# Patient Record
Sex: Female | Born: 1960 | Race: White | Hispanic: No | Marital: Married | Smoking: Never smoker
Health system: Southern US, Community
[De-identification: ages and names within clinical notes are randomized; demographics above are authoritative.]

## PROBLEM LIST (undated history)

## (undated) DIAGNOSIS — C801 Malignant (primary) neoplasm, unspecified: Secondary | ICD-10-CM

---

## 2009-02-18 ENCOUNTER — Emergency Department (HOSPITAL_COMMUNITY): Admission: EM | Admit: 2009-02-18 | Discharge: 2009-02-18 | Payer: Self-pay | Admitting: Emergency Medicine

## 2011-03-15 LAB — DIFFERENTIAL
Basophils Absolute: 0.1 10*3/uL (ref 0.0–0.1)
Basophils Relative: 1 % (ref 0–1)
Eosinophils Absolute: 0 10*3/uL (ref 0.0–0.7)
Eosinophils Relative: 0 % (ref 0–5)
Lymphocytes Relative: 26 % (ref 12–46)
Lymphs Abs: 1.7 10*3/uL (ref 0.7–4.0)
Monocytes Absolute: 0.4 10*3/uL (ref 0.1–1.0)
Monocytes Relative: 6 % (ref 3–12)
Neutro Abs: 4.5 10*3/uL (ref 1.7–7.7)
Neutrophils Relative %: 67 % (ref 43–77)

## 2011-03-15 LAB — BASIC METABOLIC PANEL
BUN: 6 mg/dL (ref 6–23)
CO2: 27 mEq/L (ref 19–32)
Calcium: 8.7 mg/dL (ref 8.4–10.5)
Chloride: 101 mEq/L (ref 96–112)
Creatinine, Ser: 0.71 mg/dL (ref 0.4–1.2)
GFR calc Af Amer: >60 mL/min (ref >60)
GFR calc non Af Amer: >60 mL/min (ref >60)
Glucose, Bld: 84 mg/dL (ref 70–99)
Potassium: 3.4 mEq/L — ABNORMAL LOW (ref 3.5–5.1)
Sodium: 135 mEq/L (ref 135–145)

## 2011-03-15 LAB — CBC
HCT: 25.9 % — ABNORMAL LOW (ref 36.0–46.0)
Hemoglobin: 7.9 g/dL — CL (ref 12.0–15.0)
MCHC: 30.5 g/dL (ref 30.0–36.0)
MCV: 60.2 fL — ABNORMAL LOW (ref 78.0–100.0)
Platelets: 368 10*3/uL (ref 150–400)
RBC: 4.3 MIL/uL (ref 3.87–5.11)
RDW: 21.4 % — ABNORMAL HIGH (ref 11.5–15.5)
WBC: 6.7 10*3/uL (ref 4.0–10.5)

## 2011-03-15 LAB — POCT CARDIAC MARKERS
CKMB, poc: 1.3 ng/mL (ref 1.0–8.0)
Myoglobin, poc: 85.3 ng/mL (ref 12–200)
Troponin i, poc: <0.05 ng/mL (ref 0.00–0.09)

## 2013-08-24 ENCOUNTER — Emergency Department (HOSPITAL_COMMUNITY)
Admission: EM | Admit: 2013-08-24 | Discharge: 2013-08-24 | Disposition: A | Discharge status: Home / Self Care | Payer: Self-pay | Source: Home / Self Care

## 2013-08-24 ENCOUNTER — Encounter (HOSPITAL_COMMUNITY): Payer: Self-pay | Admitting: Emergency Medicine

## 2013-08-24 ENCOUNTER — Emergency Department (INDEPENDENT_AMBULATORY_CARE_PROVIDER_SITE_OTHER): Payer: Self-pay

## 2013-08-24 DIAGNOSIS — H8110 Benign paroxysmal vertigo, unspecified ear: Secondary | ICD-10-CM

## 2013-08-24 DIAGNOSIS — J209 Acute bronchitis, unspecified: Secondary | ICD-10-CM

## 2013-08-24 DIAGNOSIS — J180 Bronchopneumonia, unspecified organism: Secondary | ICD-10-CM

## 2013-08-24 MED ORDER — BECLOMETHASONE DIPROPIONATE 80 MCG/ACT IN AERS: INHALATION_SPRAY | RESPIRATORY_TRACT | Status: DC

## 2013-08-24 MED ORDER — MOXIFLOXACIN HCL 400 MG PO TABS: 400.0000 mg | ORAL_TABLET | Freq: Every day | ORAL | Status: DC

## 2013-08-24 MED ORDER — ALBUTEROL SULFATE (5 MG/ML) 0.5% IN NEBU
INHALATION_SOLUTION | RESPIRATORY_TRACT | Status: AC
  Filled 2013-08-24: qty 1

## 2013-08-24 MED ORDER — ALBUTEROL SULFATE HFA 108 (90 BASE) MCG/ACT IN AERS: 1.0000 | INHALATION_SPRAY | Freq: Four times a day (QID) | RESPIRATORY_TRACT | Status: DC | PRN

## 2013-08-24 MED ORDER — ALBUTEROL SULFATE (5 MG/ML) 0.5% IN NEBU
5.0000 mg | INHALATION_SOLUTION | Freq: Once | RESPIRATORY_TRACT | Status: AC
  Administered 2013-08-24: 5 mg via RESPIRATORY_TRACT

## 2013-08-24 MED ORDER — IPRATROPIUM BROMIDE 0.02 % IN SOLN
0.5000 mg | Freq: Once | RESPIRATORY_TRACT | Status: AC
  Administered 2013-08-24: 0.5 mg via RESPIRATORY_TRACT

## 2013-08-24 NOTE — ED Notes (Signed)
C/o cough for three weeks.  Patient says she woke up today with the room spinning which made patient nausea.  Motion sickness medication taken.

## 2013-08-24 NOTE — ED Provider Notes (Signed)
CSN: [id number]     Arrival date & time 08/24/13  1236 History   First MD Initiated Contact with Patient 08/24/13 1515     Chief Complaint  Patient presents with  . Cough   (Consider location/radiation/quality/duration/timing/severity/associated sxs/prior Treatment) HPI Comments: 52 year old female presents with a complaint of cough for 3 weeks. The cough developed shortly after arrival to [place] after spending some time in [place] in August. She states there has been scant yellow sputum. She has mild shortness of breath particularly with exertion. Initially, she states that she had some fatigue and malaise associated with a temperature of 101.5 which was short lived. Her second complaint is that this morning upon awakening she experienced severe vertigo in which the room was spinning. She felt miserable had nausea and vomiting. She was able to take some Bonine from her son which helped and her vertigo has ameliorated.   History reviewed. No pertinent past medical history. No past surgical history on file. No family history on file. History  Substance Use Topics  . Smoking status: Not on file  . Smokeless tobacco: Not on file  . Alcohol Use: Not on file   OB History   Grav Para Term Preterm Abortions TAB SAB Ect Mult Living                 Review of Systems  Constitutional: Positive for activity change and fatigue. Negative for fever.  HENT: Negative for ear pain, congestion, sore throat, rhinorrhea, neck pain, postnasal drip and ear discharge.   Respiratory: Positive for cough and shortness of breath. Negative for wheezing.   Cardiovascular: Negative for chest pain, palpitations and leg swelling.  Gastrointestinal: Positive for nausea and vomiting.       Much of her GI symptoms have abated since arrival to the urgent care.  Genitourinary: Negative.   Skin: Negative for rash.  Neurological: Positive for dizziness and light-headedness. Negative for tremors, seizures,  syncope and speech difficulty.    Allergies  Review of patient's allergies indicates no known allergies.  Home Medications   Current Outpatient Rx  Name  Route  Sig  Dispense  Refill  . albuterol (PROVENTIL HFA;VENTOLIN HFA) 108 (90 BASE) MCG/ACT inhaler   Inhalation   Inhale 1-2 puffs into the lungs every 6 (six) hours as needed for wheezing.   1 Inhaler   0   . beclomethasone (QVAR) 80 MCG/ACT inhaler      INhale One puff into lungs daily   1 Inhaler   0   . moxifloxacin (AVELOX) 400 MG tablet   Oral   Take 1 tablet (400 mg total) by mouth daily.   7 tablet   0    BP 143/96  Pulse 66  Temp(Src) 98.1 F (36.7 C) (Oral)  Resp 18  SpO2 100% Physical Exam  Nursing note and vitals reviewed. Constitutional: She is oriented to person, place, and time. She appears well-developed and well-nourished. No distress.  Does not appear acutely ill. Smiling, energetic speech. No respiratory difficulty or cough during the exam  HENT:  Head: Normocephalic and atraumatic.  Mouth/Throat: Oropharynx is clear and moist. No oropharyngeal exudate.  Bilateral TMs are normal   Eyes: Conjunctivae and EOM are normal. Pupils are equal, round, and reactive to light.  No nystagmus  Neck: Normal range of motion. Neck supple.  Cardiovascular: Normal rate and normal heart sounds.   Pulmonary/Chest: Effort normal. No respiratory distress. She has wheezes.   Diffuse bilateral coarseness and wheezing.  Abdominal:  Soft. There is no tenderness.  Musculoskeletal: Normal range of motion.  Lymphadenopathy:    She has no cervical adenopathy.  Neurological: She is alert and oriented to person, place, and time. No cranial nerve deficit. She exhibits normal muscle tone.  Skin: Skin is warm and dry. No rash noted.  Psychiatric: She has a normal mood and affect.    ED Course  Procedures (including critical care time) Labs Review Labs Reviewed - No data to display Imaging Review Dg Chest 2  View  08/24/2013   CLINICAL DATA:  52 year old female with cough. Recent travel to [place].  EXAM: CHEST  2 VIEW  COMPARISON:  Chest CTA 02/18/2009.  FINDINGS: Focal airspace opacity at the right lung base appears very similar to that depicted on the 02/18/2009 study. Cardiac and mediastinal contours also appear stable, except that the right peritracheal density has diminished. No pneumothorax or pulmonary edema. No pleural effusion or other confluent pulmonary opacity. No acute osseous abnormality identified. Visualized tracheal air column is within normal limits.  IMPRESSION: 1. Focal airspace opacity at the right lung base, very similar to that depicted on 02/18/2009 CTA. Therefore chronic right lung inflammation/organizing pneumonia is possible, but an acute bronchopneumonia cannot be excluded.  2. Right peritracheal density appears decreased from the prior study which demonstrated a right peritracheal cystic lesion. Was there interval treatment of the lesion?  3. Overall, consider followup chest CT (IV contrast preferred) for further evaluation in this setting.  These results will be called to the ordering clinician or representative by the Radiologist Assistant, and communication documented in the PACS Dashboard.   Electronically Signed   By: Augusto Gamble M.D.   On: 08/24/2013 16:18    MDM   1. Bronchospasm with bronchitis, acute   2. Bronchopneumonia   3. BPV (benign positional vertigo)      Albuterol HFA 2 puffs q. 4 hours when necessary cough and wheezing Avelox 400 mg daily for 7 days Qvar 80 mg one inhalation daily Strongly recommended followup with PCP in 2 weeks. Call the above numbers to establish with the practice. For any new symptoms problems or worsening seek medical attention promptly. Per radiologist findings suggest that a chest CT may be indicated based on x-ray findings. We will also need a repeat chest x-ray in 2 weeks to follow progress of acute inflammatory changes  today. Patient is discharged in a stable and improved condition. Post duo neb Her lungs are clear with good air movement and no adventitious sounds.   Hayden Rasmussen, NP 08/24/13 1644

## 2013-08-26 NOTE — ED Provider Notes (Signed)
Medical screening examination/treatment/procedure(s) were performed by resident physician or non-physician practitioner and as supervising physician I was immediately available for consultation/collaboration.   KINDL,JAMES DOUGLAS MD.   James D Kindl, MD 08/26/13 1517 

## 2020-04-23 ENCOUNTER — Encounter (HOSPITAL_COMMUNITY): Payer: Self-pay

## 2020-04-23 ENCOUNTER — Emergency Department (HOSPITAL_COMMUNITY): Payer: 59

## 2020-04-23 ENCOUNTER — Other Ambulatory Visit: Payer: Self-pay

## 2020-04-23 ENCOUNTER — Inpatient Hospital Stay (HOSPITAL_COMMUNITY)
Admission: EM | Admit: 2020-04-23 | Discharge: 2020-04-26 | DRG: 919 | Disposition: A | Discharge status: Home / Self Care | Payer: 59 | Attending: Family Medicine | Admitting: Family Medicine

## 2020-04-23 DIAGNOSIS — T85628A Displacement of other specified internal prosthetic devices, implants and grafts, initial encounter: Secondary | ICD-10-CM | POA: Diagnosis not present

## 2020-04-23 DIAGNOSIS — C7951 Secondary malignant neoplasm of bone: Secondary | ICD-10-CM | POA: Diagnosis present

## 2020-04-23 DIAGNOSIS — J90 Pleural effusion, not elsewhere classified: Secondary | ICD-10-CM

## 2020-04-23 DIAGNOSIS — K759 Inflammatory liver disease, unspecified: Secondary | ICD-10-CM | POA: Diagnosis present

## 2020-04-23 DIAGNOSIS — R0781 Pleurodynia: Secondary | ICD-10-CM

## 2020-04-23 DIAGNOSIS — C3490 Malignant neoplasm of unspecified part of unspecified bronchus or lung: Secondary | ICD-10-CM

## 2020-04-23 DIAGNOSIS — C787 Secondary malignant neoplasm of liver and intrahepatic bile duct: Secondary | ICD-10-CM

## 2020-04-23 DIAGNOSIS — Z7951 Long term (current) use of inhaled steroids: Secondary | ICD-10-CM

## 2020-04-23 DIAGNOSIS — T859XXA Unspecified complication of internal prosthetic device, implant and graft, initial encounter: Secondary | ICD-10-CM | POA: Diagnosis not present

## 2020-04-23 DIAGNOSIS — R109 Unspecified abdominal pain: Secondary | ICD-10-CM | POA: Diagnosis present

## 2020-04-23 DIAGNOSIS — C7931 Secondary malignant neoplasm of brain: Secondary | ICD-10-CM | POA: Diagnosis present

## 2020-04-23 DIAGNOSIS — Z888 Allergy status to other drugs, medicaments and biological substances status: Secondary | ICD-10-CM

## 2020-04-23 DIAGNOSIS — Z79899 Other long term (current) drug therapy: Secondary | ICD-10-CM

## 2020-04-23 DIAGNOSIS — C3491 Malignant neoplasm of unspecified part of right bronchus or lung: Secondary | ICD-10-CM

## 2020-04-23 DIAGNOSIS — E871 Hypo-osmolality and hyponatremia: Secondary | ICD-10-CM | POA: Diagnosis present

## 2020-04-23 DIAGNOSIS — D739 Disease of spleen, unspecified: Secondary | ICD-10-CM | POA: Diagnosis present

## 2020-04-23 DIAGNOSIS — C799 Secondary malignant neoplasm of unspecified site: Secondary | ICD-10-CM | POA: Diagnosis present

## 2020-04-23 DIAGNOSIS — R21 Rash and other nonspecific skin eruption: Secondary | ICD-10-CM | POA: Diagnosis present

## 2020-04-23 DIAGNOSIS — Z20822 Contact with and (suspected) exposure to covid-19: Secondary | ICD-10-CM | POA: Diagnosis present

## 2020-04-23 DIAGNOSIS — R58 Hemorrhage, not elsewhere classified: Secondary | ICD-10-CM | POA: Diagnosis present

## 2020-04-23 DIAGNOSIS — S20211A Contusion of right front wall of thorax, initial encounter: Secondary | ICD-10-CM | POA: Diagnosis present

## 2020-04-23 DIAGNOSIS — C7802 Secondary malignant neoplasm of left lung: Secondary | ICD-10-CM | POA: Diagnosis present

## 2020-04-23 DIAGNOSIS — I2699 Other pulmonary embolism without acute cor pulmonale: Secondary | ICD-10-CM

## 2020-04-23 DIAGNOSIS — J910 Malignant pleural effusion: Secondary | ICD-10-CM | POA: Diagnosis present

## 2020-04-23 LAB — CBC
HCT: 40.7 % (ref 36.0–46.0)
Hemoglobin: 12.9 g/dL (ref 12.0–15.0)
MCH: 29.4 pg (ref 26.0–34.0)
MCHC: 31.7 g/dL (ref 30.0–36.0)
MCV: 92.7 fL (ref 80.0–100.0)
Platelets: 431 10*3/uL — ABNORMAL HIGH (ref 150–400)
RBC: 4.39 MIL/uL (ref 3.87–5.11)
RDW: 13.1 % (ref 11.5–15.5)
WBC: 6.6 10*3/uL (ref 4.0–10.5)
nRBC: 0 % (ref 0.0–0.2)

## 2020-04-23 LAB — APTT: aPTT: 34 seconds (ref 24–36)

## 2020-04-23 LAB — COMPREHENSIVE METABOLIC PANEL
ALT: 88 U/L — ABNORMAL HIGH (ref 0–44)
AST: 59 U/L — ABNORMAL HIGH (ref 15–41)
Albumin: 2.5 g/dL — ABNORMAL LOW (ref 3.5–5.0)
Alkaline Phosphatase: 334 U/L — ABNORMAL HIGH (ref 38–126)
Anion gap: 7 (ref 5–15)
BUN: 10 mg/dL (ref 6–20)
CO2: 29 mmol/L (ref 22–32)
Calcium: 7.9 mg/dL — ABNORMAL LOW (ref 8.9–10.3)
Chloride: 98 mmol/L (ref 98–111)
Creatinine, Ser: 0.91 mg/dL (ref 0.44–1.00)
GFR calc Af Amer: >60 mL/min (ref >60)
GFR calc non Af Amer: >60 mL/min (ref >60)
Glucose, Bld: 101 mg/dL — ABNORMAL HIGH (ref 70–99)
Potassium: 3.9 mmol/L (ref 3.5–5.1)
Sodium: 134 mmol/L — ABNORMAL LOW (ref 135–145)
Total Bilirubin: 0.7 mg/dL (ref 0.3–1.2)
Total Protein: 5.6 g/dL — ABNORMAL LOW (ref 6.5–8.1)

## 2020-04-23 LAB — PROTIME-INR
INR: 1.2 (ref 0.8–1.2)
Prothrombin Time: 14.3 seconds (ref 11.4–15.2)

## 2020-04-23 LAB — TROPONIN I (HIGH SENSITIVITY): Troponin I (High Sensitivity): 8 ng/L (ref <18)

## 2020-04-23 LAB — LACTIC ACID, PLASMA: Lactic Acid, Venous: 1.3 mmol/L (ref 0.5–1.9)

## 2020-04-23 MED ORDER — APIXABAN 5 MG PO TABS
10.00 | ORAL_TABLET | ORAL | Status: DC
Start: 2020-04-21 — End: 2020-04-23

## 2020-04-23 MED ORDER — FAMOTIDINE 20 MG PO TABS
40.00 | ORAL_TABLET | ORAL | Status: DC
Start: 2020-04-21 — End: 2020-04-23

## 2020-04-23 MED ORDER — SODIUM CHLORIDE (PF) 0.9 % IJ SOLN
INTRAMUSCULAR | Status: AC
  Filled 2020-04-23: qty 50

## 2020-04-23 MED ORDER — PANTOPRAZOLE SODIUM 40 MG PO TBEC
40.00 | DELAYED_RELEASE_TABLET | ORAL | Status: DC
Start: 2020-04-22 — End: 2020-04-23

## 2020-04-23 MED ORDER — OXYCODONE HCL 5 MG PO TABS
5.00 | ORAL_TABLET | ORAL | Status: DC
Start: ? — End: 2020-04-23

## 2020-04-23 MED ORDER — BENZONATATE 100 MG PO CAPS
100.00 | ORAL_CAPSULE | ORAL | Status: DC
Start: ? — End: 2020-04-23

## 2020-04-23 MED ORDER — HYDROMORPHONE HCL 1 MG/ML IJ SOLN
1.0000 mg | Freq: Once | INTRAMUSCULAR | Status: AC
  Administered 2020-04-23: 1 mg via INTRAVENOUS
  Filled 2020-04-23: qty 1

## 2020-04-23 MED ORDER — ONDANSETRON 4 MG PO TBDP
4.00 | ORAL_TABLET | ORAL | Status: DC
Start: ? — End: 2020-04-23

## 2020-04-23 NOTE — ED Triage Notes (Signed)
Pt has a R side pleurex drain in place d/t a collapsed R lung. She states that the area has been swelling and bleeding. Cancer center recommended that she come in immediately. Reports increased SOB. Dx'd on 5/3. Takes oral chemo daily.

## 2020-04-23 NOTE — ED Provider Notes (Signed)
Patient signed out to me at shift change.  Patient with recently diagnosed lung cancer.  Had Pleurx catheter placed at [facility] in right chest wall.  Reports that she has had increasing swelling and bruising underneath the skin surrounding this location.  Thought to be more related to bleeding secondary to new anticoagulation rather than infection.  Patient is pending CT imaging for further evaluation.  CT shows malpositioned Pleurx catheter.  The bruising and changes to the right chest wall and flank are thought to be related to extravasation from the malpositioned Pleurx catheter.  It does not look infectious on my exam.  She does not have an elevated white blood cell count, she has a normal lactic acid, and her vital signs been stable.  She has been afebrile.  I discussed the case with her pulmonologist, Dr. Oletta Darter, who recommended that the Pleurx catheter be replaced and the patient be observed in the hospital secondary to the extravasation.  I discussed the case with Dr. Gloriann Loan, from [facility], who will admit the patient.  Appreciate his help.  Patient is working towards transitioning her care to [facility].  She does not want to be sent back to [facility], Herbie Baltimore, PA-C 04/24/20 0246    Ward, Delice Bison, DO 04/24/20 [phone]

## 2020-04-23 NOTE — ED Provider Notes (Signed)
[facility] Emergency Department Provider Note MRN:  [id number]  Arrival date & time: 04/23/20     Chief Complaint   Lung Cancer (Pleurex drain swollen and bleeding)   History of Present Illness   Brittany Mccarty is a 59 y.o. year-old female with a history of lung cancer presenting to the ED with chief complaint of lung cancer.  Patient was recently diagnosed with lung cancer.  She had cancerous fluid in her right pleural space and a catheter was placed at [facility].  For the past few days she has noticed increased pain, swelling, bruising underneath the skin surrounding this area.  She was advised to come straight to the nearest emergency department after calling her care team.  She explains that since starting chemotherapy her pain overall is improved.  Currently her pain is mild, she denies shortness of breath, no fever, no abdominal pain.  Review of Systems  A complete 10 system review of systems was obtained and all systems are negative except as noted in the HPI and PMH.   Patient's Health History   History reviewed. No pertinent past medical history.  History reviewed. No pertinent surgical history.  History reviewed. No pertinent family history.  Social History   Socioeconomic History  . Marital status: Married    Spouse name: Not on file  . Number of children: Not on file  . Years of education: Not on file  . Highest education level: Not on file  Occupational History  . Not on file  Tobacco Use  . Smoking status: Never Smoker  . Smokeless tobacco: Never Used  Substance and Sexual Activity  . Alcohol use: Not on file  . Drug use: Not on file  . Sexual activity: Not on file  Other Topics Concern  . Not on file  Social History Narrative  . Not on file   Social Determinants of Health   Financial Resource Strain:   . Difficulty of Paying Living Expenses:   Food Insecurity:   . Worried About Charity fundraiser in the  Last Year:   . Arboriculturist in the Last Year:   Transportation Needs:   . Film/video editor (Medical):   Marland Kitchen Lack of Transportation (Non-Medical):   Physical Activity:   . Days of Exercise per Week:   . Minutes of Exercise per Session:   Stress:   . Feeling of Stress :   Social Connections:   . Frequency of Communication with Friends and Family:   . Frequency of Social Gatherings with Friends and Family:   . Attends Religious Services:   . Active Member of Clubs or Organizations:   . Attends Archivist Meetings:   Marland Kitchen Marital Status:   Intimate Partner Violence:   . Fear of Current or Ex-Partner:   . Emotionally Abused:   Marland Kitchen Physically Abused:   . Sexually Abused:      Physical Exam   Vitals:   04/23/20 2018  BP: 129/89  Pulse: (!) 103  Resp: (!) 28  Temp: 98.5 F (36.9 C)  SpO2: 100%    CONSTITUTIONAL: Well-appearing, NAD NEURO:  Alert and oriented x 3, no focal deficits EYES:  eyes equal and reactive ENT/NECK:  no LAD, no JVD CARDIO: Regular rate, well-perfused, normal S1 and S2 PULM:  CTAB no wheezing or rhonchi GI/GU:  normal bowel sounds, non-distended, non-tender MSK/SPINE:  No gross deformities, no edema SKIN: Diffuse erythema to the right chest, right upper abdomen,  right breast PSYCH:  Appropriate speech and behavior  *Additional and/or pertinent findings included in MDM below  Diagnostic and Interventional Summary    EKG Interpretation  Date/Time:  Saturday Apr 23 2020 21:55:09 EDT Ventricular Rate:  85 PR Interval:    QRS Duration: 95 QT Interval:  361 QTC Calculation: 430 R Axis:   15 Text Interpretation: Sinus rhythm Abnormal R-wave progression, early transition Confirmed by Gerlene Fee [phone]) on 04/23/2020 10:03:58 PM      Labs Reviewed  CBC - Abnormal; Notable for the following components:      Result Value   Platelets 431 (*)    All other components within normal limits  COMPREHENSIVE METABOLIC PANEL  LACTIC ACID,  PLASMA  PROTIME-INR  APTT  TROPONIN I (HIGH SENSITIVITY)    DG Chest Port 1 View  Final Result    CT CHEST W CONTRAST    (Results Pending)  CT ABDOMEN PELVIS W CONTRAST    (Results Pending)    Medications - No data to display   Procedures  /  Critical Care Procedures  ED Course and Medical Decision Making  I have reviewed the triage vital signs, the nursing notes, and pertinent available records from the EMR.  Listed above are laboratory and imaging tests that I personally ordered, reviewed, and interpreted and then considered in my medical decision making (see below for details).      Concern for either bleeding or infection of this pleural catheter site.  Per chart review patient was also diagnosed with a pulmonary embolism and has been taking Eliquis for the past 4 days.  She is currently hemodynamically stable, overall well-appearing, mildly tachypneic.  Will begin with portable x-ray to ensure no pneumothorax.  There is documentation that patient's pleural catheter placement was complicated by pneumothorax that required hospitalization.  The x-ray will also be able to determine any sizable reaccumulation of effusion.  However I feel patient will need CT imaging to evaluate for any signs of active extravasation.  Patient would prefer to stay in our network given that [facility] is not covered by her insurance and she has been paying out-of-pocket, but she may need transfer to [facility] given the complexity.  Awaiting CT imaging, x-ray is overall reassuring.  Signed out to oncoming provider at shift change.  Barth Kirks. Sedonia Small, Zeb [email or web address]  Final Clinical Impressions(s) / ED Diagnoses     ICD-10-CM   1. Rash  R21   2. Complication of chest tube, initial encounter  [email or web address]     ED Discharge Orders    None       Discharge Instructions Discussed with and Provided to Patient:   Discharge Instructions    None       Maudie Flakes, MD 04/23/20 2204

## 2020-04-24 ENCOUNTER — Other Ambulatory Visit (HOSPITAL_COMMUNITY): Payer: 59

## 2020-04-24 ENCOUNTER — Encounter (HOSPITAL_COMMUNITY): Payer: Self-pay

## 2020-04-24 ENCOUNTER — Observation Stay (HOSPITAL_COMMUNITY): Payer: 59

## 2020-04-24 ENCOUNTER — Emergency Department (HOSPITAL_COMMUNITY): Payer: 59

## 2020-04-24 DIAGNOSIS — I2699 Other pulmonary embolism without acute cor pulmonale: Secondary | ICD-10-CM | POA: Diagnosis present

## 2020-04-24 DIAGNOSIS — R0781 Pleurodynia: Secondary | ICD-10-CM | POA: Diagnosis not present

## 2020-04-24 DIAGNOSIS — K759 Inflammatory liver disease, unspecified: Secondary | ICD-10-CM | POA: Diagnosis present

## 2020-04-24 DIAGNOSIS — C3491 Malignant neoplasm of unspecified part of right bronchus or lung: Secondary | ICD-10-CM | POA: Diagnosis present

## 2020-04-24 DIAGNOSIS — Z20822 Contact with and (suspected) exposure to covid-19: Secondary | ICD-10-CM | POA: Diagnosis present

## 2020-04-24 DIAGNOSIS — J910 Malignant pleural effusion: Secondary | ICD-10-CM | POA: Diagnosis present

## 2020-04-24 DIAGNOSIS — C7951 Secondary malignant neoplasm of bone: Secondary | ICD-10-CM | POA: Diagnosis present

## 2020-04-24 DIAGNOSIS — Z888 Allergy status to other drugs, medicaments and biological substances status: Secondary | ICD-10-CM | POA: Diagnosis not present

## 2020-04-24 DIAGNOSIS — Z79899 Other long term (current) drug therapy: Secondary | ICD-10-CM | POA: Diagnosis not present

## 2020-04-24 DIAGNOSIS — C7802 Secondary malignant neoplasm of left lung: Secondary | ICD-10-CM | POA: Diagnosis present

## 2020-04-24 DIAGNOSIS — R109 Unspecified abdominal pain: Secondary | ICD-10-CM | POA: Diagnosis present

## 2020-04-24 DIAGNOSIS — C799 Secondary malignant neoplasm of unspecified site: Secondary | ICD-10-CM | POA: Diagnosis present

## 2020-04-24 DIAGNOSIS — T85628A Displacement of other specified internal prosthetic devices, implants and grafts, initial encounter: Secondary | ICD-10-CM | POA: Diagnosis present

## 2020-04-24 DIAGNOSIS — E871 Hypo-osmolality and hyponatremia: Secondary | ICD-10-CM | POA: Diagnosis present

## 2020-04-24 DIAGNOSIS — S20211A Contusion of right front wall of thorax, initial encounter: Secondary | ICD-10-CM | POA: Diagnosis present

## 2020-04-24 DIAGNOSIS — Z7951 Long term (current) use of inhaled steroids: Secondary | ICD-10-CM | POA: Diagnosis not present

## 2020-04-24 DIAGNOSIS — T859XXA Unspecified complication of internal prosthetic device, implant and graft, initial encounter: Secondary | ICD-10-CM | POA: Diagnosis present

## 2020-04-24 DIAGNOSIS — C7931 Secondary malignant neoplasm of brain: Secondary | ICD-10-CM | POA: Diagnosis present

## 2020-04-24 DIAGNOSIS — C3490 Malignant neoplasm of unspecified part of unspecified bronchus or lung: Secondary | ICD-10-CM

## 2020-04-24 DIAGNOSIS — R58 Hemorrhage, not elsewhere classified: Secondary | ICD-10-CM | POA: Diagnosis present

## 2020-04-24 DIAGNOSIS — C787 Secondary malignant neoplasm of liver and intrahepatic bile duct: Secondary | ICD-10-CM

## 2020-04-24 DIAGNOSIS — R21 Rash and other nonspecific skin eruption: Secondary | ICD-10-CM | POA: Diagnosis present

## 2020-04-24 DIAGNOSIS — D739 Disease of spleen, unspecified: Secondary | ICD-10-CM | POA: Diagnosis present

## 2020-04-24 HISTORY — PX: IR REMOVAL OF PLURAL CATH W/CUFF: IMG5346

## 2020-04-24 LAB — BASIC METABOLIC PANEL
Anion gap: 10 (ref 5–15)
BUN: 9 mg/dL (ref 6–20)
CO2: 25 mmol/L (ref 22–32)
Calcium: 8.2 mg/dL — ABNORMAL LOW (ref 8.9–10.3)
Chloride: 102 mmol/L (ref 98–111)
Creatinine, Ser: 0.89 mg/dL (ref 0.44–1.00)
GFR calc Af Amer: >60 mL/min (ref >60)
GFR calc non Af Amer: >60 mL/min (ref >60)
Glucose, Bld: 102 mg/dL — ABNORMAL HIGH (ref 70–99)
Potassium: 3.9 mmol/L (ref 3.5–5.1)
Sodium: 137 mmol/L (ref 135–145)

## 2020-04-24 LAB — CBC
HCT: 38.5 % (ref 36.0–46.0)
Hemoglobin: 12.6 g/dL (ref 12.0–15.0)
MCH: 29.1 pg (ref 26.0–34.0)
MCHC: 32.7 g/dL (ref 30.0–36.0)
MCV: 88.9 fL (ref 80.0–100.0)
Platelets: 400 10*3/uL (ref 150–400)
RBC: 4.33 MIL/uL (ref 3.87–5.11)
RDW: 13.1 % (ref 11.5–15.5)
WBC: 6 10*3/uL (ref 4.0–10.5)
nRBC: 0 % (ref 0.0–0.2)

## 2020-04-24 LAB — HIV ANTIBODY (ROUTINE TESTING W REFLEX): HIV Screen 4th Generation wRfx: NONREACTIVE

## 2020-04-24 LAB — SARS CORONAVIRUS 2 BY RT PCR (HOSPITAL ORDER, PERFORMED IN ~~LOC~~ HOSPITAL LAB): SARS Coronavirus 2: NEGATIVE

## 2020-04-24 LAB — TROPONIN I (HIGH SENSITIVITY): Troponin I (High Sensitivity): 8 ng/L (ref <18)

## 2020-04-24 LAB — APTT
aPTT: 34 seconds (ref 24–36)
aPTT: 38 seconds — ABNORMAL HIGH (ref 24–36)

## 2020-04-24 LAB — HEPARIN LEVEL (UNFRACTIONATED)
Heparin Unfractionated: 0.64 IU/mL (ref 0.30–0.70)
Heparin Unfractionated: 0.4 IU/mL (ref 0.30–0.70)

## 2020-04-24 MED ORDER — HYDROMORPHONE HCL 1 MG/ML IJ SOLN
1.0000 mg | INTRAMUSCULAR | Status: DC | PRN
  Administered 2020-04-24 (×2): 1 mg via INTRAVENOUS
  Filled 2020-04-24 (×4): qty 1

## 2020-04-24 MED ORDER — VANCOMYCIN HCL 1500 MG/300ML IV SOLN
1500.0000 mg | Freq: Once | INTRAVENOUS | Status: AC
  Administered 2020-04-24: 1500 mg via INTRAVENOUS
  Filled 2020-04-24: qty 300

## 2020-04-24 MED ORDER — FAMOTIDINE 20 MG PO TABS
40.0000 mg | ORAL_TABLET | Freq: Two times a day (BID) | ORAL | Status: DC
  Administered 2020-04-24 – 2020-04-25 (×4): 40 mg via ORAL
  Filled 2020-04-24 (×5): qty 2

## 2020-04-24 MED ORDER — VANCOMYCIN HCL IN DEXTROSE 1-5 GM/200ML-% IV SOLN: 1000.0000 mg | INTRAVENOUS | Status: DC

## 2020-04-24 MED ORDER — HYDROMORPHONE HCL 1 MG/ML IJ SOLN
0.5000 mg | INTRAMUSCULAR | Status: DC | PRN
  Administered 2020-04-24 (×3): 0.5 mg via INTRAVENOUS
  Filled 2020-04-24 (×3): qty 0.5

## 2020-04-24 MED ORDER — HEPARIN (PORCINE) 25000 UT/250ML-% IV SOLN
1400.0000 [IU]/h | INTRAVENOUS | Status: AC
  Administered 2020-04-25: 1400 [IU]/h via INTRAVENOUS
  Filled 2020-04-24 (×2): qty 250

## 2020-04-24 MED ORDER — OXYCODONE-ACETAMINOPHEN 7.5-325 MG PO TABS
1.0000 | ORAL_TABLET | Freq: Four times a day (QID) | ORAL | Status: DC | PRN
  Administered 2020-04-24: 1 via ORAL
  Filled 2020-04-24: qty 1

## 2020-04-24 MED ORDER — ALBUTEROL SULFATE (2.5 MG/3ML) 0.083% IN NEBU: 2.5000 mg | INHALATION_SOLUTION | RESPIRATORY_TRACT | Status: DC | PRN

## 2020-04-24 MED ORDER — HYDROMORPHONE HCL 1 MG/ML IJ SOLN
0.5000 mg | INTRAMUSCULAR | Status: DC | PRN
  Administered 2020-04-25: 0.5 mg via INTRAVENOUS
  Filled 2020-04-24: qty 0.5

## 2020-04-24 MED ORDER — ACETAMINOPHEN 325 MG PO TABS: 650.0000 mg | ORAL_TABLET | Freq: Four times a day (QID) | ORAL | Status: DC | PRN

## 2020-04-24 MED ORDER — POLYETHYLENE GLYCOL 3350 17 G PO PACK: 17.0000 g | PACK | Freq: Every day | ORAL | Status: DC | PRN

## 2020-04-24 MED ORDER — ONDANSETRON HCL 4 MG PO TABS: 4.0000 mg | ORAL_TABLET | Freq: Three times a day (TID) | ORAL | Status: DC | PRN

## 2020-04-24 MED ORDER — OXYCODONE HCL 5 MG PO TABS
10.0000 mg | ORAL_TABLET | ORAL | Status: DC | PRN
  Administered 2020-04-24 – 2020-04-25 (×7): 10 mg via ORAL
  Administered 2020-04-26: 5 mg via ORAL
  Administered 2020-04-26 (×2): 10 mg via ORAL
  Filled 2020-04-24 (×10): qty 2

## 2020-04-24 MED ORDER — FOLIC ACID 1 MG PO TABS
1.0000 mg | ORAL_TABLET | Freq: Every day | ORAL | Status: DC
  Filled 2020-04-24 (×3): qty 1

## 2020-04-24 MED ORDER — SODIUM CHLORIDE 0.9 % IV SOLN
3.0000 g | Freq: Four times a day (QID) | INTRAVENOUS | Status: DC
  Administered 2020-04-24 – 2020-04-26 (×8): 3 g via INTRAVENOUS
  Filled 2020-04-24 (×2): qty 3
  Filled 2020-04-24 (×2): qty 8
  Filled 2020-04-24 (×2): qty 3
  Filled 2020-04-24: qty 8
  Filled 2020-04-24: qty 3
  Filled 2020-04-24: qty 8

## 2020-04-24 MED ORDER — HEPARIN (PORCINE) 25000 UT/250ML-% IV SOLN
1000.0000 [IU]/h | INTRAVENOUS | Status: DC
  Administered 2020-04-24: 1000 [IU]/h via INTRAVENOUS
  Filled 2020-04-24: qty 250

## 2020-04-24 MED ORDER — BENZONATATE 100 MG PO CAPS: 100.0000 mg | ORAL_CAPSULE | Freq: Three times a day (TID) | ORAL | Status: DC | PRN

## 2020-04-24 MED ORDER — OXYCODONE HCL 5 MG PO TABS: 5.0000 mg | ORAL_TABLET | ORAL | Status: DC | PRN

## 2020-04-24 MED ORDER — ACETAMINOPHEN 650 MG RE SUPP: 650.0000 mg | Freq: Four times a day (QID) | RECTAL | Status: DC | PRN

## 2020-04-24 MED ORDER — LACTATED RINGERS IV SOLN: INTRAVENOUS | Status: AC

## 2020-04-24 MED ORDER — OSIMERTINIB MESYLATE 80 MG PO TABS
80.0000 mg | ORAL_TABLET | Freq: Every day | ORAL | Status: DC
  Administered 2020-04-24: 80 mg via ORAL

## 2020-04-24 MED ORDER — ACETAMINOPHEN 500 MG PO TABS
500.0000 mg | ORAL_TABLET | Freq: Four times a day (QID) | ORAL | Status: DC
  Administered 2020-04-24 – 2020-04-26 (×8): 500 mg via ORAL
  Filled 2020-04-24 (×8): qty 1

## 2020-04-24 MED ORDER — IOHEXOL 300 MG/ML  SOLN
100.0000 mL | Freq: Once | INTRAMUSCULAR | Status: AC | PRN
  Administered 2020-04-24: 100 mL via INTRAVENOUS

## 2020-04-24 NOTE — Progress Notes (Signed)
ANTICOAGULATION CONSULT NOTE - Initial Consult  Pharmacy Consult for Heparin Indication: pulmonary embolus  Allergies  Allergen Reactions  . Other     Blood Products  . Cefdinir Rash    Patient Measurements: Height: 5\' 4"  (162.6 cm) Weight: 83.7 kg (184 lb 9.6 oz) IBW/kg (Calculated) : 54.7 Heparin Dosing Weight:   Vital Signs: Temp: 98.5 F (36.9 C) (05/22 2018) Temp Source: Oral (05/22 2018) BP: 117/85 (05/23 0200) Pulse Rate: 89 (05/23 0200)  Labs: Recent Labs    04/23/20 2150  HGB 12.9  HCT 40.7  PLT 431*  APTT 34  LABPROT 14.3  INR 1.2  CREATININE 0.91  TROPONINIHS 8    Estimated Creatinine Clearance: 68.8 mL/min (by C-G formula based on SCr of 0.91 mg/dL).   Medical History: History reviewed. No pertinent past medical history.  Medications:  Infusions:  . heparin    . lactated ringers      Assessment: Patient with prior apixaban use with last dose noted 04/23/20 at 1030.   Goal of Therapy:  Heparin level 0.3-0.7 units/ml  PTT 66-102 sec Monitor platelets by anticoagulation protocol: Yes   Plan:  Heparin drip at 1000 units/hr Heparin level/PTT at 1300 CBC with AM levels  Brittany Mccarty 04/24/2020,3:46 AM

## 2020-04-24 NOTE — Progress Notes (Signed)
Pharmacy Antibiotic Note  Brittany Mccarty is a 59 y.o. 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  Pharmacy has been consulted for vancomycin and unasyn dosing for wound infection  Plan: Vancomycin 1500mg  IV x 1 then 1gm q24h AUC 454.5, Scr 0.89 unasyn 3gm IV q6h Follow renal function, cultures and clinical course  Height: 5\' 4"  (162.6 cm) Weight: 83.7 kg (184 lb 8.4 oz) IBW/kg (Calculated) : 54.7  Temp (24hrs), Avg:98.1 F (36.7 C), Min:97.8 F (36.6 C), Max:98.5 F (36.9 C)  Recent Labs  Lab 04/23/20 2150 04/24/20 0538  WBC 6.6 6.0  CREATININE 0.91 0.89  LATICACIDVEN 1.3  --     Estimated Creatinine Clearance: 70.4 mL/min (by C-G formula based on SCr of 0.89 mg/dL).    Allergies  Allergen Reactions  . Other     Blood Products  . Cefdinir Rash    Antimicrobials this admission: 5/23 vanc >> 5/23 unasyn >> Dose adjustments this admission:   Microbiology results:  Thank you for allowing pharmacy to be a part of this patient's care.  Dolly Rias RPh 04/24/2020, 12:26 PM

## 2020-04-24 NOTE — H&P (Addendum)
History and Physical    Brittany Mccarty WNU:[id number] DOB: 29-Jul-1961 DOA: 04/23/2020  Referring MD/NP/PA: Marlon Pel, PA PCP: Patient, No Pcp Per  Outpatient Specialists: Dr. Mindi Junker, oncology Patient coming from: Home  Chief Complaint: "My right side is swollen and painful"  HPI: Brittany Mccarty is a 59 y.o. 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  She reports having her Pleurx catheter placed on 5/17 with initially no complications however the following day she was started on anticoagulation with Eliquis for her pulmonary embolism.  She then noticed each time she drained her Pleurx catheter the fluid was bright red.  A couple of days later she started noticing increasing pain around her chest tube site along with significant bruising as well.  She states that yesterday she noticed her entire right side had turned purple.  She did want to go back to the emergency room and so she did not tell anybody about this until 5/22 when her daughter went to drain her Pleurx catheter.  When her daughter noticed all of the ecchymoses as well as the significant tenderness to minimal palpation they called [facility] who recommended the patient come to the closest emergency room.  She currently denies any difficulty breathing, nausea, vomiting, fever, chills, cough, dysuria.  She states that since starting her chemotherapy she actually feels much improved with less pain overall.  ED Course: In the ED 1 mg of IV Dilaudid for pain followed by CT of the chest abdomen pelvis.  CT revealed that her Pleurx catheter has one of the sidewalls located in the patient's chest wall leading to free fluid pockets of gas in this area.  No drainable fluid collection or abscess noted however cannot fully rule out infectious process although  this is most likely related to the malpositioned Pleurx catheter.  Given that the Pleurx catheter was placed at [facility] there was discussion regarding patient needing to be transferred there however she would like to transfer all her care to [facility] as her insurance does not cover [facility].  Reports not wanting to be sent back to [facility].  ED called pulmonology, Dr. Oletta Darter, who recommended admission and replacement of the Pleurx catheter.  Patient will be admitted to the hospital under the care of the hospitalist service with observation status for chest tube dysfunction and right-sided chest pain.  Review of Systems: As per HPI otherwise 10 point review of systems negative.    History reviewed. No pertinent past medical history.  History reviewed. No pertinent surgical history.   reports that she has never smoked. She has never used smokeless tobacco. No history on file for alcohol and drug.  Allergies  Allergen Reactions  . Other     Blood Products  . Cefdinir Rash    History reviewed. No pertinent family history. Father is 55 and has had a MI late in life. Mother deceased at 78, unclear etiology  Prior to Admission medications   Medication Sig Start Date End Date Taking? Authorizing Provider  ELIQUIS 5 MG TABS tablet Take 5 mg by mouth 2 (two) times daily.  04/19/20  Yes [provider]  famotidine (PEPCID) 40 MG tablet Take 40 mg by mouth 2 (two) times daily. 04/21/20  Yes [provider]  folic acid (FOLVITE) 1 MG tablet Take 1 mg by mouth daily. 04/11/20  Yes [provider]  osimertinib mesylate (TAGRISSO) 80 MG tablet Take 80 mg by mouth daily.  04/21/20  Yes [provider]  oxyCODONE-acetaminophen (PERCOCET) 7.5-325 MG tablet Take 1 tablet by mouth 4 (four) times daily as needed for moderate pain or severe pain.  04/08/20  Yes [provider]  albuterol (PROVENTIL HFA;VENTOLIN HFA) 108 (90 BASE) MCG/ACT inhaler Inhale 1-2 puffs  into the lungs every 6 (six) hours as needed for wheezing. Patient not taking: Reported on 04/23/2020 08/24/13   Janne Napoleon, NP  beclomethasone (QVAR) 80 MCG/ACT inhaler INhale One puff into lungs daily Patient not taking: Reported on 04/23/2020 08/24/13   Janne Napoleon, NP  benzonatate (TESSALON) 100 MG capsule Take 100 mg by mouth 3 (three) times daily as needed for cough.  04/21/20   [provider]  moxifloxacin (AVELOX) 400 MG tablet Take 1 tablet (400 mg total) by mouth daily. Patient not taking: Reported on 04/23/2020 08/24/13   Janne Napoleon, NP  ondansetron (ZOFRAN) 4 MG tablet Take 4 mg by mouth every 8 (eight) hours as needed for nausea or vomiting.  04/08/20   [provider]    Physical Exam: Vitals:   04/23/20 2300 04/24/20 0000 04/24/20 0100 04/24/20 0200  BP: 121/79 119/75 119/80 117/85  Pulse: 83 85 94 89  Resp: 11 (!) 24 (!) 21 11  Temp:      TempSrc:      SpO2: 98% 96% 99% 97%  Weight:      Height:          Constitutional: NAD, calm, comfortable Vitals:   04/23/20 2300 04/24/20 0000 04/24/20 0100 04/24/20 0200  BP: 121/79 119/75 119/80 117/85  Pulse: 83 85 94 89  Resp: 11 (!) 24 (!) 21 11  Temp:      TempSrc:      SpO2: 98% 96% 99% 97%  Weight:      Height:       Eyes: PERRL, lids and conjunctivae normal ENMT: Mucous membranes are moist. Posterior pharynx clear of any exudate or lesions.Normal dentition.  Neck: normal, supple, no masses Respiratory: Decreased breath sounds on the right, no wheezing, no crackles. Normal respiratory effort. No accessory muscle use.  Cardiovascular: Regular rate and rhythm, no murmurs / rubs / gallops. No extremity edema. 2+ pedal pulses. Abdomen: no tenderness, no masses palpated. No hepatosplenomegaly. Bowel sounds positive.  Musculoskeletal: no clubbing / cyanosis. No joint deformity upper and lower extremities. Good ROM, no contractures. Normal muscle tone.  Skin: no rashes.  Right-sided chest wall significantly  tender to palpation.  Ecchymoses noted surrounding Pleurx catheter site.  No purulence noted Neurologic: CN 2-12 grossly intact. Sensation intact. Strength 5/5 in all 4.  Psychiatric: Normal judgment and insight. Alert and oriented x 3. Normal mood.   Labs on Admission: I have personally reviewed following labs and imaging studies  CBC: Recent Labs  Lab 04/23/20 2150  WBC 6.6  HGB 12.9  HCT 40.7  MCV 92.7  PLT 712*   Basic Metabolic Panel: Recent Labs  Lab 04/23/20 2150  NA 134*  K 3.9  CL 98  CO2 29  GLUCOSE 101*  BUN 10  CREATININE 0.91  CALCIUM 7.9*   GFR: Estimated Creatinine Clearance: 68.8 mL/min (by C-G formula based on SCr of 0.91 mg/dL). Liver Function Tests: Recent Labs  Lab 04/23/20 2150  AST 59*  ALT 88*  ALKPHOS 334*  BILITOT 0.7  PROT 5.6*  ALBUMIN 2.5*   No results for input(s): LIPASE, AMYLASE  in the last 168 hours. No results for input(s): AMMONIA in the last 168 hours. Coagulation Profile: Recent Labs  Lab 04/23/20 2150  INR 1.2   Cardiac Enzymes: No results for input(s): CKTOTAL, CKMB, CKMBINDEX, TROPONINI in the last 168 hours. BNP (last 3 results) No results for input(s): PROBNP in the last 8760 hours. HbA1C: No results for input(s): HGBA1C in the last 72 hours. CBG: No results for input(s): GLUCAP in the last 168 hours. Lipid Profile: No results for input(s): CHOL, HDL, LDLCALC, TRIG, CHOLHDL, LDLDIRECT in the last 72 hours. Thyroid Function Tests: No results for input(s): TSH, T4TOTAL, FREET4, T3FREE, THYROIDAB in the last 72 hours. Anemia Panel: No results for input(s): VITAMINB12, FOLATE, FERRITIN, TIBC, IRON, RETICCTPCT in the last 72 hours. Urine analysis: No results found for: COLORURINE, APPEARANCEUR, LABSPEC, PHURINE, GLUCOSEU, HGBUR, BILIRUBINUR, KETONESUR, PROTEINUR, UROBILINOGEN, NITRITE, LEUKOCYTESUR Sepsis Labs: @LABRCNTIP (procalcitonin:4,lacticidven:4) )No results found for this or any previous visit (from the  past 240 hour(s)).   Radiological Exams on Admission: CT CHEST W CONTRAST  Result Date: 04/24/2020 CLINICAL DATA:  Right PleurX drain in place. Swelling about the PleurX catheter. Increasing shortness of breath. On chemotherapy. EXAM: CT CHEST, ABDOMEN, AND PELVIS WITH CONTRAST TECHNIQUE: Multidetector CT imaging of the chest, abdomen and pelvis was performed following the standard protocol during bolus administration of intravenous contrast. CONTRAST:  117mL OMNIPAQUE IOHEXOL 300 MG/ML  SOLN COMPARISON:  February 18, 2009. FINDINGS: CT CHEST FINDINGS Cardiovascular: The heart size is normal. There is a trace pericardial effusion. There is no evidence for a thoracic aortic dissection or aneurysm. No evidence for large centrally located pulmonary embolism. Mediastinum/Nodes: --there is a centrally necrotic, peripherally calcified mass in the mediastinum measuring approximately 2.6 cm. Has significantly decreased in size since prior study in 2010. There is an enlarged subcarinal lymph node measuring approximately 1.7 cm. --No axillary lymphadenopathy. --No supraclavicular lymphadenopathy. --Normal thyroid gland. --The esophagus is unremarkable Lungs/Pleura: There is an ill-defined right infrahilar mass measuring approximately 5.5 x 4.5 cm (axial series 2, image 28). There are small areas of consolidation and ground-glass airspace opacities involving the bilateral upper lobes, right lower lobe, and right middle lobe. There are pulmonary nodules in the left lower lobe measuring up to approximately 7 mm (axial series 4, image 79). There is some interlobular septal thickening involving the right upper lobe. There is a small right-sided pleural effusion. There is a right-sided PleurX catheter in place. One of the sideholes of the PleurX catheter is located in the patient's chest wall. There is fluid and gas within the patient's right chest wall without evidence for a definite well-formed drainable collection or abscess  there is no right-sided pneumothorax. Musculoskeletal: There is a small sclerotic lesion in the T7 vertebral body. There is no acute displaced fracture. There is asymmetric right sided skin thickening involving the breast. This is felt to be reactive. Review of the MIP images confirms the above findings. CT ABDOMEN PELVIS FINDINGS Hepatobiliary: There is a small ill-defined hypodensity in hepatic segment 4A/B (axial series 2, image 48). Normal gallbladder.There is no biliary ductal dilation. Pancreas: Normal contours without ductal dilatation. No peripancreatic fluid collection. Spleen: There is a hypoattenuating lesion in the spleen measuring approximately 1.2 cm. This is indeterminate on this exam. Adrenals/Urinary Tract: --Adrenal glands: Unremarkable. --Right kidney/ureter: No hydronephrosis or radiopaque kidney stones. --Left kidney/ureter: No hydronephrosis or radiopaque kidney stones. --Urinary bladder: Unremarkable. Stomach/Bowel: --Stomach/Duodenum: No hiatal hernia or other gastric abnormality. Normal duodenal course and caliber. --Small bowel: Unremarkable. --Colon: Unremarkable. --Appendix:  Normal. Vascular/Lymphatic: Normal course and caliber of the major abdominal vessels. --No retroperitoneal lymphadenopathy. --No mesenteric lymphadenopathy. --No pelvic or inguinal lymphadenopathy. Reproductive: Unremarkable Other: No ascites or free air. Mild body wall edema. Musculoskeletal. There is an area of sclerosis involving the L1 vertebral body, concerning for a metastatic lesion. There are sclerotic lesions in the right iliac bone and right hemi sacrum concerning for metastatic disease. IMPRESSION: 1. Malpositioned right-sided PleurX catheter. One of the sideholes is located within the patient's chest wall. There is free fluid pockets of gas within the patient's right chest wall and right flank. At this time, there is no well-formed drainable fluid collection or abscess. While the fluid in pockets of gas  may be secondary to the malpositioned PleurX catheter, a developing infectious process is within the differential diagnosis. 2. Small right-sided pleural effusion.  No pneumothorax. 3. Findings consistent with metastatic lung cancer including a right infrahilar mass, multiple contralateral pulmonary nodules in the left lung in addition to sclerotic osseous lesions as detailed above. Correlation with the patient's prior outside imaging is recommended. There are ill-defined lesions in the liver and spleen which should be correlated with the patient's prior outside imaging. 4. Ground-glass airspace opacities with areas of consolidation are noted in the right upper lobe and right middle lobe. These may represent post treatment changes or developing pneumonia in the appropriate clinical setting. Electronically Signed   By: Constance Holster M.D.   On: 04/24/2020 01:48   CT ABDOMEN PELVIS W CONTRAST  Result Date: 04/24/2020 CLINICAL DATA:  Right PleurX drain in place. Swelling about the PleurX catheter. Increasing shortness of breath. On chemotherapy. EXAM: CT CHEST, ABDOMEN, AND PELVIS WITH CONTRAST TECHNIQUE: Multidetector CT imaging of the chest, abdomen and pelvis was performed following the standard protocol during bolus administration of intravenous contrast. CONTRAST:  19mL OMNIPAQUE IOHEXOL 300 MG/ML  SOLN COMPARISON:  February 18, 2009. FINDINGS: CT CHEST FINDINGS Cardiovascular: The heart size is normal. There is a trace pericardial effusion. There is no evidence for a thoracic aortic dissection or aneurysm. No evidence for large centrally located pulmonary embolism. Mediastinum/Nodes: --there is a centrally necrotic, peripherally calcified mass in the mediastinum measuring approximately 2.6 cm. Has significantly decreased in size since prior study in 2010. There is an enlarged subcarinal lymph node measuring approximately 1.7 cm. --No axillary lymphadenopathy. --No supraclavicular lymphadenopathy. --Normal  thyroid gland. --The esophagus is unremarkable Lungs/Pleura: There is an ill-defined right infrahilar mass measuring approximately 5.5 x 4.5 cm (axial series 2, image 28). There are small areas of consolidation and ground-glass airspace opacities involving the bilateral upper lobes, right lower lobe, and right middle lobe. There are pulmonary nodules in the left lower lobe measuring up to approximately 7 mm (axial series 4, image 79). There is some interlobular septal thickening involving the right upper lobe. There is a small right-sided pleural effusion. There is a right-sided PleurX catheter in place. One of the sideholes of the PleurX catheter is located in the patient's chest wall. There is fluid and gas within the patient's right chest wall without evidence for a definite well-formed drainable collection or abscess there is no right-sided pneumothorax. Musculoskeletal: There is a small sclerotic lesion in the T7 vertebral body. There is no acute displaced fracture. There is asymmetric right sided skin thickening involving the breast. This is felt to be reactive. Review of the MIP images confirms the above findings. CT ABDOMEN PELVIS FINDINGS Hepatobiliary: There is a small ill-defined hypodensity in hepatic segment 4A/B (axial series 2, image  48). Normal gallbladder.There is no biliary ductal dilation. Pancreas: Normal contours without ductal dilatation. No peripancreatic fluid collection. Spleen: There is a hypoattenuating lesion in the spleen measuring approximately 1.2 cm. This is indeterminate on this exam. Adrenals/Urinary Tract: --Adrenal glands: Unremarkable. --Right kidney/ureter: No hydronephrosis or radiopaque kidney stones. --Left kidney/ureter: No hydronephrosis or radiopaque kidney stones. --Urinary bladder: Unremarkable. Stomach/Bowel: --Stomach/Duodenum: No hiatal hernia or other gastric abnormality. Normal duodenal course and caliber. --Small bowel: Unremarkable. --Colon: Unremarkable.  --Appendix: Normal. Vascular/Lymphatic: Normal course and caliber of the major abdominal vessels. --No retroperitoneal lymphadenopathy. --No mesenteric lymphadenopathy. --No pelvic or inguinal lymphadenopathy. Reproductive: Unremarkable Other: No ascites or free air. Mild body wall edema. Musculoskeletal. There is an area of sclerosis involving the L1 vertebral body, concerning for a metastatic lesion. There are sclerotic lesions in the right iliac bone and right hemi sacrum concerning for metastatic disease. IMPRESSION: 1. Malpositioned right-sided PleurX catheter. One of the sideholes is located within the patient's chest wall. There is free fluid pockets of gas within the patient's right chest wall and right flank. At this time, there is no well-formed drainable fluid collection or abscess. While the fluid in pockets of gas may be secondary to the malpositioned PleurX catheter, a developing infectious process is within the differential diagnosis. 2. Small right-sided pleural effusion.  No pneumothorax. 3. Findings consistent with metastatic lung cancer including a right infrahilar mass, multiple contralateral pulmonary nodules in the left lung in addition to sclerotic osseous lesions as detailed above. Correlation with the patient's prior outside imaging is recommended. There are ill-defined lesions in the liver and spleen which should be correlated with the patient's prior outside imaging. 4. Ground-glass airspace opacities with areas of consolidation are noted in the right upper lobe and right middle lobe. These may represent post treatment changes or developing pneumonia in the appropriate clinical setting. Electronically Signed   By: Constance Holster M.D.   On: 04/24/2020 01:48   DG Chest Port 1 View  Result Date: 04/23/2020 CLINICAL DATA:  Status post right-sided chest drain placed with subsequent right-sided chest swelling. EXAM: PORTABLE CHEST 1 VIEW COMPARISON:  August 24, 2013 FINDINGS: A  right-sided chest tube is seen with its distal tip overlying the medial aspect of the right lung base. There is mild right basilar atelectasis with a small right pleural effusion. Mild right apical pleural fluid is also seen. Mild atelectasis is seen within the mid right lung. Mild right-sided volume loss is seen. No pneumothorax is identified. The heart size and mediastinal contours are within normal limits. The visualized skeletal structures are unremarkable. IMPRESSION: 1. Right-sided chest tube placement. 2. Mild right basilar atelectasis with a small right pleural effusion. 3. Mild atelectasis within the mid right lung. Electronically Signed   By: Virgina Norfolk M.D.   On: 04/23/2020 21:13    EKG: Independently reviewed.  Sinus rhythm  Assessment/Plan Active Problems:   Complication of chest tube   Adenocarcinoma of lung, right (HCC)   Pulmonary embolism (HCC)   Pleuritic chest pain   #Complication of chest tube -Patient with malposition of her Pleurx catheter with significant pleuritic chest pain as well. -Discussed transfer back to [facility] as this was placed there and she has had the majority of her oncologic care there however she would like to stay here as she would like to transfer all of her services to [facility] if possible. -Pulmonology called in the ED who recommended admission and replacement of Pleurx catheter.  Will need to call interventional  radiology in the morning to discuss timing of placement particularly given her need for anticoagulation. -Providing oxycodone as needed for pain. -N.p.o. 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  She was started on Eliquis for her pulmonary embolism.  She last took  this on 5/22 around 8 AM. -Given her need for Pleurx catheter replacement, will hold her Eliquis and start her on heparin drip in the meantime.  We will need to discuss with interventional radiology on the timing of Pleurx catheter placement.  #Pleuritic chest pain -Received IV Dilaudid 1 mg in the ED. -We will continue Percocet as needed for pain -MiraLAX as needed for constipation  #Hyponatremia -Mild -Providing IVF and will recheck in AM - Differential includes SIADH from lung pathology and/or pain versus mild volume depletion   DVT prophylaxis: Heparin drip Code Status: Full Family Communication: None. Spoke to patient at bedside Disposition Plan: Home to self care in next 24-48 hours pending clinical improvement and replacement of pleurX catheter Consults called: Dr. Corinna Lines, pulmonology called by ED.  Admission status: Obs   Arlan Organ DO [facility]  If 7PM-7AM, please contact night-coverage   04/24/2020, 3:31 AM

## 2020-04-24 NOTE — Progress Notes (Signed)
Patient asked me to listen to her lungs, she states it feels like her lungs are rattling. I listen with my stethoscope and her lungs were clear. Her voice sounded a little raspy. This was reported to the night nurse.

## 2020-04-24 NOTE — Procedures (Addendum)
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 Catheter was removed in tact with gentle manual traction.   EBL: None No immediate complications. Post procedure CXR pending.  Dressing to remain x 72H then routine wound care until healed - do not submerge x 7 days, may shower if covered with water tight dressing. Change dressing PRN if soiled. It is normal to have some leakage of pleural fluid/bleeding post removal - if leakage is excessive please call IR.   Please see imaging section of Epic for full dictation.  Joaquim Nam PA-C 04/24/2020 12:27 PM

## 2020-04-24 NOTE — Progress Notes (Signed)
PROGRESS NOTE    Brittany Mccarty  ZOX:[id number] DOB: 02/06/1961 DOA: 04/23/2020 PCP: Patient, No Pcp Per   Chief Complaint  Patient presents with  . Lung Cancer    Pleurex drain swollen and bleeding    Brief Narrative:   Brittany Mccarty is Brittany Mccarty 59 y.o. 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  She reports having her Pleurx catheter placed on 5/17 with initially no complications however the following day she was started on anticoagulation with Eliquis for her pulmonary embolism.  She then noticed each time she drained her Pleurx catheter the fluid was bright red.  [place] Swing couple of days later she started noticing increasing pain around her chest tube site along with significant bruising as well.  She states that yesterday she noticed her entire right side had turned purple.  She did want to go back to the emergency room and so she did not tell anybody about this until 5/22 when her daughter went to drain her Pleurx catheter.  When her daughter noticed all of the ecchymoses as well as the significant tenderness to minimal palpation they called [facility] who recommended the patient come to the closest emergency room.  She currently denies any difficulty breathing, nausea, vomiting, fever, chills, cough, dysuria.  She states that since starting her chemotherapy she actually feels much improved with less pain overall.  ED Course: In the ED 1 mg of IV Dilaudid for pain followed by CT of the chest abdomen pelvis.  CT revealed that her Pleurx catheter has one of the sidewalls located in the patient's chest wall leading to free fluid pockets of gas in this area.  No drainable fluid collection or abscess noted however cannot fully rule out infectious process although this is most likely related to the malpositioned  Pleurx catheter.  Given that the Pleurx catheter was placed at [facility] there was discussion regarding patient needing to be transferred there however she would like to transfer all her care to [facility] as her insurance does not cover [facility].  Reports not wanting to be sent back to [facility].  ED called pulmonology, Dr. Oletta Darter, who recommended admission and replacement of the Pleurx catheter.  Patient will be admitted to the hospital under the care of the hospitalist service with observation status for chest tube dysfunction and right-sided chest pain.  Assessment & Plan:   Active Problems:   Complication of chest tube   Adenocarcinoma of lung, right (HCC)   Pulmonary embolism (HCC)   Pleuritic chest pain  # Complication of chest tube - Patient with malposition of her Pleurx catheter with significant pleuritic chest pain as well. - free fluid and pockets of gas within patient's R chest wall and R flank - no well formed drainable fluid collection or abscess -> ? 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  She was started on Eliquis for her pulmonary embolism.  She last took this on 5/22 around 8 AM. - continue heparin gtt for now  #Hyponatremia -resolved  # Elevated Liver Enzymes:  continue to monitor, likely related to malignancy  DVT prophylaxis: heparin gtt Code Status: full  Family Communication: daugther at bedside Disposition:   Status is: Observation  The patient will require care spanning > 2 midnights and should be moved to inpatient because: IV treatments appropriate due to intensity of illness or inability to take PO and Inpatient level of care appropriate due to severity of illness.  Change to inpatient, ED care started yesterday before midnight.  Pt will need additional inpatient care with need for IV abx and IV pain meds.  Dispo: The patient is from: Home              Anticipated d/c is to: Home              Anticipated d/c date is: 3 days              Patient currently is not medically stable to d/c.   Consultants:   IR  Procedures: Removal of tunneled R pleural catheter 5/23  Antimicrobials:  Anti-infectives (From admission, onward)   Start     Dose/Rate Route Frequency Ordered Stop   04/25/20 1200  vancomycin (VANCOCIN) IVPB 1000 mg/200 mL premix     1,000 mg 200 mL/hr over 60 Minutes Intravenous Every 24 hours 04/24/20 1222     04/24/20 1400  Ampicillin-Sulbactam (UNASYN) 3 g in sodium chloride 0.9 % 100 mL IVPB     3 g 200 mL/hr over 30 Minutes Intravenous Every 6 hours 04/24/20 1220     04/24/20 1300  vancomycin (VANCOCIN) IVPB 1000 mg/200 mL premix  Status:  Discontinued     1,000 mg 200 mL/hr over 60 Minutes Intravenous Every 24 hours 04/24/20 1220 04/24/20 1222   04/24/20 1230  vancomycin (VANCOREADY) IVPB 1500 mg/300 mL     1,500 mg 150 mL/hr over 120 Minutes Intravenous  Once 04/24/20 1222       Subjective: C/o pain to R chest/flank  Objective: Vitals:   04/24/20 0515 04/24/20 0516 04/24/20 0915 04/24/20 1303  BP:  140/84 133/85 (!) 144/90  Pulse:  92 78 82  Resp:  18 18 15   Temp:  98.1 F (36.7 C) 97.8 F (36.6 C) 98.2 F (36.8 C)  TempSrc:  Oral Oral Oral  SpO2:  100% (!) 84% 97%  Weight: 83.7 kg     Height: 5'  4" (1.626 m)       Intake/Output Summary (Last 24 hours) at 04/24/2020 1411 Last data filed at 04/24/2020 2423 Gross per 24 hour  Intake 166.25 ml  Output --  Net 166.25 ml   Filed Weights   04/23/20 2018 04/24/20 0515  Weight: 83.7 kg 83.7 kg    Examination:  General exam: Appears calm and comfortable  Respiratory system: Clear to auscultation. Respiratory effort normal. Cardiovascular system: S1 & S2 heard, RRR. Bruising/induration to R chest/breast/flank - tender to palpation  Gastrointestinal system: Abdomen is nondistended, soft and nontender. Central nervous system: Alert and oriented. No focal neurological deficits. Extremities: no LEE Skin: No rashes, lesions or ulcers Psychiatry: Judgement and insight appear normal. Mood & affect appropriate.     Data Reviewed:  I have personally reviewed following labs and imaging studies  CBC: Recent Labs  Lab 04/23/20 2150 04/24/20 0538  WBC 6.6 6.0  HGB 12.9 12.6  HCT 40.7 38.5  MCV 92.7 88.9  PLT 431* 902    Basic Metabolic Panel: Recent Labs  Lab 04/23/20 2150 04/24/20 0538  NA 134* 137  K 3.9 3.9  CL 98 102  CO2 29 25  GLUCOSE 101* 102*  BUN 10 9  CREATININE 0.91 0.89  CALCIUM 7.9* 8.2*    GFR: Estimated Creatinine Clearance: 70.4 mL/min (by C-G formula based on SCr of 0.89 mg/dL).  Liver Function Tests: Recent Labs  Lab 04/23/20 2150  AST 59*  ALT 88*  ALKPHOS 334*  BILITOT 0.7  PROT 5.6*  ALBUMIN 2.5*    CBG: No results for input(s): GLUCAP in the last 168 hours.   Recent Results (from the past 240 hour(s))  SARS Coronavirus 2 by RT PCR (hospital order, performed in [facility] hospital lab) Nasopharyngeal Nasopharyngeal Swab     Status: None   Collection Time: 04/24/20  2:35 AM   Specimen: Nasopharyngeal Swab  Result Value Ref Range Status   SARS Coronavirus 2 NEGATIVE NEGATIVE Final    Comment: (NOTE) SARS-CoV-2 target nucleic acids are NOT DETECTED. The SARS-CoV-2 RNA is generally  detectable in upper and lower respiratory specimens during the acute phase of infection. The lowest concentration of SARS-CoV-2 viral copies this assay can detect is 250 copies / mL. Lennyn Bellanca negative result does not preclude SARS-CoV-2 infection and should not be used as the sole basis for treatment or other patient management decisions.  Verenice Westrich negative result may occur with improper specimen collection / handling, submission of specimen other than nasopharyngeal swab, presence of viral mutation(s) within the areas targeted by this assay, and inadequate number of viral copies (<250 copies / mL). Icelyn Navarrete negative result must be combined with clinical observations, patient history, and epidemiological information. Fact Sheet for Patients:   StrictlyIdeas.no Fact Sheet for Healthcare Providers: BankingDealers.co.za This test is not yet approved or cleared  by the [place] FDA and has been authorized for detection and/or diagnosis of SARS-CoV-2 by FDA under an Emergency Use Authorization (EUA).  This EUA will remain in effect (meaning this test can be used) for the duration of the COVID-19 declaration under Section 564(b)(1) of the Act, 21 U.S.C. section 360bbb-3(b)(1), unless the authorization is terminated or revoked sooner. Performed at [facility], [place] [address], [place], [place] [address]          Radiology Studies: CT CHEST W CONTRAST  Result Date: 04/24/2020 CLINICAL DATA:  Right PleurX drain in place. Swelling about the PleurX catheter. Increasing shortness of breath. On chemotherapy. EXAM: CT CHEST, ABDOMEN, AND PELVIS WITH CONTRAST TECHNIQUE: Multidetector CT imaging of the chest, abdomen and pelvis was performed following the standard protocol during bolus administration of intravenous contrast. CONTRAST:  173mL OMNIPAQUE IOHEXOL 300 MG/ML  SOLN COMPARISON:  February 18, 2009. FINDINGS: CT CHEST FINDINGS Cardiovascular: The  heart size is normal. There is Makaya Juneau trace pericardial effusion. There is no evidence for Alanta Scobey thoracic aortic dissection or aneurysm. No evidence for large centrally located pulmonary embolism. Mediastinum/Nodes: --there is Zi Sek centrally necrotic, peripherally calcified mass in the mediastinum measuring approximately 2.6 cm. Has significantly decreased in size since prior study in 2010. There is an enlarged subcarinal lymph node measuring approximately 1.7 cm. --No axillary lymphadenopathy. --No supraclavicular lymphadenopathy. --Normal thyroid gland. --The esophagus is unremarkable Lungs/Pleura: There is an ill-defined right  infrahilar mass measuring approximately 5.5 x 4.5 cm (axial series 2, image 28). There are small areas of consolidation and ground-glass airspace opacities involving the bilateral upper lobes, right lower lobe, and right middle lobe. There are pulmonary nodules in the left lower lobe measuring up to approximately 7 mm (axial series 4, image 79). There is some interlobular septal thickening involving the right upper lobe. There is Catrina Fellenz small right-sided pleural effusion. There is Rosiland Sen right-sided PleurX catheter in place. One of the sideholes of the PleurX catheter is located in the patient's chest wall. There is fluid and gas within the patient's right chest wall without evidence for Lyn Deemer definite well-formed drainable collection or abscess there is no right-sided pneumothorax. Musculoskeletal: There is Kourtney Terriquez small sclerotic lesion in the T7 vertebral body. There is no acute displaced fracture. There is asymmetric right sided skin thickening involving the breast. This is felt to be reactive. Review of the MIP images confirms the above findings. CT ABDOMEN PELVIS FINDINGS Hepatobiliary: There is Lejon Afzal small ill-defined hypodensity in hepatic segment 4A/B (axial series 2, image 48). Normal gallbladder.There is no biliary ductal dilation. Pancreas: Normal contours without ductal dilatation. No peripancreatic fluid  collection. Spleen: There is Cambre Matson hypoattenuating lesion in the spleen measuring approximately 1.2 cm. This is indeterminate on this exam. Adrenals/Urinary Tract: --Adrenal glands: Unremarkable. --Right kidney/ureter: No hydronephrosis or radiopaque kidney stones. --Left kidney/ureter: No hydronephrosis or radiopaque kidney stones. --Urinary bladder: Unremarkable. Stomach/Bowel: --Stomach/Duodenum: No hiatal hernia or other gastric abnormality. Normal duodenal course and caliber. --Small bowel: Unremarkable. --Colon: Unremarkable. --Appendix: Normal. Vascular/Lymphatic: Normal course and caliber of the major abdominal vessels. --No retroperitoneal lymphadenopathy. --No mesenteric lymphadenopathy. --No pelvic or inguinal lymphadenopathy. Reproductive: Unremarkable Other: No ascites or free air. Mild body wall edema. Musculoskeletal. There is an area of sclerosis involving the L1 vertebral body, concerning for Anastyn Ayars metastatic lesion. There are sclerotic lesions in the right iliac bone and right hemi sacrum concerning for metastatic disease. IMPRESSION: 1. Malpositioned right-sided PleurX catheter. One of the sideholes is located within the patient's chest wall. There is free fluid pockets of gas within the patient's right chest wall and right flank. At this time, there is no well-formed drainable fluid collection or abscess. While the fluid in pockets of gas may be secondary to the malpositioned PleurX catheter, Brayley Mackowiak developing infectious process is within the differential diagnosis. 2. Small right-sided pleural effusion.  No pneumothorax. 3. Findings consistent with metastatic lung cancer including Tiamarie Furnari right infrahilar mass, multiple contralateral pulmonary nodules in the left lung in addition to sclerotic osseous lesions as detailed above. Correlation with the patient's prior outside imaging is recommended. There are ill-defined lesions in the liver and spleen which should be correlated with the patient's prior outside  imaging. 4. Ground-glass airspace opacities with areas of consolidation are noted in the right upper lobe and right middle lobe. These may represent post treatment changes or developing pneumonia in the appropriate clinical setting. Electronically Signed   By: Constance Holster M.D.   On: 04/24/2020 01:48   CT ABDOMEN PELVIS W CONTRAST  Result Date: 04/24/2020 CLINICAL DATA:  Right PleurX drain in place. Swelling about the PleurX catheter. Increasing shortness of breath. On chemotherapy. EXAM: CT CHEST, ABDOMEN, AND PELVIS WITH CONTRAST TECHNIQUE: Multidetector CT imaging of the chest, abdomen and pelvis was performed following the standard protocol during bolus administration of intravenous contrast. CONTRAST:  151mL OMNIPAQUE IOHEXOL 300 MG/ML  SOLN COMPARISON:  February 18, 2009. FINDINGS: CT CHEST FINDINGS Cardiovascular: The heart size is normal.  There is Dhara Schepp trace pericardial effusion. There is no evidence for Talise Sligh thoracic aortic dissection or aneurysm. No evidence for large centrally located pulmonary embolism. Mediastinum/Nodes: --there is Jenaya Saar centrally necrotic, peripherally calcified mass in the mediastinum measuring approximately 2.6 cm. Has significantly decreased in size since prior study in 2010. There is an enlarged subcarinal lymph node measuring approximately 1.7 cm. --No axillary lymphadenopathy. --No supraclavicular lymphadenopathy. --Normal thyroid gland. --The esophagus is unremarkable Lungs/Pleura: There is an ill-defined right infrahilar mass measuring approximately 5.5 x 4.5 cm (axial series 2, image 28). There are small areas of consolidation and ground-glass airspace opacities involving the bilateral upper lobes, right lower lobe, and right middle lobe. There are pulmonary nodules in the left lower lobe measuring up to approximately 7 mm (axial series 4, image 79). There is some interlobular septal thickening involving the right upper lobe. There is Gaylan Fauver small right-sided pleural effusion. There  is Calah Gershman right-sided PleurX catheter in place. One of the sideholes of the PleurX catheter is located in the patient's chest wall. There is fluid and gas within the patient's right chest wall without evidence for Tanaiya Kolarik definite well-formed drainable collection or abscess there is no right-sided pneumothorax. Musculoskeletal: There is Joshva Labreck small sclerotic lesion in the T7 vertebral body. There is no acute displaced fracture. There is asymmetric right sided skin thickening involving the breast. This is felt to be reactive. Review of the MIP images confirms the above findings. CT ABDOMEN PELVIS FINDINGS Hepatobiliary: There is Tore Carreker small ill-defined hypodensity in hepatic segment 4A/B (axial series 2, image 48). Normal gallbladder.There is no biliary ductal dilation. Pancreas: Normal contours without ductal dilatation. No peripancreatic fluid collection. Spleen: There is Jari Dipasquale hypoattenuating lesion in the spleen measuring approximately 1.2 cm. This is indeterminate on this exam. Adrenals/Urinary Tract: --Adrenal glands: Unremarkable. --Right kidney/ureter: No hydronephrosis or radiopaque kidney stones. --Left kidney/ureter: No hydronephrosis or radiopaque kidney stones. --Urinary bladder: Unremarkable. Stomach/Bowel: --Stomach/Duodenum: No hiatal hernia or other gastric abnormality. Normal duodenal course and caliber. --Small bowel: Unremarkable. --Colon: Unremarkable. --Appendix: Normal. Vascular/Lymphatic: Normal course and caliber of the major abdominal vessels. --No retroperitoneal lymphadenopathy. --No mesenteric lymphadenopathy. --No pelvic or inguinal lymphadenopathy. Reproductive: Unremarkable Other: No ascites or free air. Mild body wall edema. Musculoskeletal. There is an area of sclerosis involving the L1 vertebral body, concerning for Demari Kropp metastatic lesion. There are sclerotic lesions in the right iliac bone and right hemi sacrum concerning for metastatic disease. IMPRESSION: 1. Malpositioned right-sided PleurX catheter. One  of the sideholes is located within the patient's chest wall. There is free fluid pockets of gas within the patient's right chest wall and right flank. At this time, there is no well-formed drainable fluid collection or abscess. While the fluid in pockets of gas may be secondary to the malpositioned PleurX catheter, Kaylanni Ezelle developing infectious process is within the differential diagnosis. 2. Small right-sided pleural effusion.  No pneumothorax. 3. Findings consistent with metastatic lung cancer including Deshara Rossi right infrahilar mass, multiple contralateral pulmonary nodules in the left lung in addition to sclerotic osseous lesions as detailed above. Correlation with the patient's prior outside imaging is recommended. There are ill-defined lesions in the liver and spleen which should be correlated with the patient's prior outside imaging. 4. Ground-glass airspace opacities with areas of consolidation are noted in the right upper lobe and right middle lobe. These may represent post treatment changes or developing pneumonia in the appropriate clinical setting. Electronically Signed   By: Constance Holster M.D.   On: 04/24/2020 01:48   DG  Chest Port 1 View  Result Date: 04/24/2020 CLINICAL DATA:  Right pleural effusion, removed partially dislodged drain catheter EXAM: PORTABLE CHEST 1 VIEW COMPARISON:  04/23/2020 FINDINGS: The heart size and mediastinal contours are within normal limits. Interval removal of Lindora Alviar previously seen right-sided tunneled pleural catheter. Unchanged small right pleural effusion associated atelectasis or consolidation. Left lung is normally aerated. The visualized skeletal structures are unremarkable. IMPRESSION: Interval removal of Bri Wakeman previously seen right-sided tunneled pleural catheter. Unchanged small right pleural effusion and associated atelectasis or consolidation. Electronically Signed   By: Eddie Candle M.D.   On: 04/24/2020 13:54   DG Chest Port 1 View  Result Date: 04/23/2020 CLINICAL  DATA:  Status post right-sided chest drain placed with subsequent right-sided chest swelling. EXAM: PORTABLE CHEST 1 VIEW COMPARISON:  August 24, 2013 FINDINGS: Taegen Lennox right-sided chest tube is seen with its distal tip overlying the medial aspect of the right lung base. There is mild right basilar atelectasis with Angas Isabell small right pleural effusion. Mild right apical pleural fluid is also seen. Mild atelectasis is seen within the mid right lung. Mild right-sided volume loss is seen. No pneumothorax is identified. The heart size and mediastinal contours are within normal limits. The visualized skeletal structures are unremarkable. IMPRESSION: 1. Right-sided chest tube placement. 2. Mild right basilar atelectasis with Sylver Vantassell small right pleural effusion. 3. Mild atelectasis within the mid right lung. Electronically Signed   By: Virgina Norfolk M.D.   On: 04/23/2020 21:13        Scheduled Meds: . famotidine  40 mg Oral BID  . folic acid  1 mg Oral Daily  . osimertinib mesylate  80 mg Oral Daily   Continuous Infusions: . ampicillin-sulbactam (UNASYN) IV    . heparin    . lactated ringers 75 mL/hr at 04/24/20 0558  . [START ON 04/25/2020] vancomycin    . vancomycin 1,500 mg (04/24/20 1334)     LOS: 0 days    Time spent: over 30 min    Fayrene Helper, MD [facility]   To contact the attending provider between 7A-7P or the covering provider during after hours 7P-7A, please log into the web site [email or web address] and access using universal [place] password for that web site. If you do not have the password, please call the hospital operator.  04/24/2020, 2:11 PM

## 2020-04-24 NOTE — ED Notes (Signed)
ED TO INPATIENT HANDOFF REPORT  ED Nurse Name and Phone #: Clarise Cruz [phone]  S Name/Age/Gender Brittany Mccarty 59 y.o. female Room/Bed: WA11/WA11  Code Status   Code Status: Full Code  Home/SNF/Other Home Patient oriented to: self, place, time and situation Is this baseline? Yes   Triage Complete: Triage complete  Chief Complaint Complication of chest tube [T85.9XXA]  Triage Note Pt has a R side pleurex drain in place d/t a collapsed R lung. She states that the area has been swelling and bleeding. Cancer center recommended that she come in immediately. Reports increased SOB. Dx'd on 5/3. Takes oral chemo daily.     Allergies Allergies  Allergen Reactions  . Other     Blood Products  . Cefdinir Rash    Level of Care/Admitting Diagnosis ED Disposition    ED Disposition Condition [facility] Area: [place] [100102]  Level of Care: Med-Surg [16]  Covid Evaluation: Asymptomatic Screening Protocol (No Symptoms)  Diagnosis: Complication of chest tube [387564]  Admitting Physician: Arlan Organ [[id number]]  Attending Physician: Arlan Organ [[id number]]       B Medical/Surgery History History reviewed. No pertinent past medical history. History reviewed. No pertinent surgical history.   A IV Location/Drains/Wounds Patient Lines/Drains/Airways Status   Active Line/Drains/Airways    Name:   Placement date:   Placement time:   Site:   Days:   Peripheral IV 04/23/20 Left Antecubital   04/23/20    2142    Antecubital   1          Intake/Output Last 24 hours No intake or output data in the 24 hours ending 04/24/20 0422  Labs/Imaging Results for orders placed or performed during the hospital encounter of 04/23/20 (from the past 48 hour(s))  CBC     Status: Abnormal   Collection Time: 04/23/20  9:50 PM  Result Value Ref Range   WBC 6.6 4.0 - 10.5 K/uL   RBC 4.39 3.87 - 5.11 MIL/uL   Hemoglobin 12.9 12.0 - 15.0 g/dL   HCT  40.7 36.0 - 46.0 %   MCV 92.7 80.0 - 100.0 fL   MCH 29.4 26.0 - 34.0 pg   MCHC 31.7 30.0 - 36.0 g/dL   RDW 13.1 11.5 - 15.5 %   Platelets 431 (H) 150 - 400 K/uL   nRBC 0.0 0.0 - 0.2 %    Comment: Performed at [facility], [place] [address] [place], [place] [address]  Comprehensive metabolic panel     Status: Abnormal   Collection Time: 04/23/20  9:50 PM  Result Value Ref Range   Sodium 134 (L) 135 - 145 mmol/L   Potassium 3.9 3.5 - 5.1 mmol/L   Chloride 98 98 - 111 mmol/L   CO2 29 22 - 32 mmol/L   Glucose, Bld 101 (H) 70 - 99 mg/dL    Comment: Glucose reference range applies only to samples taken after fasting for at least 8 hours.   BUN 10 6 - 20 mg/dL   Creatinine, Ser 0.91 0.44 - 1.00 mg/dL   Calcium 7.9 (L) 8.9 - 10.3 mg/dL   Total Protein 5.6 (L) 6.5 - 8.1 g/dL   Albumin 2.5 (L) 3.5 - 5.0 g/dL   AST 59 (H) 15 - 41 U/L   ALT 88 (H) 0 - 44 U/L   Alkaline Phosphatase 334 (H) 38 - 126 U/L   Total Bilirubin 0.7 0.3 - 1.2 mg/dL   GFR calc non  Af Amer >60 >60 mL/min   GFR calc Af Amer >60 >60 mL/min   Anion gap 7 5 - 15    Comment: Performed at [facility], [place] [address] [place], [place] [address]  Lactic acid, plasma     Status: None   Collection Time: 04/23/20  9:50 PM  Result Value Ref Range   Lactic Acid, Venous 1.3 0.5 - 1.9 mmol/L    Comment: Performed at [facility], [place] [address] [place], [place] [address]  Troponin I (High Sensitivity)     Status: None   Collection Time: 04/23/20  9:50 PM  Result Value Ref Range   Troponin I (High Sensitivity) 8 <18 ng/L    Comment: (NOTE) Elevated high sensitivity troponin I (hsTnI) values and significant  changes across serial measurements may suggest ACS but many other  chronic and acute conditions are known to elevate hsTnI results.  Refer to the "Links" section for chest pain algorithms and additional  guidance. Performed at [facility], [place]  [address] [place], [place] [address]   Protime-INR     Status: None   Collection Time: 04/23/20  9:50 PM  Result Value Ref Range   Prothrombin Time 14.3 11.4 - 15.2 seconds   INR 1.2 0.8 - 1.2    Comment: (NOTE) INR goal varies based on device and disease states. Performed at [facility], [place] [address], [place], [place] [address]   APTT     Status: None   Collection Time: 04/23/20  9:50 PM  Result Value Ref Range   aPTT 34 24 - 36 seconds    Comment: Performed at [facility], [place] [address] [place], [place] [address]   CT CHEST W CONTRAST  Result Date: 04/24/2020 CLINICAL DATA:  Right PleurX drain in place. Swelling about the PleurX catheter. Increasing shortness of breath. On chemotherapy. EXAM: CT CHEST, ABDOMEN, AND PELVIS WITH CONTRAST TECHNIQUE: Multidetector CT imaging of the chest, abdomen and pelvis was performed following the standard protocol during bolus administration of intravenous contrast. CONTRAST:  134mL OMNIPAQUE IOHEXOL 300 MG/ML  SOLN COMPARISON:  February 18, 2009. FINDINGS: CT CHEST FINDINGS Cardiovascular: The heart size is normal. There is a trace pericardial effusion. There is no evidence for a thoracic aortic dissection or aneurysm. No evidence for large centrally located pulmonary embolism. Mediastinum/Nodes: --there is a centrally necrotic, peripherally calcified mass in the mediastinum measuring approximately 2.6 cm. Has significantly decreased in size since prior study in 2010. There is an enlarged subcarinal lymph node measuring approximately 1.7 cm. --No axillary lymphadenopathy. --No supraclavicular lymphadenopathy. --Normal thyroid gland. --The esophagus is unremarkable Lungs/Pleura: There is an ill-defined right infrahilar mass measuring approximately 5.5 x 4.5 cm (axial series 2, image 28). There are small areas of consolidation and ground-glass airspace opacities involving the bilateral upper lobes, right lower lobe, and  right middle lobe. There are pulmonary nodules in the left lower lobe measuring up to approximately 7 mm (axial series 4, image 79). There is some interlobular septal thickening involving the right upper lobe. There is a small right-sided pleural effusion. There is a right-sided PleurX catheter in place. One of the sideholes of the PleurX catheter is located in the patient's chest wall. There is fluid and gas within the patient's right chest wall without evidence for a definite well-formed drainable collection or abscess there is no right-sided pneumothorax. Musculoskeletal: There is a small sclerotic lesion in the T7 vertebral body. There is no  acute displaced fracture. There is asymmetric right sided skin thickening involving the breast. This is felt to be reactive. Review of the MIP images confirms the above findings. CT ABDOMEN PELVIS FINDINGS Hepatobiliary: There is a small ill-defined hypodensity in hepatic segment 4A/B (axial series 2, image 48). Normal gallbladder.There is no biliary ductal dilation. Pancreas: Normal contours without ductal dilatation. No peripancreatic fluid collection. Spleen: There is a hypoattenuating lesion in the spleen measuring approximately 1.2 cm. This is indeterminate on this exam. Adrenals/Urinary Tract: --Adrenal glands: Unremarkable. --Right kidney/ureter: No hydronephrosis or radiopaque kidney stones. --Left kidney/ureter: No hydronephrosis or radiopaque kidney stones. --Urinary bladder: Unremarkable. Stomach/Bowel: --Stomach/Duodenum: No hiatal hernia or other gastric abnormality. Normal duodenal course and caliber. --Small bowel: Unremarkable. --Colon: Unremarkable. --Appendix: Normal. Vascular/Lymphatic: Normal course and caliber of the major abdominal vessels. --No retroperitoneal lymphadenopathy. --No mesenteric lymphadenopathy. --No pelvic or inguinal lymphadenopathy. Reproductive: Unremarkable Other: No ascites or free air. Mild body wall edema. Musculoskeletal. There  is an area of sclerosis involving the L1 vertebral body, concerning for a metastatic lesion. There are sclerotic lesions in the right iliac bone and right hemi sacrum concerning for metastatic disease. IMPRESSION: 1. Malpositioned right-sided PleurX catheter. One of the sideholes is located within the patient's chest wall. There is free fluid pockets of gas within the patient's right chest wall and right flank. At this time, there is no well-formed drainable fluid collection or abscess. While the fluid in pockets of gas may be secondary to the malpositioned PleurX catheter, a developing infectious process is within the differential diagnosis. 2. Small right-sided pleural effusion.  No pneumothorax. 3. Findings consistent with metastatic lung cancer including a right infrahilar mass, multiple contralateral pulmonary nodules in the left lung in addition to sclerotic osseous lesions as detailed above. Correlation with the patient's prior outside imaging is recommended. There are ill-defined lesions in the liver and spleen which should be correlated with the patient's prior outside imaging. 4. Ground-glass airspace opacities with areas of consolidation are noted in the right upper lobe and right middle lobe. These may represent post treatment changes or developing pneumonia in the appropriate clinical setting. Electronically Signed   By: Constance Holster M.D.   On: 04/24/2020 01:48   CT ABDOMEN PELVIS W CONTRAST  Result Date: 04/24/2020 CLINICAL DATA:  Right PleurX drain in place. Swelling about the PleurX catheter. Increasing shortness of breath. On chemotherapy. EXAM: CT CHEST, ABDOMEN, AND PELVIS WITH CONTRAST TECHNIQUE: Multidetector CT imaging of the chest, abdomen and pelvis was performed following the standard protocol during bolus administration of intravenous contrast. CONTRAST:  13mL OMNIPAQUE IOHEXOL 300 MG/ML  SOLN COMPARISON:  February 18, 2009. FINDINGS: CT CHEST FINDINGS Cardiovascular: The heart size  is normal. There is a trace pericardial effusion. There is no evidence for a thoracic aortic dissection or aneurysm. No evidence for large centrally located pulmonary embolism. Mediastinum/Nodes: --there is a centrally necrotic, peripherally calcified mass in the mediastinum measuring approximately 2.6 cm. Has significantly decreased in size since prior study in 2010. There is an enlarged subcarinal lymph node measuring approximately 1.7 cm. --No axillary lymphadenopathy. --No supraclavicular lymphadenopathy. --Normal thyroid gland. --The esophagus is unremarkable Lungs/Pleura: There is an ill-defined right infrahilar mass measuring approximately 5.5 x 4.5 cm (axial series 2, image 28). There are small areas of consolidation and ground-glass airspace opacities involving the bilateral upper lobes, right lower lobe, and right middle lobe. There are pulmonary nodules in the left lower lobe measuring up to approximately 7 mm (axial series 4, image 79). There  is some interlobular septal thickening involving the right upper lobe. There is a small right-sided pleural effusion. There is a right-sided PleurX catheter in place. One of the sideholes of the PleurX catheter is located in the patient's chest wall. There is fluid and gas within the patient's right chest wall without evidence for a definite well-formed drainable collection or abscess there is no right-sided pneumothorax. Musculoskeletal: There is a small sclerotic lesion in the T7 vertebral body. There is no acute displaced fracture. There is asymmetric right sided skin thickening involving the breast. This is felt to be reactive. Review of the MIP images confirms the above findings. CT ABDOMEN PELVIS FINDINGS Hepatobiliary: There is a small ill-defined hypodensity in hepatic segment 4A/B (axial series 2, image 48). Normal gallbladder.There is no biliary ductal dilation. Pancreas: Normal contours without ductal dilatation. No peripancreatic fluid collection.  Spleen: There is a hypoattenuating lesion in the spleen measuring approximately 1.2 cm. This is indeterminate on this exam. Adrenals/Urinary Tract: --Adrenal glands: Unremarkable. --Right kidney/ureter: No hydronephrosis or radiopaque kidney stones. --Left kidney/ureter: No hydronephrosis or radiopaque kidney stones. --Urinary bladder: Unremarkable. Stomach/Bowel: --Stomach/Duodenum: No hiatal hernia or other gastric abnormality. Normal duodenal course and caliber. --Small bowel: Unremarkable. --Colon: Unremarkable. --Appendix: Normal. Vascular/Lymphatic: Normal course and caliber of the major abdominal vessels. --No retroperitoneal lymphadenopathy. --No mesenteric lymphadenopathy. --No pelvic or inguinal lymphadenopathy. Reproductive: Unremarkable Other: No ascites or free air. Mild body wall edema. Musculoskeletal. There is an area of sclerosis involving the L1 vertebral body, concerning for a metastatic lesion. There are sclerotic lesions in the right iliac bone and right hemi sacrum concerning for metastatic disease. IMPRESSION: 1. Malpositioned right-sided PleurX catheter. One of the sideholes is located within the patient's chest wall. There is free fluid pockets of gas within the patient's right chest wall and right flank. At this time, there is no well-formed drainable fluid collection or abscess. While the fluid in pockets of gas may be secondary to the malpositioned PleurX catheter, a developing infectious process is within the differential diagnosis. 2. Small right-sided pleural effusion.  No pneumothorax. 3. Findings consistent with metastatic lung cancer including a right infrahilar mass, multiple contralateral pulmonary nodules in the left lung in addition to sclerotic osseous lesions as detailed above. Correlation with the patient's prior outside imaging is recommended. There are ill-defined lesions in the liver and spleen which should be correlated with the patient's prior outside imaging. 4.  Ground-glass airspace opacities with areas of consolidation are noted in the right upper lobe and right middle lobe. These may represent post treatment changes or developing pneumonia in the appropriate clinical setting. Electronically Signed   By: Constance Holster M.D.   On: 04/24/2020 01:48   DG Chest Port 1 View  Result Date: 04/23/2020 CLINICAL DATA:  Status post right-sided chest drain placed with subsequent right-sided chest swelling. EXAM: PORTABLE CHEST 1 VIEW COMPARISON:  August 24, 2013 FINDINGS: A right-sided chest tube is seen with its distal tip overlying the medial aspect of the right lung base. There is mild right basilar atelectasis with a small right pleural effusion. Mild right apical pleural fluid is also seen. Mild atelectasis is seen within the mid right lung. Mild right-sided volume loss is seen. No pneumothorax is identified. The heart size and mediastinal contours are within normal limits. The visualized skeletal structures are unremarkable. IMPRESSION: 1. Right-sided chest tube placement. 2. Mild right basilar atelectasis with a small right pleural effusion. 3. Mild atelectasis within the mid right lung. Electronically Signed   By:  Virgina Norfolk M.D.   On: 04/23/2020 21:13    Pending Labs Unresulted Labs (From admission, onward)    Start     Ordered   04/24/20 1300  Heparin level (unfractionated)  Once-Timed,   STAT     04/24/20 0345   04/24/20 1300  APTT  Once,   STAT     04/24/20 0351   04/24/20 5035  Basic metabolic panel  Tomorrow morning,   R     04/24/20 0311   04/24/20 0500  CBC  Daily,   R     04/24/20 0345   04/24/20 0306  HIV Antibody (routine testing w rflx)  (HIV Antibody (Routine testing w reflex) panel)  Once,   STAT     04/24/20 0311   04/24/20 0235  SARS Coronavirus 2 by RT PCR (hospital order, performed in [place] hospital lab) Nasopharyngeal Nasopharyngeal Swab  (Tier 2 (TAT 2 hrs))  Once,   STAT    Question Answer Comment  Is this test  for diagnosis or screening Screening   Symptomatic for COVID-19 as defined by CDC No   Hospitalized for COVID-19 No   Admitted to ICU for COVID-19 No   Previously tested for COVID-19 No   Resident in a congregate (group) care setting Unknown   Employed in healthcare setting Unknown   Pregnant No   Has patient completed COVID vaccination(s) (2 doses of Pfizer/Moderna 1 dose of [employer]) Unknown      04/24/20 0234          Vitals/Pain Today's Vitals   04/24/20 0012 04/24/20 0100 04/24/20 0200 04/24/20 0400  BP:  119/80 117/85 127/80  Pulse:  94 89 90  Resp:  (!) 21 11   Temp:      TempSrc:      SpO2:  99% 97% 97%  Weight:      Height:      PainSc: 2        Isolation Precautions No active isolations  Medications Medications  sodium chloride (PF) 0.9 % injection (has no administration in time range)  oxyCODONE-acetaminophen (PERCOCET) 7.5-325 MG per tablet 1 tablet (1 tablet Oral Given 04/24/20 0418)  osimertinib mesylate (TAGRISSO) tablet 80 mg (has no administration in time range)  famotidine (PEPCID) tablet 40 mg (has no administration in time range)  ondansetron (ZOFRAN) tablet 4 mg (has no administration in time range)  folic acid (FOLVITE) tablet 1 mg (has no administration in time range)  benzonatate (TESSALON) capsule 100 mg (has no administration in time range)  acetaminophen (TYLENOL) tablet 650 mg (has no administration in time range)    Or  acetaminophen (TYLENOL) suppository 650 mg (has no administration in time range)  polyethylene glycol (MIRALAX / GLYCOLAX) packet 17 g (has no administration in time range)  albuterol (PROVENTIL) (2.5 MG/3ML) 0.083% nebulizer solution 2.5 mg (has no administration in time range)  lactated ringers infusion (has no administration in time range)  heparin ADULT infusion 100 units/mL (25000 units/220mL sodium chloride 0.45%) (has no administration in time range)  HYDROmorphone (DILAUDID) injection 1 mg (1 mg Intravenous  Given 04/23/20 2338)  iohexol (OMNIPAQUE) 300 MG/ML solution 100 mL (100 mLs Intravenous Contrast Given 04/24/20 0026)    Mobility walks     Focused Assessments    R Recommendations: See Admitting Provider Note  Report given to:   Additional Notes:

## 2020-04-24 NOTE — Progress Notes (Signed)
ANTICOAGULATION CONSULT NOTE -  Consult  Pharmacy Consult for Heparin Indication: pulmonary embolus  Allergies  Allergen Reactions  . Other     Blood Products  . Cefdinir Rash    Patient Measurements: Height: 5\' 4"  (162.6 cm) Weight: 83.7 kg (184 lb 8.4 oz) IBW/kg (Calculated) : 54.7 Heparin Dosing Weight:   Vital Signs: Temp: 98 F (36.7 C) (05/23 1728) Temp Source: Oral (05/23 1728) BP: 133/82 (05/23 1728) Pulse Rate: 85 (05/23 1728)  Labs: Recent Labs    04/23/20 2150 04/24/20 0538 04/24/20 1254 04/24/20 1813  HGB 12.9 12.6  --   --   HCT 40.7 38.5  --   --   PLT 431* 400  --   --   APTT 34  --  34 38*  LABPROT 14.3  --   --   --   INR 1.2  --   --   --   HEPARINUNFRC  --   --  0.64 0.40  CREATININE 0.91 0.89  --   --   TROPONINIHS 8 8  --   --     Estimated Creatinine Clearance: 70.4 mL/min (by C-G formula based on SCr of 0.89 mg/dL).   Medical History: History reviewed. No pertinent past medical history.  Medications:  Infusions:  . ampicillin-sulbactam (UNASYN) IV 3 g (04/24/20 1738)  . heparin    . [START ON 04/25/2020] vancomycin      Assessment: Patient with prior apixaban use with last dose noted 04/23/20 at 1030.   Today, 04/24/20  HL is 0.40, aPTT is 38, not yet correlating  No line or bleeding issues per RN   Goal of Therapy:  Heparin level 0.3-0.7 units/ml  PTT 66-102 sec Monitor platelets by anticoagulation protocol: Yes   Plan:   Increase heparin drip to 1200 units/hr  APTT/HL 6 hours after rate change   CBC with AM labs   Royetta Asal, PharmD, BCPS 04/24/2020 6:52 PM

## 2020-04-24 NOTE — ED Notes (Signed)
Patient transported to CT 

## 2020-04-24 NOTE — Progress Notes (Signed)
ANTICOAGULATION CONSULT NOTE - Initial Consult  Pharmacy Consult for Heparin Indication: pulmonary embolus  Allergies  Allergen Reactions  . Other     Blood Products  . Cefdinir Rash    Patient Measurements: Height: 5\' 4"  (162.6 cm) Weight: 83.7 kg (184 lb 8.4 oz) IBW/kg (Calculated) : 54.7 Heparin Dosing Weight:   Vital Signs: Temp: 98.2 F (36.8 C) (05/23 1303) Temp Source: Oral (05/23 1303) BP: 144/90 (05/23 1303) Pulse Rate: 82 (05/23 1303)  Labs: Recent Labs    04/23/20 2150 04/24/20 0538 04/24/20 1254  HGB 12.9 12.6  --   HCT 40.7 38.5  --   PLT 431* 400  --   APTT 34  --  34  LABPROT 14.3  --   --   INR 1.2  --   --   HEPARINUNFRC  --   --  0.64  CREATININE 0.91 0.89  --   TROPONINIHS 8 8  --     Estimated Creatinine Clearance: 70.4 mL/min (by C-G formula based on SCr of 0.89 mg/dL).   Medical History: History reviewed. No pertinent past medical history.  Medications:  Infusions:  . ampicillin-sulbactam (UNASYN) IV    . heparin    . lactated ringers 75 mL/hr at 04/24/20 0558  . [START ON 04/25/2020] vancomycin    . vancomycin 1,500 mg (04/24/20 1334)    Assessment: Patient with prior apixaban use with last dose noted 04/23/20 at 1030.   Today, 04/24/20  HL drip not started till 1229, so aPTT and HL dran at 1254 do not reflect meaningful anticoagulation  Will order HL and aPTT 6 hours after start of infusion at 1830   Goal of Therapy:  Heparin level 0.3-0.7 units/ml  PTT 66-102 sec Monitor platelets by anticoagulation protocol: Yes   Plan:   Continue heparin drip at 1000 units/hr  APTT/HL 6 hours after start of infusion   CBC with AM levels   Royetta Asal, PharmD, BCPS 04/24/2020 2:58 PM

## 2020-04-25 ENCOUNTER — Other Ambulatory Visit: Payer: Self-pay | Admitting: Radiation Therapy

## 2020-04-25 ENCOUNTER — Other Ambulatory Visit: Payer: Self-pay | Admitting: Oncology

## 2020-04-25 ENCOUNTER — Inpatient Hospital Stay (HOSPITAL_COMMUNITY): Payer: 59

## 2020-04-25 DIAGNOSIS — C7949 Secondary malignant neoplasm of other parts of nervous system: Secondary | ICD-10-CM

## 2020-04-25 DIAGNOSIS — C7931 Secondary malignant neoplasm of brain: Secondary | ICD-10-CM

## 2020-04-25 DIAGNOSIS — C3491 Malignant neoplasm of unspecified part of right bronchus or lung: Secondary | ICD-10-CM

## 2020-04-25 LAB — HEPATITIS PANEL, ACUTE
HCV Ab: REACTIVE — AB
Hep A IgM: NONREACTIVE
Hep B C IgM: NONREACTIVE
Hepatitis B Surface Ag: NONREACTIVE

## 2020-04-25 LAB — HEPARIN LEVEL (UNFRACTIONATED)
Heparin Unfractionated: 0.34 IU/mL (ref 0.30–0.70)
Heparin Unfractionated: 0.41 IU/mL (ref 0.30–0.70)

## 2020-04-25 LAB — COMPREHENSIVE METABOLIC PANEL
ALT: 63 U/L — ABNORMAL HIGH (ref 0–44)
AST: 37 U/L (ref 15–41)
Albumin: 2.6 g/dL — ABNORMAL LOW (ref 3.5–5.0)
Alkaline Phosphatase: 301 U/L — ABNORMAL HIGH (ref 38–126)
Anion gap: 9 (ref 5–15)
BUN: 5 mg/dL — ABNORMAL LOW (ref 6–20)
CO2: 29 mmol/L (ref 22–32)
Calcium: 8.4 mg/dL — ABNORMAL LOW (ref 8.9–10.3)
Chloride: 102 mmol/L (ref 98–111)
Creatinine, Ser: 0.75 mg/dL (ref 0.44–1.00)
GFR calc Af Amer: >60 mL/min (ref >60)
GFR calc non Af Amer: >60 mL/min (ref >60)
Glucose, Bld: 97 mg/dL (ref 70–99)
Potassium: 3.7 mmol/L (ref 3.5–5.1)
Sodium: 140 mmol/L (ref 135–145)
Total Bilirubin: 0.6 mg/dL (ref 0.3–1.2)
Total Protein: 5.8 g/dL — ABNORMAL LOW (ref 6.5–8.1)

## 2020-04-25 LAB — APTT
aPTT: 38 seconds — ABNORMAL HIGH (ref 24–36)
aPTT: 66 seconds — ABNORMAL HIGH (ref 24–36)

## 2020-04-25 LAB — CBC
HCT: 41.9 % (ref 36.0–46.0)
Hemoglobin: 13.3 g/dL (ref 12.0–15.0)
MCH: 29.2 pg (ref 26.0–34.0)
MCHC: 31.7 g/dL (ref 30.0–36.0)
MCV: 91.9 fL (ref 80.0–100.0)
Platelets: 404 10*3/uL — ABNORMAL HIGH (ref 150–400)
RBC: 4.56 MIL/uL (ref 3.87–5.11)
RDW: 13.1 % (ref 11.5–15.5)
WBC: 5.4 10*3/uL (ref 4.0–10.5)
nRBC: 0 % (ref 0.0–0.2)

## 2020-04-25 LAB — MAGNESIUM: Magnesium: 2.3 mg/dL (ref 1.7–2.4)

## 2020-04-25 LAB — PHOSPHORUS: Phosphorus: 3.9 mg/dL (ref 2.5–4.6)

## 2020-04-25 MED ORDER — VANCOMYCIN HCL IN DEXTROSE 1-5 GM/200ML-% IV SOLN
1000.0000 mg | Freq: Two times a day (BID) | INTRAVENOUS | Status: DC
  Administered 2020-04-25 – 2020-04-26 (×3): 1000 mg via INTRAVENOUS
  Filled 2020-04-25 (×3): qty 200

## 2020-04-25 MED ORDER — APIXABAN 5 MG PO TABS
10.0000 mg | ORAL_TABLET | Freq: Once | ORAL | Status: AC
  Administered 2020-04-25: 10 mg via ORAL
  Filled 2020-04-25: qty 2

## 2020-04-25 MED ORDER — APIXABAN 5 MG PO TABS: 5.0000 mg | ORAL_TABLET | Freq: Two times a day (BID) | ORAL | Status: DC

## 2020-04-25 MED ORDER — APIXABAN 5 MG PO TABS
5.0000 mg | ORAL_TABLET | Freq: Two times a day (BID) | ORAL | Status: DC
  Administered 2020-04-26: 5 mg via ORAL
  Filled 2020-04-25: qty 1

## 2020-04-25 NOTE — Evaluation (Signed)
Physical Therapy One Time Evaluation Patient Details Name: Brittany Mccarty MRN: [id number] DOB: 1961-03-08 Today's Date: 04/25/2020   History of Present Illness  59 y.o. 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  Right pleural catheter removed 04/24/20  Clinical Impression  Patient evaluated by Physical Therapy with no further acute PT needs identified. All education has been completed and the patient has no further questions.  Pt mobilizing independently and encouraged to ambulate in hallway during acute stay.  See below for any follow-up Physical Therapy or equipment needs. PT is signing off. Thank you for this referral.     Follow Up Recommendations No PT follow up    Equipment Recommendations  None recommended by PT    Recommendations for Other Services       Precautions / Restrictions Precautions Precautions: None      Mobility  Bed Mobility Overal bed mobility: Independent                Transfers Overall transfer level: Independent                  Ambulation/Gait Ambulation/Gait assistance: Independent Gait Distance (Feet): 600 Feet Assistive device: None Gait Pattern/deviations: WFL(Within Functional Limits)        Stairs            Wheelchair Mobility    Modified Rankin (Stroke Patients Only)       Balance Overall balance assessment: Modified Independent                                           Pertinent Vitals/Pain Pain Assessment: Faces Faces Pain Scale: Hurts a little bit Pain Location: R pleural catheter site Pain Descriptors / Indicators: Sore Pain Intervention(s): Monitored during session    Home Living Family/patient expects to be discharged to:: Private residence Living Arrangements: Spouse/significant  other   Type of Home: House       Home Layout: Multi-level Home Equipment: None      Prior Function Level of Independence: Independent         Comments: 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, newspaper        Extremity/Trunk Assessment        Lower Extremity Assessment Lower Extremity Assessment: Overall WFL for tasks assessed    Cervical / Trunk Assessment Cervical / Trunk Assessment: Normal  Communication   Communication: No difficulties  Cognition Arousal/Alertness: Awake/alert Behavior During Therapy: WFL for tasks assessed/performed Overall Cognitive Status: Within Functional Limits for tasks assessed                                        General Comments      Exercises     Assessment/Plan    PT Assessment Patent does not need any further PT services  PT Problem List         PT Treatment Interventions      PT Goals (Current goals can be found in the Care Plan section)  Acute Rehab PT Goals PT Goal Formulation: All assessment and education complete, DC therapy    Frequency  Barriers to discharge        Co-evaluation               AM-PAC PT "6 Clicks" Mobility  Outcome Measure Help needed turning from your back to your side while in a flat bed without using bedrails?: None Help needed moving from lying on your back to sitting on the side of a flat bed without using bedrails?: None Help needed moving to and from a bed to a chair (including a wheelchair)?: None Help needed standing up from a chair using your arms (Mccarty.g., wheelchair or bedside chair)?: None Help needed to walk in hospital room?: None Help needed climbing 3-5 steps with a railing? : None 6 Click Score: 24    End of Session   Activity Tolerance: Patient tolerated treatment well Patient left: in chair;with call bell/phone within reach   PT Visit Diagnosis: Difficulty in walking, not elsewhere classified (R26.2)    Time: 0383-3383 PT  Time Calculation (min) (ACUTE ONLY): 15 min   Charges:   PT Evaluation $PT Eval Low Complexity: 1 Low        Kati PT, DPT Acute Rehabilitation Services Office: [phone]   Anamarie Hunn,Brittany Mccarty 04/25/2020, 1:28 PM

## 2020-04-25 NOTE — Progress Notes (Signed)
Pharmacy Antibiotic Note  Brittany Mccarty is a 59 y.o. female admitted on 04/23/2020 with PMH significant of recently diagnosed metastatic adenocarcinoma of the lung and an acute PE who presents to the hospital with a chief complaint of right-sided pain and swelling around her Pleurx catheter site..  Pharmacy has been consulted for vancomycin and unasyn dosing for wound infection  Today, 04/25/20 -WBC WNL -SCr 0.75, CrCl ~78 mL/min -Afebrile  Plan:  Continue ampicillin/sulbactam 3 g IV q6h  Increase vancomycin to 1000 mg IV q12h for goal VT 15-20  Follow renal function, culture data   Height: 5\' 4"  (162.6 cm) Weight: 83.7 kg (184 lb 8.4 oz) IBW/kg (Calculated) : 54.7  Temp (24hrs), Avg:97.9 F (36.6 C), Min:97.5 F (36.4 C), Max:98.2 F (36.8 C)  Recent Labs  Lab 04/23/20 2150 04/24/20 0538 04/25/20 0249  WBC 6.6 6.0 5.4  CREATININE 0.91 0.89 0.75  LATICACIDVEN 1.3  --   --     Estimated Creatinine Clearance: 78.3 mL/min (by C-G formula based on SCr of 0.75 mg/dL).    Allergies  Allergen Reactions  . Other     Blood Products  . Cefdinir Rash    Antimicrobials this admission: 5/23 vancomycin >> 5/23 ampicillin/sulbactam >>  Dose adjustments this admission: 5/24: Vanc 1 g IV q24h --> Vanc 1 g IV q12h  Microbiology results: None  Lenis Noon, PharmD 04/25/20 7:45 AM

## 2020-04-25 NOTE — Progress Notes (Addendum)
ANTICOAGULATION CONSULT NOTE - Initial Consult  Pharmacy Consult for Heparin Indication: pulmonary embolus  Allergies  Allergen Reactions  . Other     Blood Products  . Cefdinir Rash    Patient Measurements: Height: 5\' 4"  (162.6 cm) Weight: 83.7 kg (184 lb 8.4 oz) IBW/kg (Calculated) : 54.7 Heparin Dosing Weight: 73 kg  Vital Signs: Temp: 97.9 F (36.6 C) (05/24 0504) Temp Source: Oral (05/24 0504) BP: 145/97 (05/24 0504) Pulse Rate: 77 (05/24 0504)  Labs: Recent Labs    04/23/20 2150 04/23/20 2150 04/24/20 0538 04/24/20 1254 04/24/20 1813 04/25/20 0249 04/25/20 1108  HGB 12.9   < > 12.6  --   --  13.3  --   HCT 40.7  --  38.5  --   --  41.9  --   PLT 431*  --  400  --   --  404*  --   APTT 34  --   --    < > 38* 38* 66*  LABPROT 14.3  --   --   --   --   --   --   INR 1.2  --   --   --   --   --   --   HEPARINUNFRC  --   --   --    < > 0.40 0.34 0.41  CREATININE 0.91  --  0.89  --   --  0.75  --   TROPONINIHS 8  --  8  --   --   --   --    < > = values in this interval not displayed.    Estimated Creatinine Clearance: 78.3 mL/min (by C-G formula based on SCr of 0.75 mg/dL).   Medications: Pt taking apixaban 5 mg PO BID PTA for hx PE Last dose: 5/22 @ 1030   Assessment: Pt has PMH significant for recently diagnosed metastatic adenocarcinoma of the lung as well as acute PE. Pt presented with right side pain/swelling/bruising around Pleurx catheter site (placed on 5/17). Anticoagulation transitioned to IV heparin on admission.   Baseline labs:  -CBC: Hgb (12.9), Plt (431) -Baseline HL elevated due to recent DOAC.   Today, 04/25/20  CBC: Hgb (13.3) WNL & stable. Plt (404) - stable, elevated  APTT = 66 seconds, HL = 0.41 are both therapeutic on heparin infusion of 1400 units/hr  Confirmed with RN that heparin infusing at correct rate with no issues/interruptions. No worsening of bruising.   Goal of Therapy:  Heparin level 0.3-0.7 units/ml  PTT 66-102  sec Monitor platelets by anticoagulation protocol: Yes   Plan:   Continue heparin infusion at current rate of 1400 units/hr  Check confirmatory HL in 6 hours. Since HL and aPTT correlating, will transition to monitoring using HL.  Monitor for signs/symptoms of bleeding   Follow along for ability to transition back to [place], PharmD 04/25/20 11:52 AM  Addendum: Pharmacy consulted to transition anticoagulation back to apixaban  Plan:  Turn off heparin at the time that apixaban is resumed  Resume apixaban 5 mg PO BID home dose  CBC ordered with AM labs tomorrow  Lenis Noon, PharmD 04/25/20 4:00 PM  2nd Addendum:  Spoke with patient via telephone to discuss current home dose of apixaban. Pt reports that she was recently diagnosed with PE and started taking the starter pack last Tuesday on 5/18.   Will give apixaban 10 mg PO once tonight to complete 7 day loading dose for PE treatment.  Apixaban 5 mg PO BID starting tomorrow morning.   Lenis Noon, PharmD 04/25/20 4:19 PM

## 2020-04-25 NOTE — Progress Notes (Signed)
ANTICOAGULATION CONSULT NOTE - Follow Up Consult  Pharmacy Consult for Heparin Indication: pulmonary embolus  Allergies  Allergen Reactions  . Other     Blood Products  . Cefdinir Rash    Patient Measurements: Height: 5\' 4"  (162.6 cm) Weight: 83.7 kg (184 lb 8.4 oz) IBW/kg (Calculated) : 54.7 Heparin Dosing Weight:   Vital Signs: Temp: 97.5 F (36.4 C) (05/23 1955) Temp Source: Oral (05/23 1955) BP: 133/94 (05/23 1955) Pulse Rate: 85 (05/23 1955)  Labs: Recent Labs    04/23/20 2150 04/23/20 2150 04/24/20 0538 04/24/20 1254 04/24/20 1813 04/25/20 0249  HGB 12.9   < > 12.6  --   --  13.3  HCT 40.7  --  38.5  --   --  41.9  PLT 431*  --  400  --   --  404*  APTT 34   < >  --  34 38* 38*  LABPROT 14.3  --   --   --   --   --   INR 1.2  --   --   --   --   --   HEPARINUNFRC  --   --   --  0.64 0.40 0.34  CREATININE 0.91  --  0.89  --   --  0.75  TROPONINIHS 8  --  8  --   --   --    < > = values in this interval not displayed.    Estimated Creatinine Clearance: 78.3 mL/min (by C-G formula based on SCr of 0.75 mg/dL).   Medications:  Infusions:  . ampicillin-sulbactam (UNASYN) IV 3 g (04/25/20 0244)  . heparin 1,200 Units/hr (04/24/20 1830)  . vancomycin      Assessment: Patient with low PTT while heparin level was at goal.  PTT ordered with Heparin level until both correlate due to possible drug-lab interaction between oral anticoagulant (rivaroxaban, edoxaban, or apixaban) and anti-Xa level (aka heparin level) No heparin issues per RN.  Goal of Therapy:  Heparin level 0.3-0.7 units/ml aPTT 66-102 seconds Monitor platelets by anticoagulation protocol: Yes   Plan:  Increase heparin to 1400 units/hr Recheck ptt at [address] [place] 04/25/2020,3:36 AM

## 2020-04-25 NOTE — Progress Notes (Signed)
PROGRESS NOTE    Brittany Mccarty  EXH:[id number] DOB: 1961-11-14 DOA: 04/23/2020 PCP: Patient, No Pcp Per   Chief Complaint  Patient presents with  . Lung Cancer    Pleurex drain swollen and bleeding    Brief Narrative:   Brittany Mccarty is Brittany Mccarty 59 y.o. 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  She reports having her Pleurx catheter placed on 5/17 with initially no complications however the following day she was started on anticoagulation with Eliquis for her pulmonary embolism.  She then noticed each time she drained her Pleurx catheter the fluid was bright red.  Brittany Mccarty couple of days later she started noticing increasing pain around her chest tube site along with significant bruising as well.  She states that yesterday she noticed her entire right side had turned purple.  She did want to go back to the emergency room and so she did not tell anybody about this until 5/22 when her daughter went to drain her Pleurx catheter.  When her daughter noticed all of the ecchymoses as well as the significant tenderness to minimal palpation they called [facility] who recommended the patient come to the closest emergency room.  She currently denies any difficulty breathing, nausea, vomiting, fever, chills, cough, dysuria.  She states that since starting her chemotherapy she actually feels much improved with less pain overall.  ED Course: In the ED 1 mg of IV Dilaudid for pain followed by CT of the chest abdomen pelvis.  CT revealed that her Pleurx catheter has one of the sidewalls located in the patient's chest wall leading to free fluid pockets of gas in this area.  No drainable fluid collection or abscess noted however cannot fully rule out infectious process although this is most likely related to the malpositioned  Pleurx catheter.  Given that the Pleurx catheter was placed at [facility] there was discussion regarding patient needing to be transferred there however she would like to transfer all her care to [facility] as her insurance does not cover [facility].  Reports not wanting to be sent back to [facility].  ED called pulmonology, Dr. Oletta Darter, who recommended admission and replacement of the Pleurx catheter.  Patient will be admitted to the hospital under the care of the hospitalist service with observation status for chest tube dysfunction and right-sided chest pain.  Assessment & Plan:   Active Problems:   Complication of chest tube   Adenocarcinoma of lung, right ([place])   Pulmonary embolism (HCC)   Pleuritic chest pain   Metastatic cancer ([place])  # Complication of chest tube - Patient with malposition of her Pleurx catheter with significant pleuritic chest pain as well. - free fluid and pockets of gas within patient's R chest wall and R flank - no well formed drainable fluid collection or abscess -> ? 2/2 malpositioned pleurx vs infectious process - vanc/unasyn to cover for possible infection  - will continue to follow on IV abx for another 24 hrs, follow pain/induration/bruising to R chest/flank - consider repeat imaging if not clearly improving vs follow up outpatient  - Pulm called by EDP overnight and recommended admission and replacement of pleurx - discussed with CT surg on call who noted nothing surgical from their standpoint at this time => recommended removing pleurx - IR also recommended removal of pleurx - appreciate assistance -  dressing should remain x 72 hrs, then routine wound care until healed - do not submerge for 7 days - may shower if ocvered with water tight dressing.  Change dressing prn if soiled.  It is normal to have some leakage of pleural fluid/bleeding post removal - call IR if leakage excessive. - CXR 5/24 with small R pleural effusion with R lung airspace disease - Pain  management -> 5 - 10 mg oxycodone prn, dilaudid 0.5 - 1 mg prn pain not improved with PO meds.  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  Smaller enhancing lesion at the parasagittal aspect of the R frontal lobe.   -hold tagrisso as treated for possible infection as noted above - she would like to transition care here to [facility] from [facility] - oncology consulted to assist in follow up, appreciate assistance - Will have follow up with radiation oncology and neuro oncology as well -> outpatient MRI planned on 5/28 per radiation oncology  #Pulmonary embolism -Small pulmonary embolism noted at previous hospitalization.  She was started on Eliquis for her pulmonary embolism.  She last took this on 5/22 around 8 AM. - will resume eliquis today   #Hyponatremia -resolved  # Elevated Liver Enzymes: continue to monitor, likely related to malignancy - improving, follow acute hepatitis panel  DVT prophylaxis: heparin gtt Code Status: full  Family Communication: daugther at bedside Disposition:   Status is: Observation  The patient will require care spanning > 2 midnights and should be moved to inpatient because: IV treatments appropriate due to intensity of illness or inability to take PO and Inpatient level of care appropriate due to severity of illness.  Change to inpatient, ED care started yesterday before midnight.   Pt will need additional inpatient care with need for IV abx and IV pain meds.  Dispo: The patient is from: Home              Anticipated d/c is to: Home              Anticipated d/c date is: 3 days              Patient currently is not medically stable to d/c.   Consultants:   IR  Procedures: Removal of tunneled R pleural catheter 5/23  Antimicrobials:  Anti-infectives (From admission, onward)   Start     Dose/Rate Route Frequency Ordered Stop   04/25/20 1200  vancomycin (VANCOCIN) IVPB 1000 mg/200 mL premix  Status:  Discontinued     1,000 mg 200 mL/hr over 60 Minutes Intravenous Every 24 hours 04/24/20 1222 04/25/20 0742   04/25/20 0900  vancomycin (VANCOCIN) IVPB 1000 mg/200 mL premix     1,000 mg 200 mL/hr over 60 Minutes Intravenous Every 12 hours 04/25/20 0742     04/24/20 1400  Ampicillin-Sulbactam (UNASYN) 3 g in sodium chloride 0.9 % 100 mL IVPB  3 g 200 mL/hr over 30 Minutes Intravenous Every 6 hours 04/24/20 1220     04/24/20 1300  vancomycin (VANCOCIN) IVPB 1000 mg/200 mL premix  Status:  Discontinued     1,000 mg 200 mL/hr over 60 Minutes Intravenous Every 24 hours 04/24/20 1220 04/24/20 1222   04/24/20 1230  vancomycin (VANCOREADY) IVPB 1500 mg/300 mL     1,500 mg 150 mL/hr over 120 Minutes Intravenous  Once 04/24/20 1222 04/24/20 1534     Subjective: C/o pain to R chest/flank  Objective: Vitals:   04/24/20 1728 04/24/20 1955 04/25/20 0504 04/25/20 1347  BP: 133/82 (!) 133/94 (!) 145/97 136/87  Pulse: 85 85 77 79  Resp: 18 16 14 18   Temp: 98 F (36.7 C) (!) 97.5 F (36.4 C) 97.9 F (36.6 C) (!) 97.5 F (36.4 C)  TempSrc: Oral Oral Oral Oral  SpO2: 96% 96% 96% 99%  Weight:      Height:        Intake/Output Summary (Last 24 hours) at 04/25/2020 1429 Last data filed at 04/24/2020 2015 Gross per 24 hour  Intake 1520.3 ml  Output --  Net 1520.3 ml   Filed Weights   04/23/20 2018 04/24/20 0515  Weight: 83.7 kg 83.7 kg     Examination:  General exam: Appears calm and comfortable  Respiratory system: Clear to auscultation. Respiratory effort normal. Cardiovascular system: S1 & S2 heard, RRR. Bruising/induration to R chest/breast/flank - tender to palpation  Gastrointestinal system: Abdomen is nondistended, soft and nontender. Central nervous system: Alert and oriented. No focal neurological deficits. Extremities: no LEE Skin: No rashes, lesions or ulcers Psychiatry: Judgement and insight appear normal. Mood & affect appropriate.     Data Reviewed: I have personally reviewed following labs and imaging studies  CBC: Recent Labs  Lab 04/23/20 2150 04/24/20 0538 04/25/20 0249  WBC 6.6 6.0 5.4  HGB 12.9 12.6 13.3  HCT 40.7 38.5 41.9  MCV 92.7 88.9 91.9  PLT 431* 400 404*    Basic Metabolic Panel: Recent Labs  Lab 04/23/20 2150 04/24/20 0538 04/25/20 0249  NA 134* 137 140  K 3.9 3.9 3.7  CL 98 102 102  CO2 29 25 29   GLUCOSE 101* 102* 97  BUN 10 9 5*  CREATININE 0.91 0.89 0.75  CALCIUM 7.9* 8.2* 8.4*  MG  --   --  2.3  PHOS  --   --  3.9    GFR: Estimated Creatinine Clearance: 78.3 mL/min (by C-G formula based on SCr of 0.75 mg/dL).  Liver Function Tests: Recent Labs  Lab 04/23/20 2150 04/25/20 0249  AST 59* 37  ALT 88* 63*  ALKPHOS 334* 301*  BILITOT 0.7 0.6  PROT 5.6* 5.8*  ALBUMIN 2.5* 2.6*    CBG: No results for input(s): GLUCAP in the last 168 hours.   Recent Results (from the past 240 hour(s))  SARS Coronavirus 2 by RT PCR (hospital order, performed in [facility] hospital lab) Nasopharyngeal Nasopharyngeal Swab     Status: None   Collection Time: 04/24/20  2:35 AM   Specimen: Nasopharyngeal Swab  Result Value Ref Range Status   SARS Coronavirus 2 NEGATIVE NEGATIVE Final    Comment: (NOTE) SARS-CoV-2 target nucleic acids are NOT DETECTED. The SARS-CoV-2 RNA is generally detectable in upper and lower respiratory specimens during the acute phase of  infection. The lowest concentration of SARS-CoV-2 viral copies this assay can detect is 250 copies / mL. Brittany Mccarty negative result does not preclude SARS-CoV-2 infection and should not  be used as the sole basis for treatment or other patient management decisions.  Brittany Mccarty negative result may occur with improper specimen collection / handling, submission of specimen other than nasopharyngeal swab, presence of viral mutation(s) within the areas targeted by this assay, and inadequate number of viral copies (<250 copies / mL). Brittany Mccarty negative result must be combined with clinical observations, patient history, and epidemiological information. Fact Sheet for Patients:   StrictlyIdeas.no Fact Sheet for Healthcare Providers: BankingDealers.co.za This test is not yet approved or cleared  by the [place] FDA and has been authorized for detection and/or diagnosis of SARS-CoV-2 by FDA under an Emergency Use Authorization (EUA).  This EUA will remain in effect (meaning this test can be used) for the duration of the COVID-19 declaration under Section 564(b)(1) of the Act, 21 U.S.C. section 360bbb-3(b)(1), unless the authorization is terminated or revoked sooner. Performed at [facility], [place] [address], [place], [place] [address]          Radiology Studies: CT CHEST W CONTRAST  Result Date: 04/24/2020 CLINICAL DATA:  Right PleurX drain in place. Swelling about the PleurX catheter. Increasing shortness of breath. On chemotherapy. EXAM: CT CHEST, ABDOMEN, AND PELVIS WITH CONTRAST TECHNIQUE: Multidetector CT imaging of the chest, abdomen and pelvis was performed following the standard protocol during bolus administration of intravenous contrast. CONTRAST:  113mL OMNIPAQUE IOHEXOL 300 MG/ML  SOLN COMPARISON:  February 18, 2009. FINDINGS: CT CHEST FINDINGS Cardiovascular: The heart size is normal. There is Calene Paradiso trace pericardial effusion. There is no  evidence for Brittany Mccarty thoracic aortic dissection or aneurysm. No evidence for large centrally located pulmonary embolism. Mediastinum/Nodes: --there is Brittany Mccarty centrally necrotic, peripherally calcified mass in the mediastinum measuring approximately 2.6 cm. Has significantly decreased in size since prior study in 2010. There is an enlarged subcarinal lymph node measuring approximately 1.7 cm. --No axillary lymphadenopathy. --No supraclavicular lymphadenopathy. --Normal thyroid gland. --The esophagus is unremarkable Lungs/Pleura: There is an ill-defined right infrahilar mass measuring approximately 5.5 x 4.5 cm (axial series 2, image 28). There are small areas of consolidation and ground-glass airspace opacities involving the bilateral upper lobes, right lower lobe, and right middle lobe. There are pulmonary nodules in the left lower lobe measuring up to approximately 7 mm (axial series 4, image 79). There is some interlobular septal thickening involving the right upper lobe. There is Zaeden Lastinger small right-sided pleural effusion. There is Afsheen Antony right-sided PleurX catheter in place. One of the sideholes of the PleurX catheter is located in the patient's chest wall. There is fluid and gas within the patient's right chest wall without evidence for Angellina Ferdinand definite well-formed drainable collection or abscess there is no right-sided pneumothorax. Musculoskeletal: There is Halo Laski small sclerotic lesion in the T7 vertebral body. There is no acute displaced fracture. There is asymmetric right sided skin thickening involving the breast. This is felt to be reactive. Review of the MIP images confirms the above findings. CT ABDOMEN PELVIS FINDINGS Hepatobiliary: There is Tieisha Darden small ill-defined hypodensity in hepatic segment 4A/B (axial series 2, image 48). Normal gallbladder.There is no biliary ductal dilation. Pancreas: Normal contours without ductal dilatation. No peripancreatic fluid collection. Spleen: There is Lanna Labella hypoattenuating lesion in the spleen measuring  approximately 1.2 cm. This is indeterminate on this exam. Adrenals/Urinary Tract: --Adrenal glands: Unremarkable. --Right kidney/ureter: No hydronephrosis or radiopaque kidney stones. --Left kidney/ureter: No hydronephrosis or radiopaque kidney stones. --Urinary bladder: Unremarkable. Stomach/Bowel: --Stomach/Duodenum: No hiatal hernia or other gastric abnormality. Normal duodenal course and caliber. --Small bowel: Unremarkable. --  Colon: Unremarkable. --Appendix: Normal. Vascular/Lymphatic: Normal course and caliber of the major abdominal vessels. --No retroperitoneal lymphadenopathy. --No mesenteric lymphadenopathy. --No pelvic or inguinal lymphadenopathy. Reproductive: Unremarkable Other: No ascites or free air. Mild body wall edema. Musculoskeletal. There is an area of sclerosis involving the L1 vertebral body, concerning for Sissy Goetzke metastatic lesion. There are sclerotic lesions in the right iliac bone and right hemi sacrum concerning for metastatic disease. IMPRESSION: 1. Malpositioned right-sided PleurX catheter. One of the sideholes is located within the patient's chest wall. There is free fluid pockets of gas within the patient's right chest wall and right flank. At this time, there is no well-formed drainable fluid collection or abscess. While the fluid in pockets of gas may be secondary to the malpositioned PleurX catheter, Brittany Mccarty developing infectious process is within the differential diagnosis. 2. Small right-sided pleural effusion.  No pneumothorax. 3. Findings consistent with metastatic lung cancer including Brittany Mccarty right infrahilar mass, multiple contralateral pulmonary nodules in the left lung in addition to sclerotic osseous lesions as detailed above. Correlation with the patient's prior outside imaging is recommended. There are ill-defined lesions in the liver and spleen which should be correlated with the patient's prior outside imaging. 4. Ground-glass airspace opacities with areas of consolidation are noted in  the right upper lobe and right middle lobe. These may represent post treatment changes or developing pneumonia in the appropriate clinical setting. Electronically Signed   By: Constance Holster M.D.   On: 04/24/2020 01:48   CT ABDOMEN PELVIS W CONTRAST  Result Date: 04/24/2020 CLINICAL DATA:  Right PleurX drain in place. Swelling about the PleurX catheter. Increasing shortness of breath. On chemotherapy. EXAM: CT CHEST, ABDOMEN, AND PELVIS WITH CONTRAST TECHNIQUE: Multidetector CT imaging of the chest, abdomen and pelvis was performed following the standard protocol during bolus administration of intravenous contrast. CONTRAST:  137mL OMNIPAQUE IOHEXOL 300 MG/ML  SOLN COMPARISON:  February 18, 2009. FINDINGS: CT CHEST FINDINGS Cardiovascular: The heart size is normal. There is Kden Wagster trace pericardial effusion. There is no evidence for Keiji Melland thoracic aortic dissection or aneurysm. No evidence for large centrally located pulmonary embolism. Mediastinum/Nodes: --there is Brittany Mccarty centrally necrotic, peripherally calcified mass in the mediastinum measuring approximately 2.6 cm. Has significantly decreased in size since prior study in 2010. There is an enlarged subcarinal lymph node measuring approximately 1.7 cm. --No axillary lymphadenopathy. --No supraclavicular lymphadenopathy. --Normal thyroid gland. --The esophagus is unremarkable Lungs/Pleura: There is an ill-defined right infrahilar mass measuring approximately 5.5 x 4.5 cm (axial series 2, image 28). There are small areas of consolidation and ground-glass airspace opacities involving the bilateral upper lobes, right lower lobe, and right middle lobe. There are pulmonary nodules in the left lower lobe measuring up to approximately 7 mm (axial series 4, image 79). There is some interlobular septal thickening involving the right upper lobe. There is Brittany Mccarty small right-sided pleural effusion. There is Aram Domzalski right-sided PleurX catheter in place. One of the sideholes of the PleurX catheter  is located in the patient's chest wall. There is fluid and gas within the patient's right chest wall without evidence for Delancey Moraes definite well-formed drainable collection or abscess there is no right-sided pneumothorax. Musculoskeletal: There is Catalina Salasar small sclerotic lesion in the T7 vertebral body. There is no acute displaced fracture. There is asymmetric right sided skin thickening involving the breast. This is felt to be reactive. Review of the MIP images confirms the above findings. CT ABDOMEN PELVIS FINDINGS Hepatobiliary: There is Kaeo Jacome small ill-defined hypodensity in hepatic segment 4A/B (axial  series 2, image 48). Normal gallbladder.There is no biliary ductal dilation. Pancreas: Normal contours without ductal dilatation. No peripancreatic fluid collection. Spleen: There is Brittany Mccarty hypoattenuating lesion in the spleen measuring approximately 1.2 cm. This is indeterminate on this exam. Adrenals/Urinary Tract: --Adrenal glands: Unremarkable. --Right kidney/ureter: No hydronephrosis or radiopaque kidney stones. --Left kidney/ureter: No hydronephrosis or radiopaque kidney stones. --Urinary bladder: Unremarkable. Stomach/Bowel: --Stomach/Duodenum: No hiatal hernia or other gastric abnormality. Normal duodenal course and caliber. --Small bowel: Unremarkable. --Colon: Unremarkable. --Appendix: Normal. Vascular/Lymphatic: Normal course and caliber of the major abdominal vessels. --No retroperitoneal lymphadenopathy. --No mesenteric lymphadenopathy. --No pelvic or inguinal lymphadenopathy. Reproductive: Unremarkable Other: No ascites or free air. Mild body wall edema. Musculoskeletal. There is an area of sclerosis involving the L1 vertebral body, concerning for Brittany Mccarty metastatic lesion. There are sclerotic lesions in the right iliac bone and right hemi sacrum concerning for metastatic disease. IMPRESSION: 1. Malpositioned right-sided PleurX catheter. One of the sideholes is located within the patient's chest wall. There is free fluid  pockets of gas within the patient's right chest wall and right flank. At this time, there is no well-formed drainable fluid collection or abscess. While the fluid in pockets of gas may be secondary to the malpositioned PleurX catheter, Jasmon Graffam developing infectious process is within the differential diagnosis. 2. Small right-sided pleural effusion.  No pneumothorax. 3. Findings consistent with metastatic lung cancer including Brittany Mccarty Neubauer right infrahilar mass, multiple contralateral pulmonary nodules in the left lung in addition to sclerotic osseous lesions as detailed above. Correlation with the patient's prior outside imaging is recommended. There are ill-defined lesions in the liver and spleen which should be correlated with the patient's prior outside imaging. 4. Ground-glass airspace opacities with areas of consolidation are noted in the right upper lobe and right middle lobe. These may represent post treatment changes or developing pneumonia in the appropriate clinical setting. Electronically Signed   By: Constance Holster M.D.   On: 04/24/2020 01:48   DG CHEST PORT 1 VIEW  Result Date: 04/25/2020 CLINICAL DATA:  Right pleural effusion EXAM: PORTABLE CHEST 1 VIEW COMPARISON:  04/24/2020 FINDINGS: Cardiomegaly. Small right pleural effusion. Right lung airspace disease again noted, unchanged. Left lung clear. No acute bony abnormality. IMPRESSION: Stable small right pleural effusion with right lung airspace disease. Electronically Signed   By: Rolm Baptise M.D.   On: 04/25/2020 09:15   DG Chest Port 1 View  Result Date: 04/24/2020 CLINICAL DATA:  Right pleural effusion, removed partially dislodged drain catheter EXAM: PORTABLE CHEST 1 VIEW COMPARISON:  04/23/2020 FINDINGS: The heart size and mediastinal contours are within normal limits. Interval removal of Fumio Vandam previously seen right-sided tunneled pleural catheter. Unchanged small right pleural effusion associated atelectasis or consolidation. Left lung is normally  aerated. The visualized skeletal structures are unremarkable. IMPRESSION: Interval removal of Shykeria Sakamoto previously seen right-sided tunneled pleural catheter. Unchanged small right pleural effusion and associated atelectasis or consolidation. Electronically Signed   By: Eddie Candle M.D.   On: 04/24/2020 13:54   DG Chest Port 1 View  Result Date: 04/23/2020 CLINICAL DATA:  Status post right-sided chest drain placed with subsequent right-sided chest swelling. EXAM: PORTABLE CHEST 1 VIEW COMPARISON:  August 24, 2013 FINDINGS: Zandon Talton right-sided chest tube is seen with its distal tip overlying the medial aspect of the right lung base. There is mild right basilar atelectasis with Otto Felkins small right pleural effusion. Mild right apical pleural fluid is also seen. Mild atelectasis is seen within the mid right lung. Mild right-sided volume loss is  seen. No pneumothorax is identified. The heart size and mediastinal contours are within normal limits. The visualized skeletal structures are unremarkable. IMPRESSION: 1. Right-sided chest tube placement. 2. Mild right basilar atelectasis with Davonta Stroot small right pleural effusion. 3. Mild atelectasis within the mid right lung. Electronically Signed   By: Virgina Norfolk M.D.   On: 04/23/2020 21:13        Scheduled Meds: . acetaminophen  500 mg Oral Q6H  . famotidine  40 mg Oral BID  . folic acid  1 mg Oral Daily   Continuous Infusions: . ampicillin-sulbactam (UNASYN) IV 3 g (04/25/20 1413)  . heparin 1,400 Units/hr (04/25/20 0806)  . vancomycin 1,000 mg (04/25/20 0958)     LOS: 1 day    Time spent: over 30 min    Fayrene Helper, MD [facility]   To contact the attending provider between 7A-7P or the covering provider during after hours 7P-7A, please log into the web site [email or web address] and access using universal  password for that web site. If you do not have the password, please call the hospital operator.  04/25/2020, 2:29 PM

## 2020-04-25 NOTE — Progress Notes (Signed)
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 Scans show bone, node and lung metastases as well as two small (0.7 cm) brain lesions. I oriented Brittany Mccarty to our thoracic oncology group, she has already been contacted by Rad On[facility] with brain MRI scheduled 5/28 and presentation at tumor board 06/01; she will also be contacted by Dr Worthy Flank office for an outpatient appointment with him.  May discharge at your discretion.

## 2020-04-25 NOTE — Progress Notes (Signed)
I spoke with the patient today to introduce our team to this patient. 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 She developed shortness of breath while on vacation in [place], and she presented to [place] and was worked up and found to have a right effusion. 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 She came home and a family member recommended she go to [facility]. She did present but has decided that care is too far in [place]. She did have a PET scan and brain MRI, both of which had not been reviewed by the ordering team at [facility], so today we reviewed these reports. Interestingly she started Tagrisso after the pharmacy called her to tell her this was ready for pickup, but she denies any conversation with her oncology team at [facility]. For this reason as well she's decided to relocate care closer to home. On her PET, there are concerns for disease in the chest, regional thoracic nodes, multiple bone metastases including ribs, L1 and L3 lumbar spine, iliac bones, and sternum. She has liver disease, and MRI brain revealed at least one metastastatic lesion, 27m in greatest dimension in the frontal lobe, possibly a second. She may be a candidate for stereotactic radiosurgery and I reached out to let her know we were aware of her case. She was in agreement to consider radiotherapy in the palliative setting to bone lesions, as she's having hip, low back, and rib pain, and for stereotactic radiosurgery to the brain. She is also interested in more formal discussion in the presence of her family, and to have a 3T MRI as we contemplate therapy. She will have 3T MRI on Friday as an outpatient, and we will meet to review these results on Tuesday next week. She is in agreement with this plan.    ACarola Rhine PAC

## 2020-04-26 ENCOUNTER — Telehealth: Payer: Self-pay | Admitting: *Deleted

## 2020-04-26 ENCOUNTER — Encounter: Payer: Self-pay | Admitting: *Deleted

## 2020-04-26 ENCOUNTER — Inpatient Hospital Stay (HOSPITAL_COMMUNITY): Payer: 59

## 2020-04-26 ENCOUNTER — Other Ambulatory Visit: Payer: Self-pay

## 2020-04-26 LAB — COMPREHENSIVE METABOLIC PANEL
ALT: 49 U/L — ABNORMAL HIGH (ref 0–44)
AST: 32 U/L (ref 15–41)
Albumin: 2.4 g/dL — ABNORMAL LOW (ref 3.5–5.0)
Alkaline Phosphatase: 245 U/L — ABNORMAL HIGH (ref 38–126)
Anion gap: 8 (ref 5–15)
BUN: <5 mg/dL — ABNORMAL LOW (ref 6–20)
CO2: 29 mmol/L (ref 22–32)
Calcium: 8.7 mg/dL — ABNORMAL LOW (ref 8.9–10.3)
Chloride: 103 mmol/L (ref 98–111)
Creatinine, Ser: 0.98 mg/dL (ref 0.44–1.00)
GFR calc Af Amer: >60 mL/min (ref >60)
GFR calc non Af Amer: >60 mL/min (ref >60)
Glucose, Bld: 95 mg/dL (ref 70–99)
Potassium: 4.1 mmol/L (ref 3.5–5.1)
Sodium: 140 mmol/L (ref 135–145)
Total Bilirubin: 0.5 mg/dL (ref 0.3–1.2)
Total Protein: 5.5 g/dL — ABNORMAL LOW (ref 6.5–8.1)

## 2020-04-26 LAB — CBC
HCT: 38.9 % (ref 36.0–46.0)
Hemoglobin: 12.3 g/dL (ref 12.0–15.0)
MCH: 29.1 pg (ref 26.0–34.0)
MCHC: 31.6 g/dL (ref 30.0–36.0)
MCV: 92.2 fL (ref 80.0–100.0)
Platelets: 336 10*3/uL (ref 150–400)
RBC: 4.22 MIL/uL (ref 3.87–5.11)
RDW: 13.2 % (ref 11.5–15.5)
WBC: 4.4 10*3/uL (ref 4.0–10.5)
nRBC: 0 % (ref 0.0–0.2)

## 2020-04-26 LAB — PHOSPHORUS: Phosphorus: 4 mg/dL (ref 2.5–4.6)

## 2020-04-26 LAB — MAGNESIUM: Magnesium: 2.2 mg/dL (ref 1.7–2.4)

## 2020-04-26 MED ORDER — AMOXICILLIN-POT CLAVULANATE 875-125 MG PO TABS: 1.0000 | ORAL_TABLET | Freq: Two times a day (BID) | ORAL | 0 refills | Status: AC

## 2020-04-26 MED ORDER — OXYCODONE HCL 5 MG PO TABS: 5.0000 mg | ORAL_TABLET | ORAL | 0 refills | Status: AC | PRN

## 2020-04-26 NOTE — Progress Notes (Signed)
I spoke with Mikey Bussing Np regarding an appt for Ms. Cid with Dr. Julien Nordmann. She will update patient.

## 2020-04-26 NOTE — Telephone Encounter (Signed)
I received referral on Brittany Mccarty yesterday. I called to schedule an appt with Dr. Julien Nordmann on 6/1 but was unable to reach.  I was unable to leave vm message as well.

## 2020-04-26 NOTE — Discharge Summary (Signed)
Physician Discharge Summary  Noel Henandez ZWC:[id number] DOB: 06-Mar-1961 DOA: 04/23/2020  PCP: Patient, No Pcp Per  Admit date: 04/23/2020 Discharge date: 04/26/2020  Time spent: 40 minutes  Recommendations for Outpatient Follow-up:  1. Follow outpatient CBC/CMP 2. Follow up with oncology, rad onc, neuro oncology as scheduled 3. 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 Of note, pt is Jehovah's Witness  5. Follow chest imaging outpatient   Discharge Diagnoses:  Active Problems:   Complication of chest tube   Adenocarcinoma of lung, right ([place])   Pulmonary embolism (HCC)   Pleuritic chest pain   Metastatic cancer [facility])   Discharge Condition: stable  Diet recommendation: heart healthy  Filed Weights   04/23/20 2018 04/24/20 0515  Weight: 83.7 kg 83.7 kg    History of present illness:  Brittany Mccarty Brittany Mccarty 59 y.o.femalewith medical history significant ofrecently diagnosed metastatic adenocarcinoma of the lung and an acute PE who presents to the hospital with Brayon Bielefeld chief complaint of right-sided pain and swelling around her Pleurx catheter site. She reports having her Pleurx catheter placed on 5/17 with initially no complications however the following day she was started on anticoagulation with Eliquis for her pulmonary embolism. She then noticed each time she drained her Pleurx catheter the fluid was bright red. Skilynn Durney couple of days later she started noticing increasing pain around her chest tube site along with significant bruising as well. She states that yesterday she noticed her entire right side had turned purple. She did want to go back to the emergency room and so she did not tell anybody about this until 5/22 when her daughter went to drain her Pleurx catheter. When her daughter noticed all of the ecchymoses as well as the significant tenderness to minimal palpation they called [facility] who recommended the patient come to the closest emergency room.She currently  denies any difficulty breathing, nausea, vomiting, fever, chills, cough, dysuria. She states that since starting her chemotherapy she actually feels much improved with less pain overall.  ED Course:In the ED 1 mg of IV Dilaudid for pain followed by CT of the chest abdomen pelvis. CT revealed that her Pleurx catheter has one of the sidewalls located in the patient's chest wall leading to free fluid pockets of gas in this area. No drainable fluid collection or abscess noted however cannot fully rule out infectious process although this is most likely related to the malpositioned Pleurx catheter. Given that the Pleurx catheter was placed at [facility] there was discussion regarding patient needing to be transferred there however she would like to transfer all her care to [facility] as her insurance does not cover [facility]. Reports not wanting to be sent back to [facility]. ED called pulmonology, Dr. Oletta Darter, who recommended admission and replacement of the Pleurx catheter.  Patient will be admitted to the hospital under the care of the hospitalist service with observation status for chest tube dysfunction and right-sided chest pain.  She was admitted for right sided chest and flank pain.  She was noted to have Philopateer Strine malpositioned right sided pleurx.  There was free fluid and pockets of gas within the patient's right chest wall and right flank.  The chest tube was removed by IR and she was started on broad spectrum abx.  She was improving on 5/25 and discharged with PO antibiotics.  Hospital Course:  # Complication of chest tube - Patient with malposition of her Pleurx catheter with significant pleuritic chest pain as well. - free fluid  and pockets of gas within patient's R chest wall and R flank - no well formed drainable fluid collection or abscess -> ? 2/2 malpositioned pleurx vs infectious process - vanc/unasyn to cover for possible infection  - appears to be gradually improving, will discharge with  abx and plan for follow up - return precautions given  - Pulm called by EDP overnight and recommended admission and replacement of pleurx - discussed with CT surg on call who noted nothing surgical from their standpoint at this time => recommended removing pleurx - IR also recommended removal of pleurx - appreciate assistance - dressing should remain x 72 hrs, then routine wound care until healed - do not submerge for 7 days - may shower if ocvered with water tight dressing.  Change dressing prn if soiled.  It is normal to have some leakage of pleural fluid/bleeding post removal - call IR if leakage excessive. - CXR 5/24 with small R pleural effusion with R lung airspace disease - Pain management -> 5 - 10 mg oxycodone prn, dilaudid 0.5 - 1 mg prn pain not improved with PO meds.  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  Smaller enhancing lesion at the parasagittal aspect of the R frontal lobe.   -resume tagrisso, low suspicion for infection - she would like to transition care here to [facility] from [facility] - oncology consulted to assist in follow up, appreciate assistance - Will have follow up with radiation oncology and neuro oncology as well -> outpatient MRI planned on 5/28 per radiation oncology  #Pulmonary embolism -Small pulmonary embolism noted at previous hospitalization. She was started on Eliquis for her  pulmonary embolism. She last took this on 5/22 around 8 AM. - will resume eliquis    #Hyponatremia -resolved  # Elevated Liver Enzymes  Hep C ab positive: continue to monitor, likely related to malignancy - improving. Follow hep c viral load  Procedures: 5/23 removal of tunneled R pleural catheter  Consultations:  IR  oncology  Discharge Exam: Vitals:   04/25/20 2137 04/26/20 0653  BP: 132/85 (!) 144/88  Pulse:  85  Resp: 16 17  Temp: 97.6 F (36.4 C) (!) 97.3 F (36.3 C)  SpO2: 97% 100%   Looking forward to d/c home Discussed d/c recs Discussed need to follow hep c viral load, encouraged to discuss with husband and he should be tested as well  General: No acute distress. Cardiovascular: Heart sounds show Donnica Jarnagin regular rate, and rhythm.  Lungs: Clear to auscultation bilaterally  Abdomen: Soft, nontender, nondistended  Neurological: Alert and oriented 3. Moves all extremities 4 Cranial nerves II through XII grossly intact. Skin: Warm and dry. No rashes or lesions. Extremities: No clubbing or cyanosis. No edema.   Discharge Instructions   Discharge Instructions    Call MD for:  difficulty breathing, headache or visual disturbances   Complete by: As directed  Call MD for:  extreme fatigue   Complete by: As directed    Call MD for:  hives   Complete by: As directed    Call MD for:  persistant dizziness or light-headedness   Complete by: As directed    Call MD for:  persistant nausea and vomiting   Complete by: As directed    Call MD for:  redness, tenderness, or signs of infection (pain, swelling, redness, odor or green/yellow discharge around incision site)   Complete by: As directed    Call MD for:  severe uncontrolled pain   Complete by: As directed    Call MD for:  temperature >100.4   Complete by: As directed    Diet - low sodium heart healthy   Complete by: As directed    Discharge instructions   Complete by: As directed    You were seen for  right sided chest and flank pain.  You've improved with removal of the chest tube and antibiotics.  We'll continue your antibiotics at discharge to cover for Harlie Ragle possible infection, though I think the fluid and air in your chest wall is probably more likely related to the chest tube.   Resume your tagrisso.   You have positive hepatitis C antibody.  We'll need to follow the viral load to see if you have active infection.  Please follow these results with your oncologist.  Please follow up with oncology as scheduled.  Dr. Worthy Flank office should call you to schedule an appointment.  You have imaging and appointments set up with radiation oncology.  You'll need follow up chest imaging to monitor your fluid on your lung.    Return for new, recurrent, or worsening symptoms.  If you have fevers or worsening pain, you need to return for imaging.  Please ask your PCP to request records from this hospitalization so they know what was done and what the next steps will be.   Increase activity slowly   Complete by: As directed      Allergies as of 04/26/2020      Reactions   Other    Blood Products   Cefdinir Rash      Medication List    STOP taking these medications   moxifloxacin 400 MG tablet Commonly known as: Avelox   oxyCODONE-acetaminophen 7.5-325 MG tablet Commonly known as: PERCOCET     TAKE these medications   albuterol 108 (90 Base) MCG/ACT inhaler Commonly known as: VENTOLIN HFA Inhale 1-2 puffs into the lungs every 6 (six) hours as needed for wheezing.   amoxicillin-clavulanate 875-125 MG tablet Commonly known as: Augmentin Take 1 tablet by mouth 2 (two) times daily for 3 days.   beclomethasone 80 MCG/ACT inhaler Commonly known as: Qvar INhale One puff into lungs daily   benzonatate 100 MG capsule Commonly known as: TESSALON Take 100 mg by mouth 3 (three) times daily as needed for cough.   Eliquis 5 MG Tabs tablet Generic drug: apixaban Take 5 mg by mouth 2 (two)  times daily.   famotidine 40 MG tablet Commonly known as: PEPCID Take 40 mg by mouth 2 (two) times daily.   folic acid 1 MG tablet Commonly known as: FOLVITE Take 1 mg by mouth daily.   ondansetron 4 MG tablet Commonly known as: ZOFRAN Take 4 mg by mouth every 8 (eight) hours as needed for nausea or vomiting.   oxyCODONE 5 MG immediate release tablet Commonly known as: Roxicodone Take 1-2 tablets (5-10 mg total) by mouth  every 4 (four) hours as needed for up to 5 days for severe pain.   Tagrisso 80 MG tablet Generic drug: osimertinib mesylate Take 80 mg by mouth daily.      Allergies  Allergen Reactions  . Other     Blood Products  . Cefdinir Rash   Follow-up Information    Curt Bears, MD Follow up.   Specialty: Oncology Why: Dr. Worthy Flank office should call with Sherene Plancarte follow up appointment Contact information: [place] [address] [phone]        Kyung Rudd, MD Follow up.   Specialty: Radiation Oncology Why: follow up with radiation oncology as scheduled Contact information: [address] [place] [address] [phone]            The results of significant diagnostics from this hospitalization (including imaging, microbiology, ancillary and laboratory) are listed below for reference.    Significant Diagnostic Studies: CT CHEST W CONTRAST  Result Date: 04/24/2020 CLINICAL DATA:  Right PleurX drain in place. Swelling about the PleurX catheter. Increasing shortness of breath. On chemotherapy. EXAM: CT CHEST, ABDOMEN, AND PELVIS WITH CONTRAST TECHNIQUE: Multidetector CT imaging of the chest, abdomen and pelvis was performed following the standard protocol during bolus administration of intravenous contrast. CONTRAST:  136mL OMNIPAQUE IOHEXOL 300 MG/ML  SOLN COMPARISON:  February 18, 2009. FINDINGS: CT CHEST FINDINGS Cardiovascular: The heart size is normal. There is Tracey Stewart trace pericardial effusion. There is no evidence for Mayson Mcneish  thoracic aortic dissection or aneurysm. No evidence for large centrally located pulmonary embolism. Mediastinum/Nodes: --there is Lamin Chandley centrally necrotic, peripherally calcified mass in the mediastinum measuring approximately 2.6 cm. Has significantly decreased in size since prior study in 2010. There is an enlarged subcarinal lymph node measuring approximately 1.7 cm. --No axillary lymphadenopathy. --No supraclavicular lymphadenopathy. --Normal thyroid gland. --The esophagus is unremarkable Lungs/Pleura: There is an ill-defined right infrahilar mass measuring approximately 5.5 x 4.5 cm (axial series 2, image 28). There are small areas of consolidation and ground-glass airspace opacities involving the bilateral upper lobes, right lower lobe, and right middle lobe. There are pulmonary nodules in the left lower lobe measuring up to approximately 7 mm (axial series 4, image 79). There is some interlobular septal thickening involving the right upper lobe. There is Josaiah Muhammed small right-sided pleural effusion. There is Cristiano Capri right-sided PleurX catheter in place. One of the sideholes of the PleurX catheter is located in the patient's chest wall. There is fluid and gas within the patient's right chest wall without evidence for Nevaeh Casillas definite well-formed drainable collection or abscess there is no right-sided pneumothorax. Musculoskeletal: There is Avah Bashor small sclerotic lesion in the T7 vertebral body. There is no acute displaced fracture. There is asymmetric right sided skin thickening involving the breast. This is felt to be reactive. Review of the MIP images confirms the above findings. CT ABDOMEN PELVIS FINDINGS Hepatobiliary: There is Latima Hamza small ill-defined hypodensity in hepatic segment 4A/B (axial series 2, image 48). Normal gallbladder.There is no biliary ductal dilation. Pancreas: Normal contours without ductal dilatation. No peripancreatic fluid collection. Spleen: There is Jaedan Huttner hypoattenuating lesion in the spleen measuring approximately 1.2  cm. This is indeterminate on this exam. Adrenals/Urinary Tract: --Adrenal glands: Unremarkable. --Right kidney/ureter: No hydronephrosis or radiopaque kidney stones. --Left kidney/ureter: No hydronephrosis or radiopaque kidney stones. --Urinary bladder: Unremarkable. Stomach/Bowel: --Stomach/Duodenum: No hiatal hernia or other gastric abnormality. Normal duodenal course and caliber. --Small bowel: Unremarkable. --Colon: Unremarkable. --Appendix: Normal. Vascular/Lymphatic: Normal course and caliber of the major abdominal vessels. --  No retroperitoneal lymphadenopathy. --No mesenteric lymphadenopathy. --No pelvic or inguinal lymphadenopathy. Reproductive: Unremarkable Other: No ascites or free air. Mild body wall edema. Musculoskeletal. There is an area of sclerosis involving the L1 vertebral body, concerning for Sulma Ruffino metastatic lesion. There are sclerotic lesions in the right iliac bone and right hemi sacrum concerning for metastatic disease. IMPRESSION: 1. Malpositioned right-sided PleurX catheter. One of the sideholes is located within the patient's chest wall. There is free fluid pockets of gas within the patient's right chest wall and right flank. At this time, there is no well-formed drainable fluid collection or abscess. While the fluid in pockets of gas may be secondary to the malpositioned PleurX catheter, Jamaris Biernat developing infectious process is within the differential diagnosis. 2. Small right-sided pleural effusion.  No pneumothorax. 3. Findings consistent with metastatic lung cancer including Jsoeph Podesta right infrahilar mass, multiple contralateral pulmonary nodules in the left lung in addition to sclerotic osseous lesions as detailed above. Correlation with the patient's prior outside imaging is recommended. There are ill-defined lesions in the liver and spleen which should be correlated with the patient's prior outside imaging. 4. Ground-glass airspace opacities with areas of consolidation are noted in the right upper  lobe and right middle lobe. These may represent post treatment changes or developing pneumonia in the appropriate clinical setting. Electronically Signed   By: Constance Holster M.D.   On: 04/24/2020 01:48   CT ABDOMEN PELVIS W CONTRAST  Result Date: 04/24/2020 CLINICAL DATA:  Right PleurX drain in place. Swelling about the PleurX catheter. Increasing shortness of breath. On chemotherapy. EXAM: CT CHEST, ABDOMEN, AND PELVIS WITH CONTRAST TECHNIQUE: Multidetector CT imaging of the chest, abdomen and pelvis was performed following the standard protocol during bolus administration of intravenous contrast. CONTRAST:  193mL OMNIPAQUE IOHEXOL 300 MG/ML  SOLN COMPARISON:  February 18, 2009. FINDINGS: CT CHEST FINDINGS Cardiovascular: The heart size is normal. There is Andrell Tallman trace pericardial effusion. There is no evidence for Farren Landa thoracic aortic dissection or aneurysm. No evidence for large centrally located pulmonary embolism. Mediastinum/Nodes: --there is Coby Shrewsberry centrally necrotic, peripherally calcified mass in the mediastinum measuring approximately 2.6 cm. Has significantly decreased in size since prior study in 2010. There is an enlarged subcarinal lymph node measuring approximately 1.7 cm. --No axillary lymphadenopathy. --No supraclavicular lymphadenopathy. --Normal thyroid gland. --The esophagus is unremarkable Lungs/Pleura: There is an ill-defined right infrahilar mass measuring approximately 5.5 x 4.5 cm (axial series 2, image 28). There are small areas of consolidation and ground-glass airspace opacities involving the bilateral upper lobes, right lower lobe, and right middle lobe. There are pulmonary nodules in the left lower lobe measuring up to approximately 7 mm (axial series 4, image 79). There is some interlobular septal thickening involving the right upper lobe. There is Babs Dabbs small right-sided pleural effusion. There is Sherika Kubicki right-sided PleurX catheter in place. One of the sideholes of the PleurX catheter is located in  the patient's chest wall. There is fluid and gas within the patient's right chest wall without evidence for Marisue Canion definite well-formed drainable collection or abscess there is no right-sided pneumothorax. Musculoskeletal: There is Lacrisha Bielicki small sclerotic lesion in the T7 vertebral body. There is no acute displaced fracture. There is asymmetric right sided skin thickening involving the breast. This is felt to be reactive. Review of the MIP images confirms the above findings. CT ABDOMEN PELVIS FINDINGS Hepatobiliary: There is Lariyah Shetterly small ill-defined hypodensity in hepatic segment 4A/B (axial series 2, image 48). Normal gallbladder.There is no biliary ductal dilation. Pancreas: Normal contours  without ductal dilatation. No peripancreatic fluid collection. Spleen: There is Denielle Bayard hypoattenuating lesion in the spleen measuring approximately 1.2 cm. This is indeterminate on this exam. Adrenals/Urinary Tract: --Adrenal glands: Unremarkable. --Right kidney/ureter: No hydronephrosis or radiopaque kidney stones. --Left kidney/ureter: No hydronephrosis or radiopaque kidney stones. --Urinary bladder: Unremarkable. Stomach/Bowel: --Stomach/Duodenum: No hiatal hernia or other gastric abnormality. Normal duodenal course and caliber. --Small bowel: Unremarkable. --Colon: Unremarkable. --Appendix: Normal. Vascular/Lymphatic: Normal course and caliber of the major abdominal vessels. --No retroperitoneal lymphadenopathy. --No mesenteric lymphadenopathy. --No pelvic or inguinal lymphadenopathy. Reproductive: Unremarkable Other: No ascites or free air. Mild body wall edema. Musculoskeletal. There is an area of sclerosis involving the L1 vertebral body, concerning for Graciela Plato metastatic lesion. There are sclerotic lesions in the right iliac bone and right hemi sacrum concerning for metastatic disease. IMPRESSION: 1. Malpositioned right-sided PleurX catheter. One of the sideholes is located within the patient's chest wall. There is free fluid pockets of gas  within the patient's right chest wall and right flank. At this time, there is no well-formed drainable fluid collection or abscess. While the fluid in pockets of gas may be secondary to the malpositioned PleurX catheter, Burton Gahan developing infectious process is within the differential diagnosis. 2. Small right-sided pleural effusion.  No pneumothorax. 3. Findings consistent with metastatic lung cancer including Taunia Frasco right infrahilar mass, multiple contralateral pulmonary nodules in the left lung in addition to sclerotic osseous lesions as detailed above. Correlation with the patient's prior outside imaging is recommended. There are ill-defined lesions in the liver and spleen which should be correlated with the patient's prior outside imaging. 4. Ground-glass airspace opacities with areas of consolidation are noted in the right upper lobe and right middle lobe. These may represent post treatment changes or developing pneumonia in the appropriate clinical setting. Electronically Signed   By: Constance Holster M.D.   On: 04/24/2020 01:48   DG CHEST PORT 1 VIEW  Result Date: 04/25/2020 CLINICAL DATA:  Right pleural effusion EXAM: PORTABLE CHEST 1 VIEW COMPARISON:  04/24/2020 FINDINGS: Cardiomegaly. Small right pleural effusion. Right lung airspace disease again noted, unchanged. Left lung clear. No acute bony abnormality. IMPRESSION: Stable small right pleural effusion with right lung airspace disease. Electronically Signed   By: Rolm Baptise M.D.   On: 04/25/2020 09:15   DG Chest Port 1 View  Result Date: 04/24/2020 CLINICAL DATA:  Right pleural effusion, removed partially dislodged drain catheter EXAM: PORTABLE CHEST 1 VIEW COMPARISON:  04/23/2020 FINDINGS: The heart size and mediastinal contours are within normal limits. Interval removal of Demico Ploch previously seen right-sided tunneled pleural catheter. Unchanged small right pleural effusion associated atelectasis or consolidation. Left lung is normally aerated. The  visualized skeletal structures are unremarkable. IMPRESSION: Interval removal of Cohen Boettner previously seen right-sided tunneled pleural catheter. Unchanged small right pleural effusion and associated atelectasis or consolidation. Electronically Signed   By: Eddie Candle M.D.   On: 04/24/2020 13:54   DG Chest Port 1 View  Result Date: 04/23/2020 CLINICAL DATA:  Status post right-sided chest drain placed with subsequent right-sided chest swelling. EXAM: PORTABLE CHEST 1 VIEW COMPARISON:  August 24, 2013 FINDINGS: Millan Legan right-sided chest tube is seen with its distal tip overlying the medial aspect of the right lung base. There is mild right basilar atelectasis with Ardra Kuznicki small right pleural effusion. Mild right apical pleural fluid is also seen. Mild atelectasis is seen within the mid right lung. Mild right-sided volume loss is seen. No pneumothorax is identified. The heart size and mediastinal contours are within normal  limits. The visualized skeletal structures are unremarkable. IMPRESSION: 1. Right-sided chest tube placement. 2. Mild right basilar atelectasis with Vickey Boak small right pleural effusion. 3. Mild atelectasis within the mid right lung. Electronically Signed   By: Virgina Norfolk M.D.   On: 04/23/2020 21:13   IR Removal Of Plural Cath W/Cuff  Result Date: 04/26/2020 INDICATION: Patient with history of metastatic adenocarcinoma the with recurrent right-sided pleural effusion; status post tunneled pleural catheter placement on 04/18/20 at [facility]; presented to the ED with complaints of right-sided pain and swelling. Imaging showed Dagon Budai tube sidehole located within the patient's chest wall. Request to IR for bedside removal of tunneled pleural catheter. EXAM: REMOVAL TUNNELED PLEURAL CATHETER MEDICATIONS: None. ANESTHESIA/SEDATION: None FLUOROSCOPY TIME:  None COMPLICATIONS: None immediate. PROCEDURE: Informed written consent was obtained from the patient after Onya Eutsler thorough discussion of the procedural risks,  benefits and alternatives. All questions were addressed. Maximal Sterile Barrier Technique was utilized including caps, mask, sterile gowns, sterile gloves, sterile drape, hand hygiene and skin antiseptic. Chandra Feger timeout was performed prior to the initiation of the procedure. The patient's right chest and catheter was prepped and draped in Legrande Hao normal sterile fashion. Using gentle manual traction the cuff of the catheter was exposed and the catheter was removed in it's entirety. Pressure was held until hemostasis was obtained. Sarkis Rhines sterile dressing was applied. The patient tolerated the procedure well with no immediate complications. IMPRESSION: Successful catheter removal as described above. Read by Candiss Norse, PA-C Electronically Signed   By: Markus Daft M.D.   On: 04/26/2020 10:30    Microbiology: Recent Results (from the past 240 hour(s))  SARS Coronavirus 2 by RT PCR (hospital order, performed in [facility] hospital lab) Nasopharyngeal Nasopharyngeal Swab     Status: None   Collection Time: 04/24/20  2:35 AM   Specimen: Nasopharyngeal Swab  Result Value Ref Range Status   SARS Coronavirus 2 NEGATIVE NEGATIVE Final    Comment: (NOTE) SARS-CoV-2 target nucleic acids are NOT DETECTED. The SARS-CoV-2 RNA is generally detectable in upper and lower respiratory specimens during the acute phase of infection. The lowest concentration of SARS-CoV-2 viral copies this assay can detect is 250 copies / mL. Rhea Kaelin negative result does not preclude SARS-CoV-2 infection and should not be used as the sole basis for treatment or other patient management decisions.  Guinevere Stephenson negative result may occur with improper specimen collection / handling, submission of specimen other than nasopharyngeal swab, presence of viral mutation(s) within the areas targeted by this assay, and inadequate number of viral copies (<250 copies / mL). Michelyn Scullin negative result must be combined with clinical observations, patient history, and epidemiological  information. Fact Sheet for Patients:   StrictlyIdeas.no Fact Sheet for Healthcare Providers: BankingDealers.co.za This test is not yet approved or cleared  by the [place] FDA and has been authorized for detection and/or diagnosis of SARS-CoV-2 by FDA under an Emergency Use Authorization (EUA).  This EUA will remain in effect (meaning this test can be used) for the duration of the COVID-19 declaration under Section 564(b)(1) of the Act, 21 U.S.C. section 360bbb-3(b)(1), unless the authorization is terminated or revoked sooner. Performed at [facility], [place] [address], [place], [place] [address]      Labs: Basic Metabolic Panel: Recent Labs  Lab 04/23/20 2150 04/24/20 0538 04/25/20 0249 04/26/20 0603  NA 134* 137 140 140  K 3.9 3.9 3.7 4.1  CL 98 102 102 103  CO2 29 25 29 29   GLUCOSE 101*  102* 97 95  BUN 10 9 5* <5*  CREATININE 0.91 0.89 0.75 0.98  CALCIUM 7.9* 8.2* 8.4* 8.7*  MG  --   --  2.3 2.2  PHOS  --   --  3.9 4.0   Liver Function Tests: Recent Labs  Lab 04/23/20 2150 04/25/20 0249 04/26/20 0603  AST 59* 37 32  ALT 88* 63* 49*  ALKPHOS 334* 301* 245*  BILITOT 0.7 0.6 0.5  PROT 5.6* 5.8* 5.5*  ALBUMIN 2.5* 2.6* 2.4*   No results for input(s): LIPASE, AMYLASE in the last 168 hours. No results for input(s): AMMONIA in the last 168 hours. CBC: Recent Labs  Lab 04/23/20 2150 04/24/20 0538 04/25/20 0249 04/26/20 0603  WBC 6.6 6.0 5.4 4.4  HGB 12.9 12.6 13.3 12.3  HCT 40.7 38.5 41.9 38.9  MCV 92.7 88.9 91.9 92.2  PLT 431* 400 404* 336   Cardiac Enzymes: No results for input(s): CKTOTAL, CKMB, CKMBINDEX, TROPONINI in the last 168 hours. BNP: BNP (last 3 results) No results for input(s): BNP in the last 8760 hours.  ProBNP (last 3 results) No results for input(s): PROBNP in the last 8760 hours.  CBG: No results for input(s): GLUCAP in the last 168  hours.     Signed:  Fayrene Helper MD.  [facility] 04/26/2020, 11:19 PM

## 2020-04-26 NOTE — Progress Notes (Signed)
Pt dc to home w ride from husband, walking w wheelchair hospital escort.   AVS printed and discussed, scripts sent to local pharmacy, PIVs removed and no further questions from patient or family at this time  Pt has follow up appts scheduled and will cont w outpt

## 2020-04-26 NOTE — Progress Notes (Signed)
Brief oncology note:  I notified the patient in person of her upcoming appoint with Dr. Julien Nordmann at the cancer center on 05/03/2020 at 2:15 PM.  This will be immediately after her appointment with radiation oncology.  Mikey Bussing, DNP, AGPCNP-BC, AOCNP Mon/Tues/Thurs/Fri 7am-5pm; Off Wednesdays Cell: [phone]

## 2020-04-27 ENCOUNTER — Other Ambulatory Visit: Payer: Self-pay | Admitting: Medical Oncology

## 2020-04-27 ENCOUNTER — Telehealth: Payer: Self-pay | Admitting: Medical Oncology

## 2020-04-27 DIAGNOSIS — C3491 Malignant neoplasm of unspecified part of right bronchus or lung: Secondary | ICD-10-CM

## 2020-04-27 LAB — HEPATITIS C VRS RNA DETECT BY PCR-QUAL: Hepatitis C Vrs RNA by PCR-Qual: NEGATIVE

## 2020-04-27 MED ORDER — OSIMERTINIB MESYLATE 80 MG PO TABS: 80.0000 mg | ORAL_TABLET | Freq: Every day | ORAL | 2 refills | Status: DC

## 2020-04-27 NOTE — Telephone Encounter (Signed)
Per pt insurance Tagrisso has to be filled at [facility]. rx sent.

## 2020-04-29 ENCOUNTER — Other Ambulatory Visit: Payer: Self-pay

## 2020-04-29 ENCOUNTER — Ambulatory Visit
Admit: 2020-04-29 | Discharge: 2020-04-29 | Disposition: A | Discharge status: Home / Self Care | Payer: 59 | Attending: Radiation Oncology | Admitting: Radiation Oncology

## 2020-04-29 DIAGNOSIS — C7931 Secondary malignant neoplasm of brain: Secondary | ICD-10-CM

## 2020-04-29 DIAGNOSIS — C7949 Secondary malignant neoplasm of other parts of nervous system: Secondary | ICD-10-CM

## 2020-04-29 MED ORDER — GADOBENATE DIMEGLUMINE 529 MG/ML IV SOLN
17.0000 mL | Freq: Once | INTRAVENOUS | Status: AC | PRN
  Administered 2020-04-29: 17 mL via INTRAVENOUS

## 2020-04-29 NOTE — Progress Notes (Signed)
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  Patient presented with symptoms of SOB while on vacation in [place].  She was worked up in [place] and was found to have a right effusion.  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 She came home and a family member recommended she go to [facility]. She did present but has decided that care is too far in [place].   3T MRI 04/29/2020: There is no evidence of intracranial metastatic disease. 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 No abnormal signal identified in the region of reported parasagittal right frontal lobe metastasis, which presumably was also an infarct.  CT CAP 04/24/2020: Malpositioned right-sided PleurX catheter. One of the side holes is located within the patient's chest wall. There is free fluid pockets of gas within the patient's right chest wall and right flank. At this time, there is no well-formed drainable fluid collection or abscess. While the fluid in pockets of gas may be secondary to the malpositioned PleurX catheter, a developing infectious process is within the differential diagnosis. Small right-sided pleural effusion.  No pneumothorax.  Findings consistent with metastatic lung cancer including a right infrahilar mass, multiple contralateral pulmonary nodules in the left lung in addition to sclerotic osseous lesions as detailed above. Correlation with the patient's prior outside imaging is recommended. There are ill-defined lesions in the liver and spleen which should be correlated with the patient's prior outside imaging.   PET([facility]): Concerns for disease in the chest, regional thoracic nodes, multiple bone metastases including ribs, L1 and L3, iliac bones and sternum and liver.  MRI Brain([facility]): 7 mm in greatest dimension in the frontal lobe, possibly a second lesion.  Thoracentesis Right  pleural effusion 04/21/2020    Past or anticipated interventions, if any, per medical oncology:  Dr. Julien Nordmann 05/03/2020 2:15 pm -Started on Tagrisso  Signs/Symptoms  Weight changes, if any: Lost about 10 pounds in a month in relation to decreased appetite.  Respiratory complaints, if any: SOB,  Hemoptysis, if any: Cough has gotten better, no blood noted.  Pain issues, if any: Chest tightness.  She had feeling that her bra's were getting tighter.  She states her pain radiates around to her back in the middle of the night.  She is taking pain medication, oxycodone.   Painful bone metastases at present, if any: She is having left hip, breast/chest, right jaw and rib pain.   SAFETY ISSUES:  Prior radiation? No  Pacemaker/ICD? No  Possible current pregnancy? postmenopausal  Is the patient on methotrexate? No  Additional Complaints / other details:

## 2020-05-03 ENCOUNTER — Encounter: Payer: Self-pay | Admitting: Internal Medicine

## 2020-05-03 ENCOUNTER — Ambulatory Visit
Admission: RE | Admit: 2020-05-03 | Discharge: 2020-05-03 | Disposition: A | Discharge status: Home / Self Care | Payer: 59 | Source: Ambulatory Visit | Attending: Radiation Oncology | Admitting: Radiation Oncology

## 2020-05-03 ENCOUNTER — Encounter: Payer: Self-pay | Admitting: Radiation Oncology

## 2020-05-03 ENCOUNTER — Other Ambulatory Visit: Payer: Self-pay

## 2020-05-03 ENCOUNTER — Encounter: Payer: Self-pay | Admitting: *Deleted

## 2020-05-03 ENCOUNTER — Inpatient Hospital Stay: Payer: 59 | Attending: Internal Medicine | Admitting: Internal Medicine

## 2020-05-03 ENCOUNTER — Telehealth: Payer: Self-pay | Admitting: Internal Medicine

## 2020-05-03 VITALS — BP 152/99 | HR 69 | Temp 97.5°F | Resp 20 | Ht 64.0 in | Wt 177.6 lb

## 2020-05-03 VITALS — BP 145/103 | HR 81 | Temp 97.2°F | Resp 18 | Ht 64.0 in | Wt 177.6 lb

## 2020-05-03 DIAGNOSIS — C7931 Secondary malignant neoplasm of brain: Secondary | ICD-10-CM | POA: Insufficient documentation

## 2020-05-03 DIAGNOSIS — C7951 Secondary malignant neoplasm of bone: Secondary | ICD-10-CM | POA: Insufficient documentation

## 2020-05-03 DIAGNOSIS — Z5111 Encounter for antineoplastic chemotherapy: Secondary | ICD-10-CM

## 2020-05-03 DIAGNOSIS — C3491 Malignant neoplasm of unspecified part of right bronchus or lung: Secondary | ICD-10-CM | POA: Insufficient documentation

## 2020-05-03 DIAGNOSIS — Z79899 Other long term (current) drug therapy: Secondary | ICD-10-CM | POA: Insufficient documentation

## 2020-05-03 DIAGNOSIS — C787 Secondary malignant neoplasm of liver and intrahepatic bile duct: Secondary | ICD-10-CM | POA: Insufficient documentation

## 2020-05-03 DIAGNOSIS — I2699 Other pulmonary embolism without acute cor pulmonale: Secondary | ICD-10-CM

## 2020-05-03 DIAGNOSIS — Z7951 Long term (current) use of inhaled steroids: Secondary | ICD-10-CM | POA: Insufficient documentation

## 2020-05-03 DIAGNOSIS — J90 Pleural effusion, not elsewhere classified: Secondary | ICD-10-CM | POA: Diagnosis not present

## 2020-05-03 DIAGNOSIS — Z7901 Long term (current) use of anticoagulants: Secondary | ICD-10-CM | POA: Insufficient documentation

## 2020-05-03 DIAGNOSIS — Z86711 Personal history of pulmonary embolism: Secondary | ICD-10-CM | POA: Insufficient documentation

## 2020-05-03 DIAGNOSIS — Z7189 Other specified counseling: Secondary | ICD-10-CM | POA: Insufficient documentation

## 2020-05-03 MED ORDER — HYDROCODONE-ACETAMINOPHEN 5-325 MG PO TABS: 1.0000 | ORAL_TABLET | Freq: Four times a day (QID) | ORAL | 0 refills | Status: DC | PRN

## 2020-05-03 NOTE — Progress Notes (Signed)
I called [facility] to obtain [employer] results.  I called thoracic navigator Lattie Haw but she is not in the office at this time.  I did leave vm message with my name and fax number to send information.

## 2020-05-03 NOTE — Progress Notes (Signed)
[place] [phone]   [phone]  CONSULT NOTE  REFERRING PHYSICIAN: Dr. Sarita Haver, MD   REASON FOR CONSULTATION:  59 years old white female recently diagnosed with lung cancer.  HPI Ennis Delpozo is a 59 y.o. female with no significant past medical history who was on a trip in [place] when she started experiencing shortness of breath as well as cough.  She was evaluated at the [facility] and during her work-up she was found to have right pleural effusion with near complete collapse of the right lung and leftward shift of the mediastinum..  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  The patient returned to [facility] and she was seen by Dr. Mindi Junker at [facility]  She presented with shortness of breath again and reaccumulation of the right pleural effusion.  She underwent thoracentesis and the final pathology was again consistent with adenocarcinoma of lung primary.  There was insufficient material for molecular studies.  The PD-L1 expression was 80%.  She had molecular studies done by [id number] that was consistent with EGFR, E746 exon 19 deletion mutation.  During her hospitalization she had CT angiogram of the chest on 04/15/2020 and the showed small subsegmental thromboembolism within the left upper lobe.  There was near complete collapse of the right lung with a large pleural effusion with leftward shift of the mediastinum.  There was multiple solid nodules throughout the left lung concerning for metastatic disease in this patient with a history of pulmonary adenocarcinoma.  There was heterogeneous and partially calcified structure along the right lower paratracheal station which is nonspecific.  MRI of the brain on 04/20/2020 showed peripherally enhancing 0.5 x 0.7 x 0.6 cm lesion in the left frontal lobe white matter adjacent to the  frontal horn of the left lateral ventricle with restricted diffusion.  Smaller enhancing lesion at the para sagittal aspect of the right frontal lobe.  These lesions are consistent with metastasis and no significant mass-effect.  The patient also had a PET scan on 04/20/2020 and it showed interval placement of a right-sided Pleurx catheter with persistent likely at least partially loculated moderate right pleural effusion and small to moderate right-sided pneumothorax which is new relative to the most recent chest radiograph from 04/15/2020.  There was redemonstrated primary hypermetabolic right infrahilar mass and additional multifocal hypermetabolic nodular opacities throughout the lungs bilaterally consistent for multifocal metastatic disease.  There was also multifocal nodal and osseous metastatic disease and multifocal consolidative opacities throughout the right lung may be related to combination of infection, aspiration and/or atelectasis.  Findings may also relate to lymphangitic spread of tumor.  The patient was started on treatment with Tagrisso 80 mg p.o. daily for the last 10 days, started on Apr 21, 2020 but this was interrupted for 3 days during her treatment for the Pleurx catheter insertion site infection.  He was recently admitted to [facility] with increasing pain around the chest tube site with significant bruising as well.  He was treated with vancomycin and Unasyn for possible infection. The patient lives close to [place] and she preferred to have her care locally.  She was referred to me today for evaluation and recommendation regarding her condition. When seen today she is feeling fine but continues to have shortness of breath with exertion.  The patient started feeling much better after starting treatment with Tagrisso.  She has occasional right-sided chest pain but no  significant cough or hemoptysis.  She denied having any nausea, vomiting, diarrhea or constipation.  She has no  skin rash.  She has no headache or visual changes. She was seen recently by radiation oncology and she had repeat MRI of the brain that showed no evidence of intracranial metastatic disease.  This is likely secondary to her treatment with Tagrisso which crosses the blood brain barrier and likely improve the disease in her brain. Family history mother died at age 47 with suspicious cancer.  Father still alive and has heart disease at age 67. The patient is married and has 5 children.  She used to work in [employer] houses with exposure to different chemicals.  She was accompanied today by her husband Ed and her daughter Vicente Males.  The patient has no history of smoking, alcohol or drug abuse.  HPI  No past medical history on file.  Past Surgical History:  Procedure Laterality Date  . IR REMOVAL OF PLURAL CATH W/CUFF  04/24/2020    No family history on file.  Social History Social History   Tobacco Use  . Smoking status: Never Smoker  . Smokeless tobacco: Never Used  Substance Use Topics  . Alcohol use: Not Currently  . Drug use: Never    Allergies  Allergen Reactions  . Other     Blood Products  . Cefdinir Rash    Current Outpatient Medications  Medication Sig Dispense Refill  . albuterol (PROVENTIL HFA;VENTOLIN HFA) 108 (90 BASE) MCG/ACT inhaler Inhale 1-2 puffs into the lungs every 6 (six) hours as needed for wheezing. (Patient not taking: Reported on 04/23/2020) 1 Inhaler 0  . beclomethasone (QVAR) 80 MCG/ACT inhaler INhale One puff into lungs daily (Patient not taking: Reported on 04/23/2020) 1 Inhaler 0  . benzonatate (TESSALON) 100 MG capsule Take 100 mg by mouth 3 (three) times daily as needed for cough.     Marland Kitchen ELIQUIS 5 MG TABS tablet Take 5 mg by mouth 2 (two) times daily.     . famotidine (PEPCID) 40 MG tablet Take 40 mg by mouth 2 (two) times daily.    . folic acid (FOLVITE) 1 MG tablet Take 1 mg by mouth daily.    . ondansetron (ZOFRAN) 4 MG tablet Take 4 mg by mouth every 8  (eight) hours as needed for nausea or vomiting.     Marland Kitchen osimertinib mesylate (TAGRISSO) 80 MG tablet Take 1 tablet (80 mg total) by mouth daily. 30 tablet 2   No current facility-administered medications for this visit.    Review of Systems  Constitutional: positive for fatigue Eyes: negative Ears, nose, mouth, throat, and face: negative Respiratory: positive for dyspnea on exertion and pleurisy/chest pain Cardiovascular: negative Gastrointestinal: negative Genitourinary:negative Integument/breast: negative Hematologic/lymphatic: negative Musculoskeletal:negative Neurological: negative Behavioral/Psych: negative Endocrine: negative Allergic/Immunologic: negative  Physical Exam  VVO:HYWVP, healthy, no distress, well nourished, well developed and anxious SKIN: skin color, texture, turgor are normal, no rashes or significant lesions HEAD: Normocephalic, No masses, lesions, tenderness or abnormalities EYES: normal, PERRLA, Conjunctiva are pink and non-injected EARS: External ears normal, Canals clear OROPHARYNX:no exudate, no erythema and lips, buccal mucosa, and tongue normal  NECK: supple, no adenopathy, no JVD LYMPH:  no palpable lymphadenopathy, no hepatosplenomegaly BREAST:not examined LUNGS: clear to auscultation , and palpation, clear to auscultation and percussion HEART: regular rate & rhythm, no murmurs and no gallops ABDOMEN:abdomen soft, non-tender, normal bowel sounds and no masses or organomegaly BACK: No CVA tenderness, Range of motion is normal EXTREMITIES:no joint deformities, effusion,  or inflammation, no edema  NEURO: alert & oriented x 3 with fluent speech, no focal motor/sensory deficits  PERFORMANCE STATUS: ECOG 1  LABORATORY DATA: Lab Results  Component Value Date   WBC 4.4 04/26/2020   HGB 12.3 04/26/2020   HCT 38.9 04/26/2020   MCV 92.2 04/26/2020   PLT 336 04/26/2020      Chemistry      Component Value Date/Time   NA 140 04/26/2020 0603   K  4.1 04/26/2020 0603   CL 103 04/26/2020 0603   CO2 29 04/26/2020 0603   BUN <5 (L) 04/26/2020 0603   CREATININE 0.98 04/26/2020 0603      Component Value Date/Time   CALCIUM 8.7 (L) 04/26/2020 0603   ALKPHOS 245 (H) 04/26/2020 0603   AST 32 04/26/2020 0603   ALT 49 (H) 04/26/2020 0603   BILITOT 0.5 04/26/2020 0603       RADIOGRAPHIC STUDIES: CT CHEST W CONTRAST  Result Date: 04/24/2020 CLINICAL DATA:  Right PleurX drain in place. Swelling about the PleurX catheter. Increasing shortness of breath. On chemotherapy. EXAM: CT CHEST, ABDOMEN, AND PELVIS WITH CONTRAST TECHNIQUE: Multidetector CT imaging of the chest, abdomen and pelvis was performed following the standard protocol during bolus administration of intravenous contrast. CONTRAST:  165m OMNIPAQUE IOHEXOL 300 MG/ML  SOLN COMPARISON:  February 18, 2009. FINDINGS: CT CHEST FINDINGS Cardiovascular: The heart size is normal. There is a trace pericardial effusion. There is no evidence for a thoracic aortic dissection or aneurysm. No evidence for large centrally located pulmonary embolism. Mediastinum/Nodes: --there is a centrally necrotic, peripherally calcified mass in the mediastinum measuring approximately 2.6 cm. Has significantly decreased in size since prior study in 2010. There is an enlarged subcarinal lymph node measuring approximately 1.7 cm. --No axillary lymphadenopathy. --No supraclavicular lymphadenopathy. --Normal thyroid gland. --The esophagus is unremarkable Lungs/Pleura: There is an ill-defined right infrahilar mass measuring approximately 5.5 x 4.5 cm (axial series 2, image 28). There are small areas of consolidation and ground-glass airspace opacities involving the bilateral upper lobes, right lower lobe, and right middle lobe. There are pulmonary nodules in the left lower lobe measuring up to approximately 7 mm (axial series 4, image 79). There is some interlobular septal thickening involving the right upper lobe. There is a  small right-sided pleural effusion. There is a right-sided PleurX catheter in place. One of the sideholes of the PleurX catheter is located in the patient's chest wall. There is fluid and gas within the patient's right chest wall without evidence for a definite well-formed drainable collection or abscess there is no right-sided pneumothorax. Musculoskeletal: There is a small sclerotic lesion in the T7 vertebral body. There is no acute displaced fracture. There is asymmetric right sided skin thickening involving the breast. This is felt to be reactive. Review of the MIP images confirms the above findings. CT ABDOMEN PELVIS FINDINGS Hepatobiliary: There is a small ill-defined hypodensity in hepatic segment 4A/B (axial series 2, image 48). Normal gallbladder.There is no biliary ductal dilation. Pancreas: Normal contours without ductal dilatation. No peripancreatic fluid collection. Spleen: There is a hypoattenuating lesion in the spleen measuring approximately 1.2 cm. This is indeterminate on this exam. Adrenals/Urinary Tract: --Adrenal glands: Unremarkable. --Right kidney/ureter: No hydronephrosis or radiopaque kidney stones. --Left kidney/ureter: No hydronephrosis or radiopaque kidney stones. --Urinary bladder: Unremarkable. Stomach/Bowel: --Stomach/Duodenum: No hiatal hernia or other gastric abnormality. Normal duodenal course and caliber. --Small bowel: Unremarkable. --Colon: Unremarkable. --Appendix: Normal. Vascular/Lymphatic: Normal course and caliber of the major abdominal vessels. --No retroperitoneal lymphadenopathy. --  No mesenteric lymphadenopathy. --No pelvic or inguinal lymphadenopathy. Reproductive: Unremarkable Other: No ascites or free air. Mild body wall edema. Musculoskeletal. There is an area of sclerosis involving the L1 vertebral body, concerning for a metastatic lesion. There are sclerotic lesions in the right iliac bone and right hemi sacrum concerning for metastatic disease. IMPRESSION: 1.  Malpositioned right-sided PleurX catheter. One of the sideholes is located within the patient's chest wall. There is free fluid pockets of gas within the patient's right chest wall and right flank. At this time, there is no well-formed drainable fluid collection or abscess. While the fluid in pockets of gas may be secondary to the malpositioned PleurX catheter, a developing infectious process is within the differential diagnosis. 2. Small right-sided pleural effusion.  No pneumothorax. 3. Findings consistent with metastatic lung cancer including a right infrahilar mass, multiple contralateral pulmonary nodules in the left lung in addition to sclerotic osseous lesions as detailed above. Correlation with the patient's prior outside imaging is recommended. There are ill-defined lesions in the liver and spleen which should be correlated with the patient's prior outside imaging. 4. Ground-glass airspace opacities with areas of consolidation are noted in the right upper lobe and right middle lobe. These may represent post treatment changes or developing pneumonia in the appropriate clinical setting. Electronically Signed   By: Constance Holster M.D.   On: 04/24/2020 01:48   MR Brain W Wo Contrast  Result Date: 05/01/2020 CLINICAL DATA:  Metastatic lung cancer EXAM: MRI HEAD WITHOUT AND WITH CONTRAST TECHNIQUE: Multiplanar, multiecho pulse sequences of the brain and surrounding structures were obtained without and with intravenous contrast. CONTRAST:  59m MULTIHANCE GADOBENATE DIMEGLUMINE 529 MG/ML IV SOLN COMPARISON:  Images are unavailable. Correlation is made with report of outside MRI brain 04/20/2020 in care everywhere FINDINGS: Brain: There is no mass or abnormal enhancement identified. Focus of mild diffusion hyperintensity reflecting T2 shine through is present in the white matter adjacent to the left lateral ventricle likely corresponding to reported peripherally enhancing lesion on the prior study. There is  no abnormal signal identified in the region of parasagittal right frontal lobe reported lesion. There is no acute infarction or intracranial hemorrhage. There is no hydrocephalus or extra-axial fluid collection. Vascular: Major vessel flow voids at the skull base are preserved. Skull and upper cervical spine: Normal marrow signal is preserved. Sinuses/Orbits: Paranasal sinuses are aerated. Orbits are unremarkable. Other: Sella is unremarkable.  Mastoid air cells are clear. IMPRESSION: There is no evidence of intracranial metastatic disease. 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 No abnormal signal identified in the region of reported parasagittal right frontal lobe metastasis, which presumably was also an infarct. Outside imaging could be uploaded for direct comparison if desired. Electronically Signed   By: PMacy MisM.D.   On: 05/01/2020 07:56   CT ABDOMEN PELVIS W CONTRAST  Result Date: 04/24/2020 CLINICAL DATA:  Right PleurX drain in place. Swelling about the PleurX catheter. Increasing shortness of breath. On chemotherapy. EXAM: CT CHEST, ABDOMEN, AND PELVIS WITH CONTRAST TECHNIQUE: Multidetector CT imaging of the chest, abdomen and pelvis was performed following the standard protocol during bolus administration of intravenous contrast. CONTRAST:  1038mOMNIPAQUE IOHEXOL 300 MG/ML  SOLN COMPARISON:  February 18, 2009. FINDINGS: CT CHEST FINDINGS Cardiovascular: The heart size is normal. There is a trace pericardial effusion. There is no evidence for a thoracic aortic dissection or aneurysm. No evidence for large centrally located pulmonary embolism. Mediastinum/Nodes: --there is a  centrally necrotic, peripherally calcified mass in the mediastinum measuring approximately 2.6 cm. Has significantly decreased in size since prior study in 2010. There is an enlarged subcarinal lymph node measuring approximately 1.7 cm. --No axillary  lymphadenopathy. --No supraclavicular lymphadenopathy. --Normal thyroid gland. --The esophagus is unremarkable Lungs/Pleura: There is an ill-defined right infrahilar mass measuring approximately 5.5 x 4.5 cm (axial series 2, image 28). There are small areas of consolidation and ground-glass airspace opacities involving the bilateral upper lobes, right lower lobe, and right middle lobe. There are pulmonary nodules in the left lower lobe measuring up to approximately 7 mm (axial series 4, image 79). There is some interlobular septal thickening involving the right upper lobe. There is a small right-sided pleural effusion. There is a right-sided PleurX catheter in place. One of the sideholes of the PleurX catheter is located in the patient's chest wall. There is fluid and gas within the patient's right chest wall without evidence for a definite well-formed drainable collection or abscess there is no right-sided pneumothorax. Musculoskeletal: There is a small sclerotic lesion in the T7 vertebral body. There is no acute displaced fracture. There is asymmetric right sided skin thickening involving the breast. This is felt to be reactive. Review of the MIP images confirms the above findings. CT ABDOMEN PELVIS FINDINGS Hepatobiliary: There is a small ill-defined hypodensity in hepatic segment 4A/B (axial series 2, image 48). Normal gallbladder.There is no biliary ductal dilation. Pancreas: Normal contours without ductal dilatation. No peripancreatic fluid collection. Spleen: There is a hypoattenuating lesion in the spleen measuring approximately 1.2 cm. This is indeterminate on this exam. Adrenals/Urinary Tract: --Adrenal glands: Unremarkable. --Right kidney/ureter: No hydronephrosis or radiopaque kidney stones. --Left kidney/ureter: No hydronephrosis or radiopaque kidney stones. --Urinary bladder: Unremarkable. Stomach/Bowel: --Stomach/Duodenum: No hiatal hernia or other gastric abnormality. Normal duodenal course and  caliber. --Small bowel: Unremarkable. --Colon: Unremarkable. --Appendix: Normal. Vascular/Lymphatic: Normal course and caliber of the major abdominal vessels. --No retroperitoneal lymphadenopathy. --No mesenteric lymphadenopathy. --No pelvic or inguinal lymphadenopathy. Reproductive: Unremarkable Other: No ascites or free air. Mild body wall edema. Musculoskeletal. There is an area of sclerosis involving the L1 vertebral body, concerning for a metastatic lesion. There are sclerotic lesions in the right iliac bone and right hemi sacrum concerning for metastatic disease. IMPRESSION: 1. Malpositioned right-sided PleurX catheter. One of the sideholes is located within the patient's chest wall. There is free fluid pockets of gas within the patient's right chest wall and right flank. At this time, there is no well-formed drainable fluid collection or abscess. While the fluid in pockets of gas may be secondary to the malpositioned PleurX catheter, a developing infectious process is within the differential diagnosis. 2. Small right-sided pleural effusion.  No pneumothorax. 3. Findings consistent with metastatic lung cancer including a right infrahilar mass, multiple contralateral pulmonary nodules in the left lung in addition to sclerotic osseous lesions as detailed above. Correlation with the patient's prior outside imaging is recommended. There are ill-defined lesions in the liver and spleen which should be correlated with the patient's prior outside imaging. 4. Ground-glass airspace opacities with areas of consolidation are noted in the right upper lobe and right middle lobe. These may represent post treatment changes or developing pneumonia in the appropriate clinical setting. Electronically Signed   By: Constance Holster M.D.   On: 04/24/2020 01:48   DG CHEST PORT 1 VIEW  Result Date: 04/25/2020 CLINICAL DATA:  Right pleural effusion EXAM: PORTABLE CHEST 1 VIEW COMPARISON:  04/24/2020 FINDINGS: Cardiomegaly. Small  right pleural effusion.  Right lung airspace disease again noted, unchanged. Left lung clear. No acute bony abnormality. IMPRESSION: Stable small right pleural effusion with right lung airspace disease. Electronically Signed   By: Rolm Baptise M.D.   On: 04/25/2020 09:15   DG Chest Port 1 View  Result Date: 04/24/2020 CLINICAL DATA:  Right pleural effusion, removed partially dislodged drain catheter EXAM: PORTABLE CHEST 1 VIEW COMPARISON:  04/23/2020 FINDINGS: The heart size and mediastinal contours are within normal limits. Interval removal of a previously seen right-sided tunneled pleural catheter. Unchanged small right pleural effusion associated atelectasis or consolidation. Left lung is normally aerated. The visualized skeletal structures are unremarkable. IMPRESSION: Interval removal of a previously seen right-sided tunneled pleural catheter. Unchanged small right pleural effusion and associated atelectasis or consolidation. Electronically Signed   By: Eddie Candle M.D.   On: 04/24/2020 13:54   DG Chest Port 1 View  Result Date: 04/23/2020 CLINICAL DATA:  Status post right-sided chest drain placed with subsequent right-sided chest swelling. EXAM: PORTABLE CHEST 1 VIEW COMPARISON:  August 24, 2013 FINDINGS: A right-sided chest tube is seen with its distal tip overlying the medial aspect of the right lung base. There is mild right basilar atelectasis with a small right pleural effusion. Mild right apical pleural fluid is also seen. Mild atelectasis is seen within the mid right lung. Mild right-sided volume loss is seen. No pneumothorax is identified. The heart size and mediastinal contours are within normal limits. The visualized skeletal structures are unremarkable. IMPRESSION: 1. Right-sided chest tube placement. 2. Mild right basilar atelectasis with a small right pleural effusion. 3. Mild atelectasis within the mid right lung. Electronically Signed   By: Virgina Norfolk M.D.   On: 04/23/2020  21:13   IR Removal Of Plural Cath W/Cuff  Result Date: 04/26/2020 INDICATION: Patient with history of metastatic adenocarcinoma the with recurrent right-sided pleural effusion; status post tunneled pleural catheter placement on 04/18/20 at [facility]; presented to the ED with complaints of right-sided pain and swelling. Imaging showed a tube sidehole located within the patient's chest wall. Request to IR for bedside removal of tunneled pleural catheter. EXAM: REMOVAL TUNNELED PLEURAL CATHETER MEDICATIONS: None. ANESTHESIA/SEDATION: None FLUOROSCOPY TIME:  None COMPLICATIONS: None immediate. PROCEDURE: Informed written consent was obtained from the patient after a thorough discussion of the procedural risks, benefits and alternatives. All questions were addressed. Maximal Sterile Barrier Technique was utilized including caps, mask, sterile gowns, sterile gloves, sterile drape, hand hygiene and skin antiseptic. A timeout was performed prior to the initiation of the procedure. The patient's right chest and catheter was prepped and draped in a normal sterile fashion. Using gentle manual traction the cuff of the catheter was exposed and the catheter was removed in it's entirety. Pressure was held until hemostasis was obtained. A sterile dressing was applied. The patient tolerated the procedure well with no immediate complications. IMPRESSION: Successful catheter removal as described above. Read by Candiss Norse, PA-C Electronically Signed   By: Markus Daft M.D.   On: 04/26/2020 10:30    ASSESSMENT: This is a very pleasant 59 years old white female recently diagnosed with stage IV (T3, N2, M1c) non-small cell lung cancer, adenocarcinoma with positive EGFR mutation with deletion in exon 19 and presented with large infrahilar mass in addition to mediastinal lymphadenopathy as well as metastatic disease to the liver and bone with suspicious brain metastasis that significantly improved after the patient  started treatment with Tagrisso and was not evident on the repeat MRI of the brain  few days ago.  This was diagnosed in May 2021.   PLAN: I had a lengthy discussion with the patient and her family today about her current disease stage, prognosis and treatment options.  I personally and independently reviewed the scan images and discussed the results with the patient and her family. I had a lengthy discussion with them about the role of the EGFR mutation as well as the patient his prognosis with treatment with EGFR tyrosine kinase inhibitor. I strongly recommend for the patient to continue her current treatment with Tagrisso with the same dose. We also remove the stitches from the right side of the chest today. For pain management I will give her a refill of her pain medication. For the history of pulmonary embolism, she will continue her current treatment with Eliquis. I will continue to monitor the patient closely on her current treatment and we will have repeat blood work in 2 weeks. Her insurance also denied her previous imaging studies at [facility] and we will help the patient and her husband with their appeal for the approval. The patient and her family have several questions and I answered them completely to their satisfaction. The patient was advised to call immediately if she has any concerning symptoms in the interval.  The patient voices understanding of current disease status and treatment options and is in agreement with the current care plan.  All questions were answered. The patient knows to call the clinic with any problems, questions or concerns. We can certainly see the patient much sooner if necessary.  Thank you so much for allowing me to participate in the care of [facility]. I will continue to follow up the patient with you and assist in her care.  The total time spent in the appointment was 100 minutes.  Disclaimer: This note was dictated with voice  recognition software. Similar sounding words can inadvertently be transcribed and may not be corrected upon review.   Eilleen Kempf May 03, 2020, 2:37 PM

## 2020-05-03 NOTE — Progress Notes (Signed)
Radiation Oncology         [phone] ________________________________  Name: Brittany Mccarty        MRN: [id number]  Date of Service: 05/03/2020 DOB: 09-Dec-1960  VE:[facility], No Pcp Per  Brittany Mccarty Acey Lav, MD     REFERRING PHYSICIAN: Ventura Sellers, MD   DIAGNOSIS: The encounter diagnosis was Adenocarcinoma of lung, right ([place]).   HISTORY OF PRESENT ILLNESS: Brittany Mccarty is a 59 y.o. female seen at the request of Dr. 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 She developed shortness of breath while on vacation in [place], and she presented to [place] and was worked up and found to have a right effusion. 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 She came home and a family member recommended she go to [facility]. She did present but has decided that care is too far in [place]. She did have a PET scan and brain MRI, both of which had not been reviewed by the ordering team at [facility] but a prescription was called in for [place] and she started taking this. For this reason as well she's decided to relocate care closer to home. On her PET, there are concerns for disease in the chest, regional thoracic nodes, multiple bone metastases including ribs, L1 and L3 lumbar spine, iliac bones, and sternum. She has liver disease, and MRI brain revealed at least one metastastatic lesion, 42m in greatest dimension in the frontal lobe, possibly a second.  I contacted her last week prior to her discharge to introduce our department to her.  We discussed the possibility of using stereotactic radiosurgery to treat her brain metastasis, and recommended that she undergo a 3T MRI of the brain.  She did have this performed on 04/29/2020 which interestingly result reveals resolution of the concern that was previously seen, and in retrospect the area was felt to be most consistent with subacute infarct.  She is seen today to discuss  options of palliative radiotherapy to the bone.   PREVIOUS RADIATION THERAPY: No   PAST MEDICAL HISTORY: History reviewed. No pertinent past medical history.     PAST SURGICAL HISTORY: Past Surgical History:  Procedure Laterality Date  . IR REMOVAL OF PLURAL CATH W/CUFF  04/24/2020     FAMILY HISTORY: History reviewed. No pertinent family history.   SOCIAL HISTORY:  reports that she has never smoked. She has never used smokeless tobacco. She reports previous alcohol use. She reports that she does not use drugs.  The patient is married and [place]  She is accompanied by her husband and daughter.   ALLERGIES: Other and Cefdinir   MEDICATIONS:  Current Outpatient Medications  Medication Sig Dispense Refill  . ELIQUIS 5 MG TABS tablet Take 5 mg by mouth 2 (two) times daily.     . famotidine (PEPCID) 40 MG tablet Take 40 mg by mouth 2 (two) times daily.    .Marland Kitchenosimertinib mesylate (TAGRISSO) 80 MG tablet Take 1 tablet (80 mg total) by mouth daily. 30 tablet 2  . benzonatate (TESSALON) 100 MG capsule Take 100 mg by mouth 3 (three) times daily as needed for cough.     . folic acid (FOLVITE) 1 MG tablet Take 1 mg by mouth daily.    . ondansetron (ZOFRAN) 4 MG tablet Take 4 mg by mouth every 8 (eight) hours as needed for nausea or vomiting.      No current facility-administered medications for this encounter.  REVIEW OF SYSTEMS: On review of systems the patient reports that she is starting to feel little bit more short of breath and is concerned that she may have more pleural fluid accumulating in the right lung base.  She states that she is short of breath with exertion, and tires easily.  She denies any severe pain, but intermittently will notice left hip and sternal discomfort.  Please correlate to focal findings from her PET scan.  No other complaints are verbalized.     PHYSICAL EXAM:  Wt Readings from Last 3 Encounters:  05/03/20 177 lb 9.6 oz (80.6 kg)    05/03/20 177 lb 9.6 oz (80.6 kg)  04/24/20 184 lb 8.4 oz (83.7 kg)   Temp Readings from Last 3 Encounters:  05/03/20 (!) 97.5 F (36.4 C) (Temporal)  05/03/20 (!) 97.2 F (36.2 C)  04/26/20 (!) 97.3 F (36.3 C) (Oral)   BP Readings from Last 3 Encounters:  05/03/20 (!) 152/99  05/03/20 (!) 145/103  04/26/20 (!) 144/88   Pulse Readings from Last 3 Encounters:  05/03/20 69  05/03/20 81  04/26/20 85   Pain Assessment Pain Score: 5  Pain Loc: Chest/10  In general this is a well appearing Caucasian female in no acute distress.  She's alert and oriented x4 and appropriate throughout the examination. Cardiopulmonary assessment is negative for acute distress and she exhibits normal effort.    ECOG = 1  0 - Asymptomatic (Fully active, able to carry on all predisease activities without restriction)  1 - Symptomatic but completely ambulatory (Restricted in physically strenuous activity but ambulatory and able to carry out work of a light or sedentary nature. For example, light housework, office work)  2 - Symptomatic, <50% in bed during the day (Ambulatory and capable of all self care but unable to carry out any work activities. Up and about more than 50% of waking hours)  3 - Symptomatic, >50% in bed, but not bedbound (Capable of only limited self-care, confined to bed or chair 50% or more of waking hours)  4 - Bedbound (Completely disabled. Cannot carry on any self-care. Totally confined to bed or chair)  5 - Death   Eustace Pen MM, Creech RH, Tormey DC, et al. [phone]). "Toxicity and response criteria of the [facility]". [place] Oncol. 5 (6): 649-55    LABORATORY DATA:  Lab Results  Component Value Date   WBC 4.4 04/26/2020   HGB 12.3 04/26/2020   HCT 38.9 04/26/2020   MCV 92.2 04/26/2020   PLT 336 04/26/2020   Lab Results  Component Value Date   NA 140 04/26/2020   K 4.1 04/26/2020   CL 103 04/26/2020   CO2 29 04/26/2020   Lab Results   Component Value Date   ALT 49 (H) 04/26/2020   AST 32 04/26/2020   ALKPHOS 245 (H) 04/26/2020   BILITOT 0.5 04/26/2020      RADIOGRAPHY: CT CHEST W CONTRAST  Result Date: 04/24/2020 CLINICAL DATA:  Right PleurX drain in place. Swelling about the PleurX catheter. Increasing shortness of breath. On chemotherapy. EXAM: CT CHEST, ABDOMEN, AND PELVIS WITH CONTRAST TECHNIQUE: Multidetector CT imaging of the chest, abdomen and pelvis was performed following the standard protocol during bolus administration of intravenous contrast. CONTRAST:  19m OMNIPAQUE IOHEXOL 300 MG/ML  SOLN COMPARISON:  February 18, 2009. FINDINGS: CT CHEST FINDINGS Cardiovascular: The heart size is normal. There is a trace pericardial effusion. There is no evidence for a thoracic aortic dissection or aneurysm.  No evidence for large centrally located pulmonary embolism. Mediastinum/Nodes: --there is a centrally necrotic, peripherally calcified mass in the mediastinum measuring approximately 2.6 cm. Has significantly decreased in size since prior study in 2010. There is an enlarged subcarinal lymph node measuring approximately 1.7 cm. --No axillary lymphadenopathy. --No supraclavicular lymphadenopathy. --Normal thyroid gland. --The esophagus is unremarkable Lungs/Pleura: There is an ill-defined right infrahilar mass measuring approximately 5.5 x 4.5 cm (axial series 2, image 28). There are small areas of consolidation and ground-glass airspace opacities involving the bilateral upper lobes, right lower lobe, and right middle lobe. There are pulmonary nodules in the left lower lobe measuring up to approximately 7 mm (axial series 4, image 79). There is some interlobular septal thickening involving the right upper lobe. There is a small right-sided pleural effusion. There is a right-sided PleurX catheter in place. One of the sideholes of the PleurX catheter is located in the patient's chest wall. There is fluid and gas within the patient's  right chest wall without evidence for a definite well-formed drainable collection or abscess there is no right-sided pneumothorax. Musculoskeletal: There is a small sclerotic lesion in the T7 vertebral body. There is no acute displaced fracture. There is asymmetric right sided skin thickening involving the breast. This is felt to be reactive. Review of the MIP images confirms the above findings. CT ABDOMEN PELVIS FINDINGS Hepatobiliary: There is a small ill-defined hypodensity in hepatic segment 4A/B (axial series 2, image 48). Normal gallbladder.There is no biliary ductal dilation. Pancreas: Normal contours without ductal dilatation. No peripancreatic fluid collection. Spleen: There is a hypoattenuating lesion in the spleen measuring approximately 1.2 cm. This is indeterminate on this exam. Adrenals/Urinary Tract: --Adrenal glands: Unremarkable. --Right kidney/ureter: No hydronephrosis or radiopaque kidney stones. --Left kidney/ureter: No hydronephrosis or radiopaque kidney stones. --Urinary bladder: Unremarkable. Stomach/Bowel: --Stomach/Duodenum: No hiatal hernia or other gastric abnormality. Normal duodenal course and caliber. --Small bowel: Unremarkable. --Colon: Unremarkable. --Appendix: Normal. Vascular/Lymphatic: Normal course and caliber of the major abdominal vessels. --No retroperitoneal lymphadenopathy. --No mesenteric lymphadenopathy. --No pelvic or inguinal lymphadenopathy. Reproductive: Unremarkable Other: No ascites or free air. Mild body wall edema. Musculoskeletal. There is an area of sclerosis involving the L1 vertebral body, concerning for a metastatic lesion. There are sclerotic lesions in the right iliac bone and right hemi sacrum concerning for metastatic disease. IMPRESSION: 1. Malpositioned right-sided PleurX catheter. One of the sideholes is located within the patient's chest wall. There is free fluid pockets of gas within the patient's right chest wall and right flank. At this time, there  is no well-formed drainable fluid collection or abscess. While the fluid in pockets of gas may be secondary to the malpositioned PleurX catheter, a developing infectious process is within the differential diagnosis. 2. Small right-sided pleural effusion.  No pneumothorax. 3. Findings consistent with metastatic lung cancer including a right infrahilar mass, multiple contralateral pulmonary nodules in the left lung in addition to sclerotic osseous lesions as detailed above. Correlation with the patient's prior outside imaging is recommended. There are ill-defined lesions in the liver and spleen which should be correlated with the patient's prior outside imaging. 4. Ground-glass airspace opacities with areas of consolidation are noted in the right upper lobe and right middle lobe. These may represent post treatment changes or developing pneumonia in the appropriate clinical setting. Electronically Signed   By: Constance Holster M.D.   On: 04/24/2020 01:48   MR Brain W Wo Contrast  Result Date: 05/01/2020 CLINICAL DATA:  Metastatic lung cancer EXAM: MRI HEAD  WITHOUT AND WITH CONTRAST TECHNIQUE: Multiplanar, multiecho pulse sequences of the brain and surrounding structures were obtained without and with intravenous contrast. CONTRAST:  50m MULTIHANCE GADOBENATE DIMEGLUMINE 529 MG/ML IV SOLN COMPARISON:  Images are unavailable. Correlation is made with report of outside MRI brain 04/20/2020 in care everywhere FINDINGS: Brain: There is no mass or abnormal enhancement identified. Focus of mild diffusion hyperintensity reflecting T2 shine through is present in the white matter adjacent to the left lateral ventricle likely corresponding to reported peripherally enhancing lesion on the prior study. There is no abnormal signal identified in the region of parasagittal right frontal lobe reported lesion. There is no acute infarction or intracranial hemorrhage. There is no hydrocephalus or extra-axial fluid collection.  Vascular: Major vessel flow voids at the skull base are preserved. Skull and upper cervical spine: Normal marrow signal is preserved. Sinuses/Orbits: Paranasal sinuses are aerated. Orbits are unremarkable. Other: Sella is unremarkable.  Mastoid air cells are clear. IMPRESSION: There is no evidence of intracranial metastatic disease. 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 No abnormal signal identified in the region of reported parasagittal right frontal lobe metastasis, which presumably was also an infarct. Outside imaging could be uploaded for direct comparison if desired. Electronically Signed   By: PMacy MisM.D.   On: 05/01/2020 07:56   CT ABDOMEN PELVIS W CONTRAST  Result Date: 04/24/2020 CLINICAL DATA:  Right PleurX drain in place. Swelling about the PleurX catheter. Increasing shortness of breath. On chemotherapy. EXAM: CT CHEST, ABDOMEN, AND PELVIS WITH CONTRAST TECHNIQUE: Multidetector CT imaging of the chest, abdomen and pelvis was performed following the standard protocol during bolus administration of intravenous contrast. CONTRAST:  1061mOMNIPAQUE IOHEXOL 300 MG/ML  SOLN COMPARISON:  February 18, 2009. FINDINGS: CT CHEST FINDINGS Cardiovascular: The heart size is normal. There is a trace pericardial effusion. There is no evidence for a thoracic aortic dissection or aneurysm. No evidence for large centrally located pulmonary embolism. Mediastinum/Nodes: --there is a centrally necrotic, peripherally calcified mass in the mediastinum measuring approximately 2.6 cm. Has significantly decreased in size since prior study in 2010. There is an enlarged subcarinal lymph node measuring approximately 1.7 cm. --No axillary lymphadenopathy. --No supraclavicular lymphadenopathy. --Normal thyroid gland. --The esophagus is unremarkable Lungs/Pleura: There is an ill-defined right infrahilar mass measuring approximately 5.5 x 4.5 cm (axial series  2, image 28). There are small areas of consolidation and ground-glass airspace opacities involving the bilateral upper lobes, right lower lobe, and right middle lobe. There are pulmonary nodules in the left lower lobe measuring up to approximately 7 mm (axial series 4, image 79). There is some interlobular septal thickening involving the right upper lobe. There is a small right-sided pleural effusion. There is a right-sided PleurX catheter in place. One of the sideholes of the PleurX catheter is located in the patient's chest wall. There is fluid and gas within the patient's right chest wall without evidence for a definite well-formed drainable collection or abscess there is no right-sided pneumothorax. Musculoskeletal: There is a small sclerotic lesion in the T7 vertebral body. There is no acute displaced fracture. There is asymmetric right sided skin thickening involving the breast. This is felt to be reactive. Review of the MIP images confirms the above findings. CT ABDOMEN PELVIS FINDINGS Hepatobiliary: There is a small ill-defined hypodensity in hepatic segment 4A/B (axial series 2, image 48). Normal gallbladder.There is no biliary ductal dilation. Pancreas: Normal contours without ductal dilatation. No peripancreatic fluid collection. Spleen:  There is a hypoattenuating lesion in the spleen measuring approximately 1.2 cm. This is indeterminate on this exam. Adrenals/Urinary Tract: --Adrenal glands: Unremarkable. --Right kidney/ureter: No hydronephrosis or radiopaque kidney stones. --Left kidney/ureter: No hydronephrosis or radiopaque kidney stones. --Urinary bladder: Unremarkable. Stomach/Bowel: --Stomach/Duodenum: No hiatal hernia or other gastric abnormality. Normal duodenal course and caliber. --Small bowel: Unremarkable. --Colon: Unremarkable. --Appendix: Normal. Vascular/Lymphatic: Normal course and caliber of the major abdominal vessels. --No retroperitoneal lymphadenopathy. --No mesenteric  lymphadenopathy. --No pelvic or inguinal lymphadenopathy. Reproductive: Unremarkable Other: No ascites or free air. Mild body wall edema. Musculoskeletal. There is an area of sclerosis involving the L1 vertebral body, concerning for a metastatic lesion. There are sclerotic lesions in the right iliac bone and right hemi sacrum concerning for metastatic disease. IMPRESSION: 1. Malpositioned right-sided PleurX catheter. One of the sideholes is located within the patient's chest wall. There is free fluid pockets of gas within the patient's right chest wall and right flank. At this time, there is no well-formed drainable fluid collection or abscess. While the fluid in pockets of gas may be secondary to the malpositioned PleurX catheter, a developing infectious process is within the differential diagnosis. 2. Small right-sided pleural effusion.  No pneumothorax. 3. Findings consistent with metastatic lung cancer including a right infrahilar mass, multiple contralateral pulmonary nodules in the left lung in addition to sclerotic osseous lesions as detailed above. Correlation with the patient's prior outside imaging is recommended. There are ill-defined lesions in the liver and spleen which should be correlated with the patient's prior outside imaging. 4. Ground-glass airspace opacities with areas of consolidation are noted in the right upper lobe and right middle lobe. These may represent post treatment changes or developing pneumonia in the appropriate clinical setting. Electronically Signed   By: Constance Holster M.D.   On: 04/24/2020 01:48   DG CHEST PORT 1 VIEW  Result Date: 04/25/2020 CLINICAL DATA:  Right pleural effusion EXAM: PORTABLE CHEST 1 VIEW COMPARISON:  04/24/2020 FINDINGS: Cardiomegaly. Small right pleural effusion. Right lung airspace disease again noted, unchanged. Left lung clear. No acute bony abnormality. IMPRESSION: Stable small right pleural effusion with right lung airspace disease.  Electronically Signed   By: Rolm Baptise M.D.   On: 04/25/2020 09:15   DG Chest Port 1 View  Result Date: 04/24/2020 CLINICAL DATA:  Right pleural effusion, removed partially dislodged drain catheter EXAM: PORTABLE CHEST 1 VIEW COMPARISON:  04/23/2020 FINDINGS: The heart size and mediastinal contours are within normal limits. Interval removal of a previously seen right-sided tunneled pleural catheter. Unchanged small right pleural effusion associated atelectasis or consolidation. Left lung is normally aerated. The visualized skeletal structures are unremarkable. IMPRESSION: Interval removal of a previously seen right-sided tunneled pleural catheter. Unchanged small right pleural effusion and associated atelectasis or consolidation. Electronically Signed   By: Eddie Candle M.D.   On: 04/24/2020 13:54   DG Chest Port 1 View  Result Date: 04/23/2020 CLINICAL DATA:  Status post right-sided chest drain placed with subsequent right-sided chest swelling. EXAM: PORTABLE CHEST 1 VIEW COMPARISON:  August 24, 2013 FINDINGS: A right-sided chest tube is seen with its distal tip overlying the medial aspect of the right lung base. There is mild right basilar atelectasis with a small right pleural effusion. Mild right apical pleural fluid is also seen. Mild atelectasis is seen within the mid right lung. Mild right-sided volume loss is seen. No pneumothorax is identified. The heart size and mediastinal contours are within normal limits. The visualized skeletal structures are unremarkable. IMPRESSION:  1. Right-sided chest tube placement. 2. Mild right basilar atelectasis with a small right pleural effusion. 3. Mild atelectasis within the mid right lung. Electronically Signed   By: Virgina Norfolk M.D.   On: 04/23/2020 21:13   IR Removal Of Plural Cath W/Cuff  Result Date: 04/26/2020 INDICATION: Patient with history of metastatic adenocarcinoma the with recurrent right-sided pleural effusion; status post tunneled  pleural catheter placement on 04/18/20 at [facility]; presented to the ED with complaints of right-sided pain and swelling. Imaging showed a tube sidehole located within the patient's chest wall. Request to IR for bedside removal of tunneled pleural catheter. EXAM: REMOVAL TUNNELED PLEURAL CATHETER MEDICATIONS: None. ANESTHESIA/SEDATION: None FLUOROSCOPY TIME:  None COMPLICATIONS: None immediate. PROCEDURE: Informed written consent was obtained from the patient after a thorough discussion of the procedural risks, benefits and alternatives. All questions were addressed. Maximal Sterile Barrier Technique was utilized including caps, mask, sterile gowns, sterile gloves, sterile drape, hand hygiene and skin antiseptic. A timeout was performed prior to the initiation of the procedure. The patient's right chest and catheter was prepped and draped in a normal sterile fashion. Using gentle manual traction the cuff of the catheter was exposed and the catheter was removed in it's entirety. Pressure was held until hemostasis was obtained. A sterile dressing was applied. The patient tolerated the procedure well with no immediate complications. IMPRESSION: Successful catheter removal as described above. Read by Candiss Norse, PA-C Electronically Signed   By: Markus Daft M.D.   On: 04/26/2020 10:30       IMPRESSION/PLAN: 1. Stage IV, EGFR mutated, NSCLC< adenocarcinoma of the right lung with multifocal bone disease as well as single metastatic deposit in the liver.Dr. Lisbeth Renshaw discusses the pathology findings and reviews the nature of metastatic lung cancer.  He discusses the rationale to consider palliative radiotherapy both to the iliac wing, lumbar spine, and sternum.  The patient is counseled on the findings and we personally reviewed her PET scan with her.  We discussed the risks, benefits, short, and long term effects of radiotherapy. Dr. Lisbeth Renshaw discusses the delivery and logistics of radiotherapy and  anticipates a course of 2 weeks of radiotherapy.  At this time, the patient feels as though her symptoms are well enough controlled and would like to hold off on any radiotherapy at this time to allow time to see how her [place] Nip works.  We discussed that if she did start to have any symptoms progressively of pain in the sternum, low back, or left hip region we would be more than happy to start a palliative course of radiation therapy.  She will keep [place] informed and have also reached out to medical oncology so that they know our plan.  We anticipate that she will continue on Tagrisso and be followed for several months prior to reimaging. 2. Findings on brain MRI scan.  We will reach out to Dr. Mickeal Mccarty as well and discuss her case in our brain oncology conference on Monday.  Given that she had these findings concerning for possible subacute infarct, and in discussing with her family it sounds like she had a similar episode several years ago of actually presenting with strokelike symptoms, we feel that it would be a good idea for Dr. Mickeal Mccarty to weigh in.  Dr. Lisbeth Renshaw does recommend repeat MRI scan with 3T protocol in 3 months to confirm that there is no disease at that time.  The patient is in agreement with this plan.  In a visit lasting 59  minutes, greater than 50% of the time was spent face to face discussing the patient's condition, in preparation for the discussion, and coordinating the patient's care.  The above documentation reflects my direct findings during this shared patient visit. Please see the separate note by Dr. Lisbeth Renshaw on this date for the remainder of the patient's plan of care.    Carola Rhine, PAC

## 2020-05-03 NOTE — Telephone Encounter (Signed)
Scheduled per 06/01 los, patient received after visit summary and calender.

## 2020-05-04 ENCOUNTER — Ambulatory Visit: Payer: 59

## 2020-05-04 ENCOUNTER — Ambulatory Visit: Payer: 59 | Admitting: Radiation Oncology

## 2020-05-04 ENCOUNTER — Encounter: Payer: Self-pay | Admitting: *Deleted

## 2020-05-04 NOTE — Progress Notes (Signed)
Oncology Nurse Navigator Documentation  Oncology Nurse Navigator Flowsheets 05/04/2020  Diagnosis Status Confirmed Diagnosis Complete  Planned Course of Treatment Targeted Therapy  Phase of Treatment Targeted Therapy  Targeted Therapy Actual Start Date: 04/28/2020  Navigator Location [facility]  Navigator Encounter Type Initial MedOnc;Other:  Treatment Initiated Date 04/28/2020  Patient Visit Type MedOnc/I spoke with Ms. Stream and her family yesterday.  Patient has stage IV lung cancer and was started on Oral biologic 04/28/20.  Dr. Julien Nordmann would like [employer] results. I contacted Nurse navigator at [facility] and she was able to fax to [place].  Dr. Julien Nordmann is update on results.  Treatment Phase Treatment  Barriers/Navigation Needs Coordination of Care  Interventions Coordination of Care;Psycho-Social Support  Acuity Level 2-Minimal Needs (1-2 Barriers Identified)  Coordination of Care Other  Time Spent with Patient 45

## 2020-05-05 ENCOUNTER — Telehealth: Payer: Self-pay | Admitting: Medical Oncology

## 2020-05-05 NOTE — Telephone Encounter (Signed)
Called about drug assistance.

## 2020-05-05 NOTE — Telephone Encounter (Signed)
Oxycodone vs hydrocodone.  Hydrocodone refilled yesterday . Pt confused if she should take it.  She said hospitalist-Dr Erling Cruz, said not to take tylenol and on 5/25 he prescribed oxycodone 1-2 tabs q 4 h prn # 60. She took her last one today.

## 2020-05-06 NOTE — Telephone Encounter (Signed)
The hydrocodone as a replacement for her oxycodone.  This is the preferred brand by her insurance.  Thank you.

## 2020-05-06 NOTE — Telephone Encounter (Signed)
Her pain is a lot better today . She was notified.

## 2020-05-09 ENCOUNTER — Inpatient Hospital Stay: Payer: 59

## 2020-05-10 ENCOUNTER — Encounter: Payer: Self-pay | Admitting: Medical Oncology

## 2020-05-10 ENCOUNTER — Telehealth: Payer: Self-pay | Admitting: Medical Oncology

## 2020-05-10 NOTE — Telephone Encounter (Signed)
Appeal letter for scans and labs ready . Attempted to call pt -no answer.

## 2020-05-11 ENCOUNTER — Ambulatory Visit: Payer: 59 | Admitting: Radiation Oncology

## 2020-05-11 NOTE — Telephone Encounter (Signed)
Pt notified to pick up letter on Monday at front desk.

## 2020-05-13 ENCOUNTER — Inpatient Hospital Stay (HOSPITAL_BASED_OUTPATIENT_CLINIC_OR_DEPARTMENT_OTHER): Payer: 59 | Admitting: Internal Medicine

## 2020-05-13 ENCOUNTER — Other Ambulatory Visit: Payer: Self-pay

## 2020-05-13 DIAGNOSIS — C7931 Secondary malignant neoplasm of brain: Secondary | ICD-10-CM | POA: Insufficient documentation

## 2020-05-13 DIAGNOSIS — C3491 Malignant neoplasm of unspecified part of right bronchus or lung: Secondary | ICD-10-CM | POA: Diagnosis not present

## 2020-05-13 DIAGNOSIS — C787 Secondary malignant neoplasm of liver and intrahepatic bile duct: Secondary | ICD-10-CM | POA: Diagnosis present

## 2020-05-13 DIAGNOSIS — J90 Pleural effusion, not elsewhere classified: Secondary | ICD-10-CM | POA: Diagnosis not present

## 2020-05-13 DIAGNOSIS — Z79899 Other long term (current) drug therapy: Secondary | ICD-10-CM | POA: Diagnosis not present

## 2020-05-13 DIAGNOSIS — Z7951 Long term (current) use of inhaled steroids: Secondary | ICD-10-CM | POA: Diagnosis not present

## 2020-05-13 DIAGNOSIS — C7951 Secondary malignant neoplasm of bone: Secondary | ICD-10-CM | POA: Diagnosis present

## 2020-05-13 DIAGNOSIS — Z7901 Long term (current) use of anticoagulants: Secondary | ICD-10-CM | POA: Diagnosis not present

## 2020-05-13 DIAGNOSIS — Z86711 Personal history of pulmonary embolism: Secondary | ICD-10-CM | POA: Diagnosis not present

## 2020-05-13 NOTE — Progress Notes (Signed)
[facility] [place], [place] [address] [phone]   New Patient Evaluation  Date of Service: 05/13/20 Patient Name: Brittany Mccarty Patient MRN: [id number] Patient DOB: [birth year] Provider: Ventura Sellers, MD  Identifying Statement:  Brittany Mccarty is a 59 y.o. female with No primary diagnosis found. who presents for initial consultation and evaluation regarding cancer associated neurologic deficits.    Referring Provider: Maryanna Shape, NP [place],  [place] [address]  Primary Cancer: NSCLC stage IV adenocarcinoma  Oncologic History:  History of Present Illness: The patient's records from the referring physician were obtained and reviewed and the patient interviewed to confirm this HPI.  Brittany Mccarty presents today to discuss abnormal brain MRI.  She underwent CNS imaging as a screening measure after diagnosis of NSCLC; this demonstrated 2 small regions possibly c/w metastases.  Lung cancer has been complicated by recurrent pleural effusions and pneumothorax, as well as subsegmental PE.  She is currently on Eliquis twice per day.  She has also started Tagrisso as of 04/20/20, ten days prior to most recent MRI.  Does describe one episode 5 years ago of brief numbness of the left arm and face which resolved on its own.  Workup was performed by a neurologist which was unremarkable.  Today she describes no neurologic deficits, no seizures, no headaches. Recently completed 3T MRI for possible SRS planning.  Medications: Current Outpatient Medications on File Prior to Visit  Medication Sig Dispense Refill  . benzonatate (TESSALON) 100 MG capsule Take 100 mg by mouth 3 (three) times daily as needed for cough.     Marland Kitchen ELIQUIS 5 MG TABS tablet Take 5 mg by mouth 2 (two) times daily.     . famotidine (PEPCID) 40 MG tablet Take 40 mg by mouth 2 (two) times daily.    . folic acid (FOLVITE) 1 MG tablet Take 1 mg  by mouth daily.    Marland Kitchen HYDROcodone-acetaminophen (NORCO/VICODIN) 5-325 MG tablet Take 1 tablet by mouth every 6 (six) hours as needed for moderate pain. 30 tablet 0  . ondansetron (ZOFRAN) 4 MG tablet Take 4 mg by mouth every 8 (eight) hours as needed for nausea or vomiting.     Marland Kitchen osimertinib mesylate (TAGRISSO) 80 MG tablet Take 1 tablet (80 mg total) by mouth daily. 30 tablet 2   No current facility-administered medications on file prior to visit.    Allergies:  Allergies  Allergen Reactions  . Other     Blood Products  . Cefdinir Rash   Past Medical History: No past medical history on file. Past Surgical History:  Past Surgical History:  Procedure Laterality Date  . IR REMOVAL OF PLURAL CATH W/CUFF  04/24/2020   Social History:  Social History   Socioeconomic History  . Marital status: Married    Spouse name: Not on file  . Number of children: Not on file  . Years of education: Not on file  . Highest education level: Not on file  Occupational History  . Not on file  Tobacco Use  . Smoking status: Never Smoker  . Smokeless tobacco: Never Used  Substance and Sexual Activity  . Alcohol use: Not Currently  . Drug use: Never  . Sexual activity: Not on file  Other Topics Concern  . Not on file  Social History Narrative  . Not on file   Social Determinants of Health   Financial Resource Strain:   . Difficulty of  Paying Living Expenses:   Food Insecurity:   . Worried About Charity fundraiser in the Last Year:   . Arboriculturist in the Last Year:   Transportation Needs:   . Film/video editor (Medical):   Marland Kitchen Lack of Transportation (Non-Medical):   Physical Activity:   . Days of Exercise per Week:   . Minutes of Exercise per Session:   Stress:   . Feeling of Stress :   Social Connections:   . Frequency of Communication with Friends and Family:   . Frequency of Social Gatherings with Friends and Family:   . Attends Religious Services:   . Active Member of  Clubs or Organizations:   . Attends Archivist Meetings:   Marland Kitchen Marital Status:   Intimate Partner Violence:   . Fear of Current or Ex-Partner:   . Emotionally Abused:   Marland Kitchen Physically Abused:   . Sexually Abused:    Family History: No family history on file.  Review of Systems: Constitutional: Doesn't report fevers, chills or abnormal weight loss Eyes: Doesn't report blurriness of vision Ears, nose, mouth, throat, and face: Doesn't report sore throat Respiratory: Doesn't report cough, dyspnea or wheezes Cardiovascular: Doesn't report palpitation, chest discomfort  Gastrointestinal:  Doesn't report nausea, constipation, diarrhea GU: Doesn't report incontinence Skin: Doesn't report skin rashes Neurological: Per HPI Musculoskeletal: Doesn't report joint pain Behavioral/Psych: Doesn't report anxiety  Physical Exam: Vitals:   05/13/20 1023  BP: (!) 142/89  Pulse: 61  Resp: 17  Temp: (!) 97.5 F (36.4 C)  SpO2: 100%   KPS: 90. General: Alert, cooperative, pleasant, in no acute distress Head: Normal EENT: No conjunctival injection or scleral icterus.  Lungs: Resp effort normal Cardiac: Regular rate Abdomen: chest tube in place Skin: No rashes cyanosis or petechiae. Extremities: No clubbing or edema  Neurologic Exam: Mental Status: Awake, alert, attentive to examiner. Oriented to self and environment. Language is fluent with intact comprehension.  Cranial Nerves: Visual acuity is grossly normal. Visual fields are full. Extra-ocular movements intact. No ptosis. Face is symmetric Motor: Tone and bulk are normal. Power is full in both arms and legs. Reflexes are symmetric, no pathologic reflexes present.  Sensory: Intact to light touch Gait: Normal.   Labs: I have reviewed the data as listed    Component Value Date/Time   NA 140 04/26/2020 0603   K 4.1 04/26/2020 0603   CL 103 04/26/2020 0603   CO2 29 04/26/2020 0603   GLUCOSE 95 04/26/2020 0603   BUN <5 (L)  04/26/2020 0603   CREATININE 0.98 04/26/2020 0603   CALCIUM 8.7 (L) 04/26/2020 0603   PROT 5.5 (L) 04/26/2020 0603   ALBUMIN 2.4 (L) 04/26/2020 0603   AST 32 04/26/2020 0603   ALT 49 (H) 04/26/2020 0603   ALKPHOS 245 (H) 04/26/2020 0603   BILITOT 0.5 04/26/2020 0603   GFRNONAA >60 04/26/2020 0603   GFRAA >60 04/26/2020 0603   Lab Results  Component Value Date   WBC 4.4 04/26/2020   NEUTROABS 4.5 02/18/2009   HGB 12.3 04/26/2020   HCT 38.9 04/26/2020   MCV 92.2 04/26/2020   PLT 336 04/26/2020    Imaging:  CT CHEST W CONTRAST  Result Date: 04/24/2020 CLINICAL DATA:  Right PleurX drain in place. Swelling about the PleurX catheter. Increasing shortness of breath. On chemotherapy. EXAM: CT CHEST, ABDOMEN, AND PELVIS WITH CONTRAST TECHNIQUE: Multidetector CT imaging of the chest, abdomen and pelvis was performed following the standard protocol during bolus  administration of intravenous contrast. CONTRAST:  123mL OMNIPAQUE IOHEXOL 300 MG/ML  SOLN COMPARISON:  February 18, 2009. FINDINGS: CT CHEST FINDINGS Cardiovascular: The heart size is normal. There is a trace pericardial effusion. There is no evidence for a thoracic aortic dissection or aneurysm. No evidence for large centrally located pulmonary embolism. Mediastinum/Nodes: --there is a centrally necrotic, peripherally calcified mass in the mediastinum measuring approximately 2.6 cm. Has significantly decreased in size since prior study in 2010. There is an enlarged subcarinal lymph node measuring approximately 1.7 cm. --No axillary lymphadenopathy. --No supraclavicular lymphadenopathy. --Normal thyroid gland. --The esophagus is unremarkable Lungs/Pleura: There is an ill-defined right infrahilar mass measuring approximately 5.5 x 4.5 cm (axial series 2, image 28). There are small areas of consolidation and ground-glass airspace opacities involving the bilateral upper lobes, right lower lobe, and right middle lobe. There are pulmonary nodules in  the left lower lobe measuring up to approximately 7 mm (axial series 4, image 79). There is some interlobular septal thickening involving the right upper lobe. There is a small right-sided pleural effusion. There is a right-sided PleurX catheter in place. One of the sideholes of the PleurX catheter is located in the patient's chest wall. There is fluid and gas within the patient's right chest wall without evidence for a definite well-formed drainable collection or abscess there is no right-sided pneumothorax. Musculoskeletal: There is a small sclerotic lesion in the T7 vertebral body. There is no acute displaced fracture. There is asymmetric right sided skin thickening involving the breast. This is felt to be reactive. Review of the MIP images confirms the above findings. CT ABDOMEN PELVIS FINDINGS Hepatobiliary: There is a small ill-defined hypodensity in hepatic segment 4A/B (axial series 2, image 48). Normal gallbladder.There is no biliary ductal dilation. Pancreas: Normal contours without ductal dilatation. No peripancreatic fluid collection. Spleen: There is a hypoattenuating lesion in the spleen measuring approximately 1.2 cm. This is indeterminate on this exam. Adrenals/Urinary Tract: --Adrenal glands: Unremarkable. --Right kidney/ureter: No hydronephrosis or radiopaque kidney stones. --Left kidney/ureter: No hydronephrosis or radiopaque kidney stones. --Urinary bladder: Unremarkable. Stomach/Bowel: --Stomach/Duodenum: No hiatal hernia or other gastric abnormality. Normal duodenal course and caliber. --Small bowel: Unremarkable. --Colon: Unremarkable. --Appendix: Normal. Vascular/Lymphatic: Normal course and caliber of the major abdominal vessels. --No retroperitoneal lymphadenopathy. --No mesenteric lymphadenopathy. --No pelvic or inguinal lymphadenopathy. Reproductive: Unremarkable Other: No ascites or free air. Mild body wall edema. Musculoskeletal. There is an area of sclerosis involving the L1 vertebral  body, concerning for a metastatic lesion. There are sclerotic lesions in the right iliac bone and right hemi sacrum concerning for metastatic disease. IMPRESSION: 1. Malpositioned right-sided PleurX catheter. One of the sideholes is located within the patient's chest wall. There is free fluid pockets of gas within the patient's right chest wall and right flank. At this time, there is no well-formed drainable fluid collection or abscess. While the fluid in pockets of gas may be secondary to the malpositioned PleurX catheter, a developing infectious process is within the differential diagnosis. 2. Small right-sided pleural effusion.  No pneumothorax. 3. Findings consistent with metastatic lung cancer including a right infrahilar mass, multiple contralateral pulmonary nodules in the left lung in addition to sclerotic osseous lesions as detailed above. Correlation with the patient's prior outside imaging is recommended. There are ill-defined lesions in the liver and spleen which should be correlated with the patient's prior outside imaging. 4. Ground-glass airspace opacities with areas of consolidation are noted in the right upper lobe and right middle lobe. These may represent  post treatment changes or developing pneumonia in the appropriate clinical setting. Electronically Signed   By: Constance Holster M.D.   On: 04/24/2020 01:48   MR Brain W Wo Contrast  Result Date: 05/01/2020 CLINICAL DATA:  Metastatic lung cancer EXAM: MRI HEAD WITHOUT AND WITH CONTRAST TECHNIQUE: Multiplanar, multiecho pulse sequences of the brain and surrounding structures were obtained without and with intravenous contrast. CONTRAST:  55mL MULTIHANCE GADOBENATE DIMEGLUMINE 529 MG/ML IV SOLN COMPARISON:  Images are unavailable. Correlation is made with report of outside MRI brain 04/20/2020 in care everywhere FINDINGS: Brain: There is no mass or abnormal enhancement identified. Focus of mild diffusion hyperintensity reflecting T2 shine  through is present in the white matter adjacent to the left lateral ventricle likely corresponding to reported peripherally enhancing lesion on the prior study. There is no abnormal signal identified in the region of parasagittal right frontal lobe reported lesion. There is no acute infarction or intracranial hemorrhage. There is no hydrocephalus or extra-axial fluid collection. Vascular: Major vessel flow voids at the skull base are preserved. Skull and upper cervical spine: Normal marrow signal is preserved. Sinuses/Orbits: Paranasal sinuses are aerated. Orbits are unremarkable. Other: Sella is unremarkable.  Mastoid air cells are clear. IMPRESSION: There is no evidence of intracranial metastatic disease. 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 No abnormal signal identified in the region of reported parasagittal right frontal lobe metastasis, which presumably was also an infarct. Outside imaging could be uploaded for direct comparison if desired. Electronically Signed   By: Macy Mis M.D.   On: 05/01/2020 07:56   CT ABDOMEN PELVIS W CONTRAST  Result Date: 04/24/2020 CLINICAL DATA:  Right PleurX drain in place. Swelling about the PleurX catheter. Increasing shortness of breath. On chemotherapy. EXAM: CT CHEST, ABDOMEN, AND PELVIS WITH CONTRAST TECHNIQUE: Multidetector CT imaging of the chest, abdomen and pelvis was performed following the standard protocol during bolus administration of intravenous contrast. CONTRAST:  117mL OMNIPAQUE IOHEXOL 300 MG/ML  SOLN COMPARISON:  February 18, 2009. FINDINGS: CT CHEST FINDINGS Cardiovascular: The heart size is normal. There is a trace pericardial effusion. There is no evidence for a thoracic aortic dissection or aneurysm. No evidence for large centrally located pulmonary embolism. Mediastinum/Nodes: --there is a centrally necrotic, peripherally calcified mass in the mediastinum measuring approximately  2.6 cm. Has significantly decreased in size since prior study in 2010. There is an enlarged subcarinal lymph node measuring approximately 1.7 cm. --No axillary lymphadenopathy. --No supraclavicular lymphadenopathy. --Normal thyroid gland. --The esophagus is unremarkable Lungs/Pleura: There is an ill-defined right infrahilar mass measuring approximately 5.5 x 4.5 cm (axial series 2, image 28). There are small areas of consolidation and ground-glass airspace opacities involving the bilateral upper lobes, right lower lobe, and right middle lobe. There are pulmonary nodules in the left lower lobe measuring up to approximately 7 mm (axial series 4, image 79). There is some interlobular septal thickening involving the right upper lobe. There is a small right-sided pleural effusion. There is a right-sided PleurX catheter in place. One of the sideholes of the PleurX catheter is located in the patient's chest wall. There is fluid and gas within the patient's right chest wall without evidence for a definite well-formed drainable collection or abscess there is no right-sided pneumothorax. Musculoskeletal: There is a small sclerotic lesion in the T7 vertebral body. There is no acute displaced fracture. There is asymmetric right sided skin thickening involving the breast. This is felt to be reactive. Review of  the MIP images confirms the above findings. CT ABDOMEN PELVIS FINDINGS Hepatobiliary: There is a small ill-defined hypodensity in hepatic segment 4A/B (axial series 2, image 48). Normal gallbladder.There is no biliary ductal dilation. Pancreas: Normal contours without ductal dilatation. No peripancreatic fluid collection. Spleen: There is a hypoattenuating lesion in the spleen measuring approximately 1.2 cm. This is indeterminate on this exam. Adrenals/Urinary Tract: --Adrenal glands: Unremarkable. --Right kidney/ureter: No hydronephrosis or radiopaque kidney stones. --Left kidney/ureter: No hydronephrosis or radiopaque  kidney stones. --Urinary bladder: Unremarkable. Stomach/Bowel: --Stomach/Duodenum: No hiatal hernia or other gastric abnormality. Normal duodenal course and caliber. --Small bowel: Unremarkable. --Colon: Unremarkable. --Appendix: Normal. Vascular/Lymphatic: Normal course and caliber of the major abdominal vessels. --No retroperitoneal lymphadenopathy. --No mesenteric lymphadenopathy. --No pelvic or inguinal lymphadenopathy. Reproductive: Unremarkable Other: No ascites or free air. Mild body wall edema. Musculoskeletal. There is an area of sclerosis involving the L1 vertebral body, concerning for a metastatic lesion. There are sclerotic lesions in the right iliac bone and right hemi sacrum concerning for metastatic disease. IMPRESSION: 1. Malpositioned right-sided PleurX catheter. One of the sideholes is located within the patient's chest wall. There is free fluid pockets of gas within the patient's right chest wall and right flank. At this time, there is no well-formed drainable fluid collection or abscess. While the fluid in pockets of gas may be secondary to the malpositioned PleurX catheter, a developing infectious process is within the differential diagnosis. 2. Small right-sided pleural effusion.  No pneumothorax. 3. Findings consistent with metastatic lung cancer including a right infrahilar mass, multiple contralateral pulmonary nodules in the left lung in addition to sclerotic osseous lesions as detailed above. Correlation with the patient's prior outside imaging is recommended. There are ill-defined lesions in the liver and spleen which should be correlated with the patient's prior outside imaging. 4. Ground-glass airspace opacities with areas of consolidation are noted in the right upper lobe and right middle lobe. These may represent post treatment changes or developing pneumonia in the appropriate clinical setting. Electronically Signed   By: Constance Holster M.D.   On: 04/24/2020 01:48   DG CHEST  PORT 1 VIEW  Result Date: 04/25/2020 CLINICAL DATA:  Right pleural effusion EXAM: PORTABLE CHEST 1 VIEW COMPARISON:  04/24/2020 FINDINGS: Cardiomegaly. Small right pleural effusion. Right lung airspace disease again noted, unchanged. Left lung clear. No acute bony abnormality. IMPRESSION: Stable small right pleural effusion with right lung airspace disease. Electronically Signed   By: Rolm Baptise M.D.   On: 04/25/2020 09:15   DG Chest Port 1 View  Result Date: 04/24/2020 CLINICAL DATA:  Right pleural effusion, removed partially dislodged drain catheter EXAM: PORTABLE CHEST 1 VIEW COMPARISON:  04/23/2020 FINDINGS: The heart size and mediastinal contours are within normal limits. Interval removal of a previously seen right-sided tunneled pleural catheter. Unchanged small right pleural effusion associated atelectasis or consolidation. Left lung is normally aerated. The visualized skeletal structures are unremarkable. IMPRESSION: Interval removal of a previously seen right-sided tunneled pleural catheter. Unchanged small right pleural effusion and associated atelectasis or consolidation. Electronically Signed   By: Eddie Candle M.D.   On: 04/24/2020 13:54   DG Chest Port 1 View  Result Date: 04/23/2020 CLINICAL DATA:  Status post right-sided chest drain placed with subsequent right-sided chest swelling. EXAM: PORTABLE CHEST 1 VIEW COMPARISON:  August 24, 2013 FINDINGS: A right-sided chest tube is seen with its distal tip overlying the medial aspect of the right lung base. There is mild right basilar atelectasis with a small right pleural  effusion. Mild right apical pleural fluid is also seen. Mild atelectasis is seen within the mid right lung. Mild right-sided volume loss is seen. No pneumothorax is identified. The heart size and mediastinal contours are within normal limits. The visualized skeletal structures are unremarkable. IMPRESSION: 1. Right-sided chest tube placement. 2. Mild right basilar  atelectasis with a small right pleural effusion. 3. Mild atelectasis within the mid right lung. Electronically Signed   By: Virgina Norfolk M.D.   On: 04/23/2020 21:13   IR Removal Of Plural Cath W/Cuff  Result Date: 04/26/2020 INDICATION: Patient with history of metastatic adenocarcinoma the with recurrent right-sided pleural effusion; status post tunneled pleural catheter placement on 04/18/20 at [facility]; presented to the ED with complaints of right-sided pain and swelling. Imaging showed a tube sidehole located within the patient's chest wall. Request to IR for bedside removal of tunneled pleural catheter. EXAM: REMOVAL TUNNELED PLEURAL CATHETER MEDICATIONS: None. ANESTHESIA/SEDATION: None FLUOROSCOPY TIME:  None COMPLICATIONS: None immediate. PROCEDURE: Informed written consent was obtained from the patient after a thorough discussion of the procedural risks, benefits and alternatives. All questions were addressed. Maximal Sterile Barrier Technique was utilized including caps, mask, sterile gowns, sterile gloves, sterile drape, hand hygiene and skin antiseptic. A timeout was performed prior to the initiation of the procedure. The patient's right chest and catheter was prepped and draped in a normal sterile fashion. Using gentle manual traction the cuff of the catheter was exposed and the catheter was removed in it's entirety. Pressure was held until hemostasis was obtained. A sterile dressing was applied. The patient tolerated the procedure well with no immediate complications. IMPRESSION: Successful catheter removal as described above. Read by Candiss Norse, PA-C Electronically Signed   By: Markus Daft M.D.   On: 04/26/2020 10:30     Assessment/Plan Brain metastases (HCC)  Brittany Mccarty is clinically stable today.  MRI demonstrates one focus of DWI signal intensity with very faint enhancement adjacent to the left lateral ventricle.  Etiology is suspected metastases although this  could possibly be sub-clinical infarct.  Decrease in signal intensity from prior MRI could represent treatment response from TKI tagrisso which can penetrate CNS.   Case was discussed in brain/spine tumor board, agreed this can be followed for now with imaging, SRS can be deferred.  We ask that Columbia Pandey return to clinic in 3 months following next brain MRI, or sooner as needed.  We spent twenty additional minutes teaching regarding the natural history, biology, and historical experience in the treatment of neurologic complications of cancer.   We appreciate the opportunity to participate in the care of [facility].   All questions were answered. The patient knows to call the clinic with any problems, questions or concerns. No barriers to learning were detected.  The total time spent in the encounter was 40 minutes and more than 50% was on counseling and review of test results   Ventura Sellers, MD Medical Director of Neuro-Oncology [facility] [place] 05/13/20 3:06 PM

## 2020-05-16 ENCOUNTER — Inpatient Hospital Stay (HOSPITAL_BASED_OUTPATIENT_CLINIC_OR_DEPARTMENT_OTHER): Payer: 59 | Admitting: Internal Medicine

## 2020-05-16 ENCOUNTER — Other Ambulatory Visit: Payer: Self-pay

## 2020-05-16 ENCOUNTER — Telehealth: Payer: Self-pay | Admitting: Internal Medicine

## 2020-05-16 ENCOUNTER — Encounter: Payer: Self-pay | Admitting: Internal Medicine

## 2020-05-16 ENCOUNTER — Inpatient Hospital Stay: Payer: 59

## 2020-05-16 VITALS — BP 143/81 | HR 68 | Temp 97.9°F | Resp 20 | Ht 64.0 in | Wt 176.9 lb

## 2020-05-16 DIAGNOSIS — R0781 Pleurodynia: Secondary | ICD-10-CM

## 2020-05-16 DIAGNOSIS — C7931 Secondary malignant neoplasm of brain: Secondary | ICD-10-CM | POA: Diagnosis not present

## 2020-05-16 DIAGNOSIS — C3491 Malignant neoplasm of unspecified part of right bronchus or lung: Secondary | ICD-10-CM

## 2020-05-16 DIAGNOSIS — Z5111 Encounter for antineoplastic chemotherapy: Secondary | ICD-10-CM | POA: Diagnosis not present

## 2020-05-16 DIAGNOSIS — C3490 Malignant neoplasm of unspecified part of unspecified bronchus or lung: Secondary | ICD-10-CM

## 2020-05-16 LAB — CBC WITH DIFFERENTIAL (CANCER CENTER ONLY)
Abs Immature Granulocytes: 0.01 10*3/uL (ref 0.00–0.07)
Basophils Absolute: 0 10*3/uL (ref 0.0–0.1)
Basophils Relative: 1 %
Eosinophils Absolute: 0 10*3/uL (ref 0.0–0.5)
Eosinophils Relative: 1 %
HCT: 39.1 % (ref 36.0–46.0)
Hemoglobin: 12.7 g/dL (ref 12.0–15.0)
Immature Granulocytes: 0 %
Lymphocytes Relative: 19 %
Lymphs Abs: 1.1 10*3/uL (ref 0.7–4.0)
MCH: 28.6 pg (ref 26.0–34.0)
MCHC: 32.5 g/dL (ref 30.0–36.0)
MCV: 88.1 fL (ref 80.0–100.0)
Monocytes Absolute: 0.4 10*3/uL (ref 0.1–1.0)
Monocytes Relative: 6 %
Neutro Abs: 4.3 10*3/uL (ref 1.7–7.7)
Neutrophils Relative %: 73 %
Platelet Count: 200 10*3/uL (ref 150–400)
RBC: 4.44 MIL/uL (ref 3.87–5.11)
RDW: 13.7 % (ref 11.5–15.5)
WBC Count: 5.9 10*3/uL (ref 4.0–10.5)
nRBC: 0 % (ref 0.0–0.2)

## 2020-05-16 LAB — CMP (CANCER CENTER ONLY)
ALT: 19 U/L (ref 0–44)
AST: 22 U/L (ref 15–41)
Albumin: 3.4 g/dL — ABNORMAL LOW (ref 3.5–5.0)
Alkaline Phosphatase: 120 U/L (ref 38–126)
Anion gap: 10 (ref 5–15)
BUN: 11 mg/dL (ref 6–20)
CO2: 25 mmol/L (ref 22–32)
Calcium: 9.3 mg/dL (ref 8.9–10.3)
Chloride: 104 mmol/L (ref 98–111)
Creatinine: 0.92 mg/dL (ref 0.44–1.00)
GFR, Est AFR Am: >60 mL/min (ref >60)
GFR, Estimated: >60 mL/min (ref >60)
Glucose, Bld: 92 mg/dL (ref 70–99)
Potassium: 4.1 mmol/L (ref 3.5–5.1)
Sodium: 139 mmol/L (ref 135–145)
Total Bilirubin: 0.4 mg/dL (ref 0.3–1.2)
Total Protein: 6.8 g/dL (ref 6.5–8.1)

## 2020-05-16 MED ORDER — OXYCODONE-ACETAMINOPHEN 5-325 MG PO TABS: 1.0000 | ORAL_TABLET | Freq: Three times a day (TID) | ORAL | 0 refills | Status: DC | PRN

## 2020-05-16 NOTE — Telephone Encounter (Signed)
Scheduled appt per 6/11 los.  A calendar will be mailed out.

## 2020-05-16 NOTE — Progress Notes (Signed)
[place] [phone]   Fax:([phone]) [phone]  OFFICE PROGRESS NOTE  Patient, No Pcp Per No address on file  DIAGNOSIS: Stage IV (T3, N2, M1c) non-small cell lung cancer, adenocarcinoma with positive EGFR mutation with deletion in exon 19 and presented with large infrahilar mass in addition to mediastinal lymphadenopathy as well as metastatic disease to the liver and bone with suspicious brain metastasis that significantly improved after the patient started treatment with Tagrisso and was not evident on the repeat MRI of the brain few days ago.  This was diagnosed in May 2021.  PRIOR THERAPY: None  CURRENT THERAPY: Tagrisso 80 mg p.o. daily.  First dose started Apr 21, 2020.  INTERVAL HISTORY: Brittany Mccarty 59 y.o. female returns to the clinic today for follow-up visit accompanied by her husband.  The patient is feeling fine today with no concerning complaints except for mild pain on the left side of the chest from the chest tube insertion site scar.  She denied having any shortness of breath, cough or hemoptysis.  She denied having any fever or chills.  She has no nausea, vomiting, diarrhea or constipation.  She has no skin rash or paronychia.  She continues to tolerate her treatment with Tagrisso fairly well.  She is here today for evaluation and repeat blood work.  MEDICAL HISTORY:No past medical history on file.  ALLERGIES:  is allergic to other and cefdinir.  MEDICATIONS:  Current Outpatient Medications  Medication Sig Dispense Refill  . benzonatate (TESSALON) 100 MG capsule Take 100 mg by mouth 3 (three) times daily as needed for cough.     Marland Kitchen ELIQUIS 5 MG TABS tablet Take 5 mg by mouth 2 (two) times daily.     . famotidine (PEPCID) 40 MG tablet Take 40 mg by mouth 2 (two) times daily.    Marland Kitchen HYDROcodone-acetaminophen (NORCO/VICODIN) 5-325 MG tablet Take 1 tablet by mouth every 6 (six) hours as needed for moderate pain. 30 tablet 0  . ondansetron  (ZOFRAN) 4 MG tablet Take 4 mg by mouth every 8 (eight) hours as needed for nausea or vomiting.     Marland Kitchen osimertinib mesylate (TAGRISSO) 80 MG tablet Take 1 tablet (80 mg total) by mouth daily. 30 tablet 2  . folic acid (FOLVITE) 1 MG tablet Take 1 mg by mouth daily. (Patient not taking: Reported on 05/16/2020)     No current facility-administered medications for this visit.    SURGICAL HISTORY:  Past Surgical History:  Procedure Laterality Date  . IR REMOVAL OF PLURAL CATH W/CUFF  04/24/2020    REVIEW OF SYSTEMS:  A comprehensive review of systems was negative.   PHYSICAL EXAMINATION: General appearance: alert, cooperative and no distress Head: Normocephalic, without obvious abnormality, atraumatic Neck: no adenopathy, no JVD, supple, symmetrical, trachea midline and thyroid not enlarged, symmetric, no tenderness/mass/nodules Lymph nodes: Cervical, supraclavicular, and axillary nodes normal. Resp: clear to auscultation bilaterally Back: symmetric, no curvature. ROM normal. No CVA tenderness. Cardio: regular rate and rhythm, S1, S2 normal, no murmur, click, rub or gallop GI: soft, non-tender; bowel sounds normal; no masses,  no organomegaly Extremities: extremities normal, atraumatic, no cyanosis or edema  ECOG PERFORMANCE STATUS: 1 - Symptomatic but completely ambulatory  Blood pressure (!) 143/81, pulse 68, temperature 97.9 F (36.6 C), temperature source Temporal, resp. rate 20, height 5' 4"  (1.626 m), weight 176 lb 14.4 oz (80.2 kg), SpO2 100 %.  LABORATORY DATA: Lab Results  Component Value Date   WBC 5.9  05/16/2020   HGB 12.7 05/16/2020   HCT 39.1 05/16/2020   MCV 88.1 05/16/2020   PLT 200 05/16/2020      Chemistry      Component Value Date/Time   NA 140 04/26/2020 0603   K 4.1 04/26/2020 0603   CL 103 04/26/2020 0603   CO2 29 04/26/2020 0603   BUN <5 (L) 04/26/2020 0603   CREATININE 0.98 04/26/2020 0603      Component Value Date/Time   CALCIUM 8.7 (L) 04/26/2020  0603   ALKPHOS 245 (H) 04/26/2020 0603   AST 32 04/26/2020 0603   ALT 49 (H) 04/26/2020 0603   BILITOT 0.5 04/26/2020 0603       RADIOGRAPHIC STUDIES: CT CHEST W CONTRAST  Result Date: 04/24/2020 CLINICAL DATA:  Right PleurX drain in place. Swelling about the PleurX catheter. Increasing shortness of breath. On chemotherapy. EXAM: CT CHEST, ABDOMEN, AND PELVIS WITH CONTRAST TECHNIQUE: Multidetector CT imaging of the chest, abdomen and pelvis was performed following the standard protocol during bolus administration of intravenous contrast. CONTRAST:  138m OMNIPAQUE IOHEXOL 300 MG/ML  SOLN COMPARISON:  February 18, 2009. FINDINGS: CT CHEST FINDINGS Cardiovascular: The heart size is normal. There is a trace pericardial effusion. There is no evidence for a thoracic aortic dissection or aneurysm. No evidence for large centrally located pulmonary embolism. Mediastinum/Nodes: --there is a centrally necrotic, peripherally calcified mass in the mediastinum measuring approximately 2.6 cm. Has significantly decreased in size since prior study in 2010. There is an enlarged subcarinal lymph node measuring approximately 1.7 cm. --No axillary lymphadenopathy. --No supraclavicular lymphadenopathy. --Normal thyroid gland. --The esophagus is unremarkable Lungs/Pleura: There is an ill-defined right infrahilar mass measuring approximately 5.5 x 4.5 cm (axial series 2, image 28). There are small areas of consolidation and ground-glass airspace opacities involving the bilateral upper lobes, right lower lobe, and right middle lobe. There are pulmonary nodules in the left lower lobe measuring up to approximately 7 mm (axial series 4, image 79). There is some interlobular septal thickening involving the right upper lobe. There is a small right-sided pleural effusion. There is a right-sided PleurX catheter in place. One of the sideholes of the PleurX catheter is located in the patient's chest wall. There is fluid and gas within the  patient's right chest wall without evidence for a definite well-formed drainable collection or abscess there is no right-sided pneumothorax. Musculoskeletal: There is a small sclerotic lesion in the T7 vertebral body. There is no acute displaced fracture. There is asymmetric right sided skin thickening involving the breast. This is felt to be reactive. Review of the MIP images confirms the above findings. CT ABDOMEN PELVIS FINDINGS Hepatobiliary: There is a small ill-defined hypodensity in hepatic segment 4A/B (axial series 2, image 48). Normal gallbladder.There is no biliary ductal dilation. Pancreas: Normal contours without ductal dilatation. No peripancreatic fluid collection. Spleen: There is a hypoattenuating lesion in the spleen measuring approximately 1.2 cm. This is indeterminate on this exam. Adrenals/Urinary Tract: --Adrenal glands: Unremarkable. --Right kidney/ureter: No hydronephrosis or radiopaque kidney stones. --Left kidney/ureter: No hydronephrosis or radiopaque kidney stones. --Urinary bladder: Unremarkable. Stomach/Bowel: --Stomach/Duodenum: No hiatal hernia or other gastric abnormality. Normal duodenal course and caliber. --Small bowel: Unremarkable. --Colon: Unremarkable. --Appendix: Normal. Vascular/Lymphatic: Normal course and caliber of the major abdominal vessels. --No retroperitoneal lymphadenopathy. --No mesenteric lymphadenopathy. --No pelvic or inguinal lymphadenopathy. Reproductive: Unremarkable Other: No ascites or free air. Mild body wall edema. Musculoskeletal. There is an area of sclerosis involving the L1 vertebral body, concerning for a metastatic  lesion. There are sclerotic lesions in the right iliac bone and right hemi sacrum concerning for metastatic disease. IMPRESSION: 1. Malpositioned right-sided PleurX catheter. One of the sideholes is located within the patient's chest wall. There is free fluid pockets of gas within the patient's right chest wall and right flank. At this  time, there is no well-formed drainable fluid collection or abscess. While the fluid in pockets of gas may be secondary to the malpositioned PleurX catheter, a developing infectious process is within the differential diagnosis. 2. Small right-sided pleural effusion.  No pneumothorax. 3. Findings consistent with metastatic lung cancer including a right infrahilar mass, multiple contralateral pulmonary nodules in the left lung in addition to sclerotic osseous lesions as detailed above. Correlation with the patient's prior outside imaging is recommended. There are ill-defined lesions in the liver and spleen which should be correlated with the patient's prior outside imaging. 4. Ground-glass airspace opacities with areas of consolidation are noted in the right upper lobe and right middle lobe. These may represent post treatment changes or developing pneumonia in the appropriate clinical setting. Electronically Signed   By: Constance Holster M.D.   On: 04/24/2020 01:48   MR Brain W Wo Contrast  Result Date: 05/01/2020 CLINICAL DATA:  Metastatic lung cancer EXAM: MRI HEAD WITHOUT AND WITH CONTRAST TECHNIQUE: Multiplanar, multiecho pulse sequences of the brain and surrounding structures were obtained without and with intravenous contrast. CONTRAST:  78m MULTIHANCE GADOBENATE DIMEGLUMINE 529 MG/ML IV SOLN COMPARISON:  Images are unavailable. Correlation is made with report of outside MRI brain 04/20/2020 in care everywhere FINDINGS: Brain: There is no mass or abnormal enhancement identified. Focus of mild diffusion hyperintensity reflecting T2 shine through is present in the white matter adjacent to the left lateral ventricle likely corresponding to reported peripherally enhancing lesion on the prior study. There is no abnormal signal identified in the region of parasagittal right frontal lobe reported lesion. There is no acute infarction or intracranial hemorrhage. There is no hydrocephalus or extra-axial fluid  collection. Vascular: Major vessel flow voids at the skull base are preserved. Skull and upper cervical spine: Normal marrow signal is preserved. Sinuses/Orbits: Paranasal sinuses are aerated. Orbits are unremarkable. Other: Sella is unremarkable.  Mastoid air cells are clear. IMPRESSION: There is no evidence of intracranial metastatic disease. 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 No abnormal signal identified in the region of reported parasagittal right frontal lobe metastasis, which presumably was also an infarct. Outside imaging could be uploaded for direct comparison if desired. Electronically Signed   By: PMacy MisM.D.   On: 05/01/2020 07:56   CT ABDOMEN PELVIS W CONTRAST  Result Date: 04/24/2020 CLINICAL DATA:  Right PleurX drain in place. Swelling about the PleurX catheter. Increasing shortness of breath. On chemotherapy. EXAM: CT CHEST, ABDOMEN, AND PELVIS WITH CONTRAST TECHNIQUE: Multidetector CT imaging of the chest, abdomen and pelvis was performed following the standard protocol during bolus administration of intravenous contrast. CONTRAST:  1011mOMNIPAQUE IOHEXOL 300 MG/ML  SOLN COMPARISON:  February 18, 2009. FINDINGS: CT CHEST FINDINGS Cardiovascular: The heart size is normal. There is a trace pericardial effusion. There is no evidence for a thoracic aortic dissection or aneurysm. No evidence for large centrally located pulmonary embolism. Mediastinum/Nodes: --there is a centrally necrotic, peripherally calcified mass in the mediastinum measuring approximately 2.6 cm. Has significantly decreased in size since prior study in 2010. There is an enlarged subcarinal lymph node measuring approximately 1.7 cm. --No axillary lymphadenopathy. --  No supraclavicular lymphadenopathy. --Normal thyroid gland. --The esophagus is unremarkable Lungs/Pleura: There is an ill-defined right infrahilar mass measuring approximately 5.5 x 4.5 cm  (axial series 2, image 28). There are small areas of consolidation and ground-glass airspace opacities involving the bilateral upper lobes, right lower lobe, and right middle lobe. There are pulmonary nodules in the left lower lobe measuring up to approximately 7 mm (axial series 4, image 79). There is some interlobular septal thickening involving the right upper lobe. There is a small right-sided pleural effusion. There is a right-sided PleurX catheter in place. One of the sideholes of the PleurX catheter is located in the patient's chest wall. There is fluid and gas within the patient's right chest wall without evidence for a definite well-formed drainable collection or abscess there is no right-sided pneumothorax. Musculoskeletal: There is a small sclerotic lesion in the T7 vertebral body. There is no acute displaced fracture. There is asymmetric right sided skin thickening involving the breast. This is felt to be reactive. Review of the MIP images confirms the above findings. CT ABDOMEN PELVIS FINDINGS Hepatobiliary: There is a small ill-defined hypodensity in hepatic segment 4A/B (axial series 2, image 48). Normal gallbladder.There is no biliary ductal dilation. Pancreas: Normal contours without ductal dilatation. No peripancreatic fluid collection. Spleen: There is a hypoattenuating lesion in the spleen measuring approximately 1.2 cm. This is indeterminate on this exam. Adrenals/Urinary Tract: --Adrenal glands: Unremarkable. --Right kidney/ureter: No hydronephrosis or radiopaque kidney stones. --Left kidney/ureter: No hydronephrosis or radiopaque kidney stones. --Urinary bladder: Unremarkable. Stomach/Bowel: --Stomach/Duodenum: No hiatal hernia or other gastric abnormality. Normal duodenal course and caliber. --Small bowel: Unremarkable. --Colon: Unremarkable. --Appendix: Normal. Vascular/Lymphatic: Normal course and caliber of the major abdominal vessels. --No retroperitoneal lymphadenopathy. --No mesenteric  lymphadenopathy. --No pelvic or inguinal lymphadenopathy. Reproductive: Unremarkable Other: No ascites or free air. Mild body wall edema. Musculoskeletal. There is an area of sclerosis involving the L1 vertebral body, concerning for a metastatic lesion. There are sclerotic lesions in the right iliac bone and right hemi sacrum concerning for metastatic disease. IMPRESSION: 1. Malpositioned right-sided PleurX catheter. One of the sideholes is located within the patient's chest wall. There is free fluid pockets of gas within the patient's right chest wall and right flank. At this time, there is no well-formed drainable fluid collection or abscess. While the fluid in pockets of gas may be secondary to the malpositioned PleurX catheter, a developing infectious process is within the differential diagnosis. 2. Small right-sided pleural effusion.  No pneumothorax. 3. Findings consistent with metastatic lung cancer including a right infrahilar mass, multiple contralateral pulmonary nodules in the left lung in addition to sclerotic osseous lesions as detailed above. Correlation with the patient's prior outside imaging is recommended. There are ill-defined lesions in the liver and spleen which should be correlated with the patient's prior outside imaging. 4. Ground-glass airspace opacities with areas of consolidation are noted in the right upper lobe and right middle lobe. These may represent post treatment changes or developing pneumonia in the appropriate clinical setting. Electronically Signed   By: Constance Holster M.D.   On: 04/24/2020 01:48   DG CHEST PORT 1 VIEW  Result Date: 04/25/2020 CLINICAL DATA:  Right pleural effusion EXAM: PORTABLE CHEST 1 VIEW COMPARISON:  04/24/2020 FINDINGS: Cardiomegaly. Small right pleural effusion. Right lung airspace disease again noted, unchanged. Left lung clear. No acute bony abnormality. IMPRESSION: Stable small right pleural effusion with right lung airspace disease.  Electronically Signed   By: Rolm Baptise M.D.  On: 04/25/2020 09:15   DG Chest Port 1 View  Result Date: 04/24/2020 CLINICAL DATA:  Right pleural effusion, removed partially dislodged drain catheter EXAM: PORTABLE CHEST 1 VIEW COMPARISON:  04/23/2020 FINDINGS: The heart size and mediastinal contours are within normal limits. Interval removal of a previously seen right-sided tunneled pleural catheter. Unchanged small right pleural effusion associated atelectasis or consolidation. Left lung is normally aerated. The visualized skeletal structures are unremarkable. IMPRESSION: Interval removal of a previously seen right-sided tunneled pleural catheter. Unchanged small right pleural effusion and associated atelectasis or consolidation. Electronically Signed   By: Eddie Candle M.D.   On: 04/24/2020 13:54   DG Chest Port 1 View  Result Date: 04/23/2020 CLINICAL DATA:  Status post right-sided chest drain placed with subsequent right-sided chest swelling. EXAM: PORTABLE CHEST 1 VIEW COMPARISON:  August 24, 2013 FINDINGS: A right-sided chest tube is seen with its distal tip overlying the medial aspect of the right lung base. There is mild right basilar atelectasis with a small right pleural effusion. Mild right apical pleural fluid is also seen. Mild atelectasis is seen within the mid right lung. Mild right-sided volume loss is seen. No pneumothorax is identified. The heart size and mediastinal contours are within normal limits. The visualized skeletal structures are unremarkable. IMPRESSION: 1. Right-sided chest tube placement. 2. Mild right basilar atelectasis with a small right pleural effusion. 3. Mild atelectasis within the mid right lung. Electronically Signed   By: Virgina Norfolk M.D.   On: 04/23/2020 21:13   IR Removal Of Plural Cath W/Cuff  Result Date: 04/26/2020 INDICATION: Patient with history of metastatic adenocarcinoma the with recurrent right-sided pleural effusion; status post tunneled  pleural catheter placement on 04/18/20 at [facility]; presented to the ED with complaints of right-sided pain and swelling. Imaging showed a tube sidehole located within the patient's chest wall. Request to IR for bedside removal of tunneled pleural catheter. EXAM: REMOVAL TUNNELED PLEURAL CATHETER MEDICATIONS: None. ANESTHESIA/SEDATION: None FLUOROSCOPY TIME:  None COMPLICATIONS: None immediate. PROCEDURE: Informed written consent was obtained from the patient after a thorough discussion of the procedural risks, benefits and alternatives. All questions were addressed. Maximal Sterile Barrier Technique was utilized including caps, mask, sterile gowns, sterile gloves, sterile drape, hand hygiene and skin antiseptic. A timeout was performed prior to the initiation of the procedure. The patient's right chest and catheter was prepped and draped in a normal sterile fashion. Using gentle manual traction the cuff of the catheter was exposed and the catheter was removed in it's entirety. Pressure was held until hemostasis was obtained. A sterile dressing was applied. The patient tolerated the procedure well with no immediate complications. IMPRESSION: Successful catheter removal as described above. Read by Candiss Norse, PA-C Electronically Signed   By: Markus Daft M.D.   On: 04/26/2020 10:30    ASSESSMENT AND PLAN: Stage IV (T3, N2, M1c) non-small cell lung cancer, adenocarcinoma with positive EGFR mutation with deletion in exon 19 and presented with large infrahilar mass in addition to mediastinal lymphadenopathy as well as metastatic disease to the liver and bone with suspicious brain metastasis that significantly improved after the patient started treatment with Tagrisso and was not evident on the repeat MRI of the brain few days ago.  This was diagnosed in May 2021. The patient is currently undergoing treatment with Tagrisso 80 mg p.o. daily started Apr 21, 2020.  She continues to tolerate her treatment  well with no concerning adverse effects. I recommended for the patient to continue her  treatment with Tagrisso with the same dose. I will see her back for follow-up visit in 1 months for evaluation with repeat CT scan of the chest, abdomen pelvis for restaging of her disease. For pain management I will give her prescription for Percocet.  She will had hydrocodone that is not helping her much. The patient was advised to call immediately if she has any concerning symptoms in the interval. The patient voices understanding of current disease status and treatment options and is in agreement with the current care plan.  All questions were answered. The patient knows to call the clinic with any problems, questions or concerns. We can certainly see the patient much sooner if necessary.  Disclaimer: This note was dictated with voice recognition software. Similar sounding words can inadvertently be transcribed and may not be corrected upon review.

## 2020-05-16 NOTE — Telephone Encounter (Signed)
Scheduled per 6/14 los. Printed avs and calendar for pt.

## 2020-05-31 ENCOUNTER — Encounter: Payer: Self-pay | Admitting: *Deleted

## 2020-05-31 ENCOUNTER — Other Ambulatory Visit: Payer: Self-pay | Admitting: Internal Medicine

## 2020-06-01 ENCOUNTER — Other Ambulatory Visit: Payer: Self-pay | Admitting: Internal Medicine

## 2020-06-01 ENCOUNTER — Telehealth: Payer: Self-pay | Admitting: Medical Oncology

## 2020-06-01 MED ORDER — OXYCODONE-ACETAMINOPHEN 5-325 MG PO TABS: 1.0000 | ORAL_TABLET | Freq: Three times a day (TID) | ORAL | 0 refills | Status: DC | PRN

## 2020-06-01 NOTE — Telephone Encounter (Signed)
Refill sent to [place].

## 2020-06-01 NOTE — Telephone Encounter (Signed)
Pain  Med refill requested. "[place], [facility] Nurse Cc I called the pharmacy and they said i needef to call you, i needed a refill on my pain medicine, i have 6 left and i ve been needing about 3 for each 24 hour period, thank you Marlaine Arey "

## 2020-06-02 ENCOUNTER — Encounter: Payer: Self-pay | Admitting: Medical Oncology

## 2020-06-03 ENCOUNTER — Other Ambulatory Visit: Payer: Self-pay | Admitting: Radiation Therapy

## 2020-06-13 ENCOUNTER — Ambulatory Visit (HOSPITAL_COMMUNITY)
Admission: RE | Admit: 2020-06-13 | Discharge: 2020-06-13 | Disposition: A | Discharge status: Home / Self Care | Payer: 59 | Source: Ambulatory Visit | Attending: Internal Medicine | Admitting: Internal Medicine

## 2020-06-13 ENCOUNTER — Encounter (HOSPITAL_COMMUNITY): Payer: Self-pay

## 2020-06-13 ENCOUNTER — Inpatient Hospital Stay: Payer: 59 | Attending: Internal Medicine

## 2020-06-13 ENCOUNTER — Other Ambulatory Visit: Payer: Self-pay

## 2020-06-13 DIAGNOSIS — C787 Secondary malignant neoplasm of liver and intrahepatic bile duct: Secondary | ICD-10-CM | POA: Insufficient documentation

## 2020-06-13 DIAGNOSIS — C3490 Malignant neoplasm of unspecified part of unspecified bronchus or lung: Secondary | ICD-10-CM

## 2020-06-13 DIAGNOSIS — Z7901 Long term (current) use of anticoagulants: Secondary | ICD-10-CM | POA: Insufficient documentation

## 2020-06-13 DIAGNOSIS — C7951 Secondary malignant neoplasm of bone: Secondary | ICD-10-CM | POA: Insufficient documentation

## 2020-06-13 DIAGNOSIS — C3491 Malignant neoplasm of unspecified part of right bronchus or lung: Secondary | ICD-10-CM | POA: Insufficient documentation

## 2020-06-13 DIAGNOSIS — I10 Essential (primary) hypertension: Secondary | ICD-10-CM | POA: Insufficient documentation

## 2020-06-13 DIAGNOSIS — C7931 Secondary malignant neoplasm of brain: Secondary | ICD-10-CM | POA: Insufficient documentation

## 2020-06-13 DIAGNOSIS — Z79899 Other long term (current) drug therapy: Secondary | ICD-10-CM | POA: Insufficient documentation

## 2020-06-13 HISTORY — DX: Malignant (primary) neoplasm, unspecified: C80.1

## 2020-06-13 LAB — CBC WITH DIFFERENTIAL (CANCER CENTER ONLY)
Abs Immature Granulocytes: 0 10*3/uL (ref 0.00–0.07)
Basophils Absolute: 0 10*3/uL (ref 0.0–0.1)
Basophils Relative: 0 %
Eosinophils Absolute: 0 10*3/uL (ref 0.0–0.5)
Eosinophils Relative: 1 %
HCT: 41.3 % (ref 36.0–46.0)
Hemoglobin: 13.5 g/dL (ref 12.0–15.0)
Immature Granulocytes: 0 %
Lymphocytes Relative: 16 %
Lymphs Abs: 0.9 10*3/uL (ref 0.7–4.0)
MCH: 29.1 pg (ref 26.0–34.0)
MCHC: 32.7 g/dL (ref 30.0–36.0)
MCV: 89 fL (ref 80.0–100.0)
Monocytes Absolute: 0.3 10*3/uL (ref 0.1–1.0)
Monocytes Relative: 5 %
Neutro Abs: 4.4 10*3/uL (ref 1.7–7.7)
Neutrophils Relative %: 78 %
Platelet Count: 189 10*3/uL (ref 150–400)
RBC: 4.64 MIL/uL (ref 3.87–5.11)
RDW: 13.9 % (ref 11.5–15.5)
WBC Count: 5.6 10*3/uL (ref 4.0–10.5)
nRBC: 0 % (ref 0.0–0.2)

## 2020-06-13 LAB — CMP (CANCER CENTER ONLY)
ALT: 33 U/L (ref 0–44)
AST: 34 U/L (ref 15–41)
Albumin: 3.7 g/dL (ref 3.5–5.0)
Alkaline Phosphatase: 104 U/L (ref 38–126)
Anion gap: 8 (ref 5–15)
BUN: 10 mg/dL (ref 6–20)
CO2: 26 mmol/L (ref 22–32)
Calcium: 9.8 mg/dL (ref 8.9–10.3)
Chloride: 101 mmol/L (ref 98–111)
Creatinine: 0.97 mg/dL (ref 0.44–1.00)
GFR, Est AFR Am: >60 mL/min (ref >60)
GFR, Estimated: >60 mL/min (ref >60)
Glucose, Bld: 86 mg/dL (ref 70–99)
Potassium: 4.1 mmol/L (ref 3.5–5.1)
Sodium: 135 mmol/L (ref 135–145)
Total Bilirubin: 0.5 mg/dL (ref 0.3–1.2)
Total Protein: 7.4 g/dL (ref 6.5–8.1)

## 2020-06-13 MED ORDER — SODIUM CHLORIDE (PF) 0.9 % IJ SOLN
INTRAMUSCULAR | Status: AC
  Filled 2020-06-13: qty 50

## 2020-06-13 MED ORDER — IOHEXOL 300 MG/ML  SOLN
100.0000 mL | Freq: Once | INTRAMUSCULAR | Status: AC | PRN
  Administered 2020-06-13: 100 mL via INTRAVENOUS

## 2020-06-15 ENCOUNTER — Inpatient Hospital Stay (HOSPITAL_BASED_OUTPATIENT_CLINIC_OR_DEPARTMENT_OTHER): Payer: 59 | Admitting: Internal Medicine

## 2020-06-15 ENCOUNTER — Encounter: Payer: Self-pay | Admitting: Internal Medicine

## 2020-06-15 ENCOUNTER — Telehealth: Payer: Self-pay | Admitting: Internal Medicine

## 2020-06-15 ENCOUNTER — Other Ambulatory Visit: Payer: Self-pay

## 2020-06-15 ENCOUNTER — Other Ambulatory Visit: Payer: Self-pay | Admitting: Internal Medicine

## 2020-06-15 VITALS — BP 160/100 | HR 60 | Temp 97.9°F | Resp 20 | Wt 176.5 lb

## 2020-06-15 DIAGNOSIS — C7951 Secondary malignant neoplasm of bone: Secondary | ICD-10-CM | POA: Diagnosis present

## 2020-06-15 DIAGNOSIS — Z5111 Encounter for antineoplastic chemotherapy: Secondary | ICD-10-CM | POA: Diagnosis not present

## 2020-06-15 DIAGNOSIS — Z7901 Long term (current) use of anticoagulants: Secondary | ICD-10-CM | POA: Diagnosis not present

## 2020-06-15 DIAGNOSIS — C3491 Malignant neoplasm of unspecified part of right bronchus or lung: Secondary | ICD-10-CM | POA: Diagnosis not present

## 2020-06-15 DIAGNOSIS — C7931 Secondary malignant neoplasm of brain: Secondary | ICD-10-CM | POA: Diagnosis not present

## 2020-06-15 DIAGNOSIS — I10 Essential (primary) hypertension: Secondary | ICD-10-CM | POA: Diagnosis not present

## 2020-06-15 DIAGNOSIS — C787 Secondary malignant neoplasm of liver and intrahepatic bile duct: Secondary | ICD-10-CM | POA: Diagnosis present

## 2020-06-15 DIAGNOSIS — Z79899 Other long term (current) drug therapy: Secondary | ICD-10-CM | POA: Diagnosis not present

## 2020-06-15 MED ORDER — OXYCODONE HCL 5 MG PO TABS: 5.0000 mg | ORAL_TABLET | Freq: Three times a day (TID) | ORAL | 0 refills | Status: DC | PRN

## 2020-06-15 NOTE — Progress Notes (Signed)
[place] [phone]   Fax:([phone]) [phone]  OFFICE PROGRESS NOTE  Patient, No Pcp Per No address on file  DIAGNOSIS: Stage IV (T3, N2, M1c) non-small cell lung cancer, adenocarcinoma with positive EGFR mutation with deletion in exon 19 and presented with large infrahilar mass in addition to mediastinal lymphadenopathy as well as metastatic disease to the liver and bone with suspicious brain metastasis that significantly improved after the patient started treatment with Tagrisso and was not evident on the repeat MRI of the brain few days ago.  This was diagnosed in May 2021.  PRIOR THERAPY: None  CURRENT THERAPY: Tagrisso 80 mg p.o. daily.  First dose started Apr 21, 2020.  Status post 2 months of treatment.  INTERVAL HISTORY: Brittany Mccarty 59 y.o. female returns to the clinic today for follow-up visit accompanied by her husband.  The patient is feeling fine today with no concerning complaints except for intermittent pain from the back and upper shoulder.  She takes Percocet few times a day with relief of her symptoms.  She is concerned about the Tylenol component of the Percocet and would like to switch to oxycodone.  She denied having any current shortness of breath, cough or hemoptysis.  She denied having any fever or chills.  She has no nausea, vomiting, diarrhea or constipation.  She has no headache or visual changes.  She has no significant skin rash.  She continues to tolerate her treatment with Tagrisso fairly well.  She had repeat CT scan of the chest, abdomen and pelvis performed recently and she is here for evaluation and discussion of her risk her results.  MEDICAL HISTORY: Past Medical History:  Diagnosis Date  . NSCL CA DX'D 04/07/20    ALLERGIES:  is allergic to other and cefdinir.  MEDICATIONS:  Current Outpatient Medications  Medication Sig Dispense Refill  . benzonatate (TESSALON) 100 MG capsule Take 100 mg by mouth 3 (three) times  daily as needed for cough.     Marland Kitchen ELIQUIS 5 MG TABS tablet Take 5 mg by mouth 2 (two) times daily.     . famotidine (PEPCID) 40 MG tablet Take 40 mg by mouth 2 (two) times daily.    . folic acid (FOLVITE) 1 MG tablet Take 1 mg by mouth daily. (Patient not taking: Reported on 05/16/2020)    . ondansetron (ZOFRAN) 4 MG tablet Take 4 mg by mouth every 8 (eight) hours as needed for nausea or vomiting.     Marland Kitchen osimertinib mesylate (TAGRISSO) 80 MG tablet Take 1 tablet (80 mg total) by mouth daily. 30 tablet 2  . oxyCODONE-acetaminophen (PERCOCET/ROXICET) 5-325 MG tablet Take 1 tablet by mouth every 8 (eight) hours as needed for severe pain. 30 tablet 0   No current facility-administered medications for this visit.    SURGICAL HISTORY:  Past Surgical History:  Procedure Laterality Date  . IR REMOVAL OF PLURAL CATH W/CUFF  04/24/2020    REVIEW OF SYSTEMS:  Constitutional: negative Eyes: negative Ears, nose, mouth, throat, and face: negative Respiratory: positive for pleurisy/chest pain Cardiovascular: negative Gastrointestinal: negative Genitourinary:negative Integument/breast: negative Hematologic/lymphatic: negative Musculoskeletal:positive for back pain Neurological: negative Behavioral/Psych: negative Endocrine: negative Allergic/Immunologic: negative   PHYSICAL EXAMINATION: General appearance: alert, cooperative and no distress Head: Normocephalic, without obvious abnormality, atraumatic Neck: no adenopathy, no JVD, supple, symmetrical, trachea midline and thyroid not enlarged, symmetric, no tenderness/mass/nodules Lymph nodes: Cervical, supraclavicular, and axillary nodes normal. Resp: clear to auscultation bilaterally Back: symmetric, no curvature. ROM  normal. No CVA tenderness. Cardio: regular rate and rhythm, S1, S2 normal, no murmur, click, rub or gallop GI: soft, non-tender; bowel sounds normal; no masses,  no organomegaly Extremities: extremities normal, atraumatic, no  cyanosis or edema Neurologic: Alert and oriented X 3, normal strength and tone. Normal symmetric reflexes. Normal coordination and gait  ECOG PERFORMANCE STATUS: 1 - Symptomatic but completely ambulatory  Blood pressure (!) 160/100, pulse 60, temperature 97.9 F (36.6 C), temperature source Temporal, resp. rate 20, weight 176 lb 8 oz (80.1 kg), SpO2 100 %.  LABORATORY DATA: Lab Results  Component Value Date   WBC 5.6 06/13/2020   HGB 13.5 06/13/2020   HCT 41.3 06/13/2020   MCV 89.0 06/13/2020   PLT 189 06/13/2020      Chemistry      Component Value Date/Time   NA 135 06/13/2020 1136   K 4.1 06/13/2020 1136   CL 101 06/13/2020 1136   CO2 26 06/13/2020 1136   BUN 10 06/13/2020 1136   CREATININE 0.97 06/13/2020 1136      Component Value Date/Time   CALCIUM 9.8 06/13/2020 1136   ALKPHOS 104 06/13/2020 1136   AST 34 06/13/2020 1136   ALT 33 06/13/2020 1136   BILITOT 0.5 06/13/2020 1136       RADIOGRAPHIC STUDIES: CT Chest W Contrast  Result Date: 06/13/2020 CLINICAL DATA:  59 year old female with history of non-small cell lung cancer. Oral chemotherapy ongoing. Staging examination. EXAM: CT CHEST, ABDOMEN, AND PELVIS WITH CONTRAST TECHNIQUE: Multidetector CT imaging of the chest, abdomen and pelvis was performed following the standard protocol during bolus administration of intravenous contrast. CONTRAST:  120m OMNIPAQUE IOHEXOL 300 MG/ML  SOLN COMPARISON:  CT the chest, abdomen and pelvis 04/24/2020. FINDINGS: CT CHEST FINDINGS Cardiovascular: Heart size is normal. There is no significant pericardial fluid, thickening or pericardial calcification. Aortic atherosclerosis. No definite coronary artery calcifications. Mediastinum/Nodes: Enlarged right para-aortic lymph node measuring 2.4 cm in short axis demonstrating central low attenuation and some coarse calcifications. Previously noted subcarinal lymphadenopathy has resolved. No new mediastinal or hilar lymphadenopathy otherwise  noted. Esophagus is unremarkable in appearance. No axillary lymphadenopathy. Lungs/Pleura: When compared to the prior study the areas of ground-glass attenuation, thickening of the peribronchovascular interstitium and nodular septal thickening in the right lung have significantly improved. Several residual areas of nodularity are noted in the right lung, largest of which measures up to 6 mm in the inferior aspect of the right upper lobe (axial image 69 of series 6). Compared to the prior examination there has been ground-glass attenuation nodule in the left upper lobe (axial image 50 of series 6) measuring 1.5 x 0.9 cm, decreased in conspicuity and size compared to the prior study. Several other tiny calcified granulomas are noted. The right lower lobe is now nearly completely collapsed with some mild cylindrical bronchiectasis and minimal aeration in the right lower lobe, predominantly in the superior segment. Small right pleural effusion with some pleural thickening and enhancement. Musculoskeletal: Multiple sclerotic lesions appear more conspicuous than the prior examination, concerning for widespread metastatic disease to the bones. The largest of these measures up to 9 mm in the right-side of the T7 vertebral body posteriorly (axial image 68 of series 6). There is also a mixed lytic and sclerotic lesion in the right-side of the manubrium (axial image 47 of series 6) measuring 2.2 x 0.9 cm. CT ABDOMEN PELVIS FINDINGS Hepatobiliary: No suspicious cystic or solid hepatic lesions. Tiny calcified granuloma in segments 4A and 6. Hypovascular area adjacent  to the falciform ligament in segment 4B, presumably a benign perfusion anomaly. No intra or extrahepatic biliary ductal dilatation. Gallbladder is normal in appearance. Pancreas: No pancreatic mass. No pancreatic ductal dilatation. No pancreatic or peripancreatic fluid collections or inflammatory changes. Spleen: Ill-defined hypovascular lesion measuring 1.1 cm in  the lateral aspect of the spleen (axial image 54 of series 2), incompletely characterized on today's examination, but similar to the prior study. Adrenals/Urinary Tract: Small focus of cortical thinning in the posterior aspect of the lower pole the left kidney, presumably a focus of post infectious or inflammatory scarring. Right kidney and bilateral adrenal glands are normal in appearance. No hydroureteronephrosis. Urinary bladder is normal in appearance. Stomach/Bowel: Normal appearance of the stomach. No pathologic dilatation of small bowel or colon. The appendix is not confidently identified and may be surgically absent. Regardless, there are no inflammatory changes noted adjacent to the cecum to suggest the presence of an acute appendicitis at this time. Vascular/Lymphatic: No significant atherosclerotic disease, aneurysm or dissection noted in the abdominal or pelvic vasculature. No lymphadenopathy noted in the abdomen or pelvis. Reproductive: Uterus and ovaries are unremarkable in appearance. Other: No significant volume of ascites.  No pneumoperitoneum. Musculoskeletal: Increased conspicuity of numerous sclerotic lesions throughout the visualized axial and appendicular skeleton, largest of which is in the L1 vertebral body measuring 3.8 x 3.3 cm (axial image 61 of series 2). IMPRESSION: 1. Today's study demonstrates a positive response to therapy in terms of regression of changes in the right lung which were suggestive of lymphangitic spread of tumor on the prior examination. There continues to be pulmonary nodules in the lungs bilaterally, as detailed above. In addition, there is a thick-walled enhancing small right-sided pleural effusion which could be malignant. Previously noted subcarinal lymphadenopathy has regressed. Large necrotic appearing right paratracheal lymph node which is partially calcified is similar to the prior examination. 2. Increasing conspicuity of numerous sclerotic lesions noted  throughout the visualized axial and appendicular skeleton, which likely reflects some healing bone metastases. 3. No definitive signs of metastatic disease in the abdomen or pelvis. 4. Aortic atherosclerosis. 5. Additional incidental findings, as above. Electronically Signed   By: Vinnie Langton M.D.   On: 06/13/2020 14:46   CT Abdomen Pelvis W Contrast  Result Date: 06/13/2020 CLINICAL DATA:  59 year old female with history of non-small cell lung cancer. Oral chemotherapy ongoing. Staging examination. EXAM: CT CHEST, ABDOMEN, AND PELVIS WITH CONTRAST TECHNIQUE: Multidetector CT imaging of the chest, abdomen and pelvis was performed following the standard protocol during bolus administration of intravenous contrast. CONTRAST:  159m OMNIPAQUE IOHEXOL 300 MG/ML  SOLN COMPARISON:  CT the chest, abdomen and pelvis 04/24/2020. FINDINGS: CT CHEST FINDINGS Cardiovascular: Heart size is normal. There is no significant pericardial fluid, thickening or pericardial calcification. Aortic atherosclerosis. No definite coronary artery calcifications. Mediastinum/Nodes: Enlarged right para-aortic lymph node measuring 2.4 cm in short axis demonstrating central low attenuation and some coarse calcifications. Previously noted subcarinal lymphadenopathy has resolved. No new mediastinal or hilar lymphadenopathy otherwise noted. Esophagus is unremarkable in appearance. No axillary lymphadenopathy. Lungs/Pleura: When compared to the prior study the areas of ground-glass attenuation, thickening of the peribronchovascular interstitium and nodular septal thickening in the right lung have significantly improved. Several residual areas of nodularity are noted in the right lung, largest of which measures up to 6 mm in the inferior aspect of the right upper lobe (axial image 69 of series 6). Compared to the prior examination there has been ground-glass attenuation nodule in the left  upper lobe (axial image 50 of series 6) measuring 1.5 x  0.9 cm, decreased in conspicuity and size compared to the prior study. Several other tiny calcified granulomas are noted. The right lower lobe is now nearly completely collapsed with some mild cylindrical bronchiectasis and minimal aeration in the right lower lobe, predominantly in the superior segment. Small right pleural effusion with some pleural thickening and enhancement. Musculoskeletal: Multiple sclerotic lesions appear more conspicuous than the prior examination, concerning for widespread metastatic disease to the bones. The largest of these measures up to 9 mm in the right-side of the T7 vertebral body posteriorly (axial image 68 of series 6). There is also a mixed lytic and sclerotic lesion in the right-side of the manubrium (axial image 47 of series 6) measuring 2.2 x 0.9 cm. CT ABDOMEN PELVIS FINDINGS Hepatobiliary: No suspicious cystic or solid hepatic lesions. Tiny calcified granuloma in segments 4A and 6. Hypovascular area adjacent to the falciform ligament in segment 4B, presumably a benign perfusion anomaly. No intra or extrahepatic biliary ductal dilatation. Gallbladder is normal in appearance. Pancreas: No pancreatic mass. No pancreatic ductal dilatation. No pancreatic or peripancreatic fluid collections or inflammatory changes. Spleen: Ill-defined hypovascular lesion measuring 1.1 cm in the lateral aspect of the spleen (axial image 54 of series 2), incompletely characterized on today's examination, but similar to the prior study. Adrenals/Urinary Tract: Small focus of cortical thinning in the posterior aspect of the lower pole the left kidney, presumably a focus of post infectious or inflammatory scarring. Right kidney and bilateral adrenal glands are normal in appearance. No hydroureteronephrosis. Urinary bladder is normal in appearance. Stomach/Bowel: Normal appearance of the stomach. No pathologic dilatation of small bowel or colon. The appendix is not confidently identified and may be  surgically absent. Regardless, there are no inflammatory changes noted adjacent to the cecum to suggest the presence of an acute appendicitis at this time. Vascular/Lymphatic: No significant atherosclerotic disease, aneurysm or dissection noted in the abdominal or pelvic vasculature. No lymphadenopathy noted in the abdomen or pelvis. Reproductive: Uterus and ovaries are unremarkable in appearance. Other: No significant volume of ascites.  No pneumoperitoneum. Musculoskeletal: Increased conspicuity of numerous sclerotic lesions throughout the visualized axial and appendicular skeleton, largest of which is in the L1 vertebral body measuring 3.8 x 3.3 cm (axial image 61 of series 2). IMPRESSION: 1. Today's study demonstrates a positive response to therapy in terms of regression of changes in the right lung which were suggestive of lymphangitic spread of tumor on the prior examination. There continues to be pulmonary nodules in the lungs bilaterally, as detailed above. In addition, there is a thick-walled enhancing small right-sided pleural effusion which could be malignant. Previously noted subcarinal lymphadenopathy has regressed. Large necrotic appearing right paratracheal lymph node which is partially calcified is similar to the prior examination. 2. Increasing conspicuity of numerous sclerotic lesions noted throughout the visualized axial and appendicular skeleton, which likely reflects some healing bone metastases. 3. No definitive signs of metastatic disease in the abdomen or pelvis. 4. Aortic atherosclerosis. 5. Additional incidental findings, as above. Electronically Signed   By: Vinnie Langton M.D.   On: 06/13/2020 14:46    ASSESSMENT AND PLAN: Stage IV (T3, N2, M1c) non-small cell lung cancer, adenocarcinoma with positive EGFR mutation with deletion in exon 19 and presented with large infrahilar mass in addition to mediastinal lymphadenopathy as well as metastatic disease to the liver and bone with  suspicious brain metastasis that significantly improved after the patient started treatment with Tagrisso  and was not evident on the repeat MRI of the brain few days ago.  This was diagnosed in May 2021. The patient is currently undergoing treatment with Tagrisso 80 mg p.o. daily started Apr 21, 2020.  She is status post 2 months of treatment and has been tolerating her treatment with Tagrisso fairly well. The patient had repeat CT scan of the chest, abdomen and pelvis performed recently.  I personally and independently reviewed the scans and discussed the results with the patient and her husband today. Her scan showed improvement of her disease but there was increased conspicuity of numerous sclerotic lesion as part of the healing process after the treatment. I recommended for the patient to continue her current treatment with Tagrisso with the same dose. Regarding the metastatic bone disease, I discussed with the patient the option of referral to radiation oncology for consideration of palliative radiotherapy to the painful metastatic bone lesion but she declines this option at this time.  I also discussed with the patient consideration for treatment with Xgeva to decrease the risk of bone fractures.  The patient mentions that she has a lot of gum and dental issues and she would also like to hold on treatment with Xgeva for now.  She will continue with vitamin D and calcium supplements at regular basis. For the hypertension she was advised to take her blood pressure medication as prescribed and to monitor it closely at home. For pain management, I will give her prescription for oxycodone and she was advised to take Tylenol or ibuprofen as needed in addition to oxycodone if no relief. The patient will come back for follow-up visit in 1 months for evaluation and repeat blood work. She was advised to call immediately if she has any concerning symptoms in the interval. The patient voices understanding of  current disease status and treatment options and is in agreement with the current care plan.  All questions were answered. The patient knows to call the clinic with any problems, questions or concerns. We can certainly see the patient much sooner if necessary.  Disclaimer: This note was dictated with voice recognition software. Similar sounding words can inadvertently be transcribed and may not be corrected upon review.

## 2020-06-15 NOTE — Telephone Encounter (Signed)
Scheduled per 07/14 los, patient will be notified per My chart.

## 2020-06-16 NOTE — Telephone Encounter (Signed)
Brittany Mccarty already refilled 06/15/2020.  It wont let me cancel or refuse.

## 2020-06-27 ENCOUNTER — Other Ambulatory Visit: Payer: Self-pay | Admitting: Internal Medicine

## 2020-06-27 MED ORDER — OXYCODONE HCL 5 MG PO TABS: 5.0000 mg | ORAL_TABLET | Freq: Three times a day (TID) | ORAL | 0 refills | Status: DC | PRN

## 2020-06-27 NOTE — Telephone Encounter (Signed)
Patient requesting refill. 

## 2020-06-29 ENCOUNTER — Telehealth: Payer: Self-pay | Admitting: Radiation Oncology

## 2020-06-29 NOTE — Telephone Encounter (Signed)
error 

## 2020-07-07 ENCOUNTER — Other Ambulatory Visit: Payer: Self-pay | Admitting: Internal Medicine

## 2020-07-08 ENCOUNTER — Other Ambulatory Visit: Payer: Self-pay | Admitting: Internal Medicine

## 2020-07-08 MED ORDER — OXYCODONE HCL 5 MG PO TABS: 5.0000 mg | ORAL_TABLET | Freq: Three times a day (TID) | ORAL | 0 refills | Status: DC | PRN

## 2020-07-08 NOTE — Telephone Encounter (Signed)
Refill

## 2020-07-09 NOTE — Telephone Encounter (Signed)
Done. Thank you.

## 2020-07-12 ENCOUNTER — Other Ambulatory Visit: Payer: Self-pay

## 2020-07-12 ENCOUNTER — Inpatient Hospital Stay (HOSPITAL_BASED_OUTPATIENT_CLINIC_OR_DEPARTMENT_OTHER): Payer: 59 | Admitting: Internal Medicine

## 2020-07-12 ENCOUNTER — Inpatient Hospital Stay: Payer: 59 | Attending: Internal Medicine

## 2020-07-12 ENCOUNTER — Encounter: Payer: Self-pay | Admitting: Internal Medicine

## 2020-07-12 VITALS — BP 143/80 | HR 63 | Temp 96.7°F | Resp 20 | Ht 64.0 in | Wt 180.2 lb

## 2020-07-12 DIAGNOSIS — Z7901 Long term (current) use of anticoagulants: Secondary | ICD-10-CM | POA: Insufficient documentation

## 2020-07-12 DIAGNOSIS — C7951 Secondary malignant neoplasm of bone: Secondary | ICD-10-CM | POA: Insufficient documentation

## 2020-07-12 DIAGNOSIS — C787 Secondary malignant neoplasm of liver and intrahepatic bile duct: Secondary | ICD-10-CM | POA: Insufficient documentation

## 2020-07-12 DIAGNOSIS — Z5111 Encounter for antineoplastic chemotherapy: Secondary | ICD-10-CM | POA: Diagnosis not present

## 2020-07-12 DIAGNOSIS — C3491 Malignant neoplasm of unspecified part of right bronchus or lung: Secondary | ICD-10-CM | POA: Insufficient documentation

## 2020-07-12 DIAGNOSIS — Z79899 Other long term (current) drug therapy: Secondary | ICD-10-CM | POA: Diagnosis not present

## 2020-07-12 DIAGNOSIS — C7931 Secondary malignant neoplasm of brain: Secondary | ICD-10-CM

## 2020-07-12 LAB — CBC WITH DIFFERENTIAL (CANCER CENTER ONLY)
Abs Immature Granulocytes: 0.01 10*3/uL (ref 0.00–0.07)
Basophils Absolute: 0 10*3/uL (ref 0.0–0.1)
Basophils Relative: 1 %
Eosinophils Absolute: 0 10*3/uL (ref 0.0–0.5)
Eosinophils Relative: 1 %
HCT: 42.9 % (ref 36.0–46.0)
Hemoglobin: 13.9 g/dL (ref 12.0–15.0)
Immature Granulocytes: 0 %
Lymphocytes Relative: 18 %
Lymphs Abs: 1 10*3/uL (ref 0.7–4.0)
MCH: 29.2 pg (ref 26.0–34.0)
MCHC: 32.4 g/dL (ref 30.0–36.0)
MCV: 90.1 fL (ref 80.0–100.0)
Monocytes Absolute: 0.3 10*3/uL (ref 0.1–1.0)
Monocytes Relative: 6 %
Neutro Abs: 4.1 10*3/uL (ref 1.7–7.7)
Neutrophils Relative %: 74 %
Platelet Count: 182 10*3/uL (ref 150–400)
RBC: 4.76 MIL/uL (ref 3.87–5.11)
RDW: 14.2 % (ref 11.5–15.5)
WBC Count: 5.4 10*3/uL (ref 4.0–10.5)
nRBC: 0 % (ref 0.0–0.2)

## 2020-07-12 LAB — CMP (CANCER CENTER ONLY)
ALT: 34 U/L (ref 0–44)
AST: 38 U/L (ref 15–41)
Albumin: 3.8 g/dL (ref 3.5–5.0)
Alkaline Phosphatase: 94 U/L (ref 38–126)
Anion gap: 7 (ref 5–15)
BUN: 12 mg/dL (ref 6–20)
CO2: 26 mmol/L (ref 22–32)
Calcium: 10.2 mg/dL (ref 8.9–10.3)
Chloride: 104 mmol/L (ref 98–111)
Creatinine: 1.09 mg/dL — ABNORMAL HIGH (ref 0.44–1.00)
GFR, Est AFR Am: >60 mL/min (ref >60)
GFR, Estimated: 55 mL/min — ABNORMAL LOW (ref >60)
Glucose, Bld: 77 mg/dL (ref 70–99)
Potassium: 4.3 mmol/L (ref 3.5–5.1)
Sodium: 137 mmol/L (ref 135–145)
Total Bilirubin: 0.6 mg/dL (ref 0.3–1.2)
Total Protein: 7.6 g/dL (ref 6.5–8.1)

## 2020-07-12 NOTE — Progress Notes (Signed)
[place] [phone]   [phone]  OFFICE PROGRESS NOTE  Patient, No Pcp Per No address on file  DIAGNOSIS: Stage IV (T3, N2, M1c) non-small cell lung cancer, adenocarcinoma with positive EGFR mutation with deletion in exon 19 and presented with large infrahilar mass in addition to mediastinal lymphadenopathy as well as metastatic disease to the liver and bone with suspicious brain metastasis that significantly improved after the patient started treatment with Tagrisso and was not evident on the repeat MRI of the brain few days ago.  This was diagnosed in May 2021.  PRIOR THERAPY: None  CURRENT THERAPY: Tagrisso 80 mg p.o. daily.  First dose started Apr 21, 2020.  Status post 3 months of treatment.  INTERVAL HISTORY: Brittany Mccarty 59 y.o. female returns to the clinic today for follow-up visit accompanied by her daughter.  The patient is feeling fine today with no concerning complaints.  She exercises at regular basis.  She denied having any chest pain, shortness of breath, cough or hemoptysis.  She denied having any nausea, vomiting, diarrhea or constipation.  She has no headache or visual changes.  She has been tolerating her treatment with Tagrisso fairly well.  She is here today for evaluation and repeat blood work.  MEDICAL HISTORY: Past Medical History:  Diagnosis Date  . NSCL CA DX'D 04/07/20    ALLERGIES:  is allergic to other and cefdinir.  MEDICATIONS:  Current Outpatient Medications  Medication Sig Dispense Refill  . benzonatate (TESSALON) 100 MG capsule Take 100 mg by mouth 3 (three) times daily as needed for cough.     Marland Kitchen ELIQUIS 5 MG TABS tablet Take 5 mg by mouth 2 (two) times daily.     . famotidine (PEPCID) 40 MG tablet Take 40 mg by mouth 2 (two) times daily.    . folic acid (FOLVITE) 1 MG tablet Take 1 mg by mouth daily. (Patient not taking: Reported on 05/16/2020)    . ondansetron (ZOFRAN) 4 MG tablet Take 4 mg by mouth  every 8 (eight) hours as needed for nausea or vomiting.     Marland Kitchen osimertinib mesylate (TAGRISSO) 80 MG tablet Take 1 tablet (80 mg total) by mouth daily. 30 tablet 2  . oxyCODONE (OXY IR/ROXICODONE) 5 MG immediate release tablet Take 1 tablet (5 mg total) by mouth 3 (three) times daily between meals as needed for severe pain. 30 tablet 0   No current facility-administered medications for this visit.    SURGICAL HISTORY:  Past Surgical History:  Procedure Laterality Date  . IR REMOVAL OF PLURAL CATH W/CUFF  04/24/2020    REVIEW OF SYSTEMS:  A comprehensive review of systems was negative.   PHYSICAL EXAMINATION: General appearance: alert, cooperative and no distress Head: Normocephalic, without obvious abnormality, atraumatic Neck: no adenopathy, no JVD, supple, symmetrical, trachea midline and thyroid not enlarged, symmetric, no tenderness/mass/nodules Lymph nodes: Cervical, supraclavicular, and axillary nodes normal. Resp: clear to auscultation bilaterally Back: symmetric, no curvature. ROM normal. No CVA tenderness. Cardio: regular rate and rhythm, S1, S2 normal, no murmur, click, rub or gallop GI: soft, non-tender; bowel sounds normal; no masses,  no organomegaly Extremities: extremities normal, atraumatic, no cyanosis or edema  ECOG PERFORMANCE STATUS: 1 - Symptomatic but completely ambulatory  Blood pressure (!) 143/80, pulse 63, temperature (!) 96.7 F (35.9 C), temperature source Tympanic, resp. rate 20, height _0  (1.626 m), weight 180 lb 3.2 oz (81.7 kg), SpO2 100 %.  LABORATORY DATA: Lab Results  Component Value Date   WBC 5.4 07/12/2020   HGB 13.9 07/12/2020   HCT 42.9 07/12/2020   MCV 90.1 07/12/2020   PLT 182 07/12/2020      Chemistry      Component Value Date/Time   NA 135 06/13/2020 1136   K 4.1 06/13/2020 1136   CL 101 06/13/2020 1136   CO2 26 06/13/2020 1136   BUN 10 06/13/2020 1136   CREATININE 0.97 06/13/2020 1136      Component Value Date/Time    CALCIUM 9.8 06/13/2020 1136   ALKPHOS 104 06/13/2020 1136   AST 34 06/13/2020 1136   ALT 33 06/13/2020 1136   BILITOT 0.5 06/13/2020 1136       RADIOGRAPHIC STUDIES: CT Chest W Contrast  Result Date: 06/13/2020 CLINICAL DATA:  59 year old female with history of non-small cell lung cancer. Oral chemotherapy ongoing. Staging examination. EXAM: CT CHEST, ABDOMEN, AND PELVIS WITH CONTRAST TECHNIQUE: Multidetector CT imaging of the chest, abdomen and pelvis was performed following the standard protocol during bolus administration of intravenous contrast. CONTRAST:  137m OMNIPAQUE IOHEXOL 300 MG/ML  SOLN COMPARISON:  CT the chest, abdomen and pelvis 04/24/2020. FINDINGS: CT CHEST FINDINGS Cardiovascular: Heart size is normal. There is no significant pericardial fluid, thickening or pericardial calcification. Aortic atherosclerosis. No definite coronary artery calcifications. Mediastinum/Nodes: Enlarged right para-aortic lymph node measuring 2.4 cm in short axis demonstrating central low attenuation and some coarse calcifications. Previously noted subcarinal lymphadenopathy has resolved. No new mediastinal or hilar lymphadenopathy otherwise noted. Esophagus is unremarkable in appearance. No axillary lymphadenopathy. Lungs/Pleura: When compared to the prior study the areas of ground-glass attenuation, thickening of the peribronchovascular interstitium and nodular septal thickening in the right lung have significantly improved. Several residual areas of nodularity are noted in the right lung, largest of which measures up to 6 mm in the inferior aspect of the right upper lobe (axial image 69 of series 6). Compared to the prior examination there has been ground-glass attenuation nodule in the left upper lobe (axial image 50 of series 6) measuring 1.5 x 0.9 cm, decreased in conspicuity and size compared to the prior study. Several other tiny calcified granulomas are noted. The right lower lobe is now nearly  completely collapsed with some mild cylindrical bronchiectasis and minimal aeration in the right lower lobe, predominantly in the superior segment. Small right pleural effusion with some pleural thickening and enhancement. Musculoskeletal: Multiple sclerotic lesions appear more conspicuous than the prior examination, concerning for widespread metastatic disease to the bones. The largest of these measures up to 9 mm in the right-side of the T7 vertebral body posteriorly (axial image 68 of series 6). There is also a mixed lytic and sclerotic lesion in the right-side of the manubrium (axial image 47 of series 6) measuring 2.2 x 0.9 cm. CT ABDOMEN PELVIS FINDINGS Hepatobiliary: No suspicious cystic or solid hepatic lesions. Tiny calcified granuloma in segments 4A and 6. Hypovascular area adjacent to the falciform ligament in segment 4B, presumably a benign perfusion anomaly. No intra or extrahepatic biliary ductal dilatation. Gallbladder is normal in appearance. Pancreas: No pancreatic mass. No pancreatic ductal dilatation. No pancreatic or peripancreatic fluid collections or inflammatory changes. Spleen: Ill-defined hypovascular lesion measuring 1.1 cm in the lateral aspect of the spleen (axial image 54 of series 2), incompletely characterized on today's examination, but similar to the prior study. Adrenals/Urinary Tract: Small focus of cortical thinning in the posterior aspect of the lower pole the left kidney, presumably a focus of post infectious or inflammatory  scarring. Right kidney and bilateral adrenal glands are normal in appearance. No hydroureteronephrosis. Urinary bladder is normal in appearance. Stomach/Bowel: Normal appearance of the stomach. No pathologic dilatation of small bowel or colon. The appendix is not confidently identified and may be surgically absent. Regardless, there are no inflammatory changes noted adjacent to the cecum to suggest the presence of an acute appendicitis at this time.  Vascular/Lymphatic: No significant atherosclerotic disease, aneurysm or dissection noted in the abdominal or pelvic vasculature. No lymphadenopathy noted in the abdomen or pelvis. Reproductive: Uterus and ovaries are unremarkable in appearance. Other: No significant volume of ascites.  No pneumoperitoneum. Musculoskeletal: Increased conspicuity of numerous sclerotic lesions throughout the visualized axial and appendicular skeleton, largest of which is in the L1 vertebral body measuring 3.8 x 3.3 cm (axial image 61 of series 2). IMPRESSION: 1. Today's study demonstrates a positive response to therapy in terms of regression of changes in the right lung which were suggestive of lymphangitic spread of tumor on the prior examination. There continues to be pulmonary nodules in the lungs bilaterally, as detailed above. In addition, there is a thick-walled enhancing small right-sided pleural effusion which could be malignant. Previously noted subcarinal lymphadenopathy has regressed. Large necrotic appearing right paratracheal lymph node which is partially calcified is similar to the prior examination. 2. Increasing conspicuity of numerous sclerotic lesions noted throughout the visualized axial and appendicular skeleton, which likely reflects some healing bone metastases. 3. No definitive signs of metastatic disease in the abdomen or pelvis. 4. Aortic atherosclerosis. 5. Additional incidental findings, as above. Electronically Signed   By: Vinnie Langton M.D.   On: 06/13/2020 14:46   CT Abdomen Pelvis W Contrast  Result Date: 06/13/2020 CLINICAL DATA:  59 year old female with history of non-small cell lung cancer. Oral chemotherapy ongoing. Staging examination. EXAM: CT CHEST, ABDOMEN, AND PELVIS WITH CONTRAST TECHNIQUE: Multidetector CT imaging of the chest, abdomen and pelvis was performed following the standard protocol during bolus administration of intravenous contrast. CONTRAST:  166m OMNIPAQUE IOHEXOL 300  MG/ML  SOLN COMPARISON:  CT the chest, abdomen and pelvis 04/24/2020. FINDINGS: CT CHEST FINDINGS Cardiovascular: Heart size is normal. There is no significant pericardial fluid, thickening or pericardial calcification. Aortic atherosclerosis. No definite coronary artery calcifications. Mediastinum/Nodes: Enlarged right para-aortic lymph node measuring 2.4 cm in short axis demonstrating central low attenuation and some coarse calcifications. Previously noted subcarinal lymphadenopathy has resolved. No new mediastinal or hilar lymphadenopathy otherwise noted. Esophagus is unremarkable in appearance. No axillary lymphadenopathy. Lungs/Pleura: When compared to the prior study the areas of ground-glass attenuation, thickening of the peribronchovascular interstitium and nodular septal thickening in the right lung have significantly improved. Several residual areas of nodularity are noted in the right lung, largest of which measures up to 6 mm in the inferior aspect of the right upper lobe (axial image 69 of series 6). Compared to the prior examination there has been ground-glass attenuation nodule in the left upper lobe (axial image 50 of series 6) measuring 1.5 x 0.9 cm, decreased in conspicuity and size compared to the prior study. Several other tiny calcified granulomas are noted. The right lower lobe is now nearly completely collapsed with some mild cylindrical bronchiectasis and minimal aeration in the right lower lobe, predominantly in the superior segment. Small right pleural effusion with some pleural thickening and enhancement. Musculoskeletal: Multiple sclerotic lesions appear more conspicuous than the prior examination, concerning for widespread metastatic disease to the bones. The largest of these measures up to 9 mm in the right-side  of the T7 vertebral body posteriorly (axial image 68 of series 6). There is also a mixed lytic and sclerotic lesion in the right-side of the manubrium (axial image 47 of series  6) measuring 2.2 x 0.9 cm. CT ABDOMEN PELVIS FINDINGS Hepatobiliary: No suspicious cystic or solid hepatic lesions. Tiny calcified granuloma in segments 4A and 6. Hypovascular area adjacent to the falciform ligament in segment 4B, presumably a benign perfusion anomaly. No intra or extrahepatic biliary ductal dilatation. Gallbladder is normal in appearance. Pancreas: No pancreatic mass. No pancreatic ductal dilatation. No pancreatic or peripancreatic fluid collections or inflammatory changes. Spleen: Ill-defined hypovascular lesion measuring 1.1 cm in the lateral aspect of the spleen (axial image 54 of series 2), incompletely characterized on today's examination, but similar to the prior study. Adrenals/Urinary Tract: Small focus of cortical thinning in the posterior aspect of the lower pole the left kidney, presumably a focus of post infectious or inflammatory scarring. Right kidney and bilateral adrenal glands are normal in appearance. No hydroureteronephrosis. Urinary bladder is normal in appearance. Stomach/Bowel: Normal appearance of the stomach. No pathologic dilatation of small bowel or colon. The appendix is not confidently identified and may be surgically absent. Regardless, there are no inflammatory changes noted adjacent to the cecum to suggest the presence of an acute appendicitis at this time. Vascular/Lymphatic: No significant atherosclerotic disease, aneurysm or dissection noted in the abdominal or pelvic vasculature. No lymphadenopathy noted in the abdomen or pelvis. Reproductive: Uterus and ovaries are unremarkable in appearance. Other: No significant volume of ascites.  No pneumoperitoneum. Musculoskeletal: Increased conspicuity of numerous sclerotic lesions throughout the visualized axial and appendicular skeleton, largest of which is in the L1 vertebral body measuring 3.8 x 3.3 cm (axial image 61 of series 2). IMPRESSION: 1. Today's study demonstrates a positive response to therapy in terms of  regression of changes in the right lung which were suggestive of lymphangitic spread of tumor on the prior examination. There continues to be pulmonary nodules in the lungs bilaterally, as detailed above. In addition, there is a thick-walled enhancing small right-sided pleural effusion which could be malignant. Previously noted subcarinal lymphadenopathy has regressed. Large necrotic appearing right paratracheal lymph node which is partially calcified is similar to the prior examination. 2. Increasing conspicuity of numerous sclerotic lesions noted throughout the visualized axial and appendicular skeleton, which likely reflects some healing bone metastases. 3. No definitive signs of metastatic disease in the abdomen or pelvis. 4. Aortic atherosclerosis. 5. Additional incidental findings, as above. Electronically Signed   By: Vinnie Langton M.D.   On: 06/13/2020 14:46    ASSESSMENT AND PLAN: This is a very pleasant 59 years old white female with stage IV (T3, N2, M1c) non-small cell lung cancer, adenocarcinoma with positive EGFR mutation with deletion in exon 19 and presented with large infrahilar mass in addition to mediastinal lymphadenopathy as well as metastatic disease to the liver and bone with suspicious brain metastasis that significantly improved after the patient started treatment with Tagrisso and was not evident on the repeat MRI of the brain few days ago.  This was diagnosed in May 2021. The patient is currently undergoing treatment with Tagrisso 80 mg p.o. daily started Apr 21, 2020.  She is status post 3 months of treatment and has been tolerating her treatment with Tagrisso fairly well. The patient is doing fine today with no concerning complaints. I recommended for her to continue her current treatment with Tagrisso with the same dose. For the history of questionable pulmonary embolism,  she completed 3 months of treatment with Eliquis.  She will discontinue this treatment at this point and  will continue to monitor her on the upcoming imaging studies closely. For pain management, I will give her prescription for oxycodone and she was advised to take Tylenol or ibuprofen as needed in addition to oxycodone if no relief. The patient was advised to call immediately if she has any concerning symptoms in the interval. The patient voices understanding of current disease status and treatment options and is in agreement with the current care plan.  All questions were answered. The patient knows to call the clinic with any problems, questions or concerns. We can certainly see the patient much sooner if necessary.  Disclaimer: This note was dictated with voice recognition software. Similar sounding words can inadvertently be transcribed and may not be corrected upon review.

## 2020-07-13 ENCOUNTER — Telehealth: Payer: Self-pay | Admitting: Internal Medicine

## 2020-07-13 NOTE — Telephone Encounter (Signed)
Scheduled appt per 8/10 los - pt is aware of appt date and time

## 2020-07-19 ENCOUNTER — Other Ambulatory Visit: Payer: Self-pay | Admitting: Internal Medicine

## 2020-07-19 MED ORDER — OXYCODONE HCL 5 MG PO TABS: 5.0000 mg | ORAL_TABLET | Freq: Three times a day (TID) | ORAL | 0 refills | Status: DC | PRN

## 2020-07-19 NOTE — Telephone Encounter (Signed)
Sending to you for approval or refusal.  Thanks! Threasa Beards

## 2020-07-27 ENCOUNTER — Telehealth: Payer: Self-pay | Admitting: Medical Oncology

## 2020-07-27 NOTE — Telephone Encounter (Signed)
X 4 days-Symptomatic-"No energy ,fatigued, wasted,nausea, light headed, I feel like I have vertigo".  Saturday -diarrhea and vomiting.She said the diarrhea was black. This just happened one time and color is normal now.  Drank protein shake today.  She says she is not "bouncing back".

## 2020-08-01 ENCOUNTER — Other Ambulatory Visit: Payer: Self-pay | Admitting: Internal Medicine

## 2020-08-02 ENCOUNTER — Other Ambulatory Visit: Payer: Self-pay | Admitting: Internal Medicine

## 2020-08-04 ENCOUNTER — Other Ambulatory Visit: Payer: Self-pay | Admitting: Internal Medicine

## 2020-08-04 DIAGNOSIS — C3491 Malignant neoplasm of unspecified part of right bronchus or lung: Secondary | ICD-10-CM

## 2020-08-04 MED ORDER — OXYCODONE HCL 5 MG PO TABS: 5.0000 mg | ORAL_TABLET | Freq: Three times a day (TID) | ORAL | 0 refills | Status: DC | PRN

## 2020-08-04 NOTE — Telephone Encounter (Signed)
Pain med refill requested.

## 2020-08-09 ENCOUNTER — Ambulatory Visit: Payer: 59 | Admitting: Internal Medicine

## 2020-08-12 ENCOUNTER — Ambulatory Visit
Admission: RE | Admit: 2020-08-12 | Discharge: 2020-08-12 | Disposition: A | Discharge status: Home / Self Care | Payer: 59 | Source: Ambulatory Visit | Attending: Internal Medicine | Admitting: Internal Medicine

## 2020-08-12 DIAGNOSIS — C7931 Secondary malignant neoplasm of brain: Secondary | ICD-10-CM

## 2020-08-12 MED ORDER — GADOBENATE DIMEGLUMINE 529 MG/ML IV SOLN
18.0000 mL | Freq: Once | INTRAVENOUS | Status: AC | PRN
  Administered 2020-08-12: 18 mL via INTRAVENOUS

## 2020-08-15 ENCOUNTER — Telehealth: Payer: Self-pay | Admitting: Internal Medicine

## 2020-08-15 ENCOUNTER — Encounter: Payer: Self-pay | Admitting: *Deleted

## 2020-08-15 ENCOUNTER — Other Ambulatory Visit: Payer: Self-pay | Admitting: Internal Medicine

## 2020-08-15 ENCOUNTER — Other Ambulatory Visit: Payer: Self-pay

## 2020-08-15 ENCOUNTER — Inpatient Hospital Stay (HOSPITAL_BASED_OUTPATIENT_CLINIC_OR_DEPARTMENT_OTHER): Payer: 59 | Admitting: Internal Medicine

## 2020-08-15 ENCOUNTER — Inpatient Hospital Stay: Payer: 59

## 2020-08-15 ENCOUNTER — Encounter: Payer: Self-pay | Admitting: Internal Medicine

## 2020-08-15 ENCOUNTER — Inpatient Hospital Stay: Payer: 59 | Attending: Internal Medicine | Admitting: Internal Medicine

## 2020-08-15 VITALS — BP 163/109 | HR 60 | Temp 98.2°F | Resp 18 | Ht 64.0 in | Wt 180.9 lb

## 2020-08-15 DIAGNOSIS — Z86711 Personal history of pulmonary embolism: Secondary | ICD-10-CM | POA: Diagnosis not present

## 2020-08-15 DIAGNOSIS — Z7901 Long term (current) use of anticoagulants: Secondary | ICD-10-CM | POA: Diagnosis not present

## 2020-08-15 DIAGNOSIS — C3491 Malignant neoplasm of unspecified part of right bronchus or lung: Secondary | ICD-10-CM | POA: Insufficient documentation

## 2020-08-15 DIAGNOSIS — C787 Secondary malignant neoplasm of liver and intrahepatic bile duct: Secondary | ICD-10-CM | POA: Insufficient documentation

## 2020-08-15 DIAGNOSIS — Z79899 Other long term (current) drug therapy: Secondary | ICD-10-CM | POA: Insufficient documentation

## 2020-08-15 DIAGNOSIS — C7951 Secondary malignant neoplasm of bone: Secondary | ICD-10-CM | POA: Insufficient documentation

## 2020-08-15 DIAGNOSIS — Z5111 Encounter for antineoplastic chemotherapy: Secondary | ICD-10-CM

## 2020-08-15 DIAGNOSIS — C7931 Secondary malignant neoplasm of brain: Secondary | ICD-10-CM | POA: Diagnosis not present

## 2020-08-15 DIAGNOSIS — C3490 Malignant neoplasm of unspecified part of unspecified bronchus or lung: Secondary | ICD-10-CM | POA: Diagnosis not present

## 2020-08-15 LAB — CBC WITH DIFFERENTIAL (CANCER CENTER ONLY)
Abs Immature Granulocytes: 0.01 10*3/uL (ref 0.00–0.07)
Basophils Absolute: 0 10*3/uL (ref 0.0–0.1)
Basophils Relative: 1 %
Eosinophils Absolute: 0.1 10*3/uL (ref 0.0–0.5)
Eosinophils Relative: 1 %
HCT: 42.5 % (ref 36.0–46.0)
Hemoglobin: 14.1 g/dL (ref 12.0–15.0)
Immature Granulocytes: 0 %
Lymphocytes Relative: 26 %
Lymphs Abs: 1.3 10*3/uL (ref 0.7–4.0)
MCH: 29.9 pg (ref 26.0–34.0)
MCHC: 33.2 g/dL (ref 30.0–36.0)
MCV: 90 fL (ref 80.0–100.0)
Monocytes Absolute: 0.3 10*3/uL (ref 0.1–1.0)
Monocytes Relative: 6 %
Neutro Abs: 3.4 10*3/uL (ref 1.7–7.7)
Neutrophils Relative %: 66 %
Platelet Count: 157 10*3/uL (ref 150–400)
RBC: 4.72 MIL/uL (ref 3.87–5.11)
RDW: 13.6 % (ref 11.5–15.5)
WBC Count: 5.2 10*3/uL (ref 4.0–10.5)
nRBC: 0 % (ref 0.0–0.2)

## 2020-08-15 LAB — CMP (CANCER CENTER ONLY)
ALT: 48 U/L — ABNORMAL HIGH (ref 0–44)
AST: 55 U/L — ABNORMAL HIGH (ref 15–41)
Albumin: 3.9 g/dL (ref 3.5–5.0)
Alkaline Phosphatase: 83 U/L (ref 38–126)
Anion gap: 10 (ref 5–15)
BUN: 12 mg/dL (ref 6–20)
CO2: 27 mmol/L (ref 22–32)
Calcium: 9.6 mg/dL (ref 8.9–10.3)
Chloride: 100 mmol/L (ref 98–111)
Creatinine: 1.06 mg/dL — ABNORMAL HIGH (ref 0.44–1.00)
GFR, Est AFR Am: >60 mL/min (ref >60)
GFR, Estimated: 57 mL/min — ABNORMAL LOW (ref >60)
Glucose, Bld: 80 mg/dL (ref 70–99)
Potassium: 4.7 mmol/L (ref 3.5–5.1)
Sodium: 137 mmol/L (ref 135–145)
Total Bilirubin: 0.5 mg/dL (ref 0.3–1.2)
Total Protein: 7.4 g/dL (ref 6.5–8.1)

## 2020-08-15 NOTE — Progress Notes (Signed)
Oncology Nurse Navigator Documentation  Oncology Nurse Navigator Flowsheets 08/15/2020 05/04/2020  Diagnosis Status - Confirmed Diagnosis Complete  Planned Course of Treatment - Targeted Therapy  Phase of Treatment - Targeted Therapy  Targeted Therapy Actual Start Date: - 04/28/2020  Navigator Follow Up Date: 08/15/2020 -  Navigator Follow Up Reason: Follow-up Appointment -  Navigation Complete Date: 08/15/2020 -  Post Navigation: Continue to Follow Patient? No -  Reason Not Navigating Patient: Patient On Maintenance Chemotherapy -  Navigator Location [facility]  Navigator Encounter Type Clinic/[facility]C Initial MedOnc;Other:  Treatment Initiated Date - 04/28/2020  Patient Visit Type MedOnc MedOnc  Treatment Phase Treatment Treatment  Barriers/Navigation Needs No Barriers At This Time Coordination of Care  Interventions None Required Coordination of Care;Psycho-Social Support  Acuity Level 1-No Barriers Level 2-Minimal Needs (1-2 Barriers Identified)  Coordination of Care - Other  Time Spent with Patient 15 45

## 2020-08-15 NOTE — Progress Notes (Signed)
[facility] [place] [place], [place] [address] [phone]   Interval Evaluation  Date of Service: 08/15/20 Patient Name: Brittany Mccarty Patient MRN: [id number] Patient DOB: [birth year] Provider: Ventura Sellers, MD  Identifying Statement:  Brittany Mccarty is a 59 y.o. female with Brain metastases ([facility]) [C79.31]   Primary Cancer: NSCLC stage IV adenocarcinoma  Oncologic History:  Interval History:  Brittany Mccarty presents today for follow up after recent MRI brain.  She denies any new or progressive neurologic deficits. No seizures or headaches.  Continues on [place] with Dr. Julien Nordmann without issue.  H+P (05/13/20) Patient presents today to discuss abnormal brain MRI.  She underwent CNS imaging as a screening measure after diagnosis of NSCLC; this demonstrated 2 small regions possibly c/w metastases.  Lung cancer has been complicated by recurrent pleural effusions and pneumothorax, as well as subsegmental PE.  She is currently on Eliquis twice per day.  She has also started Tagrisso as of 04/20/20, ten days prior to most recent MRI.  Does describe one episode 5 years ago of brief numbness of the left arm and face which resolved on its own.  Workup was performed by a neurologist which was unremarkable.  Today she describes no neurologic deficits, no seizures, no headaches. Recently completed 3T MRI for possible SRS planning.  Medications: Current Outpatient Medications on File Prior to Visit  Medication Sig Dispense Refill  . benzonatate (TESSALON) 100 MG capsule Take 100 mg by mouth 3 (three) times daily as needed for cough.  (Patient not taking: Reported on 08/15/2020)    . famotidine (PEPCID) 40 MG tablet Take 40 mg by mouth 2 (two) times daily.    . ondansetron (ZOFRAN) 4 MG tablet Take 4 mg by mouth every 8 (eight) hours as needed for nausea or vomiting.     Marland Kitchen osimertinib mesylate (TAGRISSO) 80 MG tablet Take 1 tablet (80 mg  total) by mouth daily. 30 tablet 2  . oxyCODONE (OXY IR/ROXICODONE) 5 MG immediate release tablet Take 1 tablet (5 mg total) by mouth 3 (three) times daily between meals as needed for severe pain. 30 tablet 0   No current facility-administered medications on file prior to visit.    Allergies:  Allergies  Allergen Reactions  . Other     Blood Products  . Cefdinir Rash   Past Medical History:  Past Medical History:  Diagnosis Date  . NSCL CA DX'D 04/07/20   Past Surgical History:  Past Surgical History:  Procedure Laterality Date  . IR REMOVAL OF PLURAL CATH W/CUFF  04/24/2020   Social History:  Social History   Socioeconomic History  . Marital status: Married    Spouse name: Not on file  . Number of children: Not on file  . Years of education: Not on file  . Highest education level: Not on file  Occupational History  . Not on file  Tobacco Use  . Smoking status: Never Smoker  . Smokeless tobacco: Never Used  Substance and Sexual Activity  . Alcohol use: Not Currently  . Drug use: Never  . Sexual activity: Not on file  Other Topics Concern  . Not on file  Social History Narrative  . Not on file   Social Determinants of Health   Financial Resource Strain:   . Difficulty of Paying Living Expenses: Not on file  Food Insecurity:   . Worried About Charity fundraiser in the Last Year: Not on file  .  Ran Out of Food in the Last Year: Not on file  Transportation Needs:   . Lack of Transportation (Medical): Not on file  . Lack of Transportation (Non-Medical): Not on file  Physical Activity:   . Days of Exercise per Week: Not on file  . Minutes of Exercise per Session: Not on file  Stress:   . Feeling of Stress : Not on file  Social Connections:   . Frequency of Communication with Friends and Family: Not on file  . Frequency of Social Gatherings with Friends and Family: Not on file  . Attends Religious Services: Not on file  . Active Member of Clubs or  Organizations: Not on file  . Attends Archivist Meetings: Not on file  . Marital Status: Not on file  Intimate Partner Violence:   . Fear of Current or Ex-Partner: Not on file  . Emotionally Abused: Not on file  . Physically Abused: Not on file  . Sexually Abused: Not on file   Family History: No family history on file.  Review of Systems: Constitutional: Doesn't report fevers, chills or abnormal weight loss Eyes: Doesn't report blurriness of vision Ears, nose, mouth, throat, and face: Doesn't report sore throat Respiratory: Doesn't report cough, dyspnea or wheezes Cardiovascular: Doesn't report palpitation, chest discomfort  Gastrointestinal:  Doesn't report nausea, constipation, diarrhea GU: Doesn't report incontinence Skin: Doesn't report skin rashes Neurological: Per HPI Musculoskeletal: Doesn't report joint pain Behavioral/Psych: Doesn't report anxiety  Physical Exam:   Vitals with BMI 08/15/2020 07/12/2020 06/15/2020  Height 5\' 4"  5\' 4"  -  Weight 180 lbs 14 oz 180 lbs 3 oz 176 lbs 8 oz  BMI 31.04 57.84 69.62  Systolic 952 841 324  Diastolic 401 80 027  Pulse 60 63 60   KPS: 90. General: Alert, cooperative, pleasant, in no acute distress Head: Normal EENT: No conjunctival injection or scleral icterus.  Lungs: Resp effort normal Cardiac: Regular rate Abdomen: chest tube in place Skin: No rashes cyanosis or petechiae. Extremities: No clubbing or edema  Neurologic Exam: Mental Status: Awake, alert, attentive to examiner. Oriented to self and environment. Language is fluent with intact comprehension.  Cranial Nerves: Visual acuity is grossly normal. Visual fields are full. Extra-ocular movements intact. No ptosis. Face is symmetric Motor: Tone and bulk are normal. Power is full in both arms and legs. Reflexes are symmetric, no pathologic reflexes present.  Sensory: Intact to light touch Gait: Normal.   Labs: I have reviewed the data as listed     Component Value Date/Time   NA 137 08/15/2020 1033   K 4.7 08/15/2020 1033   CL 100 08/15/2020 1033   CO2 27 08/15/2020 1033   GLUCOSE 80 08/15/2020 1033   BUN 12 08/15/2020 1033   CREATININE 1.06 (H) 08/15/2020 1033   CALCIUM 9.6 08/15/2020 1033   PROT 7.4 08/15/2020 1033   ALBUMIN 3.9 08/15/2020 1033   AST 55 (H) 08/15/2020 1033   ALT 48 (H) 08/15/2020 1033   ALKPHOS 83 08/15/2020 1033   BILITOT 0.5 08/15/2020 1033   GFRNONAA 57 (L) 08/15/2020 1033   GFRAA >60 08/15/2020 1033   Lab Results  Component Value Date   WBC 5.2 08/15/2020   NEUTROABS 3.4 08/15/2020   HGB 14.1 08/15/2020   HCT 42.5 08/15/2020   MCV 90.0 08/15/2020   PLT 157 08/15/2020   Imaging:  [facility] Clinician Interpretation: I have personally reviewed the CNS images as listed.  My interpretation, in the context of the patient's clinical presentation,  is stable disease  MR BRAIN W WO CONTRAST  Result Date: 08/12/2020 CLINICAL DATA:  Metastatic lung cancer EXAM: MRI HEAD WITHOUT AND WITH CONTRAST TECHNIQUE: Multiplanar, multiecho pulse sequences of the brain and surrounding structures were obtained without and with intravenous contrast. CONTRAST:  84mL MULTIHANCE GADOBENATE DIMEGLUMINE 529 MG/ML IV SOLN COMPARISON:  May 2021 FINDINGS: Brain: Small focus of mild diffusion hyperintensity on the prior study has resolved. There is no acute infarction or intracranial hemorrhage. There is no intracranial mass, mass effect, or edema. There is no hydrocephalus or extra-axial fluid collection. Ventricles are stable in size. Patchy and confluent areas of T2 hyperintensity in the supratentorial white matter are nonspecific but probably reflect stable chronic microvascular ischemic changes. No abnormal enhancement. Vascular: Major vessel flow voids at the skull base are preserved. Skull and upper cervical spine: Normal marrow signal is preserved. Sinuses/Orbits: Paranasal sinuses are aerated. Orbits are unremarkable. Other: Sella is  unremarkable. Minimal patchy mastoid fluid opacification. IMPRESSION: No evidence of intracranial metastatic disease. Electronically Signed   By: Macy Mis M.D.   On: 08/12/2020 12:14    Assessment/Plan Brain metastases ([facility])   Anniemae Haberkorn is clinically and radiographically stable today.  MRI does not re-demonstrate suspected metastases.  Suspicion for metastasis for this lesion remains reasonable, given potential efficacy of Tagrisso and CNS penetrance.  We ask that Zaya Kessenich return to clinic in 9 months following next brain MRI, or sooner as needed.  We appreciate the opportunity to participate in the care of [facility].   All questions were answered. The patient knows to call the clinic with any problems, questions or concerns. No barriers to learning were detected.  I have spent a total of 30 minutes of face-to-face and non-face-to-face time, excluding clinical staff time, preparing to see patient, ordering tests and/or medications, counseling the patient, and independently interpreting results and communicating results to the patient/family/caregiver    Ventura Sellers, MD Medical Director of Neuro-Oncology [facility] 08/15/20 11:36 AM

## 2020-08-15 NOTE — Telephone Encounter (Signed)
Scheduled appointments per 9/13 los. Patient is aware of appointment dates and times.

## 2020-08-15 NOTE — Progress Notes (Signed)
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  This was diagnosed in May 2021.  PRIOR THERAPY: None  CURRENT THERAPY: Tagrisso 80 mg p.o. daily.  First dose started Apr 21, 2020.  Status post 4 oxycodone months of treatment.  INTERVAL HISTORY: Brittany Mccarty 59 y.o. female returns to the clinic today for follow-up visit accompanied by her husband.  The patient is feeling fine today with no concerning complaints except for occasional back pain.  She is currently on oxycodone for pain management and she takes 2-3 tablets every day.  She denied having any current chest pain, shortness of breath, cough or hemoptysis.  She denied having any fever or chills.  She has no nausea, vomiting, diarrhea or constipation.  She denied having any headache or visual changes.  She had repeat MRI of the brain performed recently.  She continues to tolerate her treatment with Tagrisso fairly well.  She is here today for evaluation and repeat blood work.  MEDICAL HISTORY: Past Medical History:  Diagnosis Date  . NSCL CA DX'D 04/07/20    ALLERGIES:  is allergic to other and cefdinir.  MEDICATIONS:  Current Outpatient Medications  Medication Sig Dispense Refill  . famotidine (PEPCID) 40 MG tablet Take 40 mg by mouth 2 (two) times daily.    . ondansetron (ZOFRAN) 4 MG tablet Take 4 mg by mouth every 8 (eight) hours as needed for nausea or vomiting.     Marland Kitchen osimertinib mesylate  (TAGRISSO) 80 MG tablet Take 1 tablet (80 mg total) by mouth daily. 30 tablet 2  . oxyCODONE (OXY IR/ROXICODONE) 5 MG immediate release tablet Take 1 tablet (5 mg total) by mouth 3 (three) times daily between meals as needed for severe pain. 30 tablet 0  . benzonatate (TESSALON) 100 MG capsule Take 100 mg by mouth 3 (three) times daily as needed for cough.  (Patient not taking: Reported on 08/15/2020)     No current facility-administered medications for this visit.    SURGICAL HISTORY:  Past Surgical History:  Procedure Laterality Date  . IR REMOVAL OF PLURAL CATH W/CUFF  04/24/2020    REVIEW OF SYSTEMS:  A comprehensive review of systems was negative except for: Musculoskeletal: positive for bone pain   PHYSICAL EXAMINATION: General appearance: alert, cooperative and no distress Head: Normocephalic, without obvious abnormality, atraumatic Neck: no adenopathy, no JVD, supple, symmetrical, trachea midline and thyroid not enlarged, symmetric, no tenderness/mass/nodules Lymph nodes: Cervical, supraclavicular, and axillary nodes normal. Resp: clear to auscultation bilaterally Back: symmetric, no curvature. ROM normal. No CVA tenderness. Cardio: regular rate and rhythm, S1, S2 normal, no murmur, click, rub or gallop GI: soft, non-tender; bowel sounds normal; no masses,  no organomegaly Extremities: extremities normal, atraumatic, no cyanosis or edema  ECOG PERFORMANCE STATUS: 1 - Symptomatic but completely ambulatory  Blood pressure (!) 163/109, pulse 60, temperature 98.2 F (36.8 C), resp. rate 18, height 5' 4" (1.626 m), weight 180 lb 14.4 oz (82.1 kg), SpO2 100 %.  LABORATORY  DATA: Lab Results  Component Value Date   WBC 5.2 08/15/2020   HGB 14.1 08/15/2020   HCT 42.5 08/15/2020   MCV 90.0 08/15/2020   PLT 157 08/15/2020      Chemistry      Component Value Date/Time   NA 137 07/12/2020 1003   K 4.3 07/12/2020 1003   CL 104 07/12/2020 1003   CO2 26 07/12/2020 1003   BUN 12  07/12/2020 1003   CREATININE 1.09 (H) 07/12/2020 1003      Component Value Date/Time   CALCIUM 10.2 07/12/2020 1003   ALKPHOS 94 07/12/2020 1003   AST 38 07/12/2020 1003   ALT 34 07/12/2020 1003   BILITOT 0.6 07/12/2020 1003       RADIOGRAPHIC STUDIES: MR BRAIN W WO CONTRAST  Result Date: 08/12/2020 CLINICAL DATA:  Metastatic lung cancer EXAM: MRI HEAD WITHOUT AND WITH CONTRAST TECHNIQUE: Multiplanar, multiecho pulse sequences of the brain and surrounding structures were obtained without and with intravenous contrast. CONTRAST:  43m MULTIHANCE GADOBENATE DIMEGLUMINE 529 MG/ML IV SOLN COMPARISON:  May 2021 FINDINGS: Brain: Small focus of mild diffusion hyperintensity on the prior study has resolved. There is no acute infarction or intracranial hemorrhage. There is no intracranial mass, mass effect, or edema. There is no hydrocephalus or extra-axial fluid collection. Ventricles are stable in size. Patchy and confluent areas of T2 hyperintensity in the supratentorial white matter are nonspecific but probably reflect stable chronic microvascular ischemic changes. No abnormal enhancement. Vascular: Major vessel flow voids at the skull base are preserved. Skull and upper cervical spine: Normal marrow signal is preserved. Sinuses/Orbits: Paranasal sinuses are aerated. Orbits are unremarkable. Other: Sella is unremarkable. Minimal patchy mastoid fluid opacification. IMPRESSION: No evidence of intracranial metastatic disease. Electronically Signed   By: PMacy MisM.D.   On: 08/12/2020 12:14    ASSESSMENT AND PLAN: This is a very pleasant 59years old white female with stage IV (T3, N2, M1c) non-small cell lung cancer, adenocarcinoma with positive EGFR mutation with deletion in exon 19 and presented with large infrahilar mass in addition to mediastinal lymphadenopathy as well as metastatic disease to the liver and bone with suspicious brain metastasis that significantly improved after the patient  started treatment with Tagrisso and was not evident on the repeat MRI of the brain few days ago.  This was diagnosed in May 2021. The patient is currently undergoing treatment with Tagrisso 80 mg p.o. daily started Apr 21, 2020.  She is status post 4 months of treatment and has been tolerating her treatment with Tagrisso fairly well. The patient continues to tolerate her treatment with Tagrisso fairly well. The patient had MRI of the brain performed recently that showed no concerning findings for progression and no evidence of intracranial metastatic disease. I recommended for the patient to continue her current treatment with Tagrisso with the same dose. For pain management, I will give her prescription for oxycodone and she was advised to take Tylenol or ibuprofen as needed in addition to oxycodone if no relief. I will see her back for follow-up visit in 1 months for evaluation with repeat CT scan of the chest, abdomen pelvis for restaging of her disease. She was advised to call immediately if she has any concerning symptoms in the interval. The patient voices understanding of current disease status and treatment options and is in agreement with the current care plan.  All questions were answered. The patient knows to call the clinic with any problems, questions or concerns. We can  certainly see the patient much sooner if necessary.  Disclaimer: This note was dictated with voice recognition software. Similar sounding words can inadvertently be transcribed and may not be corrected upon review.       

## 2020-08-16 ENCOUNTER — Other Ambulatory Visit: Payer: Self-pay | Admitting: Internal Medicine

## 2020-08-16 ENCOUNTER — Telehealth: Payer: Self-pay | Admitting: Internal Medicine

## 2020-08-16 MED ORDER — OXYCODONE HCL 5 MG PO TABS
5.0000 mg | ORAL_TABLET | Freq: Three times a day (TID) | ORAL | 0 refills | Status: DC | PRN
Start: 2020-08-16 — End: 2020-09-07

## 2020-08-16 NOTE — Telephone Encounter (Signed)
Scheduled per 9/13 los. Mailing pt appt calendar.

## 2020-08-16 NOTE — Telephone Encounter (Signed)
Dr. Julien Nordmann, For you approval or refusal.  Gardiner Rhyme, RN

## 2020-08-17 ENCOUNTER — Encounter: Payer: Self-pay | Admitting: Medical Oncology

## 2020-08-30 ENCOUNTER — Telehealth: Payer: Self-pay | Admitting: Internal Medicine

## 2020-08-30 NOTE — Telephone Encounter (Signed)
R/s 10/12 appt - per provider PAL. Called and spoke with patient. Confirmed appt

## 2020-09-07 ENCOUNTER — Other Ambulatory Visit: Payer: Self-pay | Admitting: Internal Medicine

## 2020-09-07 MED ORDER — OXYCODONE HCL 5 MG PO TABS
5.0000 mg | ORAL_TABLET | Freq: Three times a day (TID) | ORAL | 0 refills | Status: DC | PRN
Start: 2020-09-07 — End: 2020-09-27

## 2020-09-07 NOTE — Telephone Encounter (Signed)
Please see refill below

## 2020-09-12 ENCOUNTER — Other Ambulatory Visit: Payer: 59

## 2020-09-12 ENCOUNTER — Inpatient Hospital Stay: Payer: 59 | Attending: Internal Medicine

## 2020-09-12 ENCOUNTER — Other Ambulatory Visit: Payer: Self-pay

## 2020-09-12 ENCOUNTER — Ambulatory Visit (HOSPITAL_COMMUNITY)
Admission: RE | Admit: 2020-09-12 | Discharge: 2020-09-12 | Disposition: A | Discharge status: Home / Self Care | Payer: 59 | Source: Ambulatory Visit | Attending: Internal Medicine | Admitting: Internal Medicine

## 2020-09-12 DIAGNOSIS — C3491 Malignant neoplasm of unspecified part of right bronchus or lung: Secondary | ICD-10-CM | POA: Insufficient documentation

## 2020-09-12 DIAGNOSIS — Z9221 Personal history of antineoplastic chemotherapy: Secondary | ICD-10-CM | POA: Insufficient documentation

## 2020-09-12 DIAGNOSIS — C3490 Malignant neoplasm of unspecified part of unspecified bronchus or lung: Secondary | ICD-10-CM | POA: Insufficient documentation

## 2020-09-12 DIAGNOSIS — R0609 Other forms of dyspnea: Secondary | ICD-10-CM | POA: Insufficient documentation

## 2020-09-12 DIAGNOSIS — C787 Secondary malignant neoplasm of liver and intrahepatic bile duct: Secondary | ICD-10-CM | POA: Insufficient documentation

## 2020-09-12 DIAGNOSIS — Z79899 Other long term (current) drug therapy: Secondary | ICD-10-CM | POA: Insufficient documentation

## 2020-09-12 DIAGNOSIS — C7931 Secondary malignant neoplasm of brain: Secondary | ICD-10-CM | POA: Insufficient documentation

## 2020-09-12 DIAGNOSIS — C7951 Secondary malignant neoplasm of bone: Secondary | ICD-10-CM | POA: Insufficient documentation

## 2020-09-12 LAB — CBC WITH DIFFERENTIAL (CANCER CENTER ONLY)
Abs Immature Granulocytes: 0.01 10*3/uL (ref 0.00–0.07)
Basophils Absolute: 0 10*3/uL (ref 0.0–0.1)
Basophils Relative: 0 %
Eosinophils Absolute: 0.1 10*3/uL (ref 0.0–0.5)
Eosinophils Relative: 1 %
HCT: 42.2 % (ref 36.0–46.0)
Hemoglobin: 13.8 g/dL (ref 12.0–15.0)
Immature Granulocytes: 0 %
Lymphocytes Relative: 10 %
Lymphs Abs: 0.7 10*3/uL (ref 0.7–4.0)
MCH: 29.3 pg (ref 26.0–34.0)
MCHC: 32.7 g/dL (ref 30.0–36.0)
MCV: 89.6 fL (ref 80.0–100.0)
Monocytes Absolute: 0.4 10*3/uL (ref 0.1–1.0)
Monocytes Relative: 6 %
Neutro Abs: 5.9 10*3/uL (ref 1.7–7.7)
Neutrophils Relative %: 83 %
Platelet Count: 163 10*3/uL (ref 150–400)
RBC: 4.71 MIL/uL (ref 3.87–5.11)
RDW: 13.5 % (ref 11.5–15.5)
WBC Count: 7.1 10*3/uL (ref 4.0–10.5)
nRBC: 0 % (ref 0.0–0.2)

## 2020-09-12 LAB — CMP (CANCER CENTER ONLY)
ALT: 24 U/L (ref 0–44)
AST: 32 U/L (ref 15–41)
Albumin: 3.8 g/dL (ref 3.5–5.0)
Alkaline Phosphatase: 70 U/L (ref 38–126)
Anion gap: 5 (ref 5–15)
BUN: 14 mg/dL (ref 6–20)
CO2: 29 mmol/L (ref 22–32)
Calcium: 9.6 mg/dL (ref 8.9–10.3)
Chloride: 101 mmol/L (ref 98–111)
Creatinine: 1.14 mg/dL — ABNORMAL HIGH (ref 0.44–1.00)
GFR, Estimated: 52 mL/min — ABNORMAL LOW
Glucose, Bld: 82 mg/dL (ref 70–99)
Potassium: 4.2 mmol/L (ref 3.5–5.1)
Sodium: 135 mmol/L (ref 135–145)
Total Bilirubin: 0.4 mg/dL (ref 0.3–1.2)
Total Protein: 7.2 g/dL (ref 6.5–8.1)

## 2020-09-12 MED ORDER — IOHEXOL 300 MG/ML  SOLN
100.0000 mL | Freq: Once | INTRAMUSCULAR | Status: AC | PRN
  Administered 2020-09-12: 100 mL via INTRAVENOUS

## 2020-09-13 ENCOUNTER — Encounter: Payer: Self-pay | Admitting: Medical Oncology

## 2020-09-13 ENCOUNTER — Ambulatory Visit: Payer: 59 | Admitting: Internal Medicine

## 2020-09-14 ENCOUNTER — Encounter: Payer: Self-pay | Admitting: Medical Oncology

## 2020-09-14 ENCOUNTER — Telehealth: Payer: Self-pay | Admitting: Medical Oncology

## 2020-09-14 NOTE — Telephone Encounter (Signed)
Faxed records 57 pages to [facility] for insurance issue.

## 2020-09-14 NOTE — Progress Notes (Signed)
[facility] OFFICE PROGRESS NOTE  Patient, No Pcp Per No address on file  DIAGNOSIS: Stage IV (T3, N2, M1c)non-small cell lung cancer, adenocarcinoma with positive EGFR mutation with deletion in exon 19. She presented with large infrahilar mass in addition to mediastinal lymphadenopathy as well as metastatic disease to the liver and bone with suspicious brain metastasis that significantly improved after the patient started treatment with Tagrisso and was not evident on the repeat MRI of the brain. She was diagnosed in May 2021.  PRIOR THERAPY: None  CURRENT THERAPY: Tagrisso 80 mg p.o. daily.  First dose started Apr 21, 2020.  Status post 5 months of treatment.  INTERVAL HISTORY: Brittany Mccarty 59 y.o. female returns to the clinic for a follow up visit accompanied by her husband. The patient is feeling well today without any concerning complaints except for low back pain for which she takes oxycodone 2-3x per day. She also reports that her sternal area is sore, particularly with coughing or taking deep breaths. This has been occurring for several weeks. She states oxycodone helps relieve this discomfort as well. The patient continues to tolerate treatment with targeted oral chemotherapy with Tagrisso well without any adverse side effects. Denies any fever, chills, night sweats, or weight loss. Denies any cough or hemoptysis. She reports some dyspnea on exertion. Denies any nausea, vomiting, diarrhea, or constipation. Denies any headaches. She states she is overdue for her eye exam and states her vision is not as clear. She is followed by neuro-oncologist, Dr. Mickeal Skinner, for her history of brain metastasis.  Denies any rashes or skin changes. The patient recently had a restaging CT scan performed. The patient is here today for evaluation and to review her scan results.   MEDICAL HISTORY: Past Medical History:  Diagnosis Date  . NSCL CA DX'D 04/07/20    ALLERGIES:  is allergic to  other and cefdinir.  MEDICATIONS:  Current Outpatient Medications  Medication Sig Dispense Refill  . osimertinib mesylate (TAGRISSO) 80 MG tablet Take 1 tablet (80 mg total) by mouth daily. 30 tablet 2  . oxyCODONE (OXY IR/ROXICODONE) 5 MG immediate release tablet Take 1 tablet (5 mg total) by mouth 3 (three) times daily between meals as needed for severe pain. 45 tablet 0  . benzonatate (TESSALON) 100 MG capsule Take 100 mg by mouth 3 (three) times daily as needed for cough.  (Patient not taking: Reported on 08/15/2020)    . famotidine (PEPCID) 40 MG tablet Take 40 mg by mouth 2 (two) times daily. (Patient not taking: Reported on 09/15/2020)    . ondansetron (ZOFRAN) 4 MG tablet Take 4 mg by mouth every 8 (eight) hours as needed for nausea or vomiting.  (Patient not taking: Reported on 09/15/2020)     No current facility-administered medications for this visit.    SURGICAL HISTORY:  Past Surgical History:  Procedure Laterality Date  . IR REMOVAL OF PLURAL CATH W/CUFF  04/24/2020    REVIEW OF SYSTEMS:   Review of Systems  Constitutional: Negative for appetite change, chills, fatigue, fever and unexpected weight change.  HENT: Negative for mouth sores, nosebleeds, sore throat and trouble swallowing.   Eyes: Negative for eye problems and icterus.  Respiratory: Negative for cough, hemoptysis, shortness of breath and wheezing.   Cardiovascular: Positive for chest soreness. Negative for leg swelling.  Gastrointestinal: Negative for abdominal pain, constipation, diarrhea, nausea and vomiting.  Genitourinary: Negative for bladder incontinence, difficulty urinating, dysuria, frequency and hematuria.   Musculoskeletal: Negative for back  pain, gait problem, neck pain and neck stiffness.  Skin: Negative for itching and rash.  Neurological: Negative for dizziness, extremity weakness, gait problem, headaches, light-headedness and seizures.  Hematological: Negative for adenopathy. Does not  bruise/bleed easily.  Psychiatric/Behavioral: Negative for confusion, depression and sleep disturbance. The patient is not nervous/anxious.     PHYSICAL EXAMINATION:  Blood pressure (!) 163/108, pulse 63, temperature 97.8 F (36.6 C), resp. rate 18, height $RemoveBe'5\' 4"'[id number]$  (1.626 m), weight 182 lb 8 oz (82.8 kg), SpO2 100 %.  ECOG PERFORMANCE STATUS: 1 - Symptomatic but completely ambulatory  Physical Exam  Constitutional: Oriented to person, place, and time and well-developed, well-nourished, and in no distress. HENT:  Head: Normocephalic and atraumatic.  Mouth/Throat: Oropharynx is clear and moist. No oropharyngeal exudate.  Eyes: Conjunctivae are normal. Right eye exhibits no discharge. Left eye exhibits no discharge. No scleral icterus.  Neck: Normal range of motion. Neck supple.  Cardiovascular: Normal rate, regular rhythm, normal heart sounds and intact distal pulses.   Pulmonary/Chest: Effort normal and breath sounds normal. No respiratory distress. No wheezes. No rales.  Abdominal: Soft. Bowel sounds are normal. Exhibits no distension and no mass. There is no tenderness.  Musculoskeletal: Normal range of motion. Exhibits no edema.  Lymphadenopathy:    No cervical adenopathy.  Neurological: Alert and oriented to person, place, and time. Exhibits normal muscle tone. Gait normal. Coordination normal.  Skin: Skin is warm and dry. No rash noted. Not diaphoretic. No erythema. No pallor.  Psychiatric: Mood, memory and judgment normal.  Vitals reviewed.  LABORATORY DATA: Lab Results  Component Value Date   WBC 7.1 09/12/2020   HGB 13.8 09/12/2020   HCT 42.2 09/12/2020   MCV 89.6 09/12/2020   PLT 163 09/12/2020      Chemistry      Component Value Date/Time   NA 135 09/12/2020 1342   K 4.2 09/12/2020 1342   CL 101 09/12/2020 1342   CO2 29 09/12/2020 1342   BUN 14 09/12/2020 1342   CREATININE 1.14 (H) 09/12/2020 1342      Component Value Date/Time   CALCIUM 9.6 09/12/2020 1342    ALKPHOS 70 09/12/2020 1342   AST 32 09/12/2020 1342   ALT 24 09/12/2020 1342   BILITOT 0.4 09/12/2020 1342       RADIOGRAPHIC STUDIES:  CT Chest W Contrast  Result Date: 09/12/2020 CLINICAL DATA:  Staging of non-small cell lung cancer EXAM: CT CHEST, ABDOMEN, AND PELVIS WITH CONTRAST TECHNIQUE: Multidetector CT imaging of the chest, abdomen and pelvis was performed following the standard protocol during bolus administration of intravenous contrast. CONTRAST:  178mL OMNIPAQUE IOHEXOL 300 MG/ML  SOLN COMPARISON:  Multiple exams, including 06/13/2020 FINDINGS: CT CHEST FINDINGS Cardiovascular: Mild cardiomegaly. Mediastinum/Nodes: Partially calcified right paratracheal lymph node 2.5 cm in short axis on image 22 of series 2, previously the same. Lungs/Pleura: Stable right apical pleuroparenchymal scarring. Stable punctate calcified granuloma in the right middle lobe anteriorly on image 100 series 6. Stable volume loss in the right lower lobe with sparing of the superior segment. Stable bandlike density in the left upper lobe. Stable calcified granulomas in the left upper lobe. Mildly increased lingular atelectasis. Musculoskeletal: Thoracic spondylosis. Similar sclerosis associated with the right sternal manubrial lesion. Stable 8 mm sclerotic lesion in the right T7 vertebral body. CT ABDOMEN PELVIS FINDINGS Hepatobiliary: No significant abnormality. The gallbladder appears normal. Pancreas: Unremarkable Spleen: Punctate calcifications compatible with old granulomatous disease. Stable nonspecific 1.2 by 1.1 cm hypodense lesion in the spleen on  image 57 of series 2. Adrenals/Urinary Tract: Scarring in the left mid to lower kidney. Adrenal glands unremarkable. Stomach/Bowel: Unremarkable Vascular/Lymphatic: Unremarkable Reproductive: Lobularity along the posterior uterine margin likely from fibroid. Adnexa unremarkable. Other: No supplemental non-categorized findings. Musculoskeletal: Mildly reduced  sclerosis of the 5 mm sclerotic lesion in the right iliac bone on image 103/2. Faintly reduced sclerosis of the lesions along the L1 and L3 vertebral bodies. Lower lumbar degenerative facet arthropathy with grade 1 anterolisthesis at L5-S1, and bilateral foraminal impingement at L4-5 (left greater than right). IMPRESSION: 1. Mildly reduced sclerosis associated with some of the scattered osseous metastatic lesions. 2. Stable partially calcified right paratracheal lymph node. 3. Stable volume loss in the right lower lobe with sparing of the superior segment. 4. Other imaging findings of potential clinical significance: Mild cardiomegaly. Scarring in the left mid to lower kidney. Uterine fibroid. Lower lumbar degenerative facet arthropathy with grade 1 anterolisthesis at L5-S1 and bilateral foraminal impingement at L4-5 (left greater than right). Electronically Signed   By: Van Clines M.D.   On: 09/12/2020 16:14   CT Abdomen Pelvis W Contrast  Result Date: 09/12/2020 CLINICAL DATA:  Staging of non-small cell lung cancer EXAM: CT CHEST, ABDOMEN, AND PELVIS WITH CONTRAST TECHNIQUE: Multidetector CT imaging of the chest, abdomen and pelvis was performed following the standard protocol during bolus administration of intravenous contrast. CONTRAST:  160mL OMNIPAQUE IOHEXOL 300 MG/ML  SOLN COMPARISON:  Multiple exams, including 06/13/2020 FINDINGS: CT CHEST FINDINGS Cardiovascular: Mild cardiomegaly. Mediastinum/Nodes: Partially calcified right paratracheal lymph node 2.5 cm in short axis on image 22 of series 2, previously the same. Lungs/Pleura: Stable right apical pleuroparenchymal scarring. Stable punctate calcified granuloma in the right middle lobe anteriorly on image 100 series 6. Stable volume loss in the right lower lobe with sparing of the superior segment. Stable bandlike density in the left upper lobe. Stable calcified granulomas in the left upper lobe. Mildly increased lingular atelectasis.  Musculoskeletal: Thoracic spondylosis. Similar sclerosis associated with the right sternal manubrial lesion. Stable 8 mm sclerotic lesion in the right T7 vertebral body. CT ABDOMEN PELVIS FINDINGS Hepatobiliary: No significant abnormality. The gallbladder appears normal. Pancreas: Unremarkable Spleen: Punctate calcifications compatible with old granulomatous disease. Stable nonspecific 1.2 by 1.1 cm hypodense lesion in the spleen on image 57 of series 2. Adrenals/Urinary Tract: Scarring in the left mid to lower kidney. Adrenal glands unremarkable. Stomach/Bowel: Unremarkable Vascular/Lymphatic: Unremarkable Reproductive: Lobularity along the posterior uterine margin likely from fibroid. Adnexa unremarkable. Other: No supplemental non-categorized findings. Musculoskeletal: Mildly reduced sclerosis of the 5 mm sclerotic lesion in the right iliac bone on image 103/2. Faintly reduced sclerosis of the lesions along the L1 and L3 vertebral bodies. Lower lumbar degenerative facet arthropathy with grade 1 anterolisthesis at L5-S1, and bilateral foraminal impingement at L4-5 (left greater than right). IMPRESSION: 1. Mildly reduced sclerosis associated with some of the scattered osseous metastatic lesions. 2. Stable partially calcified right paratracheal lymph node. 3. Stable volume loss in the right lower lobe with sparing of the superior segment. 4. Other imaging findings of potential clinical significance: Mild cardiomegaly. Scarring in the left mid to lower kidney. Uterine fibroid. Lower lumbar degenerative facet arthropathy with grade 1 anterolisthesis at L5-S1 and bilateral foraminal impingement at L4-5 (left greater than right). Electronically Signed   By: Van Clines M.D.   On: 09/12/2020 16:14     ASSESSMENT/PLAN:  This is a very pleasant 59 year old Caucasian female diagnosed with stage IV (T3, N2, M1C) non-small cell lung cancer, adenocarcinoma.  She is positive for an EGFR mutation with deletion in  exon 19.  She presented with a large infrahilar mass in addition to mediastinal lymphadenopathy, metastatic disease to the liver and bone, and a suspicious brain metastasis which improved significantly after the patient started treatment with Tagrisso and was not evident on repeat MRI.  The patient was diagnosed in May 2021.  The patient is currently undergoing treatment with Tagrisso 80 mg p.o. daily.  She is status post 5 months of treatment.  The patient's first dose treatment was on Apr 21, 2020.  She has been tolerating treatment well without any concerning adverse side effects.  The patient recently had a restaging CT scan performed.  Dr. Julien Nordmann personally and independently reviewed the scan and discussed the results with the patient.  The patient scan did not show any evidence of disease progression.  Dr. Julien Nordmann recommends that the patient continue on the same treatment at the same dose.  We will see her back for follow-up visit in 1 months for evaluation and repeat blood work.  She will continue taking oxycodone for back pain.   The patient was advised to call immediately if she has any concerning symptoms in the interval. The patient voices understanding of current disease status and treatment options and is in agreement with the current care plan. All questions were answered. The patient knows to call the clinic with any problems, questions or concerns. We can certainly see the patient much sooner if necessary      Orders Placed This Encounter  Procedures  . CBC with Differential (Cancer Center Only)    Standing Status:   Future    Standing Expiration Date:   09/15/2021  . CMP ([facility] only)    Standing Status:   Future    Standing Expiration Date:   09/15/2021     Tobe Sos Aiysha Jillson, PA-C 09/15/20  ADDENDUM: Hematology/Oncology Attending: I had a face-to-face encounter with the patient today.  I recommended her care plan.  This is a very pleasant 59 years  old white female diagnosed with a stage IV non-small cell lung cancer, adenocarcinoma with positive EGFR mutation with deletion in exon 19.  The patient is currently undergoing systemic treatment with targeted therapy with Tagrisso 80 mg p.o. daily status post 5 months of treatment and has been tolerating her treatment fairly well with no significant adverse effects. The patient enjoyed few weeks vacation with her family in [place]. She had repeat CT scan of the chest, abdomen pelvis performed recently.  I personally and independently reviewed the scans and discussed the results with the patient and her husband today. Her scan showed no concerning findings for disease progression. I recommended for the patient to continue her current treatment with Tagrisso 80 mg p.o. daily. For the pain management she will continue on oxycodone on as-needed basis. The patient will come back for follow-up visit in 1 months for evaluation and repeat blood work. She was advised to call immediately if she has any concerning symptoms in the interval.  Eilleen Kempf, MD 09/16/20

## 2020-09-15 ENCOUNTER — Telehealth: Payer: Self-pay | Admitting: Physician Assistant

## 2020-09-15 ENCOUNTER — Inpatient Hospital Stay (HOSPITAL_BASED_OUTPATIENT_CLINIC_OR_DEPARTMENT_OTHER): Payer: 59 | Admitting: Physician Assistant

## 2020-09-15 ENCOUNTER — Other Ambulatory Visit: Payer: Self-pay

## 2020-09-15 VITALS — BP 163/108 | HR 63 | Temp 97.8°F | Resp 18 | Ht 64.0 in | Wt 182.5 lb

## 2020-09-15 DIAGNOSIS — Z9221 Personal history of antineoplastic chemotherapy: Secondary | ICD-10-CM | POA: Diagnosis not present

## 2020-09-15 DIAGNOSIS — R0609 Other forms of dyspnea: Secondary | ICD-10-CM | POA: Diagnosis not present

## 2020-09-15 DIAGNOSIS — C7931 Secondary malignant neoplasm of brain: Secondary | ICD-10-CM | POA: Diagnosis not present

## 2020-09-15 DIAGNOSIS — C7951 Secondary malignant neoplasm of bone: Secondary | ICD-10-CM | POA: Diagnosis present

## 2020-09-15 DIAGNOSIS — Z5111 Encounter for antineoplastic chemotherapy: Secondary | ICD-10-CM

## 2020-09-15 DIAGNOSIS — C3491 Malignant neoplasm of unspecified part of right bronchus or lung: Secondary | ICD-10-CM

## 2020-09-15 DIAGNOSIS — C787 Secondary malignant neoplasm of liver and intrahepatic bile duct: Secondary | ICD-10-CM | POA: Diagnosis present

## 2020-09-15 DIAGNOSIS — Z79899 Other long term (current) drug therapy: Secondary | ICD-10-CM | POA: Diagnosis not present

## 2020-09-15 NOTE — Telephone Encounter (Signed)
Scheduled per 10/14 los. No avs or calendar needed to be printed. Pt is mychart active.

## 2020-09-16 ENCOUNTER — Encounter: Payer: Self-pay | Admitting: Physician Assistant

## 2020-09-27 ENCOUNTER — Other Ambulatory Visit: Payer: Self-pay | Admitting: Internal Medicine

## 2020-09-27 MED ORDER — OXYCODONE HCL 5 MG PO TABS
5.0000 mg | ORAL_TABLET | Freq: Three times a day (TID) | ORAL | 0 refills | Status: DC | PRN
Start: 2020-09-27 — End: 2020-10-18

## 2020-09-27 NOTE — Telephone Encounter (Signed)
Hi Dr. Julien Nordmann, For approval or refusal. Gardiner Rhyme, RN

## 2020-09-29 ENCOUNTER — Encounter: Payer: Self-pay | Admitting: Medical Oncology

## 2020-10-10 NOTE — Progress Notes (Signed)
[facility] OFFICE PROGRESS NOTE  Patient, No Pcp Per No address on file  DIAGNOSIS: Stage IV (T3, N2, M1c)non-small cell lung cancer, adenocarcinoma with positive EGFR mutation with deletion in exon 19. She presented with large infrahilar mass in addition to mediastinal lymphadenopathy as well as metastatic disease to the liver and bone with suspicious brain metastasis that significantly improved after the patient started treatment with Tagrisso and was not evident on the repeat MRI of the brain. She was diagnosed in May 2021.  PRIOR THERAPY: None  CURRENT THERAPY: Tagrisso 80 mg p.o. daily. First dose started Apr 21, 2020. Status post 69month of treatment.  INTERVAL HISTORY: Brittany Deupree59y.o. female returns to the clinic for a follow up visit accompanied by her husband. Stable left chest/back discomfort secondary to her malignancy. She states she has experienced this since being diagnosed with lung cancer. The pain is described as burning. She takes oxycodone if her pain exceeds a 7/10 which controls her pain. The patient continues to tolerate treatment with targeted oral chemotherapy with Tagrisso well without any adverse side effects. Denies any fever, chills, night sweats, or weight loss. Denies any cough or hemoptysis. She reports some stable dyspnea on exertion. Denies any nausea, vomiting, diarrhea, or constipation. Denies any headaches. She states she is overdue for her eye exam and states her vision is not as clear. She has an upcoming appointment with her eye doctor soon. She is followed by neuro-oncologist, Dr. VMickeal Skinner for her history of suspicious brain metastasis.  Denies any rashes or skin changes. She states she walked a lot yesterday and believes her ankles may be a little swollen. She is inquiring about the covid-19 booster vaccine. The patient is here today for evaluation and repeat blood work.     MEDICAL HISTORY: Past Medical History:  Diagnosis  Date   NSCL CA DX'D 04/07/20    ALLERGIES:  is allergic to other and cefdinir.  MEDICATIONS:  Current Outpatient Medications  Medication Sig Dispense Refill   magnesium oxide (MAG-OX) 400 MG tablet Take 400 mg by mouth at bedtime.     osimertinib mesylate (TAGRISSO) 80 MG tablet Take 1 tablet (80 mg total) by mouth daily. 30 tablet 2   oxyCODONE (OXY IR/ROXICODONE) 5 MG immediate release tablet Take 1 tablet (5 mg total) by mouth 3 (three) times daily between meals as needed for severe pain. 45 tablet 0   benzonatate (TESSALON) 100 MG capsule Take 100 mg by mouth 3 (three) times daily as needed for cough.  (Patient not taking: Reported on 08/15/2020)     famotidine (PEPCID) 40 MG tablet Take 40 mg by mouth 2 (two) times daily. (Patient not taking: Reported on 09/15/2020)     ondansetron (ZOFRAN) 4 MG tablet Take 4 mg by mouth every 8 (eight) hours as needed for nausea or vomiting.  (Patient not taking: Reported on 09/15/2020)     No current facility-administered medications for this visit.    SURGICAL HISTORY:  Past Surgical History:  Procedure Laterality Date   IR REMOVAL OF PLURAL CATH W/CUFF  04/24/2020    REVIEW OF SYSTEMS:   Review of Systems  Constitutional: Negative for appetite change, chills, fatigue, fever and unexpected weight change.  HENT: Negative for mouth sores, nosebleeds, sore throat and trouble swallowing.   Eyes: Negative for eye problems and icterus.  Respiratory: Negative for cough, hemoptysis, shortness of breath and wheezing.   Cardiovascular: Positive for chronic but stable chest discomfort that radiates to her back.  Negative for leg swelling.  Gastrointestinal: Negative for abdominal pain, constipation, diarrhea, nausea and vomiting.  Genitourinary: Negative for bladder incontinence, difficulty urinating, dysuria, frequency and hematuria.   Musculoskeletal: Negative for back pain, gait problem, neck pain and neck stiffness.  Skin: Negative for itching  and rash.  Neurological: Negative for dizziness, extremity weakness, gait problem, headaches, light-headedness and seizures.  Hematological: Negative for adenopathy. Does not bruise/bleed easily.  Psychiatric/Behavioral: Negative for confusion, depression and sleep disturbance. The patient is not nervous/anxious.     PHYSICAL EXAMINATION:  Blood pressure (!) 163/103, pulse (!) 59, temperature (!) 97.3 F (36.3 C), temperature source Tympanic, resp. rate 18, height _0  (1.626 m), weight 184 lb 12.8 oz (83.8 kg), SpO2 100 %.  ECOG PERFORMANCE STATUS: 1 - Symptomatic but completely ambulatory  Physical Exam  Constitutional: Oriented to person, place, and time and well-developed, well-nourished, and in no distress.  HENT:  Head: Normocephalic and atraumatic.  Mouth/Throat: Oropharynx is clear and moist. No oropharyngeal exudate.  Eyes: Conjunctivae are normal. Right eye exhibits no discharge. Left eye exhibits no discharge. No scleral icterus.  Neck: Normal range of motion. Neck supple.  Cardiovascular: Normal rate, regular rhythm, normal heart sounds and intact distal pulses.   Pulmonary/Chest: Effort normal. Decreased breath sounds in the right lung base. No respiratory distress. No wheezes. No rales.  Abdominal: Soft. Bowel sounds are normal. Exhibits no distension and no mass. There is no tenderness.  Musculoskeletal: Normal range of motion. Exhibits no edema.  Lymphadenopathy:    No cervical adenopathy.  Neurological: Alert and oriented to person, place, and time. Exhibits normal muscle tone. Gait normal. Coordination normal.  Skin: Skin is warm and dry. No rash noted. Not diaphoretic. No erythema. No pallor.  Psychiatric: Mood, memory and judgment normal.  Vitals reviewed.  LABORATORY DATA: Lab Results  Component Value Date   WBC 4.6 10/13/2020   HGB 13.8 10/13/2020   HCT 41.6 10/13/2020   MCV 90.4 10/13/2020   PLT 159 10/13/2020      Chemistry      Component Value  Date/Time   NA 136 10/13/2020 1106   K 5.5 (H) 10/13/2020 1106   CL 102 10/13/2020 1106   CO2 29 10/13/2020 1106   BUN 16 10/13/2020 1106   CREATININE 1.15 (H) 10/13/2020 1106      Component Value Date/Time   CALCIUM 9.4 10/13/2020 1106   ALKPHOS 68 10/13/2020 1106   AST 27 10/13/2020 1106   ALT 21 10/13/2020 1106   BILITOT 0.5 10/13/2020 1106       RADIOGRAPHIC STUDIES:  No results found.   ASSESSMENT/PLAN:  This is a very pleasant 59 year old Caucasian female diagnosed with stage IV (T3, N2, M1C) non-small cell lung cancer, adenocarcinoma.  She is positive for an EGFR mutation with deletion in exon 19.  She presented with a large infrahilar mass in addition to mediastinal lymphadenopathy, metastatic disease to the liver and bone, and a suspicious brain metastasis which improved significantly after the patient started treatment with Tagrisso and was not evident on repeat MRI.  The patient was diagnosed in May 2021.  The patient is currently undergoing treatment with Tagrisso 80 mg p.o. daily.  She is status post 6 months of treatment.  The patient's first dose treatment was on Apr 21, 2020.  She has been tolerating treatment well without any concerning adverse side effects.   Labs were reviewed. The patient's labs show mild hyperkalemia. The patient eats at least one banana a day and sometimes  more as well as multivitamins. She was advised to cut back  On her high potassium intake. Recommend that she continue on the same treatment at the same dose.   We will see her back for a follow up appointment in 4 weeks for evaluation and repeat blood work.  She will continue taking oxycodone for back pain/rib pain.   Her blood pressure was elevated today. She states it is normal at home but elevated at clinics. She was advised to monitor her BP at home and keep a log of her recordings. If persistently elevated, recommend that she follow up with her PCP for BP management.   I will arrange  for a COVID-19 booster vaccine at her next appointment.   The patient was advised to call immediately if she has any concerning symptoms in the interval. The patient voices understanding of current disease status and treatment options and is in agreement with the current care plan. All questions were answered. The patient knows to call the clinic with any problems, questions or concerns. We can certainly see the patient much sooner if necessary     Orders Placed This Encounter  Procedures   CMP ([facility] only)    Standing Status:   Future    Standing Expiration Date:   10/13/2021   CBC with Differential ([facility] Only)    Standing Status:   Future    Standing Expiration Date:   10/13/2021     Nolyn Swab L Aleyda Gindlesperger, PA-C 10/13/20

## 2020-10-13 ENCOUNTER — Encounter: Payer: Self-pay | Admitting: Physician Assistant

## 2020-10-13 ENCOUNTER — Inpatient Hospital Stay: Payer: 59 | Attending: Internal Medicine | Admitting: Physician Assistant

## 2020-10-13 ENCOUNTER — Telehealth: Payer: Self-pay | Admitting: Physician Assistant

## 2020-10-13 ENCOUNTER — Inpatient Hospital Stay: Payer: 59

## 2020-10-13 ENCOUNTER — Other Ambulatory Visit: Payer: Self-pay

## 2020-10-13 VITALS — BP 163/103 | HR 59 | Temp 97.3°F | Resp 18 | Ht 64.0 in | Wt 184.8 lb

## 2020-10-13 DIAGNOSIS — Z9221 Personal history of antineoplastic chemotherapy: Secondary | ICD-10-CM | POA: Insufficient documentation

## 2020-10-13 DIAGNOSIS — R0781 Pleurodynia: Secondary | ICD-10-CM | POA: Diagnosis not present

## 2020-10-13 DIAGNOSIS — C787 Secondary malignant neoplasm of liver and intrahepatic bile duct: Secondary | ICD-10-CM | POA: Insufficient documentation

## 2020-10-13 DIAGNOSIS — E875 Hyperkalemia: Secondary | ICD-10-CM | POA: Diagnosis not present

## 2020-10-13 DIAGNOSIS — C3491 Malignant neoplasm of unspecified part of right bronchus or lung: Secondary | ICD-10-CM

## 2020-10-13 DIAGNOSIS — M549 Dorsalgia, unspecified: Secondary | ICD-10-CM | POA: Diagnosis not present

## 2020-10-13 DIAGNOSIS — C7951 Secondary malignant neoplasm of bone: Secondary | ICD-10-CM | POA: Insufficient documentation

## 2020-10-13 DIAGNOSIS — Z79899 Other long term (current) drug therapy: Secondary | ICD-10-CM | POA: Insufficient documentation

## 2020-10-13 LAB — CBC WITH DIFFERENTIAL (CANCER CENTER ONLY)
Abs Immature Granulocytes: 0.01 10*3/uL (ref 0.00–0.07)
Basophils Absolute: 0 10*3/uL (ref 0.0–0.1)
Basophils Relative: 1 %
Eosinophils Absolute: 0.1 10*3/uL (ref 0.0–0.5)
Eosinophils Relative: 1 %
HCT: 41.6 % (ref 36.0–46.0)
Hemoglobin: 13.8 g/dL (ref 12.0–15.0)
Immature Granulocytes: 0 %
Lymphocytes Relative: 22 %
Lymphs Abs: 1 10*3/uL (ref 0.7–4.0)
MCH: 30 pg (ref 26.0–34.0)
MCHC: 33.2 g/dL (ref 30.0–36.0)
MCV: 90.4 fL (ref 80.0–100.0)
Monocytes Absolute: 0.3 10*3/uL (ref 0.1–1.0)
Monocytes Relative: 7 %
Neutro Abs: 3.2 10*3/uL (ref 1.7–7.7)
Neutrophils Relative %: 69 %
Platelet Count: 159 10*3/uL (ref 150–400)
RBC: 4.6 MIL/uL (ref 3.87–5.11)
RDW: 13.2 % (ref 11.5–15.5)
WBC Count: 4.6 10*3/uL (ref 4.0–10.5)
nRBC: 0 % (ref 0.0–0.2)

## 2020-10-13 LAB — CMP (CANCER CENTER ONLY)
ALT: 21 U/L (ref 0–44)
AST: 27 U/L (ref 15–41)
Albumin: 3.9 g/dL (ref 3.5–5.0)
Alkaline Phosphatase: 68 U/L (ref 38–126)
Anion gap: 5 (ref 5–15)
BUN: 16 mg/dL (ref 6–20)
CO2: 29 mmol/L (ref 22–32)
Calcium: 9.4 mg/dL (ref 8.9–10.3)
Chloride: 102 mmol/L (ref 98–111)
Creatinine: 1.15 mg/dL — ABNORMAL HIGH (ref 0.44–1.00)
GFR, Estimated: 55 mL/min — ABNORMAL LOW (ref >60)
Glucose, Bld: 107 mg/dL — ABNORMAL HIGH (ref 70–99)
Potassium: 5.5 mmol/L — ABNORMAL HIGH (ref 3.5–5.1)
Sodium: 136 mmol/L (ref 135–145)
Total Bilirubin: 0.5 mg/dL (ref 0.3–1.2)
Total Protein: 7 g/dL (ref 6.5–8.1)

## 2020-10-13 NOTE — Telephone Encounter (Signed)
Scheduled per 11/11 los. No avs or calendar needed. Pt is mychart active.

## 2020-10-17 ENCOUNTER — Other Ambulatory Visit: Payer: Self-pay | Admitting: Internal Medicine

## 2020-10-18 ENCOUNTER — Other Ambulatory Visit: Payer: Self-pay | Admitting: Internal Medicine

## 2020-10-18 MED ORDER — OXYCODONE HCL 5 MG PO TABS
5.0000 mg | ORAL_TABLET | Freq: Three times a day (TID) | ORAL | 0 refills | Status: DC | PRN
Start: 2020-10-18 — End: 2020-11-07

## 2020-10-18 NOTE — Telephone Encounter (Signed)
Refill request for your review

## 2020-10-31 ENCOUNTER — Encounter: Payer: Self-pay | Admitting: Internal Medicine

## 2020-11-07 ENCOUNTER — Other Ambulatory Visit: Payer: Self-pay | Admitting: Internal Medicine

## 2020-11-07 MED ORDER — OXYCODONE HCL 5 MG PO TABS
5.0000 mg | ORAL_TABLET | Freq: Three times a day (TID) | ORAL | 0 refills | Status: DC | PRN
Start: 2020-11-07 — End: 2020-11-28

## 2020-11-07 NOTE — Telephone Encounter (Signed)
See refill request.

## 2020-11-14 ENCOUNTER — Other Ambulatory Visit: Payer: Self-pay

## 2020-11-14 ENCOUNTER — Inpatient Hospital Stay: Payer: 59 | Attending: Internal Medicine | Admitting: Internal Medicine

## 2020-11-14 ENCOUNTER — Inpatient Hospital Stay: Payer: 59

## 2020-11-14 ENCOUNTER — Encounter: Payer: Self-pay | Admitting: Internal Medicine

## 2020-11-14 VITALS — BP 172/105 | HR 69 | Temp 97.6°F | Resp 18 | Ht 64.0 in | Wt 188.3 lb

## 2020-11-14 DIAGNOSIS — I10 Essential (primary) hypertension: Secondary | ICD-10-CM | POA: Insufficient documentation

## 2020-11-14 DIAGNOSIS — C3491 Malignant neoplasm of unspecified part of right bronchus or lung: Secondary | ICD-10-CM

## 2020-11-14 DIAGNOSIS — C7951 Secondary malignant neoplasm of bone: Secondary | ICD-10-CM | POA: Insufficient documentation

## 2020-11-14 DIAGNOSIS — Z5111 Encounter for antineoplastic chemotherapy: Secondary | ICD-10-CM | POA: Diagnosis not present

## 2020-11-14 DIAGNOSIS — C787 Secondary malignant neoplasm of liver and intrahepatic bile duct: Secondary | ICD-10-CM | POA: Insufficient documentation

## 2020-11-14 DIAGNOSIS — C7931 Secondary malignant neoplasm of brain: Secondary | ICD-10-CM | POA: Diagnosis not present

## 2020-11-14 DIAGNOSIS — Z79899 Other long term (current) drug therapy: Secondary | ICD-10-CM | POA: Insufficient documentation

## 2020-11-14 DIAGNOSIS — C3490 Malignant neoplasm of unspecified part of unspecified bronchus or lung: Secondary | ICD-10-CM

## 2020-11-14 LAB — CBC WITH DIFFERENTIAL (CANCER CENTER ONLY)
Abs Immature Granulocytes: 0.01 10*3/uL (ref 0.00–0.07)
Basophils Absolute: 0 10*3/uL (ref 0.0–0.1)
Basophils Relative: 1 %
Eosinophils Absolute: 0.1 10*3/uL (ref 0.0–0.5)
Eosinophils Relative: 1 %
HCT: 42.8 % (ref 36.0–46.0)
Hemoglobin: 14.1 g/dL (ref 12.0–15.0)
Immature Granulocytes: 0 %
Lymphocytes Relative: 26 %
Lymphs Abs: 1.3 10*3/uL (ref 0.7–4.0)
MCH: 30.3 pg (ref 26.0–34.0)
MCHC: 32.9 g/dL (ref 30.0–36.0)
MCV: 92 fL (ref 80.0–100.0)
Monocytes Absolute: 0.3 10*3/uL (ref 0.1–1.0)
Monocytes Relative: 6 %
Neutro Abs: 3.4 10*3/uL (ref 1.7–7.7)
Neutrophils Relative %: 66 %
Platelet Count: 175 10*3/uL (ref 150–400)
RBC: 4.65 MIL/uL (ref 3.87–5.11)
RDW: 13.1 % (ref 11.5–15.5)
WBC Count: 5.1 10*3/uL (ref 4.0–10.5)
nRBC: 0 % (ref 0.0–0.2)

## 2020-11-14 LAB — CMP (CANCER CENTER ONLY)
ALT: 28 U/L (ref 0–44)
AST: 35 U/L (ref 15–41)
Albumin: 3.8 g/dL (ref 3.5–5.0)
Alkaline Phosphatase: 75 U/L (ref 38–126)
Anion gap: 6 (ref 5–15)
BUN: 12 mg/dL (ref 6–20)
CO2: 29 mmol/L (ref 22–32)
Calcium: 9.5 mg/dL (ref 8.9–10.3)
Chloride: 101 mmol/L (ref 98–111)
Creatinine: 1.09 mg/dL — ABNORMAL HIGH (ref 0.44–1.00)
GFR, Estimated: 58 mL/min — ABNORMAL LOW (ref >60)
Glucose, Bld: 88 mg/dL (ref 70–99)
Potassium: 4.6 mmol/L (ref 3.5–5.1)
Sodium: 136 mmol/L (ref 135–145)
Total Bilirubin: 0.5 mg/dL (ref 0.3–1.2)
Total Protein: 7.1 g/dL (ref 6.5–8.1)

## 2020-11-14 NOTE — Progress Notes (Signed)
[place] [phone]   [phone]  OFFICE PROGRESS NOTE  Patient, No Pcp Per No address on file  DIAGNOSIS: Stage IV (T3, N2, M1c) non-small cell lung cancer, adenocarcinoma with positive EGFR mutation with deletion in exon 19 and presented with large infrahilar mass in addition to mediastinal lymphadenopathy as well as metastatic disease to the liver and bone with suspicious brain metastasis that significantly improved after the patient started treatment with Tagrisso and was not evident on the repeat MRI of the brain few days ago.  This was diagnosed in May 2021.  PRIOR THERAPY: None  CURRENT THERAPY: Tagrisso 80 mg p.o. daily.  First dose started Apr 21, 2020.  Status post 7 months of treatment.  INTERVAL HISTORY: Brittany Mccarty 59 y.o. female returns to the clinic today for follow-up visit.  The patient is feeling fine today with no concerning complaints.  She denied having any current chest pain, shortness of breath, cough or hemoptysis.  She denied having any fever or chills.  She has no nausea, vomiting, diarrhea or constipation.  She has no headache or visual changes.  She denied having any recent weight loss or night sweats.  She continues to tolerate her treatment with Tagrisso fairly well.  The patient is here today for evaluation and repeat blood work.  MEDICAL HISTORY: Past Medical History:  Diagnosis Date   NSCL CA DX'D 04/07/20    ALLERGIES:  is allergic to other and cefdinir.  MEDICATIONS:  Current Outpatient Medications  Medication Sig Dispense Refill   benzonatate (TESSALON) 100 MG capsule Take 100 mg by mouth 3 (three) times daily as needed for cough.  (Patient not taking: Reported on 08/15/2020)     famotidine (PEPCID) 40 MG tablet Take 40 mg by mouth 2 (two) times daily. (Patient not taking: Reported on 09/15/2020)     magnesium oxide (MAG-OX) 400 MG tablet Take 400 mg by mouth at bedtime.     ondansetron (ZOFRAN) 4  MG tablet Take 4 mg by mouth every 8 (eight) hours as needed for nausea or vomiting.  (Patient not taking: Reported on 09/15/2020)     osimertinib mesylate (TAGRISSO) 80 MG tablet Take 1 tablet (80 mg total) by mouth daily. 30 tablet 2   oxyCODONE (OXY IR/ROXICODONE) 5 MG immediate release tablet Take 1 tablet (5 mg total) by mouth 3 (three) times daily between meals as needed for severe pain. 45 tablet 0   No current facility-administered medications for this visit.    SURGICAL HISTORY:  Past Surgical History:  Procedure Laterality Date   IR REMOVAL OF PLURAL CATH W/CUFF  04/24/2020    REVIEW OF SYSTEMS:  A comprehensive review of systems was negative.   PHYSICAL EXAMINATION: General appearance: alert, cooperative and no distress Head: Normocephalic, without obvious abnormality, atraumatic Neck: no adenopathy, no JVD, supple, symmetrical, trachea midline and thyroid not enlarged, symmetric, no tenderness/mass/nodules Lymph nodes: Cervical, supraclavicular, and axillary nodes normal. Resp: clear to auscultation bilaterally Back: symmetric, no curvature. ROM normal. No CVA tenderness. Cardio: regular rate and rhythm, S1, S2 normal, no murmur, click, rub or gallop GI: soft, non-tender; bowel sounds normal; no masses,  no organomegaly Extremities: extremities normal, atraumatic, no cyanosis or edema  ECOG PERFORMANCE STATUS: 0 - Asymptomatic  Blood pressure (!) 172/105, pulse 69, temperature 97.6 F (36.4 C), temperature source Tympanic, resp. rate 18, height _0  (1.626 m), weight 188 lb 4.8 oz (85.4 kg), SpO2 100 %.  LABORATORY DATA: Lab Results  Component Value Date   WBC 5.1 11/14/2020   HGB 14.1 11/14/2020   HCT 42.8 11/14/2020   MCV 92.0 11/14/2020   PLT 175 11/14/2020      Chemistry      Component Value Date/Time   NA 136 10/13/2020 1106   K 5.5 (H) 10/13/2020 1106   CL 102 10/13/2020 1106   CO2 29 10/13/2020 1106   BUN 16 10/13/2020 1106   CREATININE 1.15 (H)  10/13/2020 1106      Component Value Date/Time   CALCIUM 9.4 10/13/2020 1106   ALKPHOS 68 10/13/2020 1106   AST 27 10/13/2020 1106   ALT 21 10/13/2020 1106   BILITOT 0.5 10/13/2020 1106       RADIOGRAPHIC STUDIES: No results found.  ASSESSMENT AND PLAN: This is a very pleasant 59 years old white female with stage IV (T3, N2, M1c) non-small cell lung cancer, adenocarcinoma with positive EGFR mutation with deletion in exon 19 and presented with large infrahilar mass in addition to mediastinal lymphadenopathy as well as metastatic disease to the liver and bone with suspicious brain metastasis that significantly improved after the patient started treatment with Tagrisso and was not evident on the repeat MRI of the brain few days ago.  This was diagnosed in May 2021. The patient is currently undergoing treatment with Tagrisso 80 mg p.o. daily started Apr 21, 2020.  She is status post 7 months of treatment and has been tolerating her treatment with Tagrisso fairly well. The patient continues to tolerate her treatment fairly well with no concerning adverse effects. Her lab work is unremarkable today. I recommended for her to continue her current treatment with Tagrisso with the same dose. I will see her back for follow-up visit in 1 months for evaluation with repeat CT scan of the chest, abdomen pelvis for restaging of her disease. For the hypertension, she was advised to take her blood pressure medication as prescribed and to monitor it closely at home. For the back pain, she will continue her current treatment with Percocet on as-needed basis. The patient was advised to call immediately if she has any concerning symptoms in the interval. The patient voices understanding of current disease status and treatment options and is in agreement with the current care plan. All questions were answered. The patient knows to call the clinic with any problems, questions or concerns. We can certainly see the  patient much sooner if necessary.  Disclaimer: This note was dictated with voice recognition software. Similar sounding words can inadvertently be transcribed and may not be corrected upon review.

## 2020-11-21 ENCOUNTER — Telehealth: Payer: Self-pay | Admitting: *Deleted

## 2020-11-21 ENCOUNTER — Other Ambulatory Visit: Payer: Self-pay | Admitting: Physician Assistant

## 2020-11-21 DIAGNOSIS — C3491 Malignant neoplasm of unspecified part of right bronchus or lung: Secondary | ICD-10-CM

## 2020-11-21 MED ORDER — OSIMERTINIB MESYLATE 80 MG PO TABS: 80.0000 mg | ORAL_TABLET | Freq: Every day | ORAL | 2 refills | Status: DC

## 2020-11-21 NOTE — Telephone Encounter (Signed)
Received call from [facility] needing refill of Tagrisso for patient.    Routed to [facility], [place]

## 2020-11-28 ENCOUNTER — Other Ambulatory Visit: Payer: Self-pay | Admitting: Internal Medicine

## 2020-11-28 DIAGNOSIS — C3491 Malignant neoplasm of unspecified part of right bronchus or lung: Secondary | ICD-10-CM

## 2020-11-28 MED ORDER — OXYCODONE HCL 5 MG PO TABS
5.0000 mg | ORAL_TABLET | Freq: Three times a day (TID) | ORAL | 0 refills | Status: DC | PRN
Start: 2020-11-28 — End: 2021-01-02

## 2020-11-28 NOTE — Telephone Encounter (Signed)
refill 

## 2020-12-09 NOTE — Progress Notes (Signed)
[facility] OFFICE PROGRESS NOTE  Patient, No Pcp Per No address on file  DIAGNOSIS: Stage IV (T3, N2, M1c)non-small cell lung cancer, adenocarcinoma with positive EGFR mutationwith deletion in exon 19. Shepresented with large infrahilar mass in addition to mediastinal lymphadenopathy as well as metastatic disease to the liver and bone with suspicious brain metastasis that significantly improved after the patient started treatment with Tagrisso and was not evident on the repeat MRI of the brain.She wasdiagnosed in May 2021.  PRIOR THERAPY: None  CURRENT THERAPY: Tagrisso 80 mg p.o. daily. First dose started Apr 21, 2020. Status post13month of treatment.  INTERVAL HISTORY: Brittany Maultsby60y.o. female returns to the clinic for a follow up visit. She is feeling well today without any concerning complaints. The patient reportedly had chicken pox last week. She broke out into a full body rash that resolved after 5 days or so. The patient states that it was the characteristic chicken pox rash. She states she sent a picture to her daughter in-law who is a pediatric nurse who confirmed this. Her rash has resolved at this time. She denies fevers or chills. She states it did not look like a drug rash. She feels well today.   The patient continues to tolerate treatment withtargeted oral chemotherapy with Tagrissowell without any adverse sideeffects. Denies any night sweats or weight loss. Denies any cough or hemoptysis. She reports some stable dyspnea on exertion.She has some pleuritic chest pain with a deep breath. She has intermittent chest/back pain for which she takes oxycodone only for when she has severe pain. Denies any nausea, vomiting, diarrhea, or constipation. Denies any headaches. She is followed by neuro-oncologist, Dr. VMickeal Skinner for her history of suspicious brain metastasis. The patient's blood pressure readings are typically high in the clinic. She states she has  a BP cuff at home which her readings are typically ~130/83. She is not on any anti-hypertensive medication. The patient recently had a restaging CT scan performed of the chest, abdomen, and pelvis.  The patient is here today for evaluationand to review her scan results.   MEDICAL HISTORY: Past Medical History:  Diagnosis Date   NSCL CA DX'D 04/07/20    ALLERGIES:  is allergic to azithromycin, other, and cefdinir.  MEDICATIONS:  Current Outpatient Medications  Medication Sig Dispense Refill   magnesium oxide (MAG-OX) 400 MG tablet Take 400 mg by mouth at bedtime.     osimertinib mesylate (TAGRISSO) 80 MG tablet Take 1 tablet (80 mg total) by mouth daily. 30 tablet 2   oxyCODONE (OXY IR/ROXICODONE) 5 MG immediate release tablet Take 1 tablet (5 mg total) by mouth 3 (three) times daily between meals as needed for severe pain. 45 tablet 0   benzonatate (TESSALON) 100 MG capsule Take 100 mg by mouth 3 (three) times daily as needed for cough.  (Patient not taking: Reported on 12/14/2020)     famotidine (PEPCID) 40 MG tablet Take 40 mg by mouth 2 (two) times daily. (Patient not taking: Reported on 12/14/2020)     ondansetron (ZOFRAN) 4 MG tablet Take 4 mg by mouth every 8 (eight) hours as needed for nausea or vomiting.  (Patient not taking: Reported on 12/14/2020)     No current facility-administered medications for this visit.    SURGICAL HISTORY:  Past Surgical History:  Procedure Laterality Date   IR REMOVAL OF PLURAL CATH W/CUFF  04/24/2020    REVIEW OF SYSTEMS:   Review of Systems  Constitutional: Negative for appetite change, chills,  fatigue, fever and unexpected weight change.  HENT: Negative for mouth sores, nosebleeds, sore throat and trouble swallowing.  Eyes: Negative for eye problems and icterus.  Respiratory: Positive for shortness of breath with exertion. Negative for cough, hemoptysis, and wheezing.  Cardiovascular:Positive for chronic but stable chest discomfort  that radiates to her back.Negative for leg swelling.  Gastrointestinal: Negative for abdominal pain, constipation, diarrhea, nausea and vomiting.  Genitourinary: Negative for bladder incontinence, difficulty urinating, dysuria, frequency and hematuria.  Musculoskeletal: Negative for back pain, gait problem, neck pain and neck stiffness.  Skin: Negative for itching and rash.  Neurological: Negative for dizziness, extremity weakness, gait problem, headaches, light-headedness and seizures.  Hematological: Negative for adenopathy. Does not bruise/bleed easily.  Psychiatric/Behavioral: Negative for confusion, depression and sleep disturbance. The patient is not nervous/anxious.     PHYSICAL EXAMINATION:  Blood pressure (!) 168/118, pulse 65, temperature (!) 96.8 F (36 C), temperature source Tympanic, resp. rate 17, height _0  (1.626 m), weight 189 lb 8 oz (86 kg), SpO2 100 %.  ECOG PERFORMANCE STATUS: 1 - Symptomatic but completely ambulatory  Physical Exam  Constitutional: Oriented to person, place, and time and well-developed, well-nourished, and in no distress.  HENT:  Head: Normocephalic and atraumatic.  Mouth/Throat: Oropharynx is clear and moist. No oropharyngeal exudate.  Eyes: Conjunctivae are normal. Right eye exhibits no discharge. Left eye exhibits no discharge. No scleral icterus.  Neck: Normal range of motion. Neck supple.  Cardiovascular: Normal rate, regular rhythm, normal heart sounds and intact distal pulses.   Pulmonary/Chest: Effort normal. Decreased breath sounds in the right lung base. No respiratory distress. No wheezes. No rales.  Abdominal: Soft. Bowel sounds are normal. Exhibits no distension and no mass. There is no tenderness.  Musculoskeletal: Normal range of motion. Exhibits no edema.  Lymphadenopathy:    No cervical adenopathy.  Neurological: Alert and oriented to person, place, and time. Exhibits normal muscle tone. Gait normal. Coordination normal.  Skin:  Skin is warm and dry. No rash noted. Not diaphoretic. No erythema. No pallor.  Psychiatric: Mood, memory and judgment normal.  Vitals reviewed.  LABORATORY DATA: Lab Results  Component Value Date   WBC 5.0 12/12/2020   HGB 14.2 12/12/2020   HCT 41.5 12/12/2020   MCV 89.1 12/12/2020   PLT 161 12/12/2020      Chemistry      Component Value Date/Time   NA 136 12/12/2020 1044   K 4.7 12/12/2020 1044   CL 102 12/12/2020 1044   CO2 27 12/12/2020 1044   BUN 11 12/12/2020 1044   CREATININE 1.07 (H) 12/12/2020 1044      Component Value Date/Time   CALCIUM 9.6 12/12/2020 1044   ALKPHOS 74 12/12/2020 1044   AST 46 (H) 12/12/2020 1044   ALT 44 12/12/2020 1044   BILITOT 0.5 12/12/2020 1044       RADIOGRAPHIC STUDIES:  CT Chest W Contrast  Result Date: 12/12/2020 CLINICAL DATA:  Restaging non-small cell lung cancer. EXAM: CT CHEST, ABDOMEN, AND PELVIS WITH CONTRAST TECHNIQUE: Multidetector CT imaging of the chest, abdomen and pelvis was performed following the standard protocol during bolus administration of intravenous contrast. CONTRAST:  167m OMNIPAQUE IOHEXOL 300 MG/ML  SOLN COMPARISON:  09/12/2020 FINDINGS: CT CHEST FINDINGS Cardiovascular: The heart is normal in size. No pericardial effusion. There is moderate tortuosity of the thoracic aorta but no focal aneurysm or dissection. No definite coronary artery calcifications. Mediastinum/Nodes: Stable partially calcified and a chronic appearing node in the right paratracheal area measuring 2.6  x 2.4 cm. No other enlarged mediastinal or hilar lymph nodes are identified. The esophagus is grossly normal. The thyroid gland is unremarkable. Lungs/Pleura: Stable right apical scarring changes. Stable band of ground-glass opacity in the left upper lobe, likely scarring change. Stable right lower lobe scarring changes, possibly radiation fibrosis. There are also scattered calcified granulomas. 3 mm nodule in the right apex on image number 33/4 is  stable. Stable 5 mm nodule in the right upper lobe on image number 56/4. Stable 7 mm nodule in the right lower lobe on image number 74/4. I do not see any definite nodules in the left lung. No new lung lesions/nodules. Musculoskeletal: Fading sclerotic bone lesions much more difficult to see on today's exam suggesting further healing. No new or progressive findings. CT ABDOMEN PELVIS FINDINGS Hepatobiliary: No worrisome hepatic lesions to suggest metastatic disease. The gallbladder is unremarkable. No common bile duct dilatation. Pancreas: No mass, inflammation ductal dilatation. Spleen: Stable splenic granulomas and stable low-attenuation lesion laterally. Adrenals/Urinary Tract: Adrenal glands and kidneys are unremarkable and stable. No worrisome renal lesions or hydronephrosis. The bladder is unremarkable. Stomach/Bowel: The stomach, duodenum, small bowel and colon are unremarkable. No acute inflammatory changes, mass lesions or obstructive findings. Vascular/Lymphatic: The aorta is normal in caliber. No dissection. The branch vessels are patent. The major venous structures are patent. No mesenteric or retroperitoneal mass or adenopathy. Small scattered lymph nodes are noted. Reproductive: The uterus and ovaries are unremarkable and stable. Small uterine fibroids are noted. Other: No pelvic mass or adenopathy. No free pelvic fluid collections. No inguinal mass or adenopathy. No abdominal wall hernia or subcutaneous lesions. Musculoskeletal: Progressive healing changes involving the metastatic bone lesions with fading any areas of faint sclerosis involving the lumbar spine and right iliac bone. IMPRESSION: 1. Progressive healing changes involving the metastatic bone lesions. No new or progressive findings. 2. Stable partially calcified and chronic appearing right paratracheal lymph node. No new or progressive findings. 3. Stable right-sided pulmonary nodules. No new or progressive findings. 4. No findings for  abdominal/pelvic metastatic disease. 5. Stable splenic granulomas. 6. Aortic atherosclerosis. Aortic Atherosclerosis (ICD10-I70.0). Electronically Signed   By: Marijo Sanes M.D.   On: 12/12/2020 12:50   CT Abdomen Pelvis W Contrast  Result Date: 12/12/2020 CLINICAL DATA:  Restaging non-small cell lung cancer. EXAM: CT CHEST, ABDOMEN, AND PELVIS WITH CONTRAST TECHNIQUE: Multidetector CT imaging of the chest, abdomen and pelvis was performed following the standard protocol during bolus administration of intravenous contrast. CONTRAST:  139m OMNIPAQUE IOHEXOL 300 MG/ML  SOLN COMPARISON:  09/12/2020 FINDINGS: CT CHEST FINDINGS Cardiovascular: The heart is normal in size. No pericardial effusion. There is moderate tortuosity of the thoracic aorta but no focal aneurysm or dissection. No definite coronary artery calcifications. Mediastinum/Nodes: Stable partially calcified and a chronic appearing node in the right paratracheal area measuring 2.6 x 2.4 cm. No other enlarged mediastinal or hilar lymph nodes are identified. The esophagus is grossly normal. The thyroid gland is unremarkable. Lungs/Pleura: Stable right apical scarring changes. Stable band of ground-glass opacity in the left upper lobe, likely scarring change. Stable right lower lobe scarring changes, possibly radiation fibrosis. There are also scattered calcified granulomas. 3 mm nodule in the right apex on image number 33/4 is stable. Stable 5 mm nodule in the right upper lobe on image number 56/4. Stable 7 mm nodule in the right lower lobe on image number 74/4. I do not see any definite nodules in the left lung. No new lung lesions/nodules. Musculoskeletal: Fading sclerotic  bone lesions much more difficult to see on today's exam suggesting further healing. No new or progressive findings. CT ABDOMEN PELVIS FINDINGS Hepatobiliary: No worrisome hepatic lesions to suggest metastatic disease. The gallbladder is unremarkable. No common bile duct dilatation.  Pancreas: No mass, inflammation ductal dilatation. Spleen: Stable splenic granulomas and stable low-attenuation lesion laterally. Adrenals/Urinary Tract: Adrenal glands and kidneys are unremarkable and stable. No worrisome renal lesions or hydronephrosis. The bladder is unremarkable. Stomach/Bowel: The stomach, duodenum, small bowel and colon are unremarkable. No acute inflammatory changes, mass lesions or obstructive findings. Vascular/Lymphatic: The aorta is normal in caliber. No dissection. The branch vessels are patent. The major venous structures are patent. No mesenteric or retroperitoneal mass or adenopathy. Small scattered lymph nodes are noted. Reproductive: The uterus and ovaries are unremarkable and stable. Small uterine fibroids are noted. Other: No pelvic mass or adenopathy. No free pelvic fluid collections. No inguinal mass or adenopathy. No abdominal wall hernia or subcutaneous lesions. Musculoskeletal: Progressive healing changes involving the metastatic bone lesions with fading any areas of faint sclerosis involving the lumbar spine and right iliac bone. IMPRESSION: 1. Progressive healing changes involving the metastatic bone lesions. No new or progressive findings. 2. Stable partially calcified and chronic appearing right paratracheal lymph node. No new or progressive findings. 3. Stable right-sided pulmonary nodules. No new or progressive findings. 4. No findings for abdominal/pelvic metastatic disease. 5. Stable splenic granulomas. 6. Aortic atherosclerosis. Aortic Atherosclerosis (ICD10-I70.0). Electronically Signed   By: Marijo Sanes M.D.   On: 12/12/2020 12:50     ASSESSMENT/PLAN:  This is a very pleasant 60 year old Caucasian female diagnosed with stage IV (T3, N2, M1C) non-small cell lung cancer, adenocarcinoma. She is positive for an EGFR mutation with deletion in exon 19. She presented with a large infrahilar mass in addition to mediastinal lymphadenopathy, metastatic disease to  the liver and bone, and a suspicious brain metastasis which improved significantly after the patient started treatment with Tagrisso and was not evident on repeat MRI. The patient was diagnosed in May 2021.  The patient is currently undergoing treatment with Tagrisso 80 mg p.o. daily. She is status post 8 months of treatment. The patient's first dose treatment was on Apr 21, 2020. She has been tolerating treatment well without any concerning adverse side effects.  The patient recently had a restaging CT scan performed.  Dr. Julien Nordmann personally and independently reviewed the scan discussed the results with the patient today.  The scan showed no evidence for disease progression  Dr. Earlie Server recommends that she continue on the same treatment at the same dose.  We will see her back for follow-up visit in 6 weeks for evaluation and repeat blood work.  The patient will continue to take oxycodone as needed for significant backslash rib pain. She was encouraged to try to use tylenol or conservative measures first though. The goal is to eventually discontinue her pain medication.   The patient reportedly checks her BP daily at home. She states at the most, her systolic BP is 382. Her diastolic BP typically is ~50 per patient report. We will recheck her BP while in the clinic today. I encouraged her to bring her BP cuff to a follow up provider visit to make sure that her machine is calibrated correctly.   The patient was advised to call immediately if she has any concerning symptoms in the interval. The patient voices understanding of current disease status and treatment options and is in agreement with the current care plan. All questions were  answered. The patient knows to call the clinic with any problems, questions or concerns. We can certainly see the patient much sooner if necessary    Orders Placed This Encounter  Procedures   CBC with Differential ([facility] Only)    Standing Status:    Future    Standing Expiration Date:   12/14/2021   CMP ([facility] only)    Standing Status:   Future    Standing Expiration Date:   12/14/2021     I spent 20-29 minutes in this encounter.   Mataeo Ingwersen L Dupree Givler, PA-C 12/14/20  ADDENDUM: Hematology/Oncology Attending: I had a face-to-face encounter with the patient today.  I recommended her care plan.  This is a very pleasant 60 years old white female diagnosed with a stage IV non-small cell lung cancer, adenocarcinoma with positive EGFR mutation with deletion in exon 19 and May 2021. The patient started treatment with targeted therapy with Tagrisso 80 mg p.o. daily status post 8 months of treatment and has been tolerating this treatment well with no significant adverse effects. The patient had repeat CT scan of the chest, abdomen pelvis performed recently.  I personally and independently reviewed the scans and discussed the results with the patient today. Her scan showed no concerning findings for disease progression and there was more healing process in the bone metastasis. I recommended for the patient to continue her current treatment with Tagrisso with the same dose. She will come back for follow-up visit in around 6 weeks for evaluation and repeat blood work. The patient was advised to call immediately if she has any other concerning symptoms in the interval.  The total time spent in the appointment was 33 minutes.   Disclaimer: This note was dictated with voice recognition software. Similar sounding words can inadvertently be transcribed and may be missed upon review. Eilleen Kempf, MD 12/14/20

## 2020-12-12 ENCOUNTER — Inpatient Hospital Stay: Payer: 59 | Attending: Internal Medicine

## 2020-12-12 ENCOUNTER — Ambulatory Visit (HOSPITAL_COMMUNITY)
Admission: RE | Admit: 2020-12-12 | Discharge: 2020-12-12 | Disposition: A | Discharge status: Home / Self Care | Payer: 59 | Source: Ambulatory Visit | Attending: Internal Medicine | Admitting: Internal Medicine

## 2020-12-12 ENCOUNTER — Encounter (HOSPITAL_COMMUNITY): Payer: Self-pay

## 2020-12-12 ENCOUNTER — Other Ambulatory Visit: Payer: Self-pay

## 2020-12-12 DIAGNOSIS — C787 Secondary malignant neoplasm of liver and intrahepatic bile duct: Secondary | ICD-10-CM | POA: Insufficient documentation

## 2020-12-12 DIAGNOSIS — C3490 Malignant neoplasm of unspecified part of unspecified bronchus or lung: Secondary | ICD-10-CM | POA: Diagnosis not present

## 2020-12-12 DIAGNOSIS — C3491 Malignant neoplasm of unspecified part of right bronchus or lung: Secondary | ICD-10-CM | POA: Insufficient documentation

## 2020-12-12 DIAGNOSIS — Z79899 Other long term (current) drug therapy: Secondary | ICD-10-CM | POA: Insufficient documentation

## 2020-12-12 DIAGNOSIS — C7951 Secondary malignant neoplasm of bone: Secondary | ICD-10-CM | POA: Insufficient documentation

## 2020-12-12 LAB — CBC WITH DIFFERENTIAL (CANCER CENTER ONLY)
Abs Immature Granulocytes: 0.01 10*3/uL (ref 0.00–0.07)
Basophils Absolute: 0 10*3/uL (ref 0.0–0.1)
Basophils Relative: 1 %
Eosinophils Absolute: 0 10*3/uL (ref 0.0–0.5)
Eosinophils Relative: 1 %
HCT: 41.5 % (ref 36.0–46.0)
Hemoglobin: 14.2 g/dL (ref 12.0–15.0)
Immature Granulocytes: 0 %
Lymphocytes Relative: 20 %
Lymphs Abs: 1 10*3/uL (ref 0.7–4.0)
MCH: 30.5 pg (ref 26.0–34.0)
MCHC: 34.2 g/dL (ref 30.0–36.0)
MCV: 89.1 fL (ref 80.0–100.0)
Monocytes Absolute: 0.3 10*3/uL (ref 0.1–1.0)
Monocytes Relative: 6 %
Neutro Abs: 3.7 10*3/uL (ref 1.7–7.7)
Neutrophils Relative %: 72 %
Platelet Count: 161 10*3/uL (ref 150–400)
RBC: 4.66 MIL/uL (ref 3.87–5.11)
RDW: 12.9 % (ref 11.5–15.5)
WBC Count: 5 10*3/uL (ref 4.0–10.5)
nRBC: 0 % (ref 0.0–0.2)

## 2020-12-12 LAB — CMP (CANCER CENTER ONLY)
ALT: 44 U/L (ref 0–44)
AST: 46 U/L — ABNORMAL HIGH (ref 15–41)
Albumin: 3.9 g/dL (ref 3.5–5.0)
Alkaline Phosphatase: 74 U/L (ref 38–126)
Anion gap: 7 (ref 5–15)
BUN: 11 mg/dL (ref 6–20)
CO2: 27 mmol/L (ref 22–32)
Calcium: 9.6 mg/dL (ref 8.9–10.3)
Chloride: 102 mmol/L (ref 98–111)
Creatinine: 1.07 mg/dL — ABNORMAL HIGH (ref 0.44–1.00)
GFR, Estimated: 59 mL/min — ABNORMAL LOW (ref >60)
Glucose, Bld: 91 mg/dL (ref 70–99)
Potassium: 4.7 mmol/L (ref 3.5–5.1)
Sodium: 136 mmol/L (ref 135–145)
Total Bilirubin: 0.5 mg/dL (ref 0.3–1.2)
Total Protein: 7.3 g/dL (ref 6.5–8.1)

## 2020-12-12 MED ORDER — IOHEXOL 300 MG/ML  SOLN
100.0000 mL | Freq: Once | INTRAMUSCULAR | Status: AC | PRN
  Administered 2020-12-12: 100 mL via INTRAVENOUS

## 2020-12-14 ENCOUNTER — Other Ambulatory Visit: Payer: Self-pay

## 2020-12-14 ENCOUNTER — Inpatient Hospital Stay (HOSPITAL_BASED_OUTPATIENT_CLINIC_OR_DEPARTMENT_OTHER): Payer: 59 | Admitting: Physician Assistant

## 2020-12-14 VITALS — BP 168/118 | HR 65 | Temp 96.8°F | Resp 17 | Ht 64.0 in | Wt 189.5 lb

## 2020-12-14 DIAGNOSIS — C3491 Malignant neoplasm of unspecified part of right bronchus or lung: Secondary | ICD-10-CM | POA: Diagnosis not present

## 2020-12-14 DIAGNOSIS — C7951 Secondary malignant neoplasm of bone: Secondary | ICD-10-CM | POA: Diagnosis present

## 2020-12-14 DIAGNOSIS — Z79899 Other long term (current) drug therapy: Secondary | ICD-10-CM | POA: Diagnosis not present

## 2020-12-14 DIAGNOSIS — C787 Secondary malignant neoplasm of liver and intrahepatic bile duct: Secondary | ICD-10-CM | POA: Diagnosis present

## 2020-12-15 ENCOUNTER — Telehealth: Payer: Self-pay | Admitting: Internal Medicine

## 2020-12-15 NOTE — Telephone Encounter (Signed)
Scheduled appts per 1/12 los. Left voicemail with appt date and time.

## 2020-12-19 ENCOUNTER — Encounter: Payer: Self-pay | Admitting: Internal Medicine

## 2020-12-19 ENCOUNTER — Other Ambulatory Visit: Payer: Self-pay | Admitting: Medical Oncology

## 2020-12-19 MED ORDER — OSIMERTINIB MESYLATE 80 MG PO TABS: 80.0000 mg | ORAL_TABLET | Freq: Every day | ORAL | 2 refills | Status: DC

## 2020-12-19 NOTE — Addendum Note (Signed)
Addended by: Ardeen Garland on: 12/19/2020 01:21 PM   Modules accepted: Orders

## 2020-12-26 ENCOUNTER — Encounter: Payer: Self-pay | Admitting: Internal Medicine

## 2021-01-02 ENCOUNTER — Other Ambulatory Visit: Payer: Self-pay | Admitting: Internal Medicine

## 2021-01-02 ENCOUNTER — Encounter: Payer: Self-pay | Admitting: Internal Medicine

## 2021-01-02 MED ORDER — OXYCODONE HCL 5 MG PO TABS
5.0000 mg | ORAL_TABLET | Freq: Three times a day (TID) | ORAL | 0 refills | Status: DC | PRN
Start: 2021-01-02 — End: 2021-02-16

## 2021-01-02 NOTE — Telephone Encounter (Signed)
For your review. Threasa Beards

## 2021-01-13 ENCOUNTER — Telehealth: Payer: Self-pay | Admitting: Internal Medicine

## 2021-01-13 NOTE — Telephone Encounter (Signed)
Release: [id number] Faxed medical records to [facility]: [place] @ fax# [phone]

## 2021-01-25 ENCOUNTER — Other Ambulatory Visit: Payer: Self-pay

## 2021-01-25 ENCOUNTER — Inpatient Hospital Stay: Payer: 59

## 2021-01-25 ENCOUNTER — Inpatient Hospital Stay: Payer: 59 | Attending: Internal Medicine | Admitting: Internal Medicine

## 2021-01-25 VITALS — BP 159/98 | HR 64 | Temp 97.9°F | Resp 15 | Ht 64.0 in | Wt 189.3 lb

## 2021-01-25 DIAGNOSIS — C7931 Secondary malignant neoplasm of brain: Secondary | ICD-10-CM | POA: Insufficient documentation

## 2021-01-25 DIAGNOSIS — C7951 Secondary malignant neoplasm of bone: Secondary | ICD-10-CM | POA: Insufficient documentation

## 2021-01-25 DIAGNOSIS — Z5111 Encounter for antineoplastic chemotherapy: Secondary | ICD-10-CM | POA: Diagnosis not present

## 2021-01-25 DIAGNOSIS — C3491 Malignant neoplasm of unspecified part of right bronchus or lung: Secondary | ICD-10-CM | POA: Diagnosis present

## 2021-01-25 DIAGNOSIS — Z79899 Other long term (current) drug therapy: Secondary | ICD-10-CM | POA: Diagnosis not present

## 2021-01-25 DIAGNOSIS — C3490 Malignant neoplasm of unspecified part of unspecified bronchus or lung: Secondary | ICD-10-CM

## 2021-01-25 DIAGNOSIS — C787 Secondary malignant neoplasm of liver and intrahepatic bile duct: Secondary | ICD-10-CM | POA: Insufficient documentation

## 2021-01-25 LAB — CMP (CANCER CENTER ONLY)
ALT: 18 U/L (ref 0–44)
AST: 28 U/L (ref 15–41)
Albumin: 3.9 g/dL (ref 3.5–5.0)
Alkaline Phosphatase: 66 U/L (ref 38–126)
Anion gap: 8 (ref 5–15)
BUN: 11 mg/dL (ref 6–20)
CO2: 24 mmol/L (ref 22–32)
Calcium: 8.9 mg/dL (ref 8.9–10.3)
Chloride: 102 mmol/L (ref 98–111)
Creatinine: 1.07 mg/dL — ABNORMAL HIGH (ref 0.44–1.00)
GFR, Estimated: 59 mL/min — ABNORMAL LOW (ref >60)
Glucose, Bld: 86 mg/dL (ref 70–99)
Potassium: 4.1 mmol/L (ref 3.5–5.1)
Sodium: 134 mmol/L — ABNORMAL LOW (ref 135–145)
Total Bilirubin: 0.5 mg/dL (ref 0.3–1.2)
Total Protein: 6.9 g/dL (ref 6.5–8.1)

## 2021-01-25 LAB — CBC WITH DIFFERENTIAL (CANCER CENTER ONLY)
Abs Immature Granulocytes: 0 10*3/uL (ref 0.00–0.07)
Basophils Absolute: 0 10*3/uL (ref 0.0–0.1)
Basophils Relative: 1 %
Eosinophils Absolute: 0 10*3/uL (ref 0.0–0.5)
Eosinophils Relative: 1 %
HCT: 40.6 % (ref 36.0–46.0)
Hemoglobin: 13.5 g/dL (ref 12.0–15.0)
Immature Granulocytes: 0 %
Lymphocytes Relative: 23 %
Lymphs Abs: 0.9 10*3/uL (ref 0.7–4.0)
MCH: 29.9 pg (ref 26.0–34.0)
MCHC: 33.3 g/dL (ref 30.0–36.0)
MCV: 90 fL (ref 80.0–100.0)
Monocytes Absolute: 0.3 10*3/uL (ref 0.1–1.0)
Monocytes Relative: 7 %
Neutro Abs: 2.9 10*3/uL (ref 1.7–7.7)
Neutrophils Relative %: 68 %
Platelet Count: 155 10*3/uL (ref 150–400)
RBC: 4.51 MIL/uL (ref 3.87–5.11)
RDW: 13 % (ref 11.5–15.5)
WBC Count: 4.1 10*3/uL (ref 4.0–10.5)
nRBC: 0 % (ref 0.0–0.2)

## 2021-01-25 NOTE — Progress Notes (Signed)
[facility] [phone]) [phone]   [phone]  OFFICE PROGRESS NOTE  Patient, No Pcp Per No address on file  DIAGNOSIS: Stage IV (T3, N2, M1c) non-small cell lung cancer, adenocarcinoma with positive EGFR mutation with deletion in exon 19 and presented with large infrahilar mass in addition to mediastinal lymphadenopathy as well as metastatic disease to the liver and bone with suspicious brain metastasis that significantly improved after the patient started treatment with Tagrisso and was not evident on the repeat MRI of the brain few days ago.  This was diagnosed in May 2021.  PRIOR THERAPY: None  CURRENT THERAPY: Tagrisso 80 mg p.o. daily.  First dose started Apr 21, 2020.  Status post 9 months of treatment.  INTERVAL HISTORY: Brittany Mccarty 60 y.o. female returns to the clinic today for follow-up visit.  The patient is feeling fine today with no concerning complaints.  She continues to tolerate her treatment with Tagrisso fairly well.  She denied having any significant chest pain, shortness of breath, cough or hemoptysis.  She has no skin rash.  She denied having any nausea, vomiting, diarrhea or constipation.  She has no significant weight loss or night sweats.  She is here today for evaluation and repeat blood work.   MEDICAL HISTORY: Past Medical History:  Diagnosis Date  . NSCL CA DX'D 04/07/20    ALLERGIES:  is allergic to other and cefdinir.  MEDICATIONS:  Current Outpatient Medications  Medication Sig Dispense Refill  . magnesium oxide (MAG-OX) 400 MG tablet Take 400 mg by mouth at bedtime.    Marland Kitchen osimertinib mesylate (TAGRISSO) 80 MG tablet Take 1 tablet (80 mg total) by mouth daily. 30 tablet 2  . oxyCODONE (OXY IR/ROXICODONE) 5 MG immediate release tablet Take 1 tablet (5 mg total) by mouth 3 (three) times daily between meals as needed for severe pain. 45 tablet 0  . benzonatate (TESSALON) 100 MG capsule Take 100 mg by mouth 3 (three) times  daily as needed for cough.  (Patient not taking: No sig reported)    . famotidine (PEPCID) 40 MG tablet Take 40 mg by mouth 2 (two) times daily. (Patient not taking: No sig reported)    . ondansetron (ZOFRAN) 4 MG tablet Take 4 mg by mouth every 8 (eight) hours as needed for nausea or vomiting.  (Patient not taking: No sig reported)     No current facility-administered medications for this visit.    SURGICAL HISTORY:  Past Surgical History:  Procedure Laterality Date  . IR REMOVAL OF PLURAL CATH W/CUFF  04/24/2020    REVIEW OF SYSTEMS:  A comprehensive review of systems was negative.   PHYSICAL EXAMINATION: General appearance: alert, cooperative and no distress Head: Normocephalic, without obvious abnormality, atraumatic Neck: no adenopathy, no JVD, supple, symmetrical, trachea midline and thyroid not enlarged, symmetric, no tenderness/mass/nodules Lymph nodes: Cervical, supraclavicular, and axillary nodes normal. Resp: clear to auscultation bilaterally Back: symmetric, no curvature. ROM normal. No CVA tenderness. Cardio: regular rate and rhythm, S1, S2 normal, no murmur, click, rub or gallop GI: soft, non-tender; bowel sounds normal; no masses,  no organomegaly Extremities: extremities normal, atraumatic, no cyanosis or edema  ECOG PERFORMANCE STATUS: 0 - Asymptomatic  Blood pressure (!) 159/98, pulse 64, temperature 97.9 F (36.6 C), temperature source Tympanic, resp. rate 15, height _0  (1.626 m), weight 189 lb 4.8 oz (85.9 kg), SpO2 100 %.  LABORATORY DATA: Lab Results  Component Value Date   WBC 4.1 01/25/2021  HGB 13.5 01/25/2021   HCT 40.6 01/25/2021   MCV 90.0 01/25/2021   PLT 155 01/25/2021      Chemistry      Component Value Date/Time   NA 136 12/12/2020 1044   K 4.7 12/12/2020 1044   CL 102 12/12/2020 1044   CO2 27 12/12/2020 1044   BUN 11 12/12/2020 1044   CREATININE 1.07 (H) 12/12/2020 1044      Component Value Date/Time   CALCIUM 9.6 12/12/2020 1044    ALKPHOS 74 12/12/2020 1044   AST 46 (H) 12/12/2020 1044   ALT 44 12/12/2020 1044   BILITOT 0.5 12/12/2020 1044       RADIOGRAPHIC STUDIES: No results found.  ASSESSMENT AND PLAN: This is a very pleasant 60 years old white female with stage IV (T3, N2, M1c) non-small cell lung cancer, adenocarcinoma with positive EGFR mutation with deletion in exon 19 and presented with large infrahilar mass in addition to mediastinal lymphadenopathy as well as metastatic disease to the liver and bone with suspicious brain metastasis that significantly improved after the patient started treatment with Tagrisso and was not evident on the repeat MRI of the brain few days ago.  This was diagnosed in May 2021. The patient is currently undergoing treatment with Tagrisso 80 mg p.o. daily started Apr 21, 2020.  She is status post 9 months of treatment. The patient has been tolerating this treatment well with no concerning adverse effects. I recommended for her to continue her current treatment with Tagrisso with the same dose. I will see her back for follow-up visit in 1 months for evaluation with repeat CT scan of the chest, abdomen pelvis for restaging of her disease. For the hypertension, the patient mentions that her blood pressure is usually normal at home.  She will continue to monitor for now. For the back pain, she will continue her current treatment with Percocet on as-needed basis. She was advised to call immediately if she has any other concerning symptoms in the interval. The patient voices understanding of current disease status and treatment options and is in agreement with the current care plan. All questions were answered. The patient knows to call the clinic with any problems, questions or concerns. We can certainly see the patient much sooner if necessary.  Disclaimer: This note was dictated with voice recognition software. Similar sounding words can inadvertently be transcribed and may not be  corrected upon review.

## 2021-02-16 ENCOUNTER — Other Ambulatory Visit: Payer: Self-pay | Admitting: Internal Medicine

## 2021-02-16 MED ORDER — OXYCODONE HCL 5 MG PO TABS
5.0000 mg | ORAL_TABLET | Freq: Three times a day (TID) | ORAL | 0 refills | Status: DC | PRN
Start: 2021-02-16 — End: 2021-04-17

## 2021-02-16 NOTE — Telephone Encounter (Signed)
For  refill

## 2021-02-20 ENCOUNTER — Other Ambulatory Visit: Payer: Self-pay

## 2021-02-20 ENCOUNTER — Inpatient Hospital Stay: Payer: 59 | Attending: Internal Medicine

## 2021-02-20 ENCOUNTER — Ambulatory Visit (HOSPITAL_COMMUNITY)
Admission: RE | Admit: 2021-02-20 | Discharge: 2021-02-20 | Disposition: A | Discharge status: Home / Self Care | Payer: 59 | Source: Ambulatory Visit | Attending: Internal Medicine | Admitting: Internal Medicine

## 2021-02-20 DIAGNOSIS — Z79899 Other long term (current) drug therapy: Secondary | ICD-10-CM | POA: Insufficient documentation

## 2021-02-20 DIAGNOSIS — C7931 Secondary malignant neoplasm of brain: Secondary | ICD-10-CM | POA: Insufficient documentation

## 2021-02-20 DIAGNOSIS — C3490 Malignant neoplasm of unspecified part of unspecified bronchus or lung: Secondary | ICD-10-CM | POA: Diagnosis present

## 2021-02-20 DIAGNOSIS — C7951 Secondary malignant neoplasm of bone: Secondary | ICD-10-CM | POA: Insufficient documentation

## 2021-02-20 DIAGNOSIS — C3491 Malignant neoplasm of unspecified part of right bronchus or lung: Secondary | ICD-10-CM | POA: Insufficient documentation

## 2021-02-20 DIAGNOSIS — C787 Secondary malignant neoplasm of liver and intrahepatic bile duct: Secondary | ICD-10-CM | POA: Insufficient documentation

## 2021-02-20 DIAGNOSIS — I10 Essential (primary) hypertension: Secondary | ICD-10-CM | POA: Insufficient documentation

## 2021-02-20 LAB — CMP (CANCER CENTER ONLY)
ALT: 29 U/L (ref 0–44)
AST: 35 U/L (ref 15–41)
Albumin: 4.1 g/dL (ref 3.5–5.0)
Alkaline Phosphatase: 68 U/L (ref 38–126)
Anion gap: 5 (ref 5–15)
BUN: 12 mg/dL (ref 8–23)
CO2: 27 mmol/L (ref 22–32)
Calcium: 9.3 mg/dL (ref 8.9–10.3)
Chloride: 100 mmol/L (ref 98–111)
Creatinine: 1.08 mg/dL — ABNORMAL HIGH (ref 0.44–1.00)
GFR, Estimated: 58 mL/min — ABNORMAL LOW (ref >60)
Glucose, Bld: 90 mg/dL (ref 70–99)
Potassium: 4.8 mmol/L (ref 3.5–5.1)
Sodium: 132 mmol/L — ABNORMAL LOW (ref 135–145)
Total Bilirubin: 0.4 mg/dL (ref 0.3–1.2)
Total Protein: 7.2 g/dL (ref 6.5–8.1)

## 2021-02-20 LAB — CBC WITH DIFFERENTIAL (CANCER CENTER ONLY)
Abs Immature Granulocytes: 0.01 10*3/uL (ref 0.00–0.07)
Basophils Absolute: 0 10*3/uL (ref 0.0–0.1)
Basophils Relative: 1 %
Eosinophils Absolute: 0 10*3/uL (ref 0.0–0.5)
Eosinophils Relative: 1 %
HCT: 42 % (ref 36.0–46.0)
Hemoglobin: 14 g/dL (ref 12.0–15.0)
Immature Granulocytes: 0 %
Lymphocytes Relative: 22 %
Lymphs Abs: 1.1 10*3/uL (ref 0.7–4.0)
MCH: 30.5 pg (ref 26.0–34.0)
MCHC: 33.3 g/dL (ref 30.0–36.0)
MCV: 91.5 fL (ref 80.0–100.0)
Monocytes Absolute: 0.3 10*3/uL (ref 0.1–1.0)
Monocytes Relative: 6 %
Neutro Abs: 3.7 10*3/uL (ref 1.7–7.7)
Neutrophils Relative %: 70 %
Platelet Count: 156 10*3/uL (ref 150–400)
RBC: 4.59 MIL/uL (ref 3.87–5.11)
RDW: 12.8 % (ref 11.5–15.5)
WBC Count: 5.2 10*3/uL (ref 4.0–10.5)
nRBC: 0 % (ref 0.0–0.2)

## 2021-02-20 MED ORDER — IOHEXOL 300 MG/ML  SOLN
100.0000 mL | Freq: Once | INTRAMUSCULAR | Status: AC | PRN
  Administered 2021-02-20: 100 mL via INTRAVENOUS

## 2021-02-22 ENCOUNTER — Other Ambulatory Visit: Payer: Self-pay

## 2021-02-22 ENCOUNTER — Inpatient Hospital Stay (HOSPITAL_BASED_OUTPATIENT_CLINIC_OR_DEPARTMENT_OTHER): Payer: 59 | Admitting: Internal Medicine

## 2021-02-22 VITALS — BP 157/83 | HR 67 | Temp 97.5°F | Resp 14 | Ht 64.0 in | Wt 188.1 lb

## 2021-02-22 DIAGNOSIS — C7951 Secondary malignant neoplasm of bone: Secondary | ICD-10-CM | POA: Diagnosis present

## 2021-02-22 DIAGNOSIS — C7931 Secondary malignant neoplasm of brain: Secondary | ICD-10-CM

## 2021-02-22 DIAGNOSIS — C3491 Malignant neoplasm of unspecified part of right bronchus or lung: Secondary | ICD-10-CM | POA: Diagnosis present

## 2021-02-22 DIAGNOSIS — I10 Essential (primary) hypertension: Secondary | ICD-10-CM | POA: Diagnosis not present

## 2021-02-22 DIAGNOSIS — C787 Secondary malignant neoplasm of liver and intrahepatic bile duct: Secondary | ICD-10-CM | POA: Diagnosis present

## 2021-02-22 DIAGNOSIS — Z5111 Encounter for antineoplastic chemotherapy: Secondary | ICD-10-CM

## 2021-02-22 DIAGNOSIS — Z79899 Other long term (current) drug therapy: Secondary | ICD-10-CM | POA: Diagnosis not present

## 2021-02-22 NOTE — Progress Notes (Signed)
Brittany Mccarty Telephone:(336) [phone]   Fax:([phone]  OFFICE PROGRESS NOTE  Patient, No Pcp Per No address on file  DIAGNOSIS: Stage IV (T3, N2, M1c) non-small cell lung cancer, adenocarcinoma with positive EGFR mutation with deletion in exon 19 and presented with large infrahilar mass in addition to mediastinal lymphadenopathy as well as metastatic disease to the liver and bone with suspicious brain metastasis that significantly improved after the patient started treatment with Tagrisso and was not evident on the repeat MRI of the brain few days ago.  This was diagnosed in May 2021.  PRIOR THERAPY: None  CURRENT THERAPY: Tagrisso 80 mg p.o. daily.  First dose started Apr 21, 2020.  Status post 10 months of treatment.  INTERVAL HISTORY: Brittany Mccarty 60 y.o. female returns to the clinic today for follow-up visit accompanied by her husband.  The patient is feeling fine today with no concerning complaints except for occasional shortness of breath with exertion and few episodes of diarrhea.  She denied having any current chest pain, cough or hemoptysis.  She denied having any nausea, vomiting, abdominal pain or constipation.  She has no headache or visual changes.  She has no significant weight loss or night sweats.  She has been tolerating her treatment with Tagrisso fairly well.  The patient had repeat CT scan of the chest, abdomen pelvis performed recently and she is here for evaluation and discussion of her scan results.  MEDICAL HISTORY: Past Medical History:  Diagnosis Date  . NSCL CA DX'D 04/07/20    ALLERGIES:  is allergic to other and cefdinir.  MEDICATIONS:  Current Outpatient Medications  Medication Sig Dispense Refill  . benzonatate (TESSALON) 100 MG capsule Take 100 mg by mouth 3 (three) times daily as needed for cough.  (Patient not taking: No sig reported)    . famotidine (PEPCID) 40 MG tablet Take 40 mg by mouth 2 (two) times daily. (Patient not  taking: No sig reported)    . magnesium oxide (MAG-OX) 400 MG tablet Take 400 mg by mouth at bedtime.    . ondansetron (ZOFRAN) 4 MG tablet Take 4 mg by mouth every 8 (eight) hours as needed for nausea or vomiting.  (Patient not taking: No sig reported)    . osimertinib mesylate (TAGRISSO) 80 MG tablet Take 1 tablet (80 mg total) by mouth daily. 30 tablet 2  . oxyCODONE (OXY IR/ROXICODONE) 5 MG immediate release tablet Take 1 tablet (5 mg total) by mouth 3 (three) times daily between meals as needed for severe pain. 45 tablet 0   No current facility-administered medications for this visit.    SURGICAL HISTORY:  Past Surgical History:  Procedure Laterality Date  . IR REMOVAL OF PLURAL CATH W/CUFF  04/24/2020    REVIEW OF SYSTEMS:  Constitutional: negative Eyes: negative Ears, nose, mouth, throat, and face: negative Respiratory: positive for dyspnea on exertion and pleurisy/chest pain Cardiovascular: negative Gastrointestinal: positive for diarrhea Genitourinary:negative Integument/breast: negative Hematologic/lymphatic: negative Musculoskeletal:positive for arthralgias Neurological: negative Behavioral/Psych: negative Endocrine: negative Allergic/Immunologic: negative   PHYSICAL EXAMINATION: General appearance: alert, cooperative and no distress Head: Normocephalic, without obvious abnormality, atraumatic Neck: no adenopathy, no JVD, supple, symmetrical, trachea midline and thyroid not enlarged, symmetric, no tenderness/mass/nodules Lymph nodes: Cervical, supraclavicular, and axillary nodes normal. Resp: clear to auscultation bilaterally Back: symmetric, no curvature. ROM normal. No CVA tenderness. Cardio: regular rate and rhythm, S1, S2 normal, no murmur, click, rub or gallop GI: soft, non-tender; bowel sounds normal; no masses,  no organomegaly Extremities: extremities normal, atraumatic, no cyanosis or edema Neurologic: Alert and oriented X 3, normal strength and tone. Normal  symmetric reflexes. Normal coordination and gait  ECOG PERFORMANCE STATUS: 0 - Asymptomatic  Blood pressure (!) 157/83, pulse 67, temperature (!) 97.5 F (36.4 C), temperature source Tympanic, resp. rate 14, height 5' 4"  (1.626 m), weight 188 lb 1.6 oz (85.3 kg), SpO2 100 %.  LABORATORY DATA: Lab Results  Component Value Date   WBC 5.2 02/20/2021   HGB 14.0 02/20/2021   HCT 42.0 02/20/2021   MCV 91.5 02/20/2021   PLT 156 02/20/2021      Chemistry      Component Value Date/Time   NA 132 (L) 02/20/2021 1344   K 4.8 02/20/2021 1344   CL 100 02/20/2021 1344   CO2 27 02/20/2021 1344   BUN 12 02/20/2021 1344   CREATININE 1.08 (H) 02/20/2021 1344      Component Value Date/Time   CALCIUM 9.3 02/20/2021 1344   ALKPHOS 68 02/20/2021 1344   AST 35 02/20/2021 1344   ALT 29 02/20/2021 1344   BILITOT 0.4 02/20/2021 1344       RADIOGRAPHIC STUDIES: CT Chest W Contrast  Result Date: 02/21/2021 CLINICAL DATA:  History of lung cancer, restaging. EXAM: CT CHEST, ABDOMEN, AND PELVIS WITH CONTRAST TECHNIQUE: Multidetector CT imaging of the chest, abdomen and pelvis was performed following the standard protocol during bolus administration of intravenous contrast. CONTRAST:  172m OMNIPAQUE IOHEXOL 300 MG/ML  SOLN COMPARISON:  Multiple priors including CT chest abdomen and pelvis December 12, 2020 FINDINGS: CT CHEST FINDINGS Cardiovascular: No thoracic aortic aneurysm. No central pulmonary embolus. Heart size within normal limits. No suspicious pericardial effusion/thickening. Mediastinum/Nodes: No significant change in the partially calcified chronic appearing lymph node along the right paratracheal area which measures 2.6 x 2.4 cm on image 20/2. No new or enlarging mediastinal, hilar or axillary lymph nodes. Unchanged right ridge shift of the mediastinum. Thyroid gland is grossly unremarkable without discrete nodularity. The trachea and esophagus are grossly unremarkable. Lungs/Pleura: Stable  appearance of the likely post radiation change/scarring in the right lower lobe. Stable right apical pleuroparenchymal scarring. Stable down like ground-glass opacity in the left upper lobe likely scarring. Similar trace right pleural effusion/thickening. No pneumothorax. Decreased size of the 5 mm nodule in the right lower lobe on image 72/6 previously measuring 7 mm. Unchanged size of the 5 mm nodule in the right upper lobe on image 54/6. Unchanged size of the 3 mm right upper lobe pulmonary nodule on image 35/6. No new or enlarging suspicious pulmonary nodules. Musculoskeletal: Continued decrease in conspicuity of the sclerotic right sternal manubrial lesion and right T7 vertebral body lesion. No new aggressive lytic or blastic lesions of bone. CT ABDOMEN PELVIS FINDINGS Hepatobiliary: No suspicious hepatic lesions. Gallbladder is grossly unremarkable. No biliary ductal dilation. Pancreas: Unremarkable. No pancreatic ductal dilatation or surrounding inflammatory changes. Spleen: Splenic granulomata. Unchanged size of the 1 cm hypoattenuating splenic lesion on image 57/2 which is likely benign. Adrenals/Urinary Tract: Adrenal glands are unremarkable. Kidneys are normal, without renal calculi, focal lesion, or hydronephrosis. Bladder is unremarkable for degree of distension. Stomach/Bowel: Radiopaque enteric contrast visualized to the level of the distal small bowel. Stomach is within normal limits. No suspicious small bowel wall thickening or dilation. Appendix and terminal ileum are unremarkable. No suspicious colonic wall thickening or mass like lesions. No evidence of bowel wall thickening, distention, or inflammatory changes. Vascular/Lymphatic: No significant vascular findings are present. No pathologically enlarged  abdominal or pelvic lymph nodes. Reproductive: Lobular appearance of the uterine contour likely representing leiomyomas. Bilateral adnexa are unremarkable. Other: No abdominopelvic ascites.  Musculoskeletal: Continued decrease in conspicuity of the sclerotic lesion involving the right iliac bone as well as the L1 and L3 vertebral bodies. No new aggressive lytic or blastic lesions of bone. Moderate lower lumbar degenerative change. IMPRESSION: 1. Continued decreased conspicuity of the sclerotic osseous metastases. 2. Overall stable size of the scattered pulmonary nodules and the partially calcified right paratracheal lymph node. 3. Stable appearance of the likely post radiation change/scarring in the right lower lobe. 4. No evidence of new metastatic disease within the abdomen or pelvis. 5. Aortic atherosclerosis.  Aortic Atherosclerosis (ICD10-I70.0). Electronically Signed   By: Dahlia Bailiff MD   On: 02/21/2021 11:58   CT Abdomen Pelvis W Contrast  Result Date: 02/21/2021 CLINICAL DATA:  History of lung cancer, restaging. EXAM: CT CHEST, ABDOMEN, AND PELVIS WITH CONTRAST TECHNIQUE: Multidetector CT imaging of the chest, abdomen and pelvis was performed following the standard protocol during bolus administration of intravenous contrast. CONTRAST:  119m OMNIPAQUE IOHEXOL 300 MG/ML  SOLN COMPARISON:  Multiple priors including CT chest abdomen and pelvis December 12, 2020 FINDINGS: CT CHEST FINDINGS Cardiovascular: No thoracic aortic aneurysm. No central pulmonary embolus. Heart size within normal limits. No suspicious pericardial effusion/thickening. Mediastinum/Nodes: No significant change in the partially calcified chronic appearing lymph node along the right paratracheal area which measures 2.6 x 2.4 cm on image 20/2. No new or enlarging mediastinal, hilar or axillary lymph nodes. Unchanged right ridge shift of the mediastinum. Thyroid gland is grossly unremarkable without discrete nodularity. The trachea and esophagus are grossly unremarkable. Lungs/Pleura: Stable appearance of the likely post radiation change/scarring in the right lower lobe. Stable right apical pleuroparenchymal scarring.  Stable down like ground-glass opacity in the left upper lobe likely scarring. Similar trace right pleural effusion/thickening. No pneumothorax. Decreased size of the 5 mm nodule in the right lower lobe on image 72/6 previously measuring 7 mm. Unchanged size of the 5 mm nodule in the right upper lobe on image 54/6. Unchanged size of the 3 mm right upper lobe pulmonary nodule on image 35/6. No new or enlarging suspicious pulmonary nodules. Musculoskeletal: Continued decrease in conspicuity of the sclerotic right sternal manubrial lesion and right T7 vertebral body lesion. No new aggressive lytic or blastic lesions of bone. CT ABDOMEN PELVIS FINDINGS Hepatobiliary: No suspicious hepatic lesions. Gallbladder is grossly unremarkable. No biliary ductal dilation. Pancreas: Unremarkable. No pancreatic ductal dilatation or surrounding inflammatory changes. Spleen: Splenic granulomata. Unchanged size of the 1 cm hypoattenuating splenic lesion on image 57/2 which is likely benign. Adrenals/Urinary Tract: Adrenal glands are unremarkable. Kidneys are normal, without renal calculi, focal lesion, or hydronephrosis. Bladder is unremarkable for degree of distension. Stomach/Bowel: Radiopaque enteric contrast visualized to the level of the distal small bowel. Stomach is within normal limits. No suspicious small bowel wall thickening or dilation. Appendix and terminal ileum are unremarkable. No suspicious colonic wall thickening or mass like lesions. No evidence of bowel wall thickening, distention, or inflammatory changes. Vascular/Lymphatic: No significant vascular findings are present. No pathologically enlarged abdominal or pelvic lymph nodes. Reproductive: Lobular appearance of the uterine contour likely representing leiomyomas. Bilateral adnexa are unremarkable. Other: No abdominopelvic ascites. Musculoskeletal: Continued decrease in conspicuity of the sclerotic lesion involving the right iliac bone as well as the L1 and L3  vertebral bodies. No new aggressive lytic or blastic lesions of bone. Moderate lower lumbar degenerative change. IMPRESSION: 1.  Continued decreased conspicuity of the sclerotic osseous metastases. 2. Overall stable size of the scattered pulmonary nodules and the partially calcified right paratracheal lymph node. 3. Stable appearance of the likely post radiation change/scarring in the right lower lobe. 4. No evidence of new metastatic disease within the abdomen or pelvis. 5. Aortic atherosclerosis.  Aortic Atherosclerosis (ICD10-I70.0). Electronically Signed   By: Dahlia Bailiff MD   On: 02/21/2021 11:58    ASSESSMENT AND PLAN: This is a very pleasant 60 years old white female with stage IV (T3, N2, M1c) non-small cell lung cancer, adenocarcinoma with positive EGFR mutation with deletion in exon 19 and presented with large infrahilar mass in addition to mediastinal lymphadenopathy as well as metastatic disease to the liver and bone with suspicious brain metastasis that significantly improved after the patient started treatment with Tagrisso and was not evident on the repeat MRI of the brain few days ago.  This was diagnosed in May 2021. The patient is currently undergoing treatment with Tagrisso 80 mg p.o. daily started Apr 21, 2020.  She is status post 10 months of treatment. The patient continues to tolerate her treatment fairly well with no concerning adverse effect except for diarrhea.  She has repeat CT scan of the chest, abdomen pelvis performed recently.  I personally and independently reviewed the scan and discussed the results with the patient and her husband. Her scan showed no concerning findings for disease progression. I recommended for the patient to continue her treatment with Tagrisso with the same dose. For the hypertension.  She mentions that her blood pressure is usually within the normal range at home but it is high today being anxious about her visit. For the back pain she will continue  her current treatment with Percocet on as-needed basis. The patient was advised to call immediately if she has any concerning symptoms in the interval. The patient voices understanding of current disease status and treatment options and is in agreement with the current care plan. All questions were answered. The patient knows to call the clinic with any problems, questions or concerns. We can certainly see the patient much sooner if necessary.  Disclaimer: This note was dictated with voice recognition software. Similar sounding words can inadvertently be transcribed and may not be corrected upon review.

## 2021-03-01 ENCOUNTER — Other Ambulatory Visit: Payer: Self-pay | Admitting: Radiation Therapy

## 2021-03-09 ENCOUNTER — Other Ambulatory Visit: Payer: Self-pay | Admitting: Medical Oncology

## 2021-03-09 DIAGNOSIS — C3491 Malignant neoplasm of unspecified part of right bronchus or lung: Secondary | ICD-10-CM

## 2021-03-09 MED ORDER — OSIMERTINIB MESYLATE 80 MG PO TABS: 80.0000 mg | ORAL_TABLET | Freq: Every day | ORAL | 2 refills | Status: DC

## 2021-03-14 ENCOUNTER — Other Ambulatory Visit: Payer: Self-pay | Admitting: Medical Oncology

## 2021-04-06 ENCOUNTER — Inpatient Hospital Stay: Payer: 59 | Attending: Internal Medicine

## 2021-04-06 ENCOUNTER — Inpatient Hospital Stay (HOSPITAL_BASED_OUTPATIENT_CLINIC_OR_DEPARTMENT_OTHER): Payer: 59 | Admitting: Internal Medicine

## 2021-04-06 ENCOUNTER — Other Ambulatory Visit: Payer: Self-pay

## 2021-04-06 VITALS — BP 130/62 | HR 62 | Temp 97.1°F | Resp 20 | Ht 64.0 in | Wt 183.6 lb

## 2021-04-06 DIAGNOSIS — Z79899 Other long term (current) drug therapy: Secondary | ICD-10-CM | POA: Insufficient documentation

## 2021-04-06 DIAGNOSIS — C7951 Secondary malignant neoplasm of bone: Secondary | ICD-10-CM | POA: Insufficient documentation

## 2021-04-06 DIAGNOSIS — C3490 Malignant neoplasm of unspecified part of unspecified bronchus or lung: Secondary | ICD-10-CM

## 2021-04-06 DIAGNOSIS — Z5111 Encounter for antineoplastic chemotherapy: Secondary | ICD-10-CM

## 2021-04-06 DIAGNOSIS — C7931 Secondary malignant neoplasm of brain: Secondary | ICD-10-CM

## 2021-04-06 DIAGNOSIS — C787 Secondary malignant neoplasm of liver and intrahepatic bile duct: Secondary | ICD-10-CM | POA: Diagnosis present

## 2021-04-06 DIAGNOSIS — C3491 Malignant neoplasm of unspecified part of right bronchus or lung: Secondary | ICD-10-CM | POA: Diagnosis not present

## 2021-04-06 LAB — CBC WITH DIFFERENTIAL (CANCER CENTER ONLY)
Abs Immature Granulocytes: 0.01 10*3/uL (ref 0.00–0.07)
Basophils Absolute: 0 10*3/uL (ref 0.0–0.1)
Basophils Relative: 1 %
Eosinophils Absolute: 0 10*3/uL (ref 0.0–0.5)
Eosinophils Relative: 1 %
HCT: 42.8 % (ref 36.0–46.0)
Hemoglobin: 14.2 g/dL (ref 12.0–15.0)
Immature Granulocytes: 0 %
Lymphocytes Relative: 26 %
Lymphs Abs: 1.1 10*3/uL (ref 0.7–4.0)
MCH: 30.1 pg (ref 26.0–34.0)
MCHC: 33.2 g/dL (ref 30.0–36.0)
MCV: 90.7 fL (ref 80.0–100.0)
Monocytes Absolute: 0.3 10*3/uL (ref 0.1–1.0)
Monocytes Relative: 7 %
Neutro Abs: 2.7 10*3/uL (ref 1.7–7.7)
Neutrophils Relative %: 65 %
Platelet Count: 155 10*3/uL (ref 150–400)
RBC: 4.72 MIL/uL (ref 3.87–5.11)
RDW: 13 % (ref 11.5–15.5)
WBC Count: 4.2 10*3/uL (ref 4.0–10.5)
nRBC: 0 % (ref 0.0–0.2)

## 2021-04-06 LAB — CMP (CANCER CENTER ONLY)
ALT: 19 U/L (ref 0–44)
AST: 28 U/L (ref 15–41)
Albumin: 4.1 g/dL (ref 3.5–5.0)
Alkaline Phosphatase: 69 U/L (ref 38–126)
Anion gap: 6 (ref 5–15)
BUN: 14 mg/dL (ref 8–23)
CO2: 29 mmol/L (ref 22–32)
Calcium: 9.7 mg/dL (ref 8.9–10.3)
Chloride: 101 mmol/L (ref 98–111)
Creatinine: 1.11 mg/dL — ABNORMAL HIGH (ref 0.44–1.00)
GFR, Estimated: 57 mL/min — ABNORMAL LOW (ref >60)
Glucose, Bld: 90 mg/dL (ref 70–99)
Potassium: 5.2 mmol/L — ABNORMAL HIGH (ref 3.5–5.1)
Sodium: 136 mmol/L (ref 135–145)
Total Bilirubin: 0.6 mg/dL (ref 0.3–1.2)
Total Protein: 7.1 g/dL (ref 6.5–8.1)

## 2021-04-06 NOTE — Progress Notes (Signed)
[place] Telephone:([phone]) [phone]   [phone]) [phone]  OFFICE PROGRESS NOTE  Patient, No Pcp Per (Inactive) No address on file  DIAGNOSIS: Stage IV (T3, N2, M1c) non-small cell lung cancer, adenocarcinoma with positive EGFR mutation with deletion in exon 19 and presented with large infrahilar mass in addition to mediastinal lymphadenopathy as well as metastatic disease to the liver and bone with suspicious brain metastasis that significantly improved after the patient started treatment with Tagrisso and was not evident on the repeat MRI of the brain few days ago.  This was diagnosed in May 2021.  PRIOR THERAPY: None  CURRENT THERAPY: Tagrisso 80 mg p.o. daily.  First dose started Apr 21, 2020.  Status post 11 months of treatment.  INTERVAL HISTORY: Brittany Mccarty 60 y.o. female returns to the clinic today for follow-up visit.  The patient is feeling fine today with no concerning complaints except for occasional diarrhea and mild skin rash.  She denied having any nausea, vomiting, abdominal pain or constipation.  She denied having any shortness of breath, cough or hemoptysis but has occasional chest pain.  She has no headache or visual changes.  She continues to tolerate her treatment with Tagrisso fairly well.  She is here today for evaluation and repeat blood work.  MEDICAL HISTORY: Past Medical History:  Diagnosis Date  . NSCL CA DX'D 04/07/20    ALLERGIES:  is allergic to other and cefdinir.  MEDICATIONS:  Current Outpatient Medications  Medication Sig Dispense Refill  . benzonatate (TESSALON) 100 MG capsule Take 100 mg by mouth 3 (three) times daily as needed for cough.  (Patient not taking: No sig reported)    . famotidine (PEPCID) 40 MG tablet Take 40 mg by mouth 2 (two) times daily. (Patient not taking: No sig reported)    . magnesium oxide (MAG-OX) 400 MG tablet Take 400 mg by mouth at bedtime.    . ondansetron (ZOFRAN) 4 MG tablet Take 4 mg by mouth  every 8 (eight) hours as needed for nausea or vomiting.  (Patient not taking: No sig reported)    . osimertinib mesylate (TAGRISSO) 80 MG tablet Take 1 tablet (80 mg total) by mouth daily. 30 tablet 2  . oxyCODONE (OXY IR/ROXICODONE) 5 MG immediate release tablet Take 1 tablet (5 mg total) by mouth 3 (three) times daily between meals as needed for severe pain. 45 tablet 0   No current facility-administered medications for this visit.    SURGICAL HISTORY:  Past Surgical History:  Procedure Laterality Date  . IR REMOVAL OF PLURAL CATH W/CUFF  04/24/2020    REVIEW OF SYSTEMS:  A comprehensive review of systems was negative except for: Gastrointestinal: positive for diarrhea Integument/breast: positive for rash   PHYSICAL EXAMINATION: General appearance: alert, cooperative and no distress Head: Normocephalic, without obvious abnormality, atraumatic Neck: no adenopathy, no JVD, supple, symmetrical, trachea midline and thyroid not enlarged, symmetric, no tenderness/mass/nodules Lymph nodes: Cervical, supraclavicular, and axillary nodes normal. Resp: clear to auscultation bilaterally Back: symmetric, no curvature. ROM normal. No CVA tenderness. Cardio: regular rate and rhythm, S1, S2 normal, no murmur, click, rub or gallop GI: soft, non-tender; bowel sounds normal; no masses,  no organomegaly Extremities: extremities normal, atraumatic, no cyanosis or edema  ECOG PERFORMANCE STATUS: 0 - Asymptomatic  Blood pressure 130/62, pulse 62, temperature (!) 97.1 F (36.2 C), temperature source Tympanic, resp. rate 20, height 5' 4"  (1.626 m), weight 183 lb 9.6 oz (83.3 kg), SpO2 100 %.  LABORATORY  DATA: Lab Results  Component Value Date   WBC 4.2 04/06/2021   HGB 14.2 04/06/2021   HCT 42.8 04/06/2021   MCV 90.7 04/06/2021   PLT 155 04/06/2021      Chemistry      Component Value Date/Time   NA 132 (L) 02/20/2021 1344   K 4.8 02/20/2021 1344   CL 100 02/20/2021 1344   CO2 27 02/20/2021  1344   BUN 12 02/20/2021 1344   CREATININE 1.08 (H) 02/20/2021 1344      Component Value Date/Time   CALCIUM 9.3 02/20/2021 1344   ALKPHOS 68 02/20/2021 1344   AST 35 02/20/2021 1344   ALT 29 02/20/2021 1344   BILITOT 0.4 02/20/2021 1344       RADIOGRAPHIC STUDIES: No results found.  ASSESSMENT AND PLAN: This is a very pleasant 60 years old white female with stage IV (T3, N2, M1c) non-small cell lung cancer, adenocarcinoma with positive EGFR mutation with deletion in exon 19 and presented with large infrahilar mass in addition to mediastinal lymphadenopathy as well as metastatic disease to the liver and bone with suspicious brain metastasis that significantly improved after the patient started treatment with Tagrisso and was not evident on the repeat MRI of the brain few days ago.  This was diagnosed in May 2021. The patient is currently undergoing treatment with Tagrisso 80 mg p.o. daily started Apr 21, 2020.  She is status post 11 months of treatment. The patient has been tolerating her treatment fairly well with no concerning adverse effect except for occasional skin rash and diarrhea. She is here today for blood work and her CBC today is unremarkable.  Comprehensive metabolic panel is still pending. I recommended for the patient to continue her current treatment with Tagrisso with the same dose. I will see her back for follow-up visit in 6 weeks for evaluation with repeat CT scan of the chest, abdomen pelvis for restaging of her disease. The patient was advised to call immediately if she has any concerning symptoms in the interval. For the back pain she will continue her current treatment with Percocet on as-needed basis.  The patient voices understanding of current disease status and treatment options and is in agreement with the current care plan. All questions were answered. The patient knows to call the clinic with any problems, questions or concerns. We can certainly see the  patient much sooner if necessary.  Disclaimer: This note was dictated with voice recognition software. Similar sounding words can inadvertently be transcribed and may not be corrected upon review.

## 2021-04-07 ENCOUNTER — Telehealth: Payer: Self-pay | Admitting: Internal Medicine

## 2021-04-07 NOTE — Telephone Encounter (Signed)
Scheduled per los. Called and spoke with patient. Confirmed appt 

## 2021-04-17 ENCOUNTER — Other Ambulatory Visit: Payer: Self-pay | Admitting: Internal Medicine

## 2021-04-18 NOTE — Telephone Encounter (Signed)
refill 

## 2021-04-19 MED ORDER — OXYCODONE HCL 5 MG PO TABS: 5.0000 mg | ORAL_TABLET | Freq: Three times a day (TID) | ORAL | 0 refills | Status: DC | PRN

## 2021-05-12 ENCOUNTER — Ambulatory Visit
Admission: RE | Admit: 2021-05-12 | Discharge: 2021-05-12 | Disposition: A | Discharge status: Home / Self Care | Payer: 59 | Source: Ambulatory Visit | Attending: Internal Medicine | Admitting: Internal Medicine

## 2021-05-12 ENCOUNTER — Other Ambulatory Visit: Payer: Self-pay

## 2021-05-12 DIAGNOSIS — C7931 Secondary malignant neoplasm of brain: Secondary | ICD-10-CM

## 2021-05-12 MED ORDER — GADOBENATE DIMEGLUMINE 529 MG/ML IV SOLN
15.0000 mL | Freq: Once | INTRAVENOUS | Status: AC | PRN
  Administered 2021-05-12: 15 mL via INTRAVENOUS

## 2021-05-15 ENCOUNTER — Other Ambulatory Visit: Payer: Self-pay

## 2021-05-15 ENCOUNTER — Inpatient Hospital Stay: Payer: 59

## 2021-05-15 ENCOUNTER — Inpatient Hospital Stay: Payer: 59 | Attending: Internal Medicine | Admitting: Internal Medicine

## 2021-05-15 VITALS — BP 149/95 | HR 66 | Temp 97.9°F | Resp 18 | Wt 186.5 lb

## 2021-05-15 DIAGNOSIS — C787 Secondary malignant neoplasm of liver and intrahepatic bile duct: Secondary | ICD-10-CM | POA: Diagnosis present

## 2021-05-15 DIAGNOSIS — C3491 Malignant neoplasm of unspecified part of right bronchus or lung: Secondary | ICD-10-CM | POA: Diagnosis present

## 2021-05-15 DIAGNOSIS — C7931 Secondary malignant neoplasm of brain: Secondary | ICD-10-CM | POA: Insufficient documentation

## 2021-05-15 DIAGNOSIS — C7951 Secondary malignant neoplasm of bone: Secondary | ICD-10-CM | POA: Insufficient documentation

## 2021-05-15 DIAGNOSIS — C3490 Malignant neoplasm of unspecified part of unspecified bronchus or lung: Secondary | ICD-10-CM

## 2021-05-15 LAB — CMP (CANCER CENTER ONLY)
ALT: 35 U/L (ref 0–44)
AST: 39 U/L (ref 15–41)
Albumin: 3.9 g/dL (ref 3.5–5.0)
Alkaline Phosphatase: 80 U/L (ref 38–126)
Anion gap: 8 (ref 5–15)
BUN: 12 mg/dL (ref 8–23)
CO2: 28 mmol/L (ref 22–32)
Calcium: 9.6 mg/dL (ref 8.9–10.3)
Chloride: 100 mmol/L (ref 98–111)
Creatinine: 1.04 mg/dL — ABNORMAL HIGH (ref 0.44–1.00)
GFR, Estimated: >60 mL/min (ref >60)
Glucose, Bld: 91 mg/dL (ref 70–99)
Potassium: 4.7 mmol/L (ref 3.5–5.1)
Sodium: 136 mmol/L (ref 135–145)
Total Bilirubin: 0.6 mg/dL (ref 0.3–1.2)
Total Protein: 7.1 g/dL (ref 6.5–8.1)

## 2021-05-15 LAB — CBC WITH DIFFERENTIAL (CANCER CENTER ONLY)
Abs Immature Granulocytes: 0.01 10*3/uL (ref 0.00–0.07)
Basophils Absolute: 0 10*3/uL (ref 0.0–0.1)
Basophils Relative: 1 %
Eosinophils Absolute: 0 10*3/uL (ref 0.0–0.5)
Eosinophils Relative: 0 %
HCT: 40.1 % (ref 36.0–46.0)
Hemoglobin: 13.6 g/dL (ref 12.0–15.0)
Immature Granulocytes: 0 %
Lymphocytes Relative: 19 %
Lymphs Abs: 1.2 10*3/uL (ref 0.7–4.0)
MCH: 30.8 pg (ref 26.0–34.0)
MCHC: 33.9 g/dL (ref 30.0–36.0)
MCV: 90.7 fL (ref 80.0–100.0)
Monocytes Absolute: 0.4 10*3/uL (ref 0.1–1.0)
Monocytes Relative: 7 %
Neutro Abs: 4.5 10*3/uL (ref 1.7–7.7)
Neutrophils Relative %: 73 %
Platelet Count: 152 10*3/uL (ref 150–400)
RBC: 4.42 MIL/uL (ref 3.87–5.11)
RDW: 12.9 % (ref 11.5–15.5)
WBC Count: 6.2 10*3/uL (ref 4.0–10.5)
nRBC: 0 % (ref 0.0–0.2)

## 2021-05-15 NOTE — Progress Notes (Signed)
[facility], [place] [address] [phone]   Interval Evaluation  Date of Service: 05/15/21 Patient Name: Brittany Mccarty Patient MRN: [id number] Patient DOB: [birth year] Provider: Ventura Sellers, MD  Identifying Statement:  Brittany Mccarty is a 60 y.o. female with Brain metastases ([place]) [C79.31]   Primary Cancer: NSCLC stage IV adenocarcinoma  Oncologic History:  Interval History:  Brittany Mccarty presents today for follow up after recent MRI brain.  No new or progressive deficits.  Continues to experience sporadic tension headaches.  Continues on [place] with Dr. Julien Nordmann without issue.  H+P (05/13/20) Patient presents today to discuss abnormal brain MRI.  She underwent CNS imaging as a screening measure after diagnosis of NSCLC; this demonstrated 2 small regions possibly c/w metastases.  Lung cancer has been complicated by recurrent pleural effusions and pneumothorax, as well as subsegmental PE.  She is currently on Eliquis twice per day.  She has also started Tagrisso as of 04/20/20, ten days prior to most recent MRI.  Does describe one episode 5 years ago of brief numbness of the left arm and face which resolved on its own.  Workup was performed by a neurologist which was unremarkable.  Today she describes no neurologic deficits, no seizures, no headaches. Recently completed 3T MRI for possible SRS planning.  Medications: Current Outpatient Medications on File Prior to Visit  Medication Sig Dispense Refill   magnesium oxide (MAG-OX) 400 MG tablet Take 400 mg by mouth at bedtime.     osimertinib mesylate (TAGRISSO) 80 MG tablet Take 1 tablet (80 mg total) by mouth daily. 30 tablet 2   oxyCODONE (OXY IR/ROXICODONE) 5 MG immediate release tablet Take 1 tablet (5 mg total) by mouth 3 (three) times daily between meals as needed for severe pain. 45 tablet 0   benzonatate (TESSALON) 100 MG capsule Take 100 mg by mouth  3 (three) times daily as needed for cough.  (Patient not taking: No sig reported)     famotidine (PEPCID) 40 MG tablet Take 40 mg by mouth 2 (two) times daily. (Patient not taking: No sig reported)     ondansetron (ZOFRAN) 4 MG tablet Take 4 mg by mouth every 8 (eight) hours as needed for nausea or vomiting.  (Patient not taking: No sig reported)     No current facility-administered medications on file prior to visit.    Allergies:  Allergies  Allergen Reactions   Other     Blood Products   Cefdinir Rash   Past Medical History:  Past Medical History:  Diagnosis Date   NSCL CA DX'D 04/07/20   Past Surgical History:  Past Surgical History:  Procedure Laterality Date   IR REMOVAL OF PLURAL CATH W/CUFF  04/24/2020   Social History:  Social History   Socioeconomic History   Marital status: Married    Spouse name: Not on file   Number of children: Not on file   Years of education: Not on file   Highest education level: Not on file  Occupational History   Not on file  Tobacco Use   Smoking status: Never   Smokeless tobacco: Never  Substance and Sexual Activity   Alcohol use: Not Currently   Drug use: Never   Sexual activity: Not on file  Other Topics Concern   Not on file  Social History Narrative   Not on file   Social Determinants of Health   Financial Resource Strain: Not on file  Food  Insecurity: Not on file  Transportation Needs: Not on file  Physical Activity: Not on file  Stress: Not on file  Social Connections: Not on file  Intimate Partner Violence: Not on file   Family History: No family history on file.  Review of Systems: Constitutional: Doesn't report fevers, chills or abnormal weight loss Eyes: Doesn't report blurriness of vision Ears, nose, mouth, throat, and face: Doesn't report sore throat Respiratory: Doesn't report cough, dyspnea or wheezes Cardiovascular: Doesn't report palpitation, chest discomfort  Gastrointestinal:  Doesn't report nausea,  constipation, diarrhea GU: Doesn't report incontinence Skin: Doesn't report skin rashes Neurological: Per HPI Musculoskeletal: Doesn't report joint pain Behavioral/Psych: Doesn't report anxiety  Physical Exam:   Vitals with BMI 05/15/2021 04/06/2021 02/22/2021  Height - 5\' 4"  5\' 4"   Weight 186 lbs 8 oz 183 lbs 10 oz 188 lbs 2 oz  BMI - 73.7 10.62  Systolic 694 854 627  Diastolic 95 62 83  Pulse 66 62 67   KPS: 90. General: Alert, cooperative, pleasant, in no acute distress Head: Normal EENT: No conjunctival injection or scleral icterus.  Lungs: Resp effort normal Cardiac: Regular rate Abdomen: chest tube in place Skin: No rashes cyanosis or petechiae. Extremities: No clubbing or edema  Neurologic Exam: Mental Status: Awake, alert, attentive to examiner. Oriented to self and environment. Language is fluent with intact comprehension.  Cranial Nerves: Visual acuity is grossly normal. Visual fields are full. Extra-ocular movements intact. No ptosis. Face is symmetric Motor: Tone and bulk are normal. Power is full in both arms and legs. Reflexes are symmetric, no pathologic reflexes present.  Sensory: Intact to light touch Gait: Normal.   Labs: I have reviewed the data as listed    Component Value Date/Time   NA 136 05/15/2021 1049   K 4.7 05/15/2021 1049   CL 100 05/15/2021 1049   CO2 28 05/15/2021 1049   GLUCOSE 91 05/15/2021 1049   BUN 12 05/15/2021 1049   CREATININE 1.04 (H) 05/15/2021 1049   CALCIUM 9.6 05/15/2021 1049   PROT 7.1 05/15/2021 1049   ALBUMIN 3.9 05/15/2021 1049   AST 39 05/15/2021 1049   ALT 35 05/15/2021 1049   ALKPHOS 80 05/15/2021 1049   BILITOT 0.6 05/15/2021 1049   GFRNONAA >60 05/15/2021 1049   GFRAA >60 08/15/2020 1033   Lab Results  Component Value Date   WBC 6.2 05/15/2021   NEUTROABS 4.5 05/15/2021   HGB 13.6 05/15/2021   HCT 40.1 05/15/2021   MCV 90.7 05/15/2021   PLT 152 05/15/2021   Imaging:  [facility] Clinician Interpretation: I  have personally reviewed the CNS images as listed.  My interpretation, in the context of the patient's clinical presentation, is progressive disease  MR BRAIN W WO CONTRAST  Addendum Date: 05/15/2021   ADDENDUM REPORT: 05/15/2021 08:53 ADDENDUM: This case was discussed at brain cancer conference. The ring-enhancing lesion initially seen on 04/19/2020 quite likely did represent a metastasis. The patient was begun on systemic therapy 2 days later and we believe that there was a prominent response to treatment evident on the study done 10 days later, 04/29/2020. Therefore, the recurrent enhancement in this location is felt most likely to represent recurrent disease at the site of a previous metastasis. Similarly, enhancement along the gyral surface in the right parietal region is strongly favored to represent metastatic disease. Electronically Signed   By: Nelson Chimes M.D.   On: 05/15/2021 08:53   Result Date: 05/15/2021 CLINICAL DATA:  History of lung cancer with metastatic  disease. Assess treatment response. EXAM: MRI HEAD WITHOUT AND WITH CONTRAST TECHNIQUE: Multiplanar, multiecho pulse sequences of the brain and surrounding structures were obtained without and with intravenous contrast. CONTRAST:  57mL MULTIHANCE GADOBENATE DIMEGLUMINE 529 MG/ML IV SOLN COMPARISON:  08/12/2020.  04/29/2020. FINDINGS: Brain: Diffusion imaging does not show any acute or subacute infarction. Minimal small vessel change affects the pons and cerebral hemispheres. Moderate chronic small-vessel ischemic changes noted within the cerebral hemispheric white matter. No cortical or large vessel territory infarction. No hemorrhage, hydrocephalus or extra-axial collection. Adjacent to the anterior body of the left lateral ventricle, there is recurrence of a small amount of smudgy enhancement. This had not been seen on the last 2 examinations. On the study of 04/19/2020, there was a 6 mm ring enhancing focus in that location which no  longer showed enhancement 10 days later, leading impression at that time to be that of a subacute infarction. Along the surface of a gyrus in the right parietal region, axial image 140, 139 and 138 on the postcontrast studies, there is newly seen enhancement. This is worrisome for a new metastatic lesion. Vascular: Major vessels at the base of the brain show flow. Skull and upper cervical spine: Negative Sinuses/Orbits: Clear/normal Other: Tiny amount of fluid in the mastoid air cells. IMPRESSION: New focus of enhancement along the gyral surface in the right parietal region towards the vertex, image 139, measuring about 4 mm in size, worrisome for a new metastasis. New or recurrent 2-3 mm focus of enhancement in the deep white matter adjacent to the body of the left lateral ventricle axial image 126. I think this is at the same site that there was a ring-enhancing lesion on 04/19/2020 that no longer enhanced 10 days later. This was described does not infarction because of that, but recurrence of enhancement in this location is of some concern. Moderate chronic small-vessel ischemic changes of the hemispheric white matter. Electronically Signed: By: Nelson Chimes M.D. On: 05/12/2021 15:58     Assessment/Plan Brain metastases ([facility])   Glorimar Stroope is clinically stable today.  MRI demonstrates two very small foci of enhancement likely c/w metastases.  It is likely these have been controlled over time by Tagrisso, in particular the left frontal lesion, which regressed quickly once the drug was initiated last year.    We did ultimately recommend radiosurgery for these lesions after dicussion in brain/spine tumor board.  The patient was agreeable with this plan, preferring it over the alternative, which was continued MRI surveillance.   Tension headaches are unlikely related to CNS changes observed today.  She has systemic restaging scheduled for tomorrow.  We ask that Mylena Sedberry return to clinic  in 3 months following SRS with brain MRI, or sooner as needed.  We appreciate the opportunity to participate in the care of [facility].   All questions were answered. The patient knows to call the clinic with any problems, questions or concerns. No barriers to learning were detected.  I have spent a total of 30 minutes of face-to-face and non-face-to-face time, excluding clinical staff time, preparing to see patient, ordering tests and/or medications, counseling the patient, and independently interpreting results and communicating results to the patient/family/caregiver    Ventura Sellers, MD Medical Director of Neuro-Oncology [facility] at [place] 05/15/21 11:38 AM

## 2021-05-15 NOTE — Progress Notes (Signed)
Location/Histology of Brain Tumor: Right Parietal Lobe,   Patient presented for screening scans due to diagnosis of NSCLC.  CT CAP 05/16/2021:  MRI Brain 05/12/2021: New focus of enhancement along the gyral surface in the right parietal region towards the vertex, measuring about 4 mm in size, worrisome for a new metastasis.  New or recurrent 2-3 mm focus of enhancement in the deep white matter adjacent to the body of the left lateral ventricle axial.  Addendum 05/15/2021 The ring-enhancing lesion initially seen on 04/19/2020 quite likely did represent a metastasis. The patient was begun on systemic therapy 2 days later and we believe that there was a prominent response to treatment evident on the study done 10 days later, 04/29/2020. Therefore, the recurrent enhancement in this location is felt most likely to represent recurrent disease at the site of a previous metastasis. Similarly, enhancement along the gyral surface in the right parietal region is strongly favored to represent metastatic disease.   Past or anticipated interventions, if any, per neurosurgery:   Past or anticipated interventions, if any, per medical oncology:  Dr. Mickeal Skinner 05/15/2021 Wells Guiles Hoshino is clinically and radiographically stable today.   -MRI does not re-demonstrate suspected metastases.   -Suspicion for metastasis for this lesion remains reasonable, given potential efficacy of Tagrisso and CNS penetrance. -We ask that Vonette Grosso return to clinic in 9 months following next brain MRI, or sooner as needed.    Dose of Decadron, if applicable: no  Recent neurologic symptoms, if any:  Seizures:  Headaches:  Nausea:  Dizziness/ataxia:  Difficulty with hand coordination:  Focal numbness/weakness:  Visual deficits/changes:  Confusion/Memory deficits:    SAFETY ISSUES: Prior radiation? no Pacemaker/ICD? no Possible current pregnancy? Postmenopausal Is the patient on methotrexate?  no  Additional Complaints / other details:

## 2021-05-16 ENCOUNTER — Ambulatory Visit
Admission: RE | Admit: 2021-05-16 | Discharge: 2021-05-16 | Disposition: A | Discharge status: Home / Self Care | Payer: 59 | Source: Ambulatory Visit | Attending: Radiation Oncology | Admitting: Radiation Oncology

## 2021-05-16 ENCOUNTER — Other Ambulatory Visit: Payer: 59

## 2021-05-16 ENCOUNTER — Ambulatory Visit (HOSPITAL_COMMUNITY)
Admission: RE | Admit: 2021-05-16 | Discharge: 2021-05-16 | Disposition: A | Discharge status: Home / Self Care | Payer: 59 | Source: Ambulatory Visit | Attending: Internal Medicine | Admitting: Internal Medicine

## 2021-05-16 ENCOUNTER — Ambulatory Visit: Payer: 59

## 2021-05-16 DIAGNOSIS — C7931 Secondary malignant neoplasm of brain: Secondary | ICD-10-CM

## 2021-05-16 DIAGNOSIS — C3491 Malignant neoplasm of unspecified part of right bronchus or lung: Secondary | ICD-10-CM

## 2021-05-16 DIAGNOSIS — C3490 Malignant neoplasm of unspecified part of unspecified bronchus or lung: Secondary | ICD-10-CM

## 2021-05-16 MED ORDER — IOHEXOL 300 MG/ML  SOLN
100.0000 mL | Freq: Once | INTRAMUSCULAR | Status: AC | PRN
  Administered 2021-05-16: 100 mL via INTRAVENOUS

## 2021-05-16 MED ORDER — SODIUM CHLORIDE (PF) 0.9 % IJ SOLN
INTRAMUSCULAR | Status: AC
  Filled 2021-05-16: qty 50

## 2021-05-16 NOTE — Progress Notes (Signed)
Radiation Oncology         ([phone]) [phone] ________________________________  Name: Brittany Mccarty        MRN: [id number]  Date of Service: 05/16/2021 DOB: 11-05-1961  CB:[id number], No Pcp Per (Inactive)  Vaslow, Acey Lav, MD     REFERRING PHYSICIAN: Ventura Sellers, MD   DIAGNOSIS: The primary encounter diagnosis was Brain metastases [facility]). A diagnosis of Adenocarcinoma of lung, right ([place]) was also pertinent to this visit.   HISTORY OF PRESENT ILLNESS: Brittany Mccarty is a 60 y.o. female originally seen at the request of Dr. 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 She developed shortness of breath while on vacation in [place], and she presented to [facility] and was worked up and found to have a right effusion. 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 She came home and a family member recommended she go to [facility]. She did present but has decided that care is too far in [place]. She did have a PET scan and brain MRI, both of which had not been reviewed by the ordering team at [facility] but a prescription was called in for [place] and she started taking this. For this reason as well she's decided to relocate care closer to home. On her PET, there are concerns for disease in the chest, regional thoracic nodes, multiple bone metastases including ribs, L1 and L3 lumbar spine, iliac bones, and sternum. She has liver disease, and MRI brain revealed at least one metastastatic lesion, 80mm in greatest dimension in the frontal lobe, possibly a second.  I contacted her last week prior to her discharge to introduce our department to her.  We discussed the possibility of using stereotactic radiosurgery to treat her brain metastasis, and recommended that she undergo a 3T MRI of the brain.  She did have this performed on 04/29/2020 which interestingly revealed resolution of the concern that was previously seen, and in  retrospect the area was felt to be most consistent with subacute infarct.    Since her last visit she has continued with Tagrisso and is has been on this regimen for the last year.  She is scheduled for restaging scans this week.  She met back with Dr. Mickeal Skinner and a repeat MRI was recommended and performed on 05/12/2021, the 2 to 3 mm focus of enhancement in the deep white matter adjacent to the body of the left lateral ventricle was again seen, as well as a new focus about 4 mm in the gyral surface in the right parietal lobe.  Her case was discussed in multidisciplinary brain oncology conference and it was felt that it would be appropriate at this point to intervene and give her stereotactic radiosurgery [facility]) to these sites.  She is seen today to discuss [place] treatment on MyChart.   PREVIOUS RADIATION THERAPY: No   PAST MEDICAL HISTORY:  Past Medical History:  Diagnosis Date   NSCL CA DX'D 04/07/20       PAST SURGICAL HISTORY: Past Surgical History:  Procedure Laterality Date   IR REMOVAL OF PLURAL CATH W/CUFF  04/24/2020     FAMILY HISTORY: No family history on file.   SOCIAL HISTORY:  reports that she has never smoked. She has never used smokeless tobacco. She reports previous alcohol use. She reports that she does not use drugs.  The patient is married and lives in [place].      ALLERGIES: Other and Cefdinir   MEDICATIONS:  Current  Outpatient Medications  Medication Sig Dispense Refill   benzonatate (TESSALON) 100 MG capsule Take 100 mg by mouth 3 (three) times daily as needed for cough.  (Patient not taking: No sig reported)     famotidine (PEPCID) 40 MG tablet Take 40 mg by mouth 2 (two) times daily. (Patient not taking: No sig reported)     magnesium oxide (MAG-OX) 400 MG tablet Take 400 mg by mouth at bedtime.     ondansetron (ZOFRAN) 4 MG tablet Take 4 mg by mouth every 8 (eight) hours as needed for nausea or vomiting.  (Patient not taking: No sig reported)      osimertinib mesylate (TAGRISSO) 80 MG tablet Take 1 tablet (80 mg total) by mouth daily. 30 tablet 2   oxyCODONE (OXY IR/ROXICODONE) 5 MG immediate release tablet Take 1 tablet (5 mg total) by mouth 3 (three) times daily between meals as needed for severe pain. 45 tablet 0   No current facility-administered medications for this encounter.     REVIEW OF SYSTEMS: On review of systems the patient reports that she is starting to feel very well. She is not having any trouble with shortness of breath or chest pain.  She states that she is not having any headaches, unexpected changes in her hearing or speech, movement or vision.  No other complaints are verbalized.     PHYSICAL EXAM:  Wt Readings from Last 3 Encounters:  05/17/21 184 lb (83.5 kg)  05/15/21 186 lb 8 oz (84.6 kg)  04/06/21 183 lb 9.6 oz (83.3 kg)  Otherwise vitals unavailable due to encounter type.  In general this is a well appearing Caucasian female in no acute distress.  She's alert and oriented x4 and appropriate throughout the examination. Cardiopulmonary assessment is negative for acute distress and she exhibits normal effort.    ECOG = 0  0 - Asymptomatic (Fully active, able to carry on all predisease activities without restriction)  1 - Symptomatic but completely ambulatory (Restricted in physically strenuous activity but ambulatory and able to carry out work of a light or sedentary nature. For example, light housework, office work)  2 - Symptomatic, <50% in bed during the day (Ambulatory and capable of all self care but unable to carry out any work activities. Up and about more than 50% of waking hours)  3 - Symptomatic, >50% in bed, but not bedbound (Capable of only limited self-care, confined to bed or chair 50% or more of waking hours)  4 - Bedbound (Completely disabled. Cannot carry on any self-care. Totally confined to bed or chair)  5 - Death   Eustace Pen MM, Creech RH, Tormey DC, et al. [phone]). "Toxicity and  response criteria of the [facility]". [place] Oncol. 5 (6): 649-55    LABORATORY DATA:  Lab Results  Component Value Date   WBC 6.2 05/15/2021   HGB 13.6 05/15/2021   HCT 40.1 05/15/2021   MCV 90.7 05/15/2021   PLT 152 05/15/2021   Lab Results  Component Value Date   NA 136 05/15/2021   K 4.7 05/15/2021   CL 100 05/15/2021   CO2 28 05/15/2021   Lab Results  Component Value Date   ALT 35 05/15/2021   AST 39 05/15/2021   ALKPHOS 80 05/15/2021   BILITOT 0.6 05/15/2021      RADIOGRAPHY: MR BRAIN W WO CONTRAST  Addendum Date: 05/15/2021   ADDENDUM REPORT: 05/15/2021 08:53 ADDENDUM: This case was discussed at brain cancer conference. The ring-enhancing lesion  initially seen on 04/19/2020 quite likely did represent a metastasis. The patient was begun on systemic therapy 2 days later and we believe that there was a prominent response to treatment evident on the study done 10 days later, 04/29/2020. Therefore, the recurrent enhancement in this location is felt most likely to represent recurrent disease at the site of a previous metastasis. Similarly, enhancement along the gyral surface in the right parietal region is strongly favored to represent metastatic disease. Electronically Signed   By: Nelson Chimes M.D.   On: 05/15/2021 08:53   Result Date: 05/15/2021 CLINICAL DATA:  History of lung cancer with metastatic disease. Assess treatment response. EXAM: MRI HEAD WITHOUT AND WITH CONTRAST TECHNIQUE: Multiplanar, multiecho pulse sequences of the brain and surrounding structures were obtained without and with intravenous contrast. CONTRAST:  58mL MULTIHANCE GADOBENATE DIMEGLUMINE 529 MG/ML IV SOLN COMPARISON:  08/12/2020.  04/29/2020. FINDINGS: Brain: Diffusion imaging does not show any acute or subacute infarction. Minimal small vessel change affects the pons and cerebral hemispheres. Moderate chronic small-vessel ischemic changes noted within the cerebral  hemispheric white matter. No cortical or large vessel territory infarction. No hemorrhage, hydrocephalus or extra-axial collection. Adjacent to the anterior body of the left lateral ventricle, there is recurrence of a small amount of smudgy enhancement. This had not been seen on the last 2 examinations. On the study of 04/19/2020, there was a 6 mm ring enhancing focus in that location which no longer showed enhancement 10 days later, leading impression at that time to be that of a subacute infarction. Along the surface of a gyrus in the right parietal region, axial image 140, 139 and 138 on the postcontrast studies, there is newly seen enhancement. This is worrisome for a new metastatic lesion. Vascular: Major vessels at the base of the brain show flow. Skull and upper cervical spine: Negative Sinuses/Orbits: Clear/normal Other: Tiny amount of fluid in the mastoid air cells. IMPRESSION: New focus of enhancement along the gyral surface in the right parietal region towards the vertex, image 139, measuring about 4 mm in size, worrisome for a new metastasis. New or recurrent 2-3 mm focus of enhancement in the deep white matter adjacent to the body of the left lateral ventricle axial image 126. I think this is at the same site that there was a ring-enhancing lesion on 04/19/2020 that no longer enhanced 10 days later. This was described does not infarction because of that, but recurrence of enhancement in this location is of some concern. Moderate chronic small-vessel ischemic changes of the hemispheric white matter. Electronically Signed: By: Nelson Chimes M.D. On: 05/12/2021 15:58        IMPRESSION/PLAN: 1. 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 Dr. Lisbeth Renshaw discusses the patient's course and treatment to date. Her case was discussed yesterday in brain oncology conference. She appears to be a good candidate for stereotactic  radiosurgery [facility]). Dr. Lisbeth Renshaw outlines the process to coordinate including meeting with neurosurgery, and simulation.  We discussed the risks, benefits, short, and long term effects of radiotherapy, as well as the curative intent, and the patient is interested in proceeding. Dr. Lisbeth Renshaw discusses the delivery and logistics of radiotherapy and anticipates a course of single fraction SRS. She will simulate tomorrow at which time she will sign written consent, and proceed with treatment on 05/24/21. She will also continue Tagrisso with Dr. Julien Nordmann.  This encounter was provided by telemedicine platform MyChart though this converted  to telephone visit due to technical difficulties.  The patient has provided two factor identification and has given verbal consent for this type of encounter and has been advised to only accept a meeting of this type in a secure network environment. The time spent during this encounter was 45 minutes including preparation, discussion, and coordination of the patient's care. The attendants for this meeting include Blenda Nicely, RN, Dr. Lisbeth Renshaw, Hayden Pedro  and Cathrine Muster.  During the encounter,  Blenda Nicely, RN, Dr. Lisbeth Renshaw, and Hayden Pedro were located at [facility] Radiation Oncology Department.  Brittany Mccarty was located at home.    The above documentation reflects my direct findings during this shared patient visit. Please see the separate note by Dr. Lisbeth Renshaw on this date for the remainder of the patient's plan of care.    Carola Rhine, PAC

## 2021-05-17 ENCOUNTER — Other Ambulatory Visit: Payer: Self-pay

## 2021-05-17 ENCOUNTER — Ambulatory Visit
Admission: RE | Admit: 2021-05-17 | Discharge: 2021-05-17 | Disposition: A | Discharge status: Home / Self Care | Payer: 59 | Source: Ambulatory Visit | Attending: Radiation Oncology | Admitting: Radiation Oncology

## 2021-05-17 VITALS — HR 61 | Resp 16 | Ht 64.0 in | Wt 184.0 lb

## 2021-05-17 DIAGNOSIS — C787 Secondary malignant neoplasm of liver and intrahepatic bile duct: Secondary | ICD-10-CM | POA: Diagnosis not present

## 2021-05-17 DIAGNOSIS — C7931 Secondary malignant neoplasm of brain: Secondary | ICD-10-CM | POA: Diagnosis present

## 2021-05-17 DIAGNOSIS — C3491 Malignant neoplasm of unspecified part of right bronchus or lung: Secondary | ICD-10-CM | POA: Diagnosis present

## 2021-05-17 DIAGNOSIS — C7951 Secondary malignant neoplasm of bone: Secondary | ICD-10-CM | POA: Diagnosis not present

## 2021-05-17 DIAGNOSIS — Z510 Encounter for antineoplastic radiation therapy: Secondary | ICD-10-CM | POA: Insufficient documentation

## 2021-05-17 MED ORDER — SODIUM CHLORIDE 0.9% FLUSH
10.0000 mL | Freq: Once | INTRAVENOUS | Status: AC
Start: 2021-05-17 — End: 2021-05-17
  Administered 2021-05-17: 10 mL via INTRAVENOUS

## 2021-05-17 NOTE — Progress Notes (Signed)
Has armband been applied?  Yes.    Does patient have an allergy to IV contrast dye?: No.   Has patient ever received premedication for IV contrast dye?: No.   Does patient take metformin?: No.  If patient does take metformin when was the last dose: No  Date of lab work: 05/15/2021 BUN: 12 CR: 1.04 eGfr: >60  IV site: LAC  Has IV site been added to flowsheet?  Yes

## 2021-05-18 ENCOUNTER — Inpatient Hospital Stay (HOSPITAL_BASED_OUTPATIENT_CLINIC_OR_DEPARTMENT_OTHER): Payer: 59 | Admitting: Internal Medicine

## 2021-05-18 ENCOUNTER — Encounter: Payer: Self-pay | Admitting: Internal Medicine

## 2021-05-18 VITALS — BP 169/92 | HR 63 | Temp 98.0°F | Resp 18 | Ht 64.0 in | Wt 183.3 lb

## 2021-05-18 DIAGNOSIS — C3491 Malignant neoplasm of unspecified part of right bronchus or lung: Secondary | ICD-10-CM

## 2021-05-18 DIAGNOSIS — C7931 Secondary malignant neoplasm of brain: Secondary | ICD-10-CM

## 2021-05-18 DIAGNOSIS — Z5111 Encounter for antineoplastic chemotherapy: Secondary | ICD-10-CM

## 2021-05-18 NOTE — Progress Notes (Signed)
[place] [phone]   [phone]  OFFICE PROGRESS NOTE  Patient, No Pcp Per (Inactive) No address on file  DIAGNOSIS: Stage IV (T3, N2, M1c) non-small cell lung cancer, adenocarcinoma with positive EGFR mutation with deletion in exon 19 and presented with large infrahilar mass in addition to mediastinal lymphadenopathy as well as metastatic disease to the liver and bone with suspicious brain metastasis that significantly improved after the patient started treatment with Tagrisso and was not evident on the repeat MRI of the brain few days ago.  This was diagnosed in May 2021.  PRIOR THERAPY: None  CURRENT THERAPY: Tagrisso 80 mg p.o. daily.  First dose started Apr 21, 2020.  Status post 13 months of treatment.  INTERVAL HISTORY: Brittany Mccarty 60 y.o. female returns to the clinic today for follow-up visit.  The patient is feeling fine today with no concerning complaints except for anxiety about her scan results and her blood pressure is high today.  It is usually normal at home.  She denied having any chest pain, shortness of breath, cough or hemoptysis.  She denied having any fever or chills.  She has no nausea, vomiting, diarrhea or constipation.  She has no headache or visual changes.  She continues to tolerate her treatment with Tagrisso fairly well.  The patient had repeat CT scan of the chest, abdomen pelvis as well as MRI of the brain performed recently and she is here for evaluation and discussion of her scan results and treatment options.   MEDICAL HISTORY: Past Medical History:  Diagnosis Date   NSCL CA DX'D 04/07/20    ALLERGIES:  is allergic to other and cefdinir.  MEDICATIONS:  Current Outpatient Medications  Medication Sig Dispense Refill   benzonatate (TESSALON) 100 MG capsule Take 100 mg by mouth 3 (three) times daily as needed for cough.  (Patient not taking: No sig reported)     famotidine (PEPCID) 40 MG tablet Take 40 mg  by mouth 2 (two) times daily. (Patient not taking: No sig reported)     magnesium oxide (MAG-OX) 400 MG tablet Take 400 mg by mouth at bedtime.     ondansetron (ZOFRAN) 4 MG tablet Take 4 mg by mouth every 8 (eight) hours as needed for nausea or vomiting.  (Patient not taking: No sig reported)     osimertinib mesylate (TAGRISSO) 80 MG tablet Take 1 tablet (80 mg total) by mouth daily. 30 tablet 2   oxyCODONE (OXY IR/ROXICODONE) 5 MG immediate release tablet Take 1 tablet (5 mg total) by mouth 3 (three) times daily between meals as needed for severe pain. 45 tablet 0   No current facility-administered medications for this visit.    SURGICAL HISTORY:  Past Surgical History:  Procedure Laterality Date   IR REMOVAL OF PLURAL CATH W/CUFF  04/24/2020    REVIEW OF SYSTEMS:  Constitutional: positive for fatigue Eyes: negative Ears, nose, mouth, throat, and face: negative Respiratory: negative Cardiovascular: negative Gastrointestinal: negative Genitourinary:negative Integument/breast: negative Hematologic/lymphatic: negative Musculoskeletal:negative Neurological: negative Behavioral/Psych: negative Endocrine: negative Allergic/Immunologic: negative   PHYSICAL EXAMINATION: General appearance: alert, cooperative, and no distress Head: Normocephalic, without obvious abnormality, atraumatic Neck: no adenopathy, no JVD, supple, symmetrical, trachea midline, and thyroid not enlarged, symmetric, no tenderness/mass/nodules Lymph nodes: Cervical, supraclavicular, and axillary nodes normal. Resp: clear to auscultation bilaterally Back: symmetric, no curvature. ROM normal. No CVA tenderness. Cardio: regular rate and rhythm, S1, S2 normal, no murmur, click, rub or gallop GI: soft, non-tender;  bowel sounds normal; no masses,  no organomegaly Extremities: extremities normal, atraumatic, no cyanosis or edema Neurologic: Alert and oriented X 3, normal strength and tone. Normal symmetric reflexes.  Normal coordination and gait  ECOG PERFORMANCE STATUS: 0 - Asymptomatic  Blood pressure (!) 169/92, pulse 63, temperature 98 F (36.7 C), temperature source Tympanic, resp. rate 18, height 5' 4" (1.626 m), weight 183 lb 4.8 oz (83.1 kg), SpO2 100 %.  LABORATORY DATA: Lab Results  Component Value Date   WBC 6.2 05/15/2021   HGB 13.6 05/15/2021   HCT 40.1 05/15/2021   MCV 90.7 05/15/2021   PLT 152 05/15/2021      Chemistry      Component Value Date/Time   NA 136 05/15/2021 1049   K 4.7 05/15/2021 1049   CL 100 05/15/2021 1049   CO2 28 05/15/2021 1049   BUN 12 05/15/2021 1049   CREATININE 1.04 (H) 05/15/2021 1049      Component Value Date/Time   CALCIUM 9.6 05/15/2021 1049   ALKPHOS 80 05/15/2021 1049   AST 39 05/15/2021 1049   ALT 35 05/15/2021 1049   BILITOT 0.6 05/15/2021 1049       RADIOGRAPHIC STUDIES: CT Chest W Contrast  Result Date: 05/17/2021 CLINICAL DATA:  Non-small-cell lung cancer.  Restaging. EXAM: CT CHEST, ABDOMEN, AND PELVIS WITH CONTRAST TECHNIQUE: Multidetector CT imaging of the chest, abdomen and pelvis was performed following the standard protocol during bolus administration of intravenous contrast. CONTRAST:  16m OMNIPAQUE IOHEXOL 300 MG/ML  SOLN COMPARISON:  02/20/2021 FINDINGS: CT CHEST FINDINGS Cardiovascular: The heart size is normal. No substantial pericardial effusion. No thoracic aortic aneurysm. Mediastinum/Nodes: No substantial change in the 2.6 x 2.5 cm peripherally calcified right paratracheal node (26/3) compared to 2.6 x 2.4 cm previously. No mediastinal lymphadenopathy. There is no hilar lymphadenopathy. There is no axillary lymphadenopathy. Lungs/Pleura: Stable volume loss right hemithorax. Interstitial and nodular opacity in the right apex is probably scarring, stable in the interval. Tiny right pulmonary nodules identified previously are stable in the interval on images 74/7 (4 mm) and 92/7 (4 mm). Retro hilar scarring in the right base is  unchanged. Stable scarring peripheral left upper lobe with calcified granulomas noted in the left upper lobe. No focal airspace consolidation. There is no evidence of pleural effusion. Musculoskeletal: Subtle sclerotic right sternal manubrial lesion and right T7 vertebral lesion described previously are not readily evident today. CT ABDOMEN PELVIS FINDINGS Hepatobiliary: No suspicious focal abnormality within the liver parenchyma. There is no evidence for gallstones, gallbladder wall thickening, or pericholecystic fluid. No intrahepatic or extrahepatic biliary dilation. Pancreas: No focal mass lesion. No dilatation of the main duct. No intraparenchymal cyst. No peripancreatic edema. Spleen: 10 mm hypodensity in the inferior spleen is stable in the interval. Adrenals/Urinary Tract: No adrenal nodule or mass. Kidneys unremarkable. No evidence for hydroureter. The urinary bladder appears normal for the degree of distention. Stomach/Bowel: Stomach is unremarkable. No gastric wall thickening. No evidence of outlet obstruction. Duodenum is normally positioned as is the ligament of Treitz. No small bowel wall thickening. No small bowel dilatation. No gross colonic mass. No colonic wall thickening. Vascular/Lymphatic: No abdominal aortic aneurysm. There is no gastrohepatic or hepatoduodenal ligament lymphadenopathy. No retroperitoneal or mesenteric lymphadenopathy. No pelvic sidewall lymphadenopathy. Reproductive: The uterus is unremarkable.  There is no adnexal mass. Other: No intraperitoneal free fluid. Musculoskeletal: Stable appearance subtle right iliac bone lesion with similar subtle sclerotic lesions in the L1 and L3 vertebral bodies. IMPRESSION: 1. Stable exam. No  new or progressive interval findings to raise concern for worsening disease. 2. Previously described tiny pulmonary nodules are stable in the interval. No new suspicious pulmonary nodule or mass. 3. Areas of scarring in both lungs including dominant right  retro hilar scarring volume loss is stable. 4. Previously identified subtle sclerotic lesions in the lumbar spine and right iliac bone are similar. Electronically Signed   By: Misty Stanley M.D.   On: 05/17/2021 12:34   MR BRAIN W WO CONTRAST  Addendum Date: 05/15/2021   ADDENDUM REPORT: 05/15/2021 08:53 ADDENDUM: This case was discussed at brain cancer conference. The ring-enhancing lesion initially seen on 04/19/2020 quite likely did represent a metastasis. The patient was begun on systemic therapy 2 days later and we believe that there was a prominent response to treatment evident on the study done 10 days later, 04/29/2020. Therefore, the recurrent enhancement in this location is felt most likely to represent recurrent disease at the site of a previous metastasis. Similarly, enhancement along the gyral surface in the right parietal region is strongly favored to represent metastatic disease. Electronically Signed   By: Nelson Chimes M.D.   On: 05/15/2021 08:53   Result Date: 05/15/2021 CLINICAL DATA:  History of lung cancer with metastatic disease. Assess treatment response. EXAM: MRI HEAD WITHOUT AND WITH CONTRAST TECHNIQUE: Multiplanar, multiecho pulse sequences of the brain and surrounding structures were obtained without and with intravenous contrast. CONTRAST:  86m MULTIHANCE GADOBENATE DIMEGLUMINE 529 MG/ML IV SOLN COMPARISON:  08/12/2020.  04/29/2020. FINDINGS: Brain: Diffusion imaging does not show any acute or subacute infarction. Minimal small vessel change affects the pons and cerebral hemispheres. Moderate chronic small-vessel ischemic changes noted within the cerebral hemispheric white matter. No cortical or large vessel territory infarction. No hemorrhage, hydrocephalus or extra-axial collection. Adjacent to the anterior body of the left lateral ventricle, there is recurrence of a small amount of smudgy enhancement. This had not been seen on the last 2 examinations. On the study of 04/19/2020,  there was a 6 mm ring enhancing focus in that location which no longer showed enhancement 10 days later, leading impression at that time to be that of a subacute infarction. Along the surface of a gyrus in the right parietal region, axial image 140, 139 and 138 on the postcontrast studies, there is newly seen enhancement. This is worrisome for a new metastatic lesion. Vascular: Major vessels at the base of the brain show flow. Skull and upper cervical spine: Negative Sinuses/Orbits: Clear/normal Other: Tiny amount of fluid in the mastoid air cells. IMPRESSION: New focus of enhancement along the gyral surface in the right parietal region towards the vertex, image 139, measuring about 4 mm in size, worrisome for a new metastasis. New or recurrent 2-3 mm focus of enhancement in the deep white matter adjacent to the body of the left lateral ventricle axial image 126. I think this is at the same site that there was a ring-enhancing lesion on 04/19/2020 that no longer enhanced 10 days later. This was described does not infarction because of that, but recurrence of enhancement in this location is of some concern. Moderate chronic small-vessel ischemic changes of the hemispheric white matter. Electronically Signed: By: MNelson ChimesM.D. On: 05/12/2021 15:58   CT Abdomen Pelvis W Contrast  Result Date: 05/17/2021 CLINICAL DATA:  Non-small-cell lung cancer.  Restaging. EXAM: CT CHEST, ABDOMEN, AND PELVIS WITH CONTRAST TECHNIQUE: Multidetector CT imaging of the chest, abdomen and pelvis was performed following the standard protocol during bolus administration of  intravenous contrast. CONTRAST:  142m OMNIPAQUE IOHEXOL 300 MG/ML  SOLN COMPARISON:  02/20/2021 FINDINGS: CT CHEST FINDINGS Cardiovascular: The heart size is normal. No substantial pericardial effusion. No thoracic aortic aneurysm. Mediastinum/Nodes: No substantial change in the 2.6 x 2.5 cm peripherally calcified right paratracheal node (26/3) compared to 2.6 x  2.4 cm previously. No mediastinal lymphadenopathy. There is no hilar lymphadenopathy. There is no axillary lymphadenopathy. Lungs/Pleura: Stable volume loss right hemithorax. Interstitial and nodular opacity in the right apex is probably scarring, stable in the interval. Tiny right pulmonary nodules identified previously are stable in the interval on images 74/7 (4 mm) and 92/7 (4 mm). Retro hilar scarring in the right base is unchanged. Stable scarring peripheral left upper lobe with calcified granulomas noted in the left upper lobe. No focal airspace consolidation. There is no evidence of pleural effusion. Musculoskeletal: Subtle sclerotic right sternal manubrial lesion and right T7 vertebral lesion described previously are not readily evident today. CT ABDOMEN PELVIS FINDINGS Hepatobiliary: No suspicious focal abnormality within the liver parenchyma. There is no evidence for gallstones, gallbladder wall thickening, or pericholecystic fluid. No intrahepatic or extrahepatic biliary dilation. Pancreas: No focal mass lesion. No dilatation of the main duct. No intraparenchymal cyst. No peripancreatic edema. Spleen: 10 mm hypodensity in the inferior spleen is stable in the interval. Adrenals/Urinary Tract: No adrenal nodule or mass. Kidneys unremarkable. No evidence for hydroureter. The urinary bladder appears normal for the degree of distention. Stomach/Bowel: Stomach is unremarkable. No gastric wall thickening. No evidence of outlet obstruction. Duodenum is normally positioned as is the ligament of Treitz. No small bowel wall thickening. No small bowel dilatation. No gross colonic mass. No colonic wall thickening. Vascular/Lymphatic: No abdominal aortic aneurysm. There is no gastrohepatic or hepatoduodenal ligament lymphadenopathy. No retroperitoneal or mesenteric lymphadenopathy. No pelvic sidewall lymphadenopathy. Reproductive: The uterus is unremarkable.  There is no adnexal mass. Other: No intraperitoneal free  fluid. Musculoskeletal: Stable appearance subtle right iliac bone lesion with similar subtle sclerotic lesions in the L1 and L3 vertebral bodies. IMPRESSION: 1. Stable exam. No new or progressive interval findings to raise concern for worsening disease. 2. Previously described tiny pulmonary nodules are stable in the interval. No new suspicious pulmonary nodule or mass. 3. Areas of scarring in both lungs including dominant right retro hilar scarring volume loss is stable. 4. Previously identified subtle sclerotic lesions in the lumbar spine and right iliac bone are similar. Electronically Signed   By: EMisty StanleyM.D.   On: 05/17/2021 12:34    ASSESSMENT AND PLAN: This is a very pleasant 60years old white female with stage IV (T3, N2, M1c) non-small cell lung cancer, adenocarcinoma with positive EGFR mutation with deletion in exon 19 and presented with large infrahilar mass in addition to mediastinal lymphadenopathy as well as metastatic disease to the liver and bone with suspicious brain metastasis that significantly improved after the patient started treatment with Tagrisso and was not evident on the repeat MRI of the brain few days ago.  This was diagnosed in May 2021. The patient is currently undergoing treatment with Tagrisso 80 mg p.o. daily started Apr 21, 2020.  She is status post 13 months of treatment. She continues to tolerate this treatment well with no concerning adverse effects. The patient had repeat CT scan of the chest, abdomen pelvis performed recently.  I personally and independently reviewed the scan images and discussed the results with the patient today. Her scan showed no concerning findings for systemic disease progression. MRI of the  brain showed new tiny brain lesion and the patient was seen by Dr. Mickeal Skinner and referred to radiation oncology for consideration of SRS to these lesions. I will see her back for follow-up visit in 2 months for evaluation and repeat blood work. For the  chronic back pain, she will continue her current treatment with Percocet on as-needed basis. The patient was advised to call immediately if she has any concerning symptoms in the interval.  The patient voices understanding of current disease status and treatment options and is in agreement with the current care plan. All questions were answered. The patient knows to call the clinic with any problems, questions or concerns. We can certainly see the patient much sooner if necessary.  Disclaimer: This note was dictated with voice recognition software. Similar sounding words can inadvertently be transcribed and may not be corrected upon review.

## 2021-05-19 ENCOUNTER — Telehealth: Payer: Self-pay | Admitting: Internal Medicine

## 2021-05-19 DIAGNOSIS — Z510 Encounter for antineoplastic radiation therapy: Secondary | ICD-10-CM | POA: Diagnosis not present

## 2021-05-19 NOTE — Telephone Encounter (Signed)
Sch per 6/14 los, pt aware

## 2021-05-24 ENCOUNTER — Other Ambulatory Visit: Payer: Self-pay

## 2021-05-24 ENCOUNTER — Encounter: Payer: Self-pay | Admitting: Radiation Oncology

## 2021-05-24 ENCOUNTER — Ambulatory Visit
Admission: RE | Admit: 2021-05-24 | Discharge: 2021-05-24 | Disposition: A | Discharge status: Home / Self Care | Payer: 59 | Source: Ambulatory Visit | Attending: Radiation Oncology | Admitting: Radiation Oncology

## 2021-05-24 DIAGNOSIS — Z510 Encounter for antineoplastic radiation therapy: Secondary | ICD-10-CM | POA: Diagnosis not present

## 2021-05-24 NOTE — Op Note (Signed)
  Name: Brittany Mccarty  MRN: [id number]  Date: 05/24/2021   DOB: 1961-03-26  Stereotactic Radiosurgery Operative Note  PRE-OPERATIVE DIAGNOSIS:  Multiple Brain Metastases  POST-OPERATIVE DIAGNOSIS:  Multiple Brain Metastases  PROCEDURE:  Stereotactic Radiosurgery  SURGEON:  Jairo Ben, MD  RADIATION ONCOLOGIST: Dr. Kyung Rudd, MD  NARRATIVE: The patient underwent a radiation treatment planning session in the radiation oncology simulation suite under the care of the radiation oncology physician and physicist.  I participated closely in the radiation treatment planning afterwards. The patient underwent planning CT which was fused to 3T high resolution MRI with 1 mm axial slices.  These images were fused on the planning system.  We contoured the gross target volumes and subsequently expanded this to yield the Planning Target Volume. I actively participated in the planning process.  I helped to define and review the target contours and also the contours of the optic pathway, eyes, brainstem and selected nearby organs at risk.  All the dose constraints for critical structures were reviewed and compared to AAPM Task Group 101.  The prescription dose conformity was reviewed.  I approved the plan electronically.    Accordingly, Brittany Mccarty was brought to the TrueBeam stereotactic radiation treatment linac and placed in the custom immobilization mask.  The patient was aligned according to the IR fiducial markers with BrainLab Exactrac, then orthogonal x-rays were used in ExacTrac with the 6DOF robotic table and the shifts were made to align the patient  Brittany Mccarty received stereotactic radiosurgery uneventfully.    Lesions treated:  2  Left frontal peri-ventricular - 20Gy Right posterior frontal - 20Gy  Complex lesions treated:  0 (>3.5 cm, <52mm of optic path, or within the brainstem)   The detailed description of the procedure is recorded in the radiation oncology  procedure note.  I was present for the duration of the procedure.  DISPOSITION:  Following delivery, the patient was transported to nursing in stable condition and monitored for possible acute effects to be discharged to home in stable condition with follow-up in one month.  Jairo Ben, MD 05/24/2021 1:13 PM

## 2021-05-24 NOTE — Progress Notes (Signed)
Nurse monitoring complete status post 1 of 1 SRS treatments. Patient without complaints. Patient denies new or worsening neurologic symptoms. Vitals stable (BP elevated, but patient denies any symptoms and states it seems to always be elevated at the doctors office. She reports she checks her BP regularly at home and states readings are WNL). Instructed patient to avoid strenuous activity for the next 24 hours. Instructed patient to call [phone] with needs related to treatment after hours or over the weekend. Patient verbalized understanding. Ambulated out of clinic unassisted to family member's vehicle without incident  Vitals:   05/24/21 1315  BP: (!) 151/100  Pulse: 79  Resp: 20  Temp: 97.8 F (36.6 C)  SpO2: 100%

## 2021-05-31 NOTE — Progress Notes (Signed)
                                                                                                                                                             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   Plan: The patient will receive a call in about one month from the radiation oncology department. She will continue follow up with Dr. Mickeal Skinner as well.   ________________________________________________    Carola Rhine, [facility]

## 2021-06-07 ENCOUNTER — Other Ambulatory Visit: Payer: Self-pay | Admitting: Radiation Therapy

## 2021-06-08 ENCOUNTER — Other Ambulatory Visit: Payer: Self-pay | Admitting: *Deleted

## 2021-06-08 DIAGNOSIS — C3491 Malignant neoplasm of unspecified part of right bronchus or lung: Secondary | ICD-10-CM

## 2021-06-08 MED ORDER — OSIMERTINIB MESYLATE 80 MG PO TABS: 80.0000 mg | ORAL_TABLET | Freq: Every day | ORAL | 2 refills | Status: DC

## 2021-06-26 ENCOUNTER — Other Ambulatory Visit: Payer: Self-pay | Admitting: Internal Medicine

## 2021-06-27 MED ORDER — OXYCODONE HCL 5 MG PO TABS: 5.0000 mg | ORAL_TABLET | Freq: Three times a day (TID) | ORAL | 0 refills | Status: DC | PRN

## 2021-06-29 NOTE — Progress Notes (Signed)
  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 During treatment she did very well with radiotherapy and did not have significant desquamation.    Impression/Plan: 1. 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  I couldn't reach the patient but I left a message and discussed that we would be happy to continue to follow her as needed, but she will also continue to follow up with Dr. Julien Nordmann in medical oncology and Dr. Mickeal Skinner in Neuro Oncology in the brain oncology program    Carola Rhine, [facility]

## 2021-07-10 ENCOUNTER — Inpatient Hospital Stay: Payer: 59 | Attending: Internal Medicine | Admitting: Internal Medicine

## 2021-07-10 ENCOUNTER — Ambulatory Visit
Admission: RE | Admit: 2021-07-10 | Discharge: 2021-07-10 | Disposition: A | Discharge status: Home / Self Care | Payer: 59 | Source: Ambulatory Visit | Attending: Radiation Oncology | Admitting: Radiation Oncology

## 2021-07-10 ENCOUNTER — Other Ambulatory Visit: Payer: Self-pay

## 2021-07-10 ENCOUNTER — Ambulatory Visit (HOSPITAL_COMMUNITY)
Admission: RE | Admit: 2021-07-10 | Discharge: 2021-07-10 | Disposition: A | Discharge status: Home / Self Care | Payer: 59 | Source: Ambulatory Visit | Attending: Internal Medicine | Admitting: Internal Medicine

## 2021-07-10 ENCOUNTER — Ambulatory Visit: Payer: 59 | Admitting: Physician Assistant

## 2021-07-10 ENCOUNTER — Other Ambulatory Visit: Payer: Self-pay | Admitting: Internal Medicine

## 2021-07-10 ENCOUNTER — Inpatient Hospital Stay: Payer: 59

## 2021-07-10 VITALS — BP 159/92 | HR 73 | Temp 97.9°F | Resp 20 | Ht 64.0 in | Wt 178.9 lb

## 2021-07-10 DIAGNOSIS — C3491 Malignant neoplasm of unspecified part of right bronchus or lung: Secondary | ICD-10-CM | POA: Insufficient documentation

## 2021-07-10 DIAGNOSIS — C787 Secondary malignant neoplasm of liver and intrahepatic bile duct: Secondary | ICD-10-CM | POA: Diagnosis present

## 2021-07-10 DIAGNOSIS — C7931 Secondary malignant neoplasm of brain: Secondary | ICD-10-CM | POA: Insufficient documentation

## 2021-07-10 DIAGNOSIS — Z79899 Other long term (current) drug therapy: Secondary | ICD-10-CM | POA: Insufficient documentation

## 2021-07-10 DIAGNOSIS — C3490 Malignant neoplasm of unspecified part of unspecified bronchus or lung: Secondary | ICD-10-CM

## 2021-07-10 DIAGNOSIS — Z5111 Encounter for antineoplastic chemotherapy: Secondary | ICD-10-CM

## 2021-07-10 DIAGNOSIS — C7951 Secondary malignant neoplasm of bone: Secondary | ICD-10-CM | POA: Diagnosis present

## 2021-07-10 DIAGNOSIS — R059 Cough, unspecified: Secondary | ICD-10-CM | POA: Diagnosis not present

## 2021-07-10 DIAGNOSIS — R0602 Shortness of breath: Secondary | ICD-10-CM | POA: Insufficient documentation

## 2021-07-10 LAB — CMP (CANCER CENTER ONLY)
ALT: 22 U/L (ref 0–44)
AST: 30 U/L (ref 15–41)
Albumin: 3.9 g/dL (ref 3.5–5.0)
Alkaline Phosphatase: 77 U/L (ref 38–126)
Anion gap: 9 (ref 5–15)
BUN: 12 mg/dL (ref 8–23)
CO2: 27 mmol/L (ref 22–32)
Calcium: 9.9 mg/dL (ref 8.9–10.3)
Chloride: 102 mmol/L (ref 98–111)
Creatinine: 1.11 mg/dL — ABNORMAL HIGH (ref 0.44–1.00)
GFR, Estimated: 57 mL/min — ABNORMAL LOW (ref >60)
Glucose, Bld: 94 mg/dL (ref 70–99)
Potassium: 4.9 mmol/L (ref 3.5–5.1)
Sodium: 138 mmol/L (ref 135–145)
Total Bilirubin: 0.5 mg/dL (ref 0.3–1.2)
Total Protein: 6.9 g/dL (ref 6.5–8.1)

## 2021-07-10 LAB — CBC WITH DIFFERENTIAL (CANCER CENTER ONLY)
Abs Immature Granulocytes: 0.01 10*3/uL (ref 0.00–0.07)
Basophils Absolute: 0 10*3/uL (ref 0.0–0.1)
Basophils Relative: 1 %
Eosinophils Absolute: 0 10*3/uL (ref 0.0–0.5)
Eosinophils Relative: 0 %
HCT: 40 % (ref 36.0–46.0)
Hemoglobin: 13.9 g/dL (ref 12.0–15.0)
Immature Granulocytes: 0 %
Lymphocytes Relative: 24 %
Lymphs Abs: 0.9 10*3/uL (ref 0.7–4.0)
MCH: 31 pg (ref 26.0–34.0)
MCHC: 34.8 g/dL (ref 30.0–36.0)
MCV: 89.3 fL (ref 80.0–100.0)
Monocytes Absolute: 0.4 10*3/uL (ref 0.1–1.0)
Monocytes Relative: 10 %
Neutro Abs: 2.6 10*3/uL (ref 1.7–7.7)
Neutrophils Relative %: 65 %
Platelet Count: 148 10*3/uL — ABNORMAL LOW (ref 150–400)
RBC: 4.48 MIL/uL (ref 3.87–5.11)
RDW: 12.4 % (ref 11.5–15.5)
WBC Count: 3.9 10*3/uL — ABNORMAL LOW (ref 4.0–10.5)
nRBC: 0 % (ref 0.0–0.2)

## 2021-07-10 NOTE — Progress Notes (Signed)
[facility] [phone]   [phone]  OFFICE PROGRESS NOTE  Patient, No Pcp Per (Inactive) No address on file  DIAGNOSIS: Stage IV (T3, N2, M1c) non-small cell lung cancer, adenocarcinoma with positive EGFR mutation with deletion in exon 19 and presented with large infrahilar mass in addition to mediastinal lymphadenopathy as well as metastatic disease to the liver and bone with suspicious brain metastasis that significantly improved after the patient started treatment with Tagrisso and was not evident on the repeat MRI of the brain few days ago.  This was diagnosed in May 2021.  PRIOR THERAPY: None  CURRENT THERAPY: Tagrisso 80 mg p.o. daily.  First dose started Apr 21, 2020.  Status post 14.5 months of treatment.  INTERVAL HISTORY: Brittany Mccarty 60 y.o. female returns to the clinic today for follow-up visit accompanied by her husband.  The patient is feeling fine today with no concerning complaints except for new cough mainly dry with shortness of breath with exertion.  She denied having any chest pain or hemoptysis.  She denied having any fever or chills.  She has no nausea, vomiting, diarrhea or constipation.  She denied having any headache or visual changes.  She continues to tolerate her treatment with Tagrisso fairly well.  She is here today for evaluation and repeat blood work.    MEDICAL HISTORY: Past Medical History:  Diagnosis Date   NSCL CA DX'D 04/07/20    ALLERGIES:  is allergic to other and cefdinir.  MEDICATIONS:  Current Outpatient Medications  Medication Sig Dispense Refill   benzonatate (TESSALON) 100 MG capsule Take 100 mg by mouth 3 (three) times daily as needed for cough.     famotidine (PEPCID) 40 MG tablet Take 40 mg by mouth 2 (two) times daily.     magnesium oxide (MAG-OX) 400 MG tablet Take 400 mg by mouth at bedtime.     ondansetron (ZOFRAN) 4 MG tablet Take 4 mg by mouth every 8 (eight) hours as needed for  nausea or vomiting.  (Patient not taking: No sig reported)     osimertinib mesylate (TAGRISSO) 80 MG tablet Take 1 tablet (80 mg total) by mouth daily. 30 tablet 2   oxyCODONE (OXY IR/ROXICODONE) 5 MG immediate release tablet Take 1 tablet (5 mg total) by mouth 3 (three) times daily between meals as needed for severe pain. 45 tablet 0   No current facility-administered medications for this visit.    SURGICAL HISTORY:  Past Surgical History:  Procedure Laterality Date   IR REMOVAL OF PLURAL CATH W/CUFF  04/24/2020    REVIEW OF SYSTEMS:  A comprehensive review of systems was negative except for: Respiratory: positive for cough and dyspnea on exertion   PHYSICAL EXAMINATION: General appearance: alert, cooperative, and no distress Head: Normocephalic, without obvious abnormality, atraumatic Neck: no adenopathy, no JVD, supple, symmetrical, trachea midline, and thyroid not enlarged, symmetric, no tenderness/mass/nodules Lymph nodes: Cervical, supraclavicular, and axillary nodes normal. Resp: clear to auscultation bilaterally Back: symmetric, no curvature. ROM normal. No CVA tenderness. Cardio: regular rate and rhythm, S1, S2 normal, no murmur, click, rub or gallop GI: soft, non-tender; bowel sounds normal; no masses,  no organomegaly Extremities: extremities normal, atraumatic, no cyanosis or edema  ECOG PERFORMANCE STATUS: 0 - Asymptomatic  Blood pressure (!) 159/92, pulse 73, temperature 97.9 F (36.6 C), temperature source Tympanic, resp. rate 20, height 5' 4" (1.626 m), weight 178 lb 14.4 oz (81.1 kg), SpO2 100 %.  LABORATORY DATA: Lab Results  Component Value Date   WBC 3.9 (L) 07/10/2021   HGB 13.9 07/10/2021   HCT 40.0 07/10/2021   MCV 89.3 07/10/2021   PLT 148 (L) 07/10/2021      Chemistry      Component Value Date/Time   NA 136 05/15/2021 1049   K 4.7 05/15/2021 1049   CL 100 05/15/2021 1049   CO2 28 05/15/2021 1049   BUN 12 05/15/2021 1049   CREATININE 1.04 (H)  05/15/2021 1049      Component Value Date/Time   CALCIUM 9.6 05/15/2021 1049   ALKPHOS 80 05/15/2021 1049   AST 39 05/15/2021 1049   ALT 35 05/15/2021 1049   BILITOT 0.6 05/15/2021 1049       RADIOGRAPHIC STUDIES: No results found.  ASSESSMENT AND PLAN: This is a very pleasant 60 years old white female with stage IV (T3, N2, M1c) non-small cell lung cancer, adenocarcinoma with positive EGFR mutation with deletion in exon 19 and presented with large infrahilar mass in addition to mediastinal lymphadenopathy as well as metastatic disease to the liver and bone with suspicious brain metastasis that significantly improved after the patient started treatment with Tagrisso and was not evident on the repeat MRI of the brain few days ago.  This was diagnosed in May 2021. The patient is currently undergoing treatment with Tagrisso 80 mg p.o. daily started Apr 21, 2020.  She is status post 14.5 months of treatment. She has been tolerating her treatment well with no concerning adverse effect except for recent cough and shortness of breath with exertion. She denied having any skin rash or diarrhea. I recommended for the patient to continue her current treatment with Tagrisso with the same dose for now. I will see her back for follow-up visit in 6 weeks for evaluation with repeat CT scan of the chest, abdomen pelvis for restaging of her disease. For the new cough and shortness of breath, I will order chest x-ray today and if concerning we will repeat the imaging studies with staging CT scan of the chest, abdomen pelvis sooner.  The patient was also advised to use Delsym over-the-counter as a cough suppressant. She was advised to call immediately if she has any other concerning symptoms in the interval. The patient voices understanding of current disease status and treatment options and is in agreement with the current care plan. All questions were answered. The patient knows to call the clinic with any  problems, questions or concerns. We can certainly see the patient much sooner if necessary.  Disclaimer: This note was dictated with voice recognition software. Similar sounding words can inadvertently be transcribed and may not be corrected upon review.

## 2021-07-11 ENCOUNTER — Other Ambulatory Visit: Payer: Self-pay | Admitting: Internal Medicine

## 2021-07-11 ENCOUNTER — Ambulatory Visit (HOSPITAL_COMMUNITY)
Admission: RE | Admit: 2021-07-11 | Discharge: 2021-07-11 | Disposition: A | Discharge status: Home / Self Care | Payer: 59 | Source: Ambulatory Visit | Attending: Internal Medicine | Admitting: Internal Medicine

## 2021-07-11 DIAGNOSIS — C3491 Malignant neoplasm of unspecified part of right bronchus or lung: Secondary | ICD-10-CM

## 2021-07-11 NOTE — Progress Notes (Signed)
Patient ID: Brittany Mccarty, female   DOB: 13-Nov-1961, 60 y.o.   MRN: [id number] Pt presented to [place] dept today for right thoracentesis. On limited US of both left and right pleural spaces there is no significant effusion noted. Procedure cancelled. Pt notified.

## 2021-07-12 ENCOUNTER — Telehealth: Payer: Self-pay | Admitting: Medical Oncology

## 2021-07-12 ENCOUNTER — Other Ambulatory Visit: Payer: Self-pay | Admitting: Internal Medicine

## 2021-07-12 MED ORDER — AZITHROMYCIN 250 MG PO TABS: ORAL_TABLET | ORAL | 0 refills | Status: DC

## 2021-07-12 NOTE — Telephone Encounter (Signed)
Husband called and has concerns about getting a pleuracentesis .   I called back and left VM on the number he gave me for Brittany Mccarty and asked her to call me back with her concerns.   " My symptoms are just like I had when I was first diagnosed". -worsening sob just walking to [place]  increased -cough.  -hoarseness is worse at night.

## 2021-07-12 NOTE — Telephone Encounter (Signed)
Per Dr Julien Nordmann he will send in z-pak rx -pt notified.

## 2021-07-17 ENCOUNTER — Encounter: Payer: Self-pay | Admitting: Internal Medicine

## 2021-07-17 ENCOUNTER — Other Ambulatory Visit: Payer: Self-pay | Admitting: Internal Medicine

## 2021-07-17 ENCOUNTER — Telehealth: Payer: Self-pay | Admitting: Physician Assistant

## 2021-07-17 NOTE — Telephone Encounter (Signed)
Scheduled appt per 8/15 sch msg. Called pt, no answer. Left msg with appt date and time.

## 2021-07-19 ENCOUNTER — Ambulatory Visit (HOSPITAL_COMMUNITY)
Admission: RE | Admit: 2021-07-19 | Discharge: 2021-07-19 | Disposition: A | Discharge status: Home / Self Care | Payer: 59 | Source: Ambulatory Visit | Attending: Internal Medicine | Admitting: Internal Medicine

## 2021-07-19 ENCOUNTER — Other Ambulatory Visit: Payer: Self-pay | Admitting: Internal Medicine

## 2021-07-19 ENCOUNTER — Encounter: Payer: Self-pay | Admitting: Internal Medicine

## 2021-07-19 ENCOUNTER — Other Ambulatory Visit: Payer: Self-pay

## 2021-07-19 DIAGNOSIS — C3490 Malignant neoplasm of unspecified part of unspecified bronchus or lung: Secondary | ICD-10-CM | POA: Insufficient documentation

## 2021-07-19 MED ORDER — METHYLPREDNISOLONE 4 MG PO TBPK: ORAL_TABLET | ORAL | 0 refills | Status: DC

## 2021-07-19 MED ORDER — IOHEXOL 350 MG/ML SOLN
80.0000 mL | Freq: Once | INTRAVENOUS | Status: AC | PRN
  Administered 2021-07-19: 80 mL via INTRAVENOUS

## 2021-07-20 ENCOUNTER — Ambulatory Visit (HOSPITAL_COMMUNITY): Payer: 59

## 2021-07-20 ENCOUNTER — Encounter (HOSPITAL_COMMUNITY): Payer: Self-pay

## 2021-07-21 ENCOUNTER — Encounter: Payer: Self-pay | Admitting: Internal Medicine

## 2021-07-21 NOTE — Progress Notes (Signed)
[place] OFFICE PROGRESS NOTE  Patient, No Pcp Per (Inactive) No address on file  DIAGNOSIS: Stage IV (T3, N2, M1c) non-small cell lung cancer, adenocarcinoma with positive EGFR mutation with deletion in exon 19 and presented with large infrahilar mass in addition to mediastinal lymphadenopathy as well as metastatic disease to the liver and bone with suspicious brain metastasis that significantly improved after the patient started treatment with Tagrisso and was not evident on the repeat MRI of the brain few days ago.  This was diagnosed in May 2021. She had progressive metastatic disease to the brain in June 2022.   PRIOR THERAPY: SRS to the metastatic brain lesions under the care of Dr. Lisbeth Renshaw. Completed on 05/24/21.   CURRENT THERAPY: Tagrisso 80 mg p.o. daily.  First dose started Apr 21, 2020.  Status post 15 months of treatment  INTERVAL HISTORY: Brittany Mccarty 60 y.o. female returns to the clinic today for a follow up visit accompanied by her husband. The patient was last seen in the clinic on 07/10/21. At that time, the patient had a worsening cough and pain. She states this started 5 weeks ago and she wants to feel the way she did prior to 5 weeks ago. She was given cough medication (tessalon) and antibiotics (azithromycin) without improvement in her symptoms. She also was given a medrol dosepak without improvement in her cough. Her restaging CT scan of the chest, abdomen, and pelvis was rescheduled to further evaluate her condition.   Otherwise, she denies fevers, chills, or night sweats. She lost a few pounds. She denies hemoptysis. She continues to have cough and shortness of breath with exertion which has worsened over the last 5 weeks. She also has worsening pain in her mid thoracic spine and in her anterior chest that is worse at night time. She has been taking oxycodone 5 mg every 5 hours or so for pain. She also notes two days of intermittent right sided jaw pain,  especially at night. No history of bisphosphonate use. She states she had this before when she was diagnosed with lung cancer. She describes her pain as a tooth ache sensation. She denies nausea/vomiting except with excessive coughing she will cough/vomit foamy spit. She denies diarrhea or constipation. She is followed by neuro-oncology for her history of metastatic disease to the brain. She had SRS on 05/24/21. Her head pressure has resolved since having her brain metastases treated. She is here today for evaluation and to review her scan results.    MEDICAL HISTORY: Past Medical History:  Diagnosis Date   NSCL CA DX'D 04/07/20    ALLERGIES:  is allergic to other and cefdinir.  MEDICATIONS:  Current Outpatient Medications  Medication Sig Dispense Refill   omeprazole (PRILOSEC) 20 MG capsule Take 1 capsule (20 mg total) by mouth daily. 30 capsule 1   predniSONE (DELTASONE) 20 MG tablet Take 4 tablets (80 mg) daily for 1 week, followed by 2 tablets (40 mg) daily for 1 week, 1 tablet (20 mg) for 1 week. 30 tablet 1   azithromycin (ZITHROMAX) 250 MG tablet Use as instructed 6 each 0   benzonatate (TESSALON) 100 MG capsule Take 100 mg by mouth 3 (three) times daily as needed for cough.     famotidine (PEPCID) 40 MG tablet Take 40 mg by mouth 2 (two) times daily.     magnesium oxide (MAG-OX) 400 MG tablet Take 400 mg by mouth at bedtime.     ondansetron (ZOFRAN) 4 MG tablet Take 4  mg by mouth every 8 (eight) hours as needed for nausea or vomiting.  (Patient not taking: No sig reported)     osimertinib mesylate (TAGRISSO) 80 MG tablet Take 1 tablet (80 mg total) by mouth daily. 30 tablet 2   oxyCODONE (OXY IR/ROXICODONE) 5 MG immediate release tablet Take 1 tablet (5 mg total) by mouth 3 (three) times daily between meals as needed for severe pain. 45 tablet 0   No current facility-administered medications for this visit.    SURGICAL HISTORY:  Past Surgical History:  Procedure Laterality Date    IR REMOVAL OF PLURAL CATH W/CUFF  04/24/2020    REVIEW OF SYSTEMS:   Review of Systems  Constitutional: Positive for fatigue.  Negative for appetite change, chills, fever and unexpected weight change.  HENT: Positive for intermittent right jaw pain.  Negative for mouth sores, nosebleeds, sore throat and trouble swallowing.   Eyes: Negative for eye problems and icterus.  Respiratory: Positive for cough and shortness of breath.  Negative for hemoptysis and wheezing.   Cardiovascular: Positive for intermittent sternal chest discomfort. negative for leg swelling.  Gastrointestinal: Negative for abdominal pain, constipation, diarrhea, nausea and vomiting.  Genitourinary: Negative for bladder incontinence, difficulty urinating, dysuria, frequency and hematuria.   Musculoskeletal: Positive for intermittent thoracic back pain.  Negative for gait problem, neck pain and neck stiffness.  Skin: Negative for itching and rash.  Neurological: Negative for dizziness, extremity weakness, gait problem, headaches, light-headedness and seizures.  Hematological: Negative for adenopathy. Does not bruise/bleed easily.  Psychiatric/Behavioral: Negative for confusion, depression and sleep disturbance. The patient is not nervous/anxious.     PHYSICAL EXAMINATION:  Blood pressure (!) 137/104, pulse (!) 108, temperature (!) 97.5 F (36.4 C), temperature source Tympanic, resp. rate 18, weight 176 lb 5 oz (80 kg), SpO2 100 %.  ECOG PERFORMANCE STATUS: 1  Physical Exam  Constitutional: Oriented to person, place, and time and well-developed, well-nourished, and in no distress.  HENT:  Head: Normocephalic and atraumatic.  Mouth/Throat: Oropharynx is clear and moist. No oropharyngeal exudate.  No swelling or skin changes around her jaw. Eyes: Conjunctivae are normal. Right eye exhibits no discharge. Left eye exhibits no discharge. No scleral icterus.  Neck: Normal range of motion. Neck supple.  Cardiovascular: Normal  rate, regular rhythm, normal heart sounds and intact distal pulses.   Pulmonary/Chest: Effort normal.  Absent breath sounds in the right lower lobe.  No respiratory distress. No wheezes. No rales.  Abdominal: Soft. Bowel sounds are normal. Exhibits no distension and no mass. There is no tenderness.  Musculoskeletal: Normal range of motion. Exhibits no edema.  Lymphadenopathy:    No cervical adenopathy.  Neurological: Alert and oriented to person, place, and time. Exhibits normal muscle tone. Gait normal. Coordination normal.  Skin: Skin is warm and dry. No rash noted. Not diaphoretic. No erythema. No pallor.  Psychiatric: Mood, memory and judgment normal.  Vitals reviewed.  LABORATORY DATA: Lab Results  Component Value Date   WBC 3.9 (L) 07/10/2021   HGB 13.9 07/10/2021   HCT 40.0 07/10/2021   MCV 89.3 07/10/2021   PLT 148 (L) 07/10/2021      Chemistry      Component Value Date/Time   NA 138 07/10/2021 0746   K 4.9 07/10/2021 0746   CL 102 07/10/2021 0746   CO2 27 07/10/2021 0746   BUN 12 07/10/2021 0746   CREATININE 1.11 (H) 07/10/2021 0746      Component Value Date/Time   CALCIUM 9.9 07/10/2021 0746  ALKPHOS 77 07/10/2021 0746   AST 30 07/10/2021 0746   ALT 22 07/10/2021 0746   BILITOT 0.5 07/10/2021 0746       RADIOGRAPHIC STUDIES:  DG Chest 1 View  Result Date: 07/10/2021 CLINICAL DATA:  Pt c/o new onset worsening sob and cough especially on exertion in the past month, hx IV non-small cell lung cancer, adenocarcinoma and presented with large infrahilar mass in addition to mediastinal lymphadenopathy EXAM: CHEST  1 VIEW COMPARISON:  04/25/2020 FINDINGS: Moderate right pleural effusion. Consolidation/atelectasis at the right lung base. The interstitial opacities seen on prior study on the right have improved. Left lung clear. Heart size within normal limits. Right paratracheal fullness possible adenopathy as before. No pneumothorax. Visualized bones unremarkable.  IMPRESSION: Right pleural effusion with adjacent consolidation/atelectasis at the lung base. Electronically Signed   By: Lucrezia Europe M.D.   On: 07/10/2021 09:00   CT Chest W Contrast  Result Date: 07/19/2021 CLINICAL DATA:  Non-small-cell lung cancer.  Restaging. EXAM: CT CHEST, ABDOMEN, AND PELVIS WITH CONTRAST TECHNIQUE: Multidetector CT imaging of the chest, abdomen and pelvis was performed following the standard protocol during bolus administration of intravenous contrast. CONTRAST:  87m OMNIPAQUE IOHEXOL 350 MG/ML SOLN COMPARISON:  05/16/2021 FINDINGS: CT CHEST FINDINGS Cardiovascular: The heart size is normal. No substantial pericardial effusion. No thoracic aortic aneurysm. Mediastinum/Nodes: 2.4 x 2.4 cm calcified right paratracheal node stable since prior (2.6 x 2.5 cm). No new mediastinal lymphadenopathy. Calcified nodal tissue in the right hilum is stable. Left hilum unremarkable. The esophagus has normal imaging features. There is no axillary lymphadenopathy. Lungs/Pleura: Scarring in the extreme right lung apex is similar to prior although there is progressive ground-glass opacity in interlobular septal thickening in the high right upper lobe posteriorly (image 47/6). 6 mm perifissural nodule in the right lung (74/6) was present previously and is similar in size although appears more confluent today. Retrocardiac right lower lobe consolidative opacity is similar to prior although the bronchiectatic airways in this region seen on the previous study have become impacted in the interval. Similar bandlike ground-glass opacity noted peripheral left upper lobe. Several tiny 2-3 mm pulmonary nodules identified bilaterally are unchanged since prior. No substantial pleural effusion. Musculoskeletal: No worrisome lytic or sclerotic osseous abnormality. CT ABDOMEN PELVIS FINDINGS Hepatobiliary: No suspicious focal abnormality within the liver parenchyma. There is no evidence for gallstones, gallbladder wall  thickening, or pericholecystic fluid. No intrahepatic or extrahepatic biliary dilation. Pancreas: No focal mass lesion. No dilatation of the main duct. No intraparenchymal cyst. No peripancreatic edema. Spleen: Is stable in the interval. Adrenals/Urinary Tract: No adrenal nodule or mass. Cortical scarring noted lower pole left kidney. Right kidney unremarkable No evidence for hydroureter. The urinary bladder appears normal for the degree of distention. Stomach/Bowel: Stomach is unremarkable. No gastric wall thickening. No evidence of outlet obstruction. Duodenum is normally positioned as is the ligament of Treitz. 4-5 cm length of jejunal intussusception identified on 57/2 and has nearly resolved by renal delay imaging 3 minutes later, consistent with transient process. Ileal loops unremarkable. The terminal ileum is normal. The appendix is not well visualized, but there is no edema or inflammation in the region of the cecum. No gross colonic mass. No colonic wall thickening. Vascular/Lymphatic: No abdominal aortic aneurysm. No abdominal aortic atherosclerotic calcification. There is no gastrohepatic or hepatoduodenal ligament lymphadenopathy. No retroperitoneal or mesenteric lymphadenopathy. No pelvic sidewall lymphadenopathy. Reproductive: Unremarkable. Other: No intraperitoneal free fluid. Musculoskeletal: No worrisome lytic or sclerotic osseous abnormality. IMPRESSION: 1. Interval progression  of ground-glass opacity and interlobular septal thickening in the high right upper lobe posteriorly. This is nonspecific and may be related to an infectious/inflammatory etiology. As lymphangitic tumor cannot be excluded, close attention recommended. 2. Otherwise stable exam. No other new or progressive findings in the chest, abdomen, or pelvis. 3. Stable appearance of calcified right paratracheal node. Tiny pulmonary nodules remain unchanged. 4. Similar appearance of consolidative opacity in the retrocardiac right lower  lobe although the bronchiectatic airways in this region seen on the previous study have become impacted in the interval. Attention on follow-up recommended. Electronically Signed   By: Misty Stanley M.D.   On: 07/19/2021 12:19   [place] CHEST (PLEURAL EFFUSION)  Result Date: 07/12/2021 CLINICAL DATA:  , Lung carcinoma, shortness of breath, right pleural effusion suspected on chest radiography EXAM: CHEST ULTRASOUND COMPARISON:  Radiograph from previous day FINDINGS: No significant pleural effusion bilaterally. IMPRESSION: No significant pleural effusion seen on ultrasound. Thoracentesis deferred. Electronically Signed   By: Lucrezia Europe M.D.   On: 07/12/2021 07:28   CT Abdomen Pelvis W Contrast  Result Date: 07/19/2021 CLINICAL DATA:  Non-small-cell lung cancer.  Restaging. EXAM: CT CHEST, ABDOMEN, AND PELVIS WITH CONTRAST TECHNIQUE: Multidetector CT imaging of the chest, abdomen and pelvis was performed following the standard protocol during bolus administration of intravenous contrast. CONTRAST:  32m OMNIPAQUE IOHEXOL 350 MG/ML SOLN COMPARISON:  05/16/2021 FINDINGS: CT CHEST FINDINGS Cardiovascular: The heart size is normal. No substantial pericardial effusion. No thoracic aortic aneurysm. Mediastinum/Nodes: 2.4 x 2.4 cm calcified right paratracheal node stable since prior (2.6 x 2.5 cm). No new mediastinal lymphadenopathy. Calcified nodal tissue in the right hilum is stable. Left hilum unremarkable. The esophagus has normal imaging features. There is no axillary lymphadenopathy. Lungs/Pleura: Scarring in the extreme right lung apex is similar to prior although there is progressive ground-glass opacity in interlobular septal thickening in the high right upper lobe posteriorly (image 47/6). 6 mm perifissural nodule in the right lung (74/6) was present previously and is similar in size although appears more confluent today. Retrocardiac right lower lobe consolidative opacity is similar to prior although the  bronchiectatic airways in this region seen on the previous study have become impacted in the interval. Similar bandlike ground-glass opacity noted peripheral left upper lobe. Several tiny 2-3 mm pulmonary nodules identified bilaterally are unchanged since prior. No substantial pleural effusion. Musculoskeletal: No worrisome lytic or sclerotic osseous abnormality. CT ABDOMEN PELVIS FINDINGS Hepatobiliary: No suspicious focal abnormality within the liver parenchyma. There is no evidence for gallstones, gallbladder wall thickening, or pericholecystic fluid. No intrahepatic or extrahepatic biliary dilation. Pancreas: No focal mass lesion. No dilatation of the main duct. No intraparenchymal cyst. No peripancreatic edema. Spleen: Is stable in the interval. Adrenals/Urinary Tract: No adrenal nodule or mass. Cortical scarring noted lower pole left kidney. Right kidney unremarkable No evidence for hydroureter. The urinary bladder appears normal for the degree of distention. Stomach/Bowel: Stomach is unremarkable. No gastric wall thickening. No evidence of outlet obstruction. Duodenum is normally positioned as is the ligament of Treitz. 4-5 cm length of jejunal intussusception identified on 57/2 and has nearly resolved by renal delay imaging 3 minutes later, consistent with transient process. Ileal loops unremarkable. The terminal ileum is normal. The appendix is not well visualized, but there is no edema or inflammation in the region of the cecum. No gross colonic mass. No colonic wall thickening. Vascular/Lymphatic: No abdominal aortic aneurysm. No abdominal aortic atherosclerotic calcification. There is no gastrohepatic or hepatoduodenal ligament lymphadenopathy. No retroperitoneal  or mesenteric lymphadenopathy. No pelvic sidewall lymphadenopathy. Reproductive: Unremarkable. Other: No intraperitoneal free fluid. Musculoskeletal: No worrisome lytic or sclerotic osseous abnormality. IMPRESSION: 1. Interval progression of  ground-glass opacity and interlobular septal thickening in the high right upper lobe posteriorly. This is nonspecific and may be related to an infectious/inflammatory etiology. As lymphangitic tumor cannot be excluded, close attention recommended. 2. Otherwise stable exam. No other new or progressive findings in the chest, abdomen, or pelvis. 3. Stable appearance of calcified right paratracheal node. Tiny pulmonary nodules remain unchanged. 4. 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 Attention on follow-up recommended. Electronically Signed   By: Misty Stanley M.D.   On: 07/19/2021 12:19     ASSESSMENT/PLAN:  This is a very pleasant 60 year old Caucasian female diagnosed with stage IV (T3, N2, M1C) non-small cell lung cancer, adenocarcinoma.  She is positive for an EGFR mutation with deletion in exon 19.  She presented with a large infrahilar mass in addition to mediastinal lymphadenopathy, metastatic disease to the liver and bone, and a suspicious brain metastasis which improved significantly after the patient started treatment with Tagrisso and was not evident on repeat MRI.  The patient was diagnosed in May 2021.   She underwent SRS to the recurrent brain metastases in une 2022.   The patient is currently undergoing treatment with Tagrisso 80 mg p.o. daily.  She is status post 15  months of treatment.  The patient's first dose treatment was on Apr 21, 2020.  She has been tolerating treatment well without any concerning adverse side effects.  She has had a progressive cough which has not improved with antibiotics or a medrol dosepak.   The patient recently had a restaging CT scan. The patient was seen with Dr. Julien Nordmann. Dr. Julien Nordmann personally and independently reviewed the scan and discussed the results with the patient. The scan showed Interval  progression of ground-glass opacity and interlobular septal thickening in the high right upper lobe posteriorly. This is nonspecific and may be related to an infectious/inflammatory etiology. As lymphangitic tumor cannot be excluded, close attention recommended. Otherwise, she had a stable exam. She has a similar appearing consolidative opacity in the retrocardiac right lower lobe although the bronchiectatic airways have become impacted in the interval.  Dr. Julien Nordmann recommends that we hold her Tagrisso for 7 to 10 days and place her on a high-dose prednisone taper for possible drug-induced pneumonitis.  She will start with 80 mg p.o. daily for 1 week followed by 40 mg p.o. daily for 1 week followed by 20 mg p.o. daily for 1 week.  I also sent her Prilosec to reduce the risk of gastritis.  She was advised to take the prednisone in the morning.  She does not have diabetes but discussed that this may raise her blood sugar.  She has 2 days left of her Medrol Dosepak.  Discussed that she should not take this and should take the prednisone prescription that was sent in today instead.  We will see her back for follow-up visit in 3 weeks for evaluation and repeat blood work.  Of course, we discussed that if her symptoms worsen at any time to please call us sooner.  For her pain, Dr. Julien Nordmann would like for her to continue with the 5 mg oxycodone.  He discussed that she may also take Tylenol or ibuprofen intermittently as well.   For  her jaw pain, the patient states that this occurred when she was first diagnosed with lung cancer although no abnormalities were seen when evaluated by the dentist.  She is an upcoming brain MRI which will also image her jaw.  We will evaluate this at that time.  She states that her jaw pain occurred twice.  She knows to call us if she has any worsening symptoms.  The patient's scan showed the progressive impaction and the previously noted consolidation which did not improve with  antibiotics.  Dr. Julien Nordmann would like the patient to be referred to pulmonology to further evaluate this and for further recommendations.  The patient was advised to call immediately if she has any concerning symptoms in the interval. The patient voices understanding of current disease status and treatment options and is in agreement with the current care plan. All questions were answered. The patient knows to call the clinic with any problems, questions or concerns. We can certainly see the patient much sooner if necessary          Orders Placed This Encounter  Procedures   Ambulatory referral to Pulmonology    Referral Priority:   Urgent    Referral Type:   Consultation    Referral Reason:   Specialty Services Required    Requested Specialty:   Pulmonary Disease    Number of Visits Requested:   [place], PA-C 07/24/21  ADDENDUM: Hematology/Oncology Attending: I had a face-to-face encounter with the patient today.  I reviewed her record, lab and scan and recommended her care plan.  This is a very pleasant 60 years old white female with stage IV non-small cell lung cancer, adenocarcinoma with positive EGFR mutation with deletion in exon 63 diagnosed and May 2021.  The patient has been on treatment with Tagrisso 80 mg p.o. daily.  She has been tolerating this treatment well with no concerning adverse effects. She has been complaining of increasing fatigue and weakness as well as shortness of breath in addition to chest and back pain. She had repeat CT scan of the chest, abdomen pelvis performed recently for evaluation of her condition.  I personally and independently reviewed the scan images and discussed the result and showed the images to the patient and her husband today. Her scan showed interval progression of groundglass opacity and interlobular septal thickening in the high right upper lobe posteriorly that is nonspecific but could be related to an  inflammatory/infectious etiology.  Lymphangitic spread of the tumor could not be completely excluded.  The patient otherwise has a stable disease. I had a lengthy discussion with the patient and her husband today about her current condition and treatment options for her condition.  Because of concern about the possibility of Tagrisso induced pneumonitis, I told the patient to hold her treatment with Tagrisso for the next 7-10 days. I will start the patient on high-dose prednisone to be tapered over the next few weeks. For the back pain she will continue her current treatment with oxycodone. We will see the patient back for follow-up visit in around 3 weeks for evaluation and management of her condition but she was also advised to call earlier if she has any concerning symptoms in the interval. The total time spent in the appointment was 35 minutes. Disclaimer: This note was dictated with voice recognition software. Similar sounding words can inadvertently be transcribed and may be missed upon review. Eilleen Kempf, MD 07/24/21

## 2021-07-24 ENCOUNTER — Inpatient Hospital Stay (HOSPITAL_BASED_OUTPATIENT_CLINIC_OR_DEPARTMENT_OTHER): Payer: 59 | Admitting: Physician Assistant

## 2021-07-24 ENCOUNTER — Other Ambulatory Visit: Payer: Self-pay

## 2021-07-24 VITALS — BP 137/104 | HR 108 | Temp 97.5°F | Resp 18 | Wt 176.3 lb

## 2021-07-24 DIAGNOSIS — R059 Cough, unspecified: Secondary | ICD-10-CM | POA: Diagnosis not present

## 2021-07-24 DIAGNOSIS — C3491 Malignant neoplasm of unspecified part of right bronchus or lung: Secondary | ICD-10-CM

## 2021-07-24 MED ORDER — PREDNISONE 20 MG PO TABS
ORAL_TABLET | ORAL | 0 refills | Status: DC
Start: 2021-07-24 — End: 2021-08-31

## 2021-07-24 MED ORDER — OMEPRAZOLE 20 MG PO CPDR: 20.0000 mg | DELAYED_RELEASE_CAPSULE | Freq: Every day | ORAL | 1 refills | Status: DC

## 2021-07-24 MED ORDER — PREDNISONE 20 MG PO TABS: ORAL_TABLET | ORAL | 1 refills | Status: DC

## 2021-07-26 ENCOUNTER — Encounter: Payer: Self-pay | Admitting: Internal Medicine

## 2021-07-27 ENCOUNTER — Encounter: Payer: Self-pay | Admitting: Internal Medicine

## 2021-07-28 ENCOUNTER — Other Ambulatory Visit: Payer: Self-pay | Admitting: Internal Medicine

## 2021-07-28 ENCOUNTER — Encounter: Payer: Self-pay | Admitting: Internal Medicine

## 2021-07-28 ENCOUNTER — Other Ambulatory Visit: Payer: Self-pay | Admitting: Physician Assistant

## 2021-07-28 DIAGNOSIS — C3491 Malignant neoplasm of unspecified part of right bronchus or lung: Secondary | ICD-10-CM

## 2021-07-28 MED ORDER — BENZONATATE 100 MG PO CAPS: 100.0000 mg | ORAL_CAPSULE | Freq: Three times a day (TID) | ORAL | 1 refills | Status: AC | PRN

## 2021-07-29 ENCOUNTER — Encounter (HOSPITAL_COMMUNITY): Payer: Self-pay | Admitting: Emergency Medicine

## 2021-07-29 ENCOUNTER — Observation Stay (HOSPITAL_COMMUNITY)
Admission: EM | Admit: 2021-07-29 | Discharge: 2021-07-31 | Disposition: A | Discharge status: Home / Self Care | Payer: 59 | Attending: Emergency Medicine | Admitting: Emergency Medicine

## 2021-07-29 ENCOUNTER — Emergency Department (HOSPITAL_COMMUNITY): Payer: 59

## 2021-07-29 ENCOUNTER — Other Ambulatory Visit: Payer: Self-pay

## 2021-07-29 ENCOUNTER — Other Ambulatory Visit: Payer: Self-pay | Admitting: Internal Medicine

## 2021-07-29 DIAGNOSIS — C3491 Malignant neoplasm of unspecified part of right bronchus or lung: Secondary | ICD-10-CM | POA: Diagnosis present

## 2021-07-29 DIAGNOSIS — R079 Chest pain, unspecified: Secondary | ICD-10-CM | POA: Diagnosis present

## 2021-07-29 DIAGNOSIS — C7931 Secondary malignant neoplasm of brain: Secondary | ICD-10-CM | POA: Diagnosis present

## 2021-07-29 DIAGNOSIS — E871 Hypo-osmolality and hyponatremia: Secondary | ICD-10-CM | POA: Diagnosis not present

## 2021-07-29 DIAGNOSIS — H538 Other visual disturbances: Secondary | ICD-10-CM | POA: Diagnosis not present

## 2021-07-29 DIAGNOSIS — R519 Headache, unspecified: Secondary | ICD-10-CM | POA: Diagnosis not present

## 2021-07-29 DIAGNOSIS — R1310 Dysphagia, unspecified: Secondary | ICD-10-CM

## 2021-07-29 DIAGNOSIS — Z79899 Other long term (current) drug therapy: Secondary | ICD-10-CM | POA: Insufficient documentation

## 2021-07-29 DIAGNOSIS — Z85118 Personal history of other malignant neoplasm of bronchus and lung: Secondary | ICD-10-CM | POA: Diagnosis not present

## 2021-07-29 DIAGNOSIS — E663 Overweight: Secondary | ICD-10-CM | POA: Diagnosis not present

## 2021-07-29 DIAGNOSIS — J029 Acute pharyngitis, unspecified: Secondary | ICD-10-CM | POA: Diagnosis not present

## 2021-07-29 DIAGNOSIS — J984 Other disorders of lung: Secondary | ICD-10-CM

## 2021-07-29 DIAGNOSIS — R0781 Pleurodynia: Secondary | ICD-10-CM | POA: Diagnosis present

## 2021-07-29 DIAGNOSIS — Z20822 Contact with and (suspected) exposure to covid-19: Secondary | ICD-10-CM | POA: Insufficient documentation

## 2021-07-29 DIAGNOSIS — I2699 Other pulmonary embolism without acute cor pulmonale: Secondary | ICD-10-CM | POA: Diagnosis not present

## 2021-07-29 DIAGNOSIS — H539 Unspecified visual disturbance: Secondary | ICD-10-CM

## 2021-07-29 DIAGNOSIS — J701 Chronic and other pulmonary manifestations due to radiation: Secondary | ICD-10-CM

## 2021-07-29 DIAGNOSIS — R0789 Other chest pain: Secondary | ICD-10-CM

## 2021-07-29 DIAGNOSIS — J189 Pneumonia, unspecified organism: Secondary | ICD-10-CM

## 2021-07-29 LAB — BASIC METABOLIC PANEL
Anion gap: 10 (ref 5–15)
BUN: 14 mg/dL (ref 8–23)
CO2: 22 mmol/L (ref 22–32)
Calcium: 9.3 mg/dL (ref 8.9–10.3)
Chloride: 99 mmol/L (ref 98–111)
Creatinine, Ser: 0.37 mg/dL — ABNORMAL LOW (ref 0.44–1.00)
GFR, Estimated: >60 mL/min (ref >60)
Glucose, Bld: 113 mg/dL — ABNORMAL HIGH (ref 70–99)
Potassium: 4.3 mmol/L (ref 3.5–5.1)
Sodium: 131 mmol/L — ABNORMAL LOW (ref 135–145)

## 2021-07-29 LAB — CBC
HCT: 40.9 % (ref 36.0–46.0)
Hemoglobin: 13.6 g/dL (ref 12.0–15.0)
MCH: 30.8 pg (ref 26.0–34.0)
MCHC: 33.3 g/dL (ref 30.0–36.0)
MCV: 92.5 fL (ref 80.0–100.0)
Platelets: 117 10*3/uL — ABNORMAL LOW (ref 150–400)
RBC: 4.42 MIL/uL (ref 3.87–5.11)
RDW: 12.5 % (ref 11.5–15.5)
WBC: 9 10*3/uL (ref 4.0–10.5)
nRBC: 0 % (ref 0.0–0.2)

## 2021-07-29 LAB — TROPONIN I (HIGH SENSITIVITY): Troponin I (High Sensitivity): 5 ng/L (ref <18)

## 2021-07-29 MED ORDER — MORPHINE SULFATE (PF) 4 MG/ML IV SOLN
4.0000 mg | Freq: Once | INTRAVENOUS | Status: AC
  Administered 2021-07-30: 4 mg via INTRAVENOUS
  Filled 2021-07-29: qty 1

## 2021-07-29 NOTE — ED Provider Notes (Signed)
[facility] DEPT Provider Note   CSN: [id number] Arrival date & time: 07/29/21  2117     History Chief Complaint  Patient presents with   Chest Pain   Shoulder Pain    Brittany Mccarty is a 60 y.o. female with a history of stage IV (T3, N2, M1c) non-small cell lung cancer with metastasis, PE not on anticoagulation who presents to the emergency department with a chief complaint of chest pain.  The patient reports a 5-week history of worsening chest pain, cough, and shortness of breath.  She reports that the shortness of breath has been constant.  Worse with exertion, but she also feels dyspneic at rest.  Cough has been nonproductive, but worse at night and she has had to sleep in a recliner due to being unable to lay flat.  Reports that she has been getting minimal sleep for days due to her symptoms.  She characterizes the chest pain as burning and gnawing.  Pain is located in the left side of her chest. She also endorses right mid back pain that feels similar, but do not radiate.  She states that the back and chest pain feels similar to when she was previously diagnosed with a PE.  She is not currently anticoagulated.  She has been taking her home oxycodone that will relieve her chest and back pain for approximately 3 hours before it returns.  No other known aggravating or alleviating factors.   She also reports that over the last 5 days that she has developed trouble swallowing.  She feels as if her throat is closing and that she chokes whenever she eats or drinks.  She is a vegetarian, but reports that dysphagia is present with both solid and liquids.  She reports that she is also been having pain to the right side of her jaw and right ear.    She also reports that over the last few days that she has developed a right sided temporal headache and visual changes to her left eye that she describes as " feeling as if I am looking through water."  No diplopia, no  right-sided visual changes, fever, chills, neck pain or stiffness, sore throat, nasal congestion, rhinorrhea, diarrhea, constipation, abdominal pain, flank pain, dysuria, rash.  She does report that she will have posttussive emesis, but no episodes of vomiting not associated with coughing.  She previously completed radiation.  She is not currently on targeted therapy with Tagrisso, but this was held due to her symptoms as there was concern for drug-induced pneumonitis.  Per chart review, she completed a high-dose prednisone taper starting with 80 mg for 1 week followed by 40 mg for 1 week, followed by 20 mg for 1 week.  The history is provided by the patient and medical records. No language interpreter was used.      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     No family history on file.  Social History   Tobacco Use   Smoking status: Never   Smokeless tobacco: Never  Substance Use Topics   Alcohol use: Not Currently   Drug use: Never    Home Medications Prior to Admission medications   Medication Sig Start Date End Date Taking? Authorizing Provider  amoxicillin-clavulanate (AUGMENTIN) 875-125 MG tablet Take 1 tablet by mouth every 12 (twelve) hours for 5 days. 07/30/21 08/04/21 Yes Bowie Delia A, PA-C  benzonatate (TESSALON) 100 MG capsule Take 1 capsule (100 mg total) by  mouth 3 (three) times daily as needed for cough. 07/28/21  Yes Heilingoetter, Cassandra L, PA-C  cyclobenzaprine (FLEXERIL) 10 MG tablet Take 1 tablet (10 mg total) by mouth 2 (two) times daily as needed for muscle spasms. 07/30/21  Yes Daisha Filosa A, PA-C  HYDROcodone-acetaminophen (HYCET) 7.5-325 mg/15 ml solution Take 15 mLs by mouth 4 (four) times daily as needed for moderate pain. 07/30/21 07/30/22 Yes Helene Bernstein A, PA-C  ibuprofen (ADVIL) 200 MG tablet Take 200 mg by mouth every 6 (six) hours as needed for mild pain or moderate pain.   Yes [provider]  lidocaine (XYLOCAINE) 2 % solution Use as directed 15 mLs in the mouth or throat as needed for mouth pain. 07/30/21  Yes Clare Casto A, PA-C  omeprazole (PRILOSEC) 20 MG capsule Take 1 capsule (20 mg total) by mouth daily. 07/24/21  Yes Heilingoetter, Cassandra L, PA-C  ondansetron (ZOFRAN) 4 MG tablet Take 4 mg by mouth every 8 (eight) hours as needed for nausea or vomiting. 04/08/20  Yes [provider]  oxyCODONE (OXY IR/ROXICODONE) 5 MG immediate release tablet Take 1 tablet (5 mg total) by mouth 3 (three) times daily between meals as needed for severe pain. 06/27/21  Yes Curt Bears, MD  predniSONE (DELTASONE) 20 MG tablet Take 4 tablets (80 mg) daily for 1 week, followed by 2 tablets (40 mg) daily for 1 week, 1 tablet (20 mg) for 1 week. 07/24/21  Yes Heilingoetter, Cassandra L, PA-C  magnesium oxide (MAG-OX) 400 MG tablet Take 400 mg by mouth at bedtime.    [provider]  osimertinib mesylate (TAGRISSO) 80 MG tablet Take 1 tablet (80 mg total) by mouth daily. 06/08/21   Curt Bears, MD    Allergies    Other and Cefdinir  Review of Systems   Review of Systems  Constitutional:  Negative for activity change, chills, diaphoresis and fever.  HENT:  Positive for ear pain and trouble swallowing. Negative for congestion, nosebleeds, rhinorrhea, sore throat, tinnitus and voice change.        Jaw pain   Eyes:  Positive for visual disturbance ("looking through water" in the left eye). Negative for photophobia.  Respiratory:  Positive for cough, choking, shortness of breath and wheezing.   Cardiovascular:  Positive for chest pain. Negative for palpitations and leg swelling.  Gastrointestinal:  Negative for abdominal pain, blood in stool, constipation, diarrhea, nausea and vomiting.  Genitourinary:  Negative for dysuria, enuresis, flank pain, hematuria, urgency and vaginal pain.  Musculoskeletal:  Positive for arthralgias, back pain and myalgias. Negative for gait problem, joint swelling, neck pain and neck stiffness.  Skin:  Negative for color change, rash and wound.  Allergic/Immunologic: Negative for immunocompromised state.  Neurological:  Positive for headaches. Negative for dizziness, seizures, syncope, speech difficulty, weakness, light-headedness and numbness.  Psychiatric/Behavioral:  Negative for confusion.    Physical Exam Updated Vital  Signs BP (!) 154/99   Pulse 98   Temp 98.6 F (37 C) (Oral)   Resp 17   Ht 5\' 5"  (1.651 m)   Wt 79.8 kg   SpO2 94%   BMI 29.29 kg/m   Physical Exam Vitals and nursing note reviewed.  Constitutional:      General: She is not in acute distress.    Appearance: She is not ill-appearing, toxic-appearing or diaphoretic.  HENT:     Head: Normocephalic.     Nose: Nose normal.     Mouth/Throat:     Comments: Posterior oropharynx is erythematous, but it is patent and there are no exudates.  Uvula is midline. Eyes:     Conjunctiva/sclera: Conjunctivae normal.  Neck:     Vascular: No carotid bruit.     Comments: No palpable lymphadenopathy to the anterior or posterior cervical chains.  She does have some tenderness to palpation of the trachea.  No palpable thyroid masses.  No meningismus. Cardiovascular:     Rate and Rhythm: Normal rate and regular rhythm.     Pulses: Normal pulses.     Heart sounds: Normal heart sounds. No murmur heard.   No  friction rub. No gallop.  Pulmonary:     Effort: Pulmonary effort is normal. No respiratory distress.     Comments: Lungs are clear to auscultation bilaterally.  She has mild tachypnea.  No increased work of breathing.  However, the patient sounds breathless. Abdominal:     General: There is no distension.     Palpations: Abdomen is soft. There is no mass.     Tenderness: There is no abdominal tenderness. There is no right CVA tenderness, left CVA tenderness, guarding or rebound.     Hernia: No hernia is present.     Comments: Abdomen is soft, nontender, nondistended.  Musculoskeletal:     Cervical back: Neck supple. Tenderness present. No rigidity.     Right lower leg: No edema.     Left lower leg: No edema.     Comments: No peripheral edema. Reproducible tenderness palpation to the right thoracic back.  No rashes, wounds, erythema.  No crepitus or step-offs to the posterior ribs.  Lymphadenopathy:     Cervical: No cervical adenopathy.  Skin:    General: Skin is warm.     Capillary Refill: Capillary refill takes less than 2 seconds.     Coloration: Skin is not jaundiced or pale.     Findings: No rash.  Neurological:     General: No focal deficit present.     Mental Status: She is alert.  Psychiatric:        Behavior: Behavior normal.    ED Results / Procedures / Treatments   Labs (all labs ordered are listed, but only abnormal results are displayed) Labs Reviewed  BASIC METABOLIC PANEL - Abnormal; Notable for the following components:      Result Value   Sodium 131 (*)    Glucose, Bld 113 (*)    Creatinine, Ser 0.37 (*)    All other components within normal limits  CBC - Abnormal; Notable for the following components:   Platelets 117 (*)    All other components within normal limits  RESP PANEL BY RT-PCR (FLU A&B, COVID) ARPGX2  LIPASE, BLOOD  HEPATIC FUNCTION PANEL  TSH  TROPONIN I (HIGH SENSITIVITY)  TROPONIN I (HIGH SENSITIVITY)    EKG EKG  Interpretation  Date/Time:  Saturday July 29 2021 21:40:38 EDT Ventricular Rate:  59  PR Interval:  162 QRS Duration: 80 QT Interval:  434 QTC Calculation: 429 R Axis:   -9 Text Interpretation: Sinus bradycardia Otherwise normal ECG No acute changes Confirmed by Brittany Mccarty [id number]) on 07/30/2021 12:21:33 AM  Radiology DG Chest 2 View  Result Date: 07/29/2021 CLINICAL DATA:  Chest pain, history of lung cancer EXAM: CHEST - 2 VIEW COMPARISON:  CT chest dated 07/19/2021 FINDINGS: Postsurgical changes with volume loss in the right hemithorax. Additional mild hazy ground-glass opacity, better evaluated on CT, favoring radiation changes and/or infection. Left lung is clear. Small right pleural effusion. No pneumothorax. The heart is normal in size IMPRESSION: Postsurgical changes with volume loss in the right hemithorax. Superimposed ground-glass opacity in the right lung is better evaluated on recent CT, possibly reflecting radiation changes and/or infection. Electronically Signed   By: Julian Hy M.D.   On: 07/29/2021 22:07   CT Angio Chest PE W and/or Wo Contrast  Addendum Date: 07/30/2021   ADDENDUM REPORT: 07/30/2021 04:24 ADDENDUM: Upon re-review of CTA chest from 0023 hours today, the following findings are noted: Tiny low-density filling defect in a subsegmental, distal branch to the left lower lobe lateral basal segment seen on series 5, images 215 through 225. This appears primarily nonocclusive, and no other convincing pulmonary artery filling defect is identified. This was discussed by telephone with PA Dorthea Maina on 07/30/2021 at 04:20. Electronically Signed   By: Genevie Ann M.D.   On: 07/30/2021 04:24   Result Date: 07/30/2021 CLINICAL DATA:  Chest pain EXAM: CT ANGIOGRAPHY CHEST WITH CONTRAST TECHNIQUE: Multidetector CT imaging of the chest was performed using the standard protocol during bolus administration of intravenous contrast. Multiplanar CT image reconstructions and MIPs  were obtained to evaluate the vascular anatomy. CONTRAST:  58mL OMNIPAQUE IOHEXOL 350 MG/ML SOLN COMPARISON:  CT chest dated 07/19/2021 FINDINGS: Cardiovascular: Satisfactory opacification of the bilateral pulmonary arteries to the segmental level. Right lower lobe pulmonary arteries are diminutive. No evidence of pulmonary embolism. Although not tailored for evaluation of the thoracic aorta, there is no evidence thoracic aortic aneurysm or dissection. Heart is top-normal in size.  No pericardial effusion. Mediastinum/Nodes: No suspicious mediastinal lymphadenopathy. 2.3 cm calcified right paratracheal node (series 4/image 87), unchanged. Visualized thyroid is unremarkable. Lungs/Pleura: Bandlike opacity with volume loss in the medial right lower lobe (series 6/image 102), favoring radiation changes. Patchy/ground-glass opacities in the right lung, including in the posterior right upper lobe (series 6/image 87), with associated interlobular septal thickening. This appearance favors radiation changes, although infection is also possible. Stable nodular scarring in the anterior left upper lobe (series 6/image 61). Evaluation of the lung parenchyma is constrained by respiratory motion. Within that constraint, there are no new/suspicious pulmonary nodules. Trace right pleural effusion.  No pneumothorax. Upper Abdomen: Visualized upper abdomen is grossly unremarkable. Musculoskeletal: Mild degenerative changes of the visualized thoracolumbar spine. Review of the MIP images confirms the above findings. IMPRESSION: No evidence of pulmonary embolism. Suspected radiation changes in the right hemithorax. Superimposed infection/pneumonia is possible, but is not favored. No interval change from recent CT. Electronically Signed: By: Julian Hy M.D. On: 07/30/2021 00:48    Procedures Procedures   Medications Ordered in ED Medications  fentaNYL (SUBLIMAZE) injection 75 mcg (has no administration in time range)   apixaban (ELIQUIS) tablet 10 mg (10 mg Oral Given 07/30/21 0550)    Followed by  apixaban (ELIQUIS) tablet 5 mg (has no administration in time range)  morphine 4 MG/ML injection 4 mg (4 mg Intravenous  Given 07/30/21 0030)  iohexol (OMNIPAQUE) 350 MG/ML injection 80 mL (80 mLs Intravenous Contrast Given 07/30/21 0014)  prochlorperazine (COMPAZINE) injection 10 mg (10 mg Intravenous Given 07/30/21 0308)  alum & mag hydroxide-simeth (MAALOX/MYLANTA) 200-200-20 MG/5ML suspension 30 mL (30 mLs Oral Given 07/30/21 0307)    And  lidocaine (XYLOCAINE) 2 % viscous mouth solution 15 mL (15 mLs Oral Given 07/30/21 0307)  HYDROcodone-acetaminophen (HYCET) 7.5-325 mg/15 ml solution 10 mL (10 mLs Oral Given 07/30/21 0305)  diphenhydrAMINE (BENADRYL) injection 25 mg (25 mg Intravenous Given 07/30/21 0308)  sodium chloride 0.9 % bolus 500 mL (0 mLs Intravenous Stopped 07/30/21 0420)    ED Course  I have reviewed the triage vital signs and the nursing notes.  Pertinent labs & imaging results that were available during my care of the patient were reviewed by me and considered in my medical decision making (see chart for details).    MDM Rules/Calculators/A&P                           60 year old female with a history of stage IV (T3, N2, M1c) non-small cell lung cancer with metastasis, PE not on anticoagulation who presents emergency department with a 5-week history of worsening chest and upper back pain, shortness of breath, and cough.  She was started on a prednisone taper by oncology as there was concern she may be having a drug induced pneumonitis from [place].  Symptoms have not improved with steroids and antibiotic course.  Over the last 4 days, she is also developed solid and liquid dysphagia with choking episodes, and right-sided jaw and ear pain.  Hypertensive.  Mild tachypnea.  She has no hypoxia.  No tachycardia and she is afebrile.  On exam, lungs are clear she has no increased work of breathing, but  appears breathless.  No reproducible tenderness palpation to the chest wall, but pain is reproducible to the right upper back.  Abdomen is benign.  She has tenderness palpation to the trachea, but no thyroid masses, lymphadenopathy, or meningismus.  Labs and imaging have been reviewed and independently interpreted by me.  EKG with sinus bradycardia.  No metabolic derangements.  No abnormalities of the neck noted on CT.  Initially, CT reading was unremarkable and patient was starting to improve so the plan had been to discharge the patient home with close outpatient follow-up with oncology.  However, I received a call back from radiology after second evaluation.  There is a tiny low-density filling defect in the subsegmental distal branch to the left lower lobe that appears primarily nonocclusive.  Notably, this is where the patient is endorsing her pain that she said felt like her previous PE.  Will start the patient on heparin and admit for further evaluation.  Radiology recommended bilateral DVT studies to see if there is other clot.  We will plan to admit the patient for pain control in addition to following up on bilateral DVT studies.  Patient also has been trying to get pulmonology follow-up from her oncology team and for the last few weeks.  It may be reasonable to coordinate pulmonology follow-up while the patient is admitted and [place] her oncologist.  She was also scheduled to have an MRI brain tomorrow a.m.  So it may be reasonable to obtain this while she is admitted. Dr. Leonette Monarch is in agreement with work up and plan.   Discussed the patient with Dr. Tonie Griffith, hospitalist.  He will accept the patient for  admission. The patient appears reasonably stabilized for admission considering the current resources, flow, and capabilities available in the ED at this time, and I doubt any other [facility] requiring further screening and/or treatment in the ED prior to admission.   Final Clinical Impression(s) / ED  Diagnoses Final diagnoses:  Sore throat  Atypical chest pain  Acute nonintractable headache, unspecified headache type  Scarring of lung following radiation [facility])    Rx / DC Orders ED Discharge Orders          Ordered    amoxicillin-clavulanate (AUGMENTIN) 875-125 MG tablet  Every 12 hours        07/30/21 0254    HYDROcodone-acetaminophen (HYCET) 7.5-325 mg/15 ml solution  4 times daily PRN        07/30/21 0254    cyclobenzaprine (FLEXERIL) 10 MG tablet  2 times daily PRN        07/30/21 0254    lidocaine (XYLOCAINE) 2 % solution  As needed        07/30/21 0346             Joanne Gavel, PA-C 07/30/21 [id number]    Fatima Blank, MD 07/30/21 [phone]

## 2021-07-29 NOTE — ED Triage Notes (Signed)
Patient presents with complaints of chest pain, with radiation to shoulder and jaw. Patient is a lung cancer patient currently being treated with targeted therapy. Patient states the pain has increased significantly.

## 2021-07-30 ENCOUNTER — Emergency Department (HOSPITAL_COMMUNITY): Payer: 59

## 2021-07-30 ENCOUNTER — Observation Stay (HOSPITAL_COMMUNITY): Payer: 59

## 2021-07-30 ENCOUNTER — Other Ambulatory Visit (HOSPITAL_COMMUNITY): Payer: 59

## 2021-07-30 ENCOUNTER — Encounter (HOSPITAL_COMMUNITY): Payer: Self-pay | Admitting: Internal Medicine

## 2021-07-30 DIAGNOSIS — R1312 Dysphagia, oropharyngeal phase: Secondary | ICD-10-CM

## 2021-07-30 DIAGNOSIS — R079 Chest pain, unspecified: Secondary | ICD-10-CM | POA: Diagnosis present

## 2021-07-30 DIAGNOSIS — I2699 Other pulmonary embolism without acute cor pulmonale: Secondary | ICD-10-CM

## 2021-07-30 DIAGNOSIS — C7931 Secondary malignant neoplasm of brain: Secondary | ICD-10-CM | POA: Diagnosis not present

## 2021-07-30 DIAGNOSIS — C3491 Malignant neoplasm of unspecified part of right bronchus or lung: Secondary | ICD-10-CM

## 2021-07-30 DIAGNOSIS — R0781 Pleurodynia: Secondary | ICD-10-CM

## 2021-07-30 DIAGNOSIS — R519 Headache, unspecified: Secondary | ICD-10-CM | POA: Diagnosis not present

## 2021-07-30 DIAGNOSIS — R1310 Dysphagia, unspecified: Secondary | ICD-10-CM

## 2021-07-30 DIAGNOSIS — E871 Hypo-osmolality and hyponatremia: Secondary | ICD-10-CM

## 2021-07-30 DIAGNOSIS — J189 Pneumonia, unspecified organism: Secondary | ICD-10-CM

## 2021-07-30 DIAGNOSIS — H539 Unspecified visual disturbance: Secondary | ICD-10-CM

## 2021-07-30 DIAGNOSIS — E663 Overweight: Secondary | ICD-10-CM

## 2021-07-30 LAB — BASIC METABOLIC PANEL
Anion gap: 7 (ref 5–15)
BUN: 11 mg/dL (ref 8–23)
CO2: 28 mmol/L (ref 22–32)
Calcium: 9.1 mg/dL (ref 8.9–10.3)
Chloride: 108 mmol/L (ref 98–111)
Creatinine, Ser: 0.85 mg/dL (ref 0.44–1.00)
GFR, Estimated: >60 mL/min (ref >60)
Glucose, Bld: 91 mg/dL (ref 70–99)
Potassium: 3.5 mmol/L (ref 3.5–5.1)
Sodium: 143 mmol/L (ref 135–145)

## 2021-07-30 LAB — HEPATIC FUNCTION PANEL
ALT: 19 U/L (ref 0–44)
AST: 21 U/L (ref 15–41)
Albumin: 3.8 g/dL (ref 3.5–5.0)
Alkaline Phosphatase: 70 U/L (ref 38–126)
Bilirubin, Direct: 0.1 mg/dL (ref 0.0–0.2)
Indirect Bilirubin: 0.4 mg/dL (ref 0.3–0.9)
Total Bilirubin: 0.5 mg/dL (ref 0.3–1.2)
Total Protein: 6.7 g/dL (ref 6.5–8.1)

## 2021-07-30 LAB — RESP PANEL BY RT-PCR (FLU A&B, COVID) ARPGX2
Influenza A by PCR: NEGATIVE
Influenza B by PCR: NEGATIVE
SARS Coronavirus 2 by RT PCR: NEGATIVE

## 2021-07-30 LAB — TSH: TSH: 0.717 u[IU]/mL (ref 0.350–4.500)

## 2021-07-30 LAB — TROPONIN I (HIGH SENSITIVITY): Troponin I (High Sensitivity): 8 ng/L (ref <18)

## 2021-07-30 LAB — LIPASE, BLOOD: Lipase: 28 U/L (ref 11–51)

## 2021-07-30 MED ORDER — PROCHLORPERAZINE EDISYLATE 10 MG/2ML IJ SOLN
10.0000 mg | Freq: Once | INTRAMUSCULAR | Status: AC
  Administered 2021-07-30: 10 mg via INTRAVENOUS
  Filled 2021-07-30: qty 2

## 2021-07-30 MED ORDER — FENTANYL CITRATE PF 50 MCG/ML IJ SOSY
75.0000 ug | PREFILLED_SYRINGE | INTRAMUSCULAR | Status: AC | PRN
  Administered 2021-07-30 (×2): 75 ug via INTRAVENOUS
  Filled 2021-07-30 (×2): qty 2

## 2021-07-30 MED ORDER — HEPARIN BOLUS VIA INFUSION
4000.0000 [IU] | Freq: Once | INTRAVENOUS | Status: DC
  Filled 2021-07-30: qty 4000

## 2021-07-30 MED ORDER — ONDANSETRON HCL 4 MG/2ML IJ SOLN: 4.0000 mg | Freq: Four times a day (QID) | INTRAMUSCULAR | Status: DC | PRN

## 2021-07-30 MED ORDER — LIDOCAINE VISCOUS HCL 2 % MT SOLN
15.0000 mL | Freq: Once | OROMUCOSAL | Status: AC
  Administered 2021-07-30: 15 mL via ORAL
  Filled 2021-07-30: qty 15

## 2021-07-30 MED ORDER — KETOROLAC TROMETHAMINE 30 MG/ML IJ SOLN
30.0000 mg | Freq: Once | INTRAMUSCULAR | Status: AC
  Administered 2021-07-30: 30 mg via INTRAVENOUS
  Filled 2021-07-30: qty 1

## 2021-07-30 MED ORDER — ACETAMINOPHEN 325 MG PO TABS: 650.0000 mg | ORAL_TABLET | Freq: Four times a day (QID) | ORAL | Status: DC | PRN

## 2021-07-30 MED ORDER — OXYCODONE HCL 5 MG PO TABS
5.0000 mg | ORAL_TABLET | Freq: Three times a day (TID) | ORAL | Status: DC | PRN
  Administered 2021-07-30 – 2021-07-31 (×2): 5 mg via ORAL
  Filled 2021-07-30 (×2): qty 1

## 2021-07-30 MED ORDER — PREDNISONE 20 MG PO TABS: 40.0000 mg | ORAL_TABLET | Freq: Every day | ORAL | Status: DC

## 2021-07-30 MED ORDER — ONDANSETRON HCL 4 MG PO TABS: 4.0000 mg | ORAL_TABLET | Freq: Four times a day (QID) | ORAL | Status: DC | PRN

## 2021-07-30 MED ORDER — PREDNISONE 50 MG PO TABS
80.0000 mg | ORAL_TABLET | Freq: Every day | ORAL | Status: DC
  Administered 2021-07-31: 80 mg via ORAL
  Filled 2021-07-30: qty 1

## 2021-07-30 MED ORDER — CYCLOBENZAPRINE HCL 10 MG PO TABS: 10.0000 mg | ORAL_TABLET | Freq: Two times a day (BID) | ORAL | 0 refills | Status: DC | PRN

## 2021-07-30 MED ORDER — APIXABAN 5 MG PO TABS
10.0000 mg | ORAL_TABLET | Freq: Two times a day (BID) | ORAL | Status: DC
  Administered 2021-07-30 – 2021-07-31 (×3): 10 mg via ORAL
  Filled 2021-07-30: qty 2
  Filled 2021-07-30: qty 4
  Filled 2021-07-30: qty 2

## 2021-07-30 MED ORDER — PREDNISONE 20 MG PO TABS: 20.0000 mg | ORAL_TABLET | Freq: Every day | ORAL | Status: DC

## 2021-07-30 MED ORDER — HYDROCODONE-ACETAMINOPHEN 7.5-325 MG/15ML PO SOLN
10.0000 mL | Freq: Once | ORAL | Status: AC
Start: 2021-07-30 — End: 2021-07-30
  Administered 2021-07-30: 10 mL via ORAL
  Filled 2021-07-30: qty 15

## 2021-07-30 MED ORDER — APIXABAN 5 MG PO TABS: 5.0000 mg | ORAL_TABLET | Freq: Two times a day (BID) | ORAL | Status: DC

## 2021-07-30 MED ORDER — BENZONATATE 100 MG PO CAPS
100.0000 mg | ORAL_CAPSULE | Freq: Three times a day (TID) | ORAL | Status: DC | PRN
  Administered 2021-07-30: 100 mg via ORAL
  Filled 2021-07-30: qty 1

## 2021-07-30 MED ORDER — DIPHENHYDRAMINE HCL 50 MG/ML IJ SOLN
25.0000 mg | Freq: Once | INTRAMUSCULAR | Status: AC
  Administered 2021-07-30: 25 mg via INTRAVENOUS
  Filled 2021-07-30: qty 1

## 2021-07-30 MED ORDER — IOHEXOL 350 MG/ML SOLN
80.0000 mL | Freq: Once | INTRAVENOUS | Status: AC | PRN
  Administered 2021-07-30: 80 mL via INTRAVENOUS

## 2021-07-30 MED ORDER — AMOXICILLIN-POT CLAVULANATE 875-125 MG PO TABS: 1.0000 | ORAL_TABLET | Freq: Two times a day (BID) | ORAL | 0 refills | Status: AC

## 2021-07-30 MED ORDER — ALUM & MAG HYDROXIDE-SIMETH 200-200-20 MG/5ML PO SUSP
30.0000 mL | Freq: Once | ORAL | Status: AC
  Administered 2021-07-30: 30 mL via ORAL
  Filled 2021-07-30: qty 30

## 2021-07-30 MED ORDER — GADOBUTROL 1 MMOL/ML IV SOLN
7.0000 mL | Freq: Once | INTRAVENOUS | Status: AC | PRN
  Administered 2021-07-30: 7 mL via INTRAVENOUS

## 2021-07-30 MED ORDER — LIDOCAINE VISCOUS HCL 2 % MT SOLN: 15.0000 mL | OROMUCOSAL | 0 refills | Status: DC | PRN

## 2021-07-30 MED ORDER — HYDROCODONE-ACETAMINOPHEN 7.5-325 MG/15ML PO SOLN: 15.0000 mL | Freq: Four times a day (QID) | ORAL | 0 refills | Status: DC | PRN

## 2021-07-30 MED ORDER — SODIUM CHLORIDE 0.9 % IV BOLUS
500.0000 mL | Freq: Once | INTRAVENOUS | Status: AC
  Administered 2021-07-30: 500 mL via INTRAVENOUS

## 2021-07-30 MED ORDER — HEPARIN (PORCINE) 25000 UT/250ML-% IV SOLN: 1400.0000 [IU]/h | INTRAVENOUS | Status: DC

## 2021-07-30 MED ORDER — ACETAMINOPHEN 650 MG RE SUPP: 650.0000 mg | Freq: Four times a day (QID) | RECTAL | Status: DC | PRN

## 2021-07-30 NOTE — ED Notes (Signed)
Admitting provider at bedside.

## 2021-07-30 NOTE — Evaluation (Signed)
Clinical/Bedside Swallow Evaluation Patient Details  Name: Brittany Mccarty MRN: [id number] Date of Birth: 02/01/1961  Today's Date: 07/30/2021 Time: SLP Start Time (ACUTE ONLY): 1650 SLP Stop Time (ACUTE ONLY): 1705 SLP Time Calculation (min) (ACUTE ONLY): 15 min  Past Medical History:  Past Medical History:  Diagnosis Date   NSCL CA DX'D 04/07/20   Past Surgical History:  Past Surgical History:  Procedure Laterality Date   IR REMOVAL OF PLURAL CATH W/CUFF  04/24/2020   HPI:  Patient is a 60 y.o. 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 Patient reports chest pain, SOB, nonproductive cough for about 5 weeks. She has also developed constant dysphagia with solids and fluids. MRI did not reveal any new metastatic disease and no new intracranial abnormality.   Assessment / Plan / Recommendation Clinical Impression  Patient presents with what appears to be a primary esophageal dysphagia however cannot r/u a pharyngeal component. She describes globus sensation in throat which is worse with solid foods. She has sensation of getting choked during meals and reports that it seems worse in the evening. SLP did not observe any overt s/s aspiration or penetration with sips of thin liquids but she did report globus sensation with liquids as well. As patient is scheduled for EGD tomorrow, SLP will follow up next date after EGD to determine if objective swallow study (MBS) is warranted. SLP Visit Diagnosis: Dysphagia, unspecified (R13.10)    Aspiration Risk  No limitations;Mild aspiration risk    Diet Recommendation Thin liquid   Liquid Administration via: Straw;Cup Medication Administration: Whole meds with liquid Supervision: Patient able to self feed Postural Changes: Seated upright at 90  degrees;Remain upright for at least 30 minutes after po intake    Other  Recommendations Oral Care Recommendations: Oral care BID;Patient independent with oral care   Follow up Recommendations None      Frequency and Duration min 1 x/week  1 week       Prognosis Prognosis for Safe Diet Advancement: Good      Swallow Study   General Date of Onset: 07/30/21 HPI: Patient is a 60 y.o. 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 Patient reports chest pain, SOB, nonproductive cough for about 5 weeks. She has also developed constant dysphagia with solids and fluids. MRI did not reveal any new metastatic disease and no new intracranial abnormality. Type of Study: Bedside Swallow Evaluation Previous Swallow Assessment: none found Diet Prior to this Study: Thin liquids;Other (Comment) (clear liquids) Temperature Spikes Noted: No Respiratory Status: Room air History of Recent Intubation: No Behavior/Cognition: Pleasant mood;Alert;Cooperative Oral Cavity Assessment: Within Functional Limits Oral Care Completed by SLP: No Oral Cavity - Dentition: Adequate natural dentition Vision: Functional for self-feeding Self-Feeding Abilities: Able to feed self Patient Positioning: Upright in bed Baseline Vocal Quality: Normal Volitional Cough: Strong Volitional Swallow: Able to elicit    Oral/Motor/Sensory Function Overall Oral Motor/Sensory Function: Within functional limits   Ice Chips     Thin Liquid Thin Liquid: Impaired Presentation: Straw;Self Fed Pharyngeal  Phase Impairments: Other (comments) Other Comments: patient with c/o globus sensation    Nectar Thick     Honey Thick     Puree Puree: Not tested  Solid     Solid: Not tested      Sonia Baller, MA, CCC-SLP Speech Therapy

## 2021-07-30 NOTE — ED Notes (Signed)
Patient taken to MRI

## 2021-07-30 NOTE — H&P (Addendum)
History and Physical    Brittany Mccarty KNL:[id number] DOB: 1961-10-22 DOA: 07/29/2021  PCP: Patient, No Pcp Per (Inactive) Patient coming from: home  Chief Complaint: chest pain, SOB, cough, dysphagia, headaches and vision change  HPI: Brittany Mccarty is a 60 y.o. 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  Patient reports that she has had chest pain, shortness of breath and nonproductive cough for about 5 weeks.  Her chest pain was located to right-sided chest area, constant, aching and sharp pain, severe pain, radiating into her right jaw and neck.  Deep breathing and cough aggravated chest pain.  Resting alleviated chest pain.  She has been taking home oxycodone for her chest pain.  Her oncologist temporarily stopped her targeted therapy with Tagrisso as there was concern for drug-induced pneumonitis.  Per chart review, she has recently started a high-dose prednisone taper starting with 80 mg for 1 week followed by 40 mg for 1 week, followed by 20 mg for 1 week. She is in process of getting pulmonology referral as outpatient  Patient also reports that she has developed constant dysphagia with solid and fluids especially soda.  She describes that she would choke whenever she eats or drinks.   Patient also reports that she has developed headaches and left eye visual change over last week.  She describes as  " feeling as if I am looking through water."  She reports that she had brain metastasis and previously completed radiation therapy.  She is scheduled to have MRI brain on 07/31/2021 to evaluate brain metastasis.   Denies fevers, chills, nausea, vomiting, diarrhea, abdominal pain, dysuria or urinary frequency.   Review of Systems: As per HPI otherwise 10 point  review of systems negative.  Review of Systems Otherwise negative except as per HPI, including: General: Denies fever, chills, night sweats or unintended weight loss. Resp: positive for cough, shortness of breath. Cardiac: Denies palpitations, orthopnea, paroxysmal nocturnal dyspnea.Marland Kitchen  Positive for chest pain GI: Denies abdominal pain, nausea, vomiting, diarrhea or constipation.  Positive for dysphagia GU: Denies dysuria, frequency, hesitancy or incontinence MS: Denies muscle aches, joint pain or swelling Neuro: Positive for headache and left eye visual change Psych: Denies anxiety, depression, SI/HI/AVH Skin: Denies new rashes or lesions ID: Denies sick contacts, exotic exposures, travel  Past Medical History:  Diagnosis Date   NSCL CA DX'D 04/07/20    Past Surgical History:  Procedure Laterality Date   IR REMOVAL OF PLURAL CATH W/CUFF  04/24/2020    SOCIAL HISTORY:  reports that she has never smoked. She has never used smokeless tobacco. She reports that she does not currently use alcohol. She reports that she does not use drugs.  Allergies  Allergen Reactions   Other     no blood transfusions    Cefdinir Rash    FAMILY HISTORY: No family history on file.   Prior to Admission medications   Medication Sig Start Date End Date Taking? Authorizing Provider  amoxicillin-clavulanate (AUGMENTIN) 875-125 MG tablet Take 1 tablet by mouth every 12 (twelve) hours for 5 days. 07/30/21 08/04/21 Yes McDonald, Mia A, PA-C  benzonatate (TESSALON) 100 MG capsule Take 1 capsule (100 mg total) by mouth 3 (three) times daily as needed for cough. 07/28/21  Yes Heilingoetter, Cassandra L, PA-C  cyclobenzaprine (FLEXERIL) 10 MG tablet Take 1 tablet (10 mg  total) by mouth 2 (two) times daily as needed for muscle spasms. 07/30/21  Yes McDonald, Mia A, PA-C  HYDROcodone-acetaminophen (HYCET) 7.5-325 mg/15 ml solution Take 15 mLs by mouth 4 (four) times daily as needed for moderate pain. 07/30/21 07/30/22  Yes McDonald, Mia A, PA-C  ibuprofen (ADVIL) 200 MG tablet Take 200 mg by mouth every 6 (six) hours as needed for mild pain or moderate pain.   Yes [provider]  lidocaine (XYLOCAINE) 2 % solution Use as directed 15 mLs in the mouth or throat as needed for mouth pain. 07/30/21  Yes McDonald, Mia A, PA-C  omeprazole (PRILOSEC) 20 MG capsule Take 1 capsule (20 mg total) by mouth daily. 07/24/21  Yes Heilingoetter, Cassandra L, PA-C  ondansetron (ZOFRAN) 4 MG tablet Take 4 mg by mouth every 8 (eight) hours as needed for nausea or vomiting. 04/08/20  Yes [provider]  oxyCODONE (OXY IR/ROXICODONE) 5 MG immediate release tablet Take 1 tablet (5 mg total) by mouth 3 (three) times daily between meals as needed for severe pain. 06/27/21  Yes Curt Bears, MD  predniSONE (DELTASONE) 20 MG tablet Take 4 tablets (80 mg) daily for 1 week, followed by 2 tablets (40 mg) daily for 1 week, 1 tablet (20 mg) for 1 week. 07/24/21  Yes Heilingoetter, Cassandra L, PA-C  magnesium oxide (MAG-OX) 400 MG tablet Take 400 mg by mouth at bedtime.    [provider]  osimertinib mesylate (TAGRISSO) 80 MG tablet Take 1 tablet (80 mg total) by mouth daily. 06/08/21   Curt Bears, MD    Physical Exam: Vitals:   07/30/21 0330 07/30/21 0403 07/30/21 0420 07/30/21 0550  BP: (!) 159/134 (!) 191/123 (!) 129/116 (!) 154/99  Pulse: 67 61 65 98  Resp: 20 19 18 17   Temp:   98.6 F (37 C)   TempSrc:   Oral   SpO2: 98% 99% 98% 94%  Weight:      Height:          Constitutional: NAD, calm, comfortable Eyes: PERRL, lids and conjunctivae normal ENMT: Mucous membranes are moist. Posterior pharynx clear of any exudate or lesions.Normal dentition.  Neck: normal, supple, no masses, no thyromegaly Respiratory: clear to auscultation bilaterally, no wheezing, no crackles. Normal respiratory effort. No accessory muscle use.  Cardiovascular: Regular rate and rhythm, no murmurs / rubs / gallops. No  extremity edema. 2+ pedal pulses. No carotid bruits.  Abdomen: no tenderness, no masses palpated. No hepatosplenomegaly. Bowel sounds positive.  Musculoskeletal: no clubbing / cyanosis. No joint deformity upper and lower extremities. Good ROM, no contractures. Normal muscle tone.  Skin: no rashes, lesions, ulcers. No induration Neurologic: CN 2-12 grossly intact. Sensation intact, DTR normal. Strength 5/5 in all 4.  Psychiatric: Normal judgment and insight. Alert and oriented x 3. Normal mood.     Labs on Admission: I have personally reviewed following labs and imaging studies  CBC: Recent Labs  Lab 07/29/21 2228  WBC 9.0  HGB 13.6  HCT 40.9  MCV 92.5  PLT 294*   Basic Metabolic Panel: Recent Labs  Lab 07/29/21 2228  Mitsuo Budnick 131*  K 4.3  CL 99  CO2 22  GLUCOSE 113*  BUN 14  CREATININE 0.37*  CALCIUM 9.3   GFR: Estimated Creatinine Clearance: 77.1 mL/min (A) (by C-G formula based on SCr of 0.37 mg/dL (L)). Liver Function Tests: Recent Labs  Lab 07/29/21 2320  AST 21  ALT 19  ALKPHOS 70  BILITOT 0.5  PROT 6.7  ALBUMIN 3.8   Recent Labs  Lab 07/29/21 2320  LIPASE 28   No results for input(s): AMMONIA in the last 168 hours. Coagulation Profile: No results for input(s): INR, PROTIME in the last 168 hours. Cardiac Enzymes: No results for input(s): CKTOTAL, CKMB, CKMBINDEX, TROPONINI in the last 168 hours. BNP (last 3 results) No results for input(s): PROBNP in the last 8760 hours. HbA1C: No results for input(s): HGBA1C in the last 72 hours. CBG: No results for input(s): GLUCAP in the last 168 hours. Lipid Profile: No results for input(s): CHOL, HDL, LDLCALC, TRIG, CHOLHDL, LDLDIRECT in the last 72 hours. Thyroid Function Tests: Recent Labs    07/29/21 2320  TSH 0.717   Anemia Panel: No results for input(s): VITAMINB12, FOLATE, FERRITIN, TIBC, IRON, RETICCTPCT in the last 72 hours. Urine analysis: No results found for: COLORURINE, APPEARANCEUR, LABSPEC,  PHURINE, GLUCOSEU, HGBUR, BILIRUBINUR, KETONESUR, PROTEINUR, UROBILINOGEN, NITRITE, LEUKOCYTESUR Sepsis Labs: !!!!!!!!!!!!!!!!!!!!!!!!!!!!!!!!!!!!!!!!!!!! @LABRCNTIP (procalcitonin:4,lacticidven:4) ) Recent Results (from the past 240 hour(s))  Resp Panel by RT-PCR (Flu A&B, Covid) Nasopharyngeal Swab     Status: None   Collection Time: 07/30/21  6:01 AM   Specimen: Nasopharyngeal Swab; Nasopharyngeal(NP) swabs in vial transport medium  Result Value Ref Range Status   SARS Coronavirus 2 by RT PCR NEGATIVE NEGATIVE Final    Comment: (NOTE) SARS-CoV-2 target nucleic acids are NOT DETECTED.  The SARS-CoV-2 RNA is generally detectable in upper respiratory specimens during the acute phase of infection. The lowest concentration of SARS-CoV-2 viral copies this assay can detect is 138 copies/mL. A negative result does not preclude SARS-Cov-2 infection and should not be used as the sole basis for treatment or other patient management decisions. A negative result may occur with  improper specimen collection/handling, submission of specimen other than nasopharyngeal swab, presence of viral mutation(s) within the areas targeted by this assay, and inadequate number of viral copies(<138 copies/mL). A negative result must be combined with clinical observations, patient history, and epidemiological information. The expected result is Negative.  Fact Sheet for Patients:  EntrepreneurPulse.com.au  Fact Sheet for Healthcare Providers:  IncredibleEmployment.be  This test is no t yet approved or cleared by the [place] FDA and  has been authorized for detection and/or diagnosis of SARS-CoV-2 by FDA under an Emergency Use Authorization (EUA). This EUA will remain  in effect (meaning this test can be used) for the duration of the COVID-19 declaration under Section 564(b)(1) of the Act, 21 U.S.C.section 360bbb-3(b)(1), unless the authorization is terminated  or  revoked sooner.       Influenza A by PCR NEGATIVE NEGATIVE Final   Influenza B by PCR NEGATIVE NEGATIVE Final    Comment: (NOTE) The Xpert Xpress SARS-CoV-2/FLU/RSV plus assay is intended as an aid in the diagnosis of influenza from Nasopharyngeal swab specimens and should not be used as a sole basis for treatment. Nasal washings and aspirates are unacceptable for Xpert Xpress SARS-CoV-2/FLU/RSV testing.  Fact Sheet for Patients: EntrepreneurPulse.com.au  Fact Sheet for Healthcare Providers: IncredibleEmployment.be  This test is not yet approved or cleared by the [place] FDA and has been authorized for detection and/or diagnosis of SARS-CoV-2 by FDA under an Emergency Use Authorization (EUA). This EUA will remain in effect (meaning this test can be used) for the duration of the COVID-19 declaration under Section 564(b)(1) of the Act, 21 U.S.C. section 360bbb-3(b)(1), unless the authorization is terminated or revoked.  Performed at [facility], [place] [address], [place], [place] [address]      Radiological  Exams on Admission: DG Chest 2 View  Result Date: 07/29/2021 CLINICAL DATA:  Chest pain, history of lung cancer EXAM: CHEST - 2 VIEW COMPARISON:  CT chest dated 07/19/2021 FINDINGS: Postsurgical changes with volume loss in the right hemithorax. Additional mild hazy ground-glass opacity, better evaluated on CT, favoring radiation changes and/or infection. Left lung is clear. Small right pleural effusion. No pneumothorax. The heart is normal in size IMPRESSION: Postsurgical changes with volume loss in the right hemithorax. Superimposed ground-glass opacity in the right lung is better evaluated on recent CT, possibly reflecting radiation changes and/or infection. Electronically Signed   By: Julian Hy M.D.   On: 07/29/2021 22:07   CT Angio Chest PE W and/or Wo Contrast  Addendum Date: 07/30/2021   ADDENDUM REPORT:  07/30/2021 04:24 ADDENDUM: Upon re-review of CTA chest from 0023 hours today, the following findings are noted: Tiny low-density filling defect in a subsegmental, distal branch to the left lower lobe lateral basal segment seen on series 5, images 215 through 225. This appears primarily nonocclusive, and no other convincing pulmonary artery filling defect is identified. This was discussed by telephone with PA MIA MCDONALD on 07/30/2021 at 04:20. Electronically Signed   By: Genevie Ann M.D.   On: 07/30/2021 04:24   Result Date: 07/30/2021 CLINICAL DATA:  Chest pain EXAM: CT ANGIOGRAPHY CHEST WITH CONTRAST TECHNIQUE: Multidetector CT imaging of the chest was performed using the standard protocol during bolus administration of intravenous contrast. Multiplanar CT image reconstructions and MIPs were obtained to evaluate the vascular anatomy. CONTRAST:  6mL OMNIPAQUE IOHEXOL 350 MG/ML SOLN COMPARISON:  CT chest dated 07/19/2021 FINDINGS: Cardiovascular: Satisfactory opacification of the bilateral pulmonary arteries to the segmental level. Right lower lobe pulmonary arteries are diminutive. No evidence of pulmonary embolism. Although not tailored for evaluation of the thoracic aorta, there is no evidence thoracic aortic aneurysm or dissection. Heart is top-normal in size.  No pericardial effusion. Mediastinum/Nodes: No suspicious mediastinal lymphadenopathy. 2.3 cm calcified right paratracheal node (series 4/image 87), unchanged. Visualized thyroid is unremarkable. Lungs/Pleura: Bandlike opacity with volume loss in the medial right lower lobe (series 6/image 102), favoring radiation changes. Patchy/ground-glass opacities in the right lung, including in the posterior right upper lobe (series 6/image 87), with associated interlobular septal thickening. This appearance favors radiation changes, although infection is also possible. Stable nodular scarring in the anterior left upper lobe (series 6/image 61). Evaluation of the  lung parenchyma is constrained by respiratory motion. Within that constraint, there are no new/suspicious pulmonary nodules. Trace right pleural effusion.  No pneumothorax. Upper Abdomen: Visualized upper abdomen is grossly unremarkable. Musculoskeletal: Mild degenerative changes of the visualized thoracolumbar spine. Review of the MIP images confirms the above findings. IMPRESSION: No evidence of pulmonary embolism. Suspected radiation changes in the right hemithorax. Superimposed infection/pneumonia is possible, but is not favored. No interval change from recent CT. Electronically Signed: By: Julian Hy M.D. On: 07/30/2021 00:48     All images have been reviewed by me personally.  EKG: Independently reviewed.   Assessment/Plan Principal Problem:   Pulmonary embolism (HCC) Active Problems:   Adenocarcinoma of lung, right (HCC)   Pleuritic chest pain   Brain metastases (HCC)   Chest pain   Pneumonitis   Dysphagia   Bilateral headaches   Visual changes   Hyponatremia   Overweight   Assessment Plan  # small left sided PE # hx of PE completed Eliquis  CTPA showed Tiny low-density filling defect in a subsegmental, distal branch to the left  lower lobe lateral basal segment  - tele monitoring -CTPA results noted -LE Dopplers pending -Eliquis was started at the ED. Can discharge her on Eliquis and follow up with her oncology for duration of therapy as her PE is likely related to her cancer.    # Right sided chest pain and intermittent SOB and nonproductive cough concerning for pneumonitis.   She has had above symptoms for 5 weeks.  The characteristics of her chest pain is pleuritic pain. I doubt that her left sided small/tiny PE could explain all of her respiratory symptoms.      - Troponins are negative.  No ischemic change on EKG. -CTPA showed tiny LLL PE as well as RUL opacities favors radiation Changes  - Her right sided chest pain, SOB and cough correlates with CT finding  of RUL opacities which favors radiation changes per radiologist. - She is in no distress and not hypoxia or febrile. Normal WBC and COVID/Flu are negative. No evidence of infection. -Her oncologist is in the process of arranging outpatient pulmonology evaluation for possible drug induced pneumonitis from her cancer treatment.  She has recently started  high-dose prednisone taper and we will continue her prednisone regimen here.  -Agree that she should FU with pulmonology as outpatient. We could send referral when we discharge her.    #Dysphagia with solids and liquids #Headaches and left visual change She has developed above symptoms for about 1 week.  Given her history of brain metastasis, agree with MRI brain.   -No speech difficulties -will order barium swallow - will proceed with MRI brain W WO contrast today.  #  stage IV (T3, N2, M1c) non-small cell lung cancer with metastasis  -Follow-up with oncology as outpatient -  Her oncologist temporarily stopped her targeted therapy with Tagrisso as there was concern for drug-induced pneumonitis   # Hyponatremia  - Crystalyn Delia 131 which is mildly low than her baseline - she received NS IVF at the ED - FU BMP   Nutritional status  Body mass index is 29.29 kg/m. - overweight FU with PCP           DVT prophylaxis: Eliquis and SCD Code Status: Full code Family Communication: None at bedside Consults called: N/A Admission status: obs  Status is: Observation  The patient remains OBS appropriate and will d/c before 2 midnights.  Dispo: The patient is from: Home              Anticipated d/c is to: Home              Patient currently is not medically stable to d/c.   Difficult to place patient No       Time Spent: 65 minutes.  >50% of the time was devoted to discussing the patients care, assessment, plan and disposition with other care givers along with counseling the patient about the risks and benefits of treatment.    Charlann Lange  MD [facility]  If 7PM-7AM, please contact night-coverage   07/30/2021, 8:19 AM

## 2021-07-30 NOTE — Progress Notes (Signed)
ANTICOAGULATION CONSULT NOTE - Initial Consult  Pharmacy Consult for heparin Indication: pulmonary embolus  Allergies  Allergen Reactions   Other     no blood transfusions    Cefdinir Rash    Patient Measurements: Height: 5\' 5"  (165.1 cm) Weight: 79.8 kg (176 lb) IBW/kg (Calculated) : 57 Heparin Dosing Weight: 79.8kg  Vital Signs: Temp: 98.6 F (37 C) (08/28 0420) Temp Source: Oral (08/28 0420) BP: 129/116 (08/28 0420) Pulse Rate: 65 (08/28 0420)  Labs: Recent Labs    07/29/21 2228 07/29/21 2320  HGB 13.6  --   HCT 40.9  --   PLT 117*  --   CREATININE 0.37*  --   TROPONINIHS 5 8    Estimated Creatinine Clearance: 77.1 mL/min (A) (by C-G formula based on SCr of 0.37 mg/dL (L)).   Medical History: Past Medical History:  Diagnosis Date   NSCL CA DX'D 04/07/20     Assessment: 60 yo female presents with complaints of chest pain, with radiation to shoulder and jaw.  Pt currently being treated for lung cancer.  Pharmacy consulted to dose heparin for PE.  No prior AC noted.  Hgb 13.6, Plts 117, Scr 0.37 Baseline labs ordered stat   Goal of Therapy:  Heparin level 0.3-0.7 units/ml Monitor platelets by anticoagulation protocol: Yes   Plan:  Heparin bolus 4000 units x 1 Start heparin drip at 1400 units/hr Heparin level in 6 hours Daily CBC  Dolly Rias RPh 07/30/2021, 5:00 AM

## 2021-07-30 NOTE — ED Notes (Signed)
Pt transported to CT ?

## 2021-07-30 NOTE — Discharge Instructions (Addendum)
Thank you for allowing me to care for you today in the Emergency Department.    We are going to cover your symptoms with a short course of antibiotics.  Take 1 tablet of Augmentin 2 times daily for the next 5 days.  For muscle pain and spasms, you can take 1 tablet of Flexeril 2 times daily.  This medication can make you drowsy so use caution with taking it with other medications or substances that may make you sleepy.  You can swallow 15 mL of Hycet every 6 hours for moderate pain.  This medication contains hydrocodone, which is a narcotic.  You should not take it around the time that you your home Norco.  You can swallow 15 L of viscous lidocaine every 3 hours as needed for sore throat.  Please follow-up with your oncologist.  Continue taking prednisone as prescribed.  Please talk to your oncologist about continuing your prednisone taper.  You should not stop taking this medication abruptly as it can affect your adrenal system.  Return to the emergency department if you develop respiratory distress, if you have a choking episode and then developed shortness of breath, if you pass out, if you have severe, uncontrollable chest pain, if you have a severe headache with new numbness or weakness, or other new, concerning symptoms.

## 2021-07-31 ENCOUNTER — Encounter (HOSPITAL_COMMUNITY): Payer: Self-pay | Admitting: Internal Medicine

## 2021-07-31 ENCOUNTER — Other Ambulatory Visit: Payer: 59

## 2021-07-31 ENCOUNTER — Observation Stay (HOSPITAL_BASED_OUTPATIENT_CLINIC_OR_DEPARTMENT_OTHER): Payer: 59

## 2021-07-31 ENCOUNTER — Other Ambulatory Visit: Payer: Self-pay | Admitting: Physician Assistant

## 2021-07-31 ENCOUNTER — Other Ambulatory Visit: Payer: Self-pay | Admitting: Internal Medicine

## 2021-07-31 ENCOUNTER — Observation Stay (HOSPITAL_COMMUNITY): Payer: 59

## 2021-07-31 DIAGNOSIS — R519 Headache, unspecified: Secondary | ICD-10-CM | POA: Diagnosis not present

## 2021-07-31 DIAGNOSIS — C7931 Secondary malignant neoplasm of brain: Secondary | ICD-10-CM | POA: Diagnosis not present

## 2021-07-31 DIAGNOSIS — I2699 Other pulmonary embolism without acute cor pulmonale: Secondary | ICD-10-CM

## 2021-07-31 DIAGNOSIS — C3491 Malignant neoplasm of unspecified part of right bronchus or lung: Secondary | ICD-10-CM | POA: Diagnosis not present

## 2021-07-31 DIAGNOSIS — G893 Neoplasm related pain (acute) (chronic): Secondary | ICD-10-CM

## 2021-07-31 LAB — CBC
HCT: 43.4 % (ref 36.0–46.0)
Hemoglobin: 14.3 g/dL (ref 12.0–15.0)
MCH: 30.6 pg (ref 26.0–34.0)
MCHC: 32.9 g/dL (ref 30.0–36.0)
MCV: 92.7 fL (ref 80.0–100.0)
Platelets: 184 10*3/uL (ref 150–400)
RBC: 4.68 MIL/uL (ref 3.87–5.11)
RDW: 12.7 % (ref 11.5–15.5)
WBC: 6 10*3/uL (ref 4.0–10.5)
nRBC: 0 % (ref 0.0–0.2)

## 2021-07-31 LAB — BASIC METABOLIC PANEL
Anion gap: 7 (ref 5–15)
BUN: 7 mg/dL — ABNORMAL LOW (ref 8–23)
CO2: 29 mmol/L (ref 22–32)
Calcium: 9.4 mg/dL (ref 8.9–10.3)
Chloride: 99 mmol/L (ref 98–111)
Creatinine, Ser: 0.95 mg/dL (ref 0.44–1.00)
GFR, Estimated: >60 mL/min (ref >60)
Glucose, Bld: 86 mg/dL (ref 70–99)
Potassium: 4 mmol/L (ref 3.5–5.1)
Sodium: 135 mmol/L (ref 135–145)

## 2021-07-31 MED ORDER — OXYCODONE HCL 5 MG PO TABS: 5.0000 mg | ORAL_TABLET | Freq: Three times a day (TID) | ORAL | 0 refills | Status: DC | PRN

## 2021-07-31 MED ORDER — PANTOPRAZOLE SODIUM 40 MG PO TBEC
40.0000 mg | DELAYED_RELEASE_TABLET | Freq: Every day | ORAL | Status: DC
  Administered 2021-07-31: 40 mg via ORAL
  Filled 2021-07-31: qty 1

## 2021-07-31 MED ORDER — APIXABAN 5 MG PO TABS: ORAL_TABLET | ORAL | 0 refills | Status: DC

## 2021-07-31 NOTE — Plan of Care (Signed)
discharged

## 2021-07-31 NOTE — Progress Notes (Signed)
PT Cancellation Note/Screen  Patient Details Name: Brittany Mccarty MRN: [id number] DOB: 1960/12/30   Cancelled Treatment:    Reason Eval/Treat Not Completed: PT screened, no needs identified, will sign off (pt mobilized with RN in hallway and worked with OT. She is at [place] level for mobility and has no acute PT needs.)  Gwynneth Albright PT, DPT Acute Rehabilitation Services Office [phone] Pager [phone]

## 2021-07-31 NOTE — Progress Notes (Signed)
Bilateral lower extremity venous duplex has been completed. Preliminary results can be found in CV Proc through chart review.   07/31/21 12:06 PM Brittany Mccarty RVT

## 2021-07-31 NOTE — Discharge Summary (Signed)
Physician Discharge Summary  Zondra Lawlor MBW:[id number] DOB: 1961/04/28 DOA: 07/29/2021  PCP: Patient, No Pcp Per (Inactive)  Admit date: 07/29/2021 Discharge date: 07/31/2021  Admitted From: Home Disposition: Home  Recommendations for Outpatient Follow-up:  Follow up with PCP in 1-2 weeks Follow up with Medical Oncology Dr. Julien Nordmann on 08/16/21 Please obtain CMP/CBC, Mag, Phos in one week Please follow up on the following pending results:  Home Health: No  Equipment/Devices: None   Discharge Condition: Stable  CODE STATUS: FULL CODE  Diet recommendation:   Brief/Interim Summary: HPI per Dr. Charlann Lange on 07/30/21 Tanika Bracco is a 60 y.o. 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   Patient reports that she has had chest pain, shortness of breath and nonproductive cough for about 5 weeks.  Her chest pain was located to right-sided chest area, constant, aching and sharp pain, severe pain, radiating into her right jaw and neck.  Deep breathing and cough aggravated chest pain.  Resting alleviated chest pain.  She has been taking home oxycodone for her chest pain.  Her oncologist temporarily stopped her targeted therapy with Tagrisso as there was concern for drug-induced pneumonitis.  Per chart review, she has recently started a high-dose prednisone taper starting with 80 mg for 1 week followed by 40 mg for 1 week, followed by 20 mg for 1 week. She is in process of getting pulmonology referral as outpatient   Patient also reports that she has developed constant dysphagia with solid and fluids especially soda.  She describes that she would choke whenever she eats or drinks.    Patient also reports that she has developed headaches and left eye visual change over  last week.  She describes as  " feeling as if I am looking through water."  She reports that she had brain metastasis and previously completed radiation therapy.  She is scheduled to have MRI brain on 07/31/2021 to evaluate brain metastasis.    Denies fevers, chills, nausea, vomiting, diarrhea, abdominal pain, dysuria or urinary frequency.   **Interim History Patient was found to have a PE started on anticoagulation.  She ambulated without issues and did not desaturate.  She improved with her respiratory status and she underwent a esophagram and SLP recommended continuing regular diet with thin liquids.  She was continued on her antibiotics that she was sent on for her sore throat as an outpatient from the ED.  She will need to follow-up with her PCP and medical oncologist and understands agrees with the plan of care.  Discharge Diagnoses:  Principal Problem:   Pulmonary embolism (HCC) Active Problems:   Adenocarcinoma of lung, right (HCC)   Pleuritic chest pain   Brain metastases (HCC)   Chest pain   Pneumonitis   Dysphagia   Bilateral headaches   Visual changes   Hyponatremia   Overweight  Acute small left-sided PE History of PE -She was admitted to the telemetry unit as an observation -CT a of the chest PE protocol showed tiny low-density filling defect in the subsegmental, distal branch of the left lower lobe lateral basal segment -Lower extremity Dopplers were done and are negative -She started on Eliquis in the ED and will continue on Eliquis at discharge -She received a free 30-day card -Patient can get an echocardiogram done in outpatient setting -She is to follow-up  with oncologist and likely will need lifelong anticoagulation given that this is PE is likely related to cancer   Right-sided chest pain and intermittent shortness of breath Nonproductive cough concerning for pneumonitis -She has had the symptoms for 5 weeks -The characteristics of her chest pain is pleuritic  pain -Unlikely the cause of her pain is secondary to her PE given that it is small -Troponins are negative and there is no ischemic change on EKG -Right-sided chest pain and shortness of cough correlates with CT findings of right upper lobe opacities favor radiation changes per radiology -We will continue empiric antibiotics started by the ED -She is currently not hypoxic or in any respiratory distress and has normal WBC -COVID and flu testing are negative -Her oncologist in the process of arranging outpatient pulmonary evaluation for possible drug-induced pneumonitis from her cancer treatment -She is started on a recently high-dose prednisone taper I will continue prednisone her -She should follow-up with pulmonary as an outpatient and will defer to her oncologist to make that appointment  Dysphagia with solids and liquids Headache with visual changes -She developed these symptoms for about a week and given her history of brain metastasis.  Brain MRI -She had no speech difficulties -Barium swallow done and SLP recommending regular diet with thin liquid -MRI of the brain with and without contrast showed "Satisfactory post SRS appearance of the brain. Stable subtle right superior perirolandic cortical enhancement with mild associated hemosiderin. Resolved small focus of left corona radiata white matter  enhancement.No new metastatic disease identified. 2. Advanced nonspecific white matter disease. No new intracranial abnormality." -Follow-up in outpatient setting and will continue to monitor -Headache is improved and she had no trouble eating prior to discharge  Stage IV (T3N2M1c) non-small cell lung cancer with metastasis -Follow-up with oncology in outpatient setting and she sees Dr. Earlie Server; she has an appointment with him on [phone] -Her oncologist temporarily stopped her targeted therapy with Tagrisso there was concern for drug-induced pneumonitis -Follow-up with pulmonary and continue with  Augmentin and prednisone  Hyponatremia -Sodium is improved -She received IV fluid hydration -Continue monitor and trend and repeat CMP in the outpatient setting  Overweight -Complicates overall prognosis and care -Estimated body mass index is 29.62 kg/m as calculated from the following:   Height as of this encounter: 5\' 5"  (1.651 m).   Weight as of this encounter: 80.7 kg. -Weight Loss and Dietary Counseling given   Discharge Instructions  Discharge Instructions     Call MD for:  difficulty breathing, headache or visual disturbances   Complete by: As directed    Call MD for:  extreme fatigue   Complete by: As directed    Call MD for:  hives   Complete by: As directed    Call MD for:  persistant dizziness or light-headedness   Complete by: As directed    Call MD for:  persistant nausea and vomiting   Complete by: As directed    Call MD for:  redness, tenderness, or signs of infection (pain, swelling, redness, odor or green/yellow discharge around incision site)   Complete by: As directed    Call MD for:  severe uncontrolled pain   Complete by: As directed    Call MD for:  temperature >100.4   Complete by: As directed    Diet - low sodium heart healthy   Complete by: As directed    Discharge instructions   Complete by: As directed    You were cared for by a  hospitalist during your hospital stay. If you have any questions about your discharge medications or the care you received while you were in the hospital after you are discharged, you can call the unit and ask to speak with the hospitalist on call if the hospitalist that took care of you is not available. Once you are discharged, your primary care physician will handle any further medical issues. Please note that NO REFILLS for any discharge medications will be authorized once you are discharged, as it is imperative that you return to your primary care physician (or establish a relationship with a primary care physician if you  do not have one) for your aftercare needs so that they can reassess your need for medications and monitor your lab values.  Follow up with PCP and Medical Oncology within 1-2 weeks. Take all medications as prescribed. If symptoms change or worsen please return to the ED for evaluation   Increase activity slowly   Complete by: As directed       Allergies as of 07/31/2021       Reactions   Other    no blood transfusions    Cefdinir Rash        Medication List     STOP taking these medications    Advil 200 MG tablet Generic drug: ibuprofen   azithromycin 250 MG tablet Commonly known as: Zithromax       TAKE these medications    amoxicillin-clavulanate 875-125 MG tablet Commonly known as: AUGMENTIN Take 1 tablet by mouth every 12 (twelve) hours for 5 days.   apixaban 5 MG Tabs tablet Commonly known as: ELIQUIS Take 2 tablets (10 mg total) by mouth 2 (two) times daily for 7 days, THEN 1 tablet (5 mg total) 2 (two) times daily for 28 days. Start taking on: July 31, 2021   benzonatate 100 MG capsule Commonly known as: TESSALON Take 1 capsule (100 mg total) by mouth 3 (three) times daily as needed for cough.   cyclobenzaprine 10 MG tablet Commonly known as: FLEXERIL Take 1 tablet (10 mg total) by mouth 2 (two) times daily as needed for muscle spasms.   lidocaine 2 % solution Commonly known as: XYLOCAINE Use as directed 15 mLs in the mouth or throat as needed for mouth pain.   magnesium oxide 400 MG tablet Commonly known as: MAG-OX Take 400 mg by mouth at bedtime.   omeprazole 20 MG capsule Commonly known as: PRILOSEC Take 1 capsule (20 mg total) by mouth daily.   ondansetron 4 MG tablet Commonly known as: ZOFRAN Take 4 mg by mouth every 8 (eight) hours as needed for nausea or vomiting.   osimertinib mesylate 80 MG tablet Commonly known as: Tagrisso Take 1 tablet (80 mg total) by mouth daily.   oxyCODONE 5 MG immediate release tablet Commonly known as:  Oxy IR/ROXICODONE Take 1 tablet (5 mg total) by mouth 3 (three) times daily between meals as needed for severe pain.   predniSONE 20 MG tablet Commonly known as: DELTASONE Take 4 tablets (80 mg) daily for 1 week, followed by 2 tablets (40 mg) daily for 1 week, 1 tablet (20 mg) for 1 week.        Follow-up Information     Curt Bears, MD Follow up on 08/16/2021.   Specialty: Oncology Why: [place] up within 1-2 weeks and appointment scheduled for 08/16/21 Contact information: [place] [address] [phone]  Allergies  Allergen Reactions   Other     no blood transfusions    Cefdinir Rash   Consultations: None  Procedures/Studies: DG Chest 1 View  Result Date: 07/10/2021 CLINICAL DATA:  Pt c/o new onset worsening sob and cough especially on exertion in the past month, hx IV non-small cell lung cancer, adenocarcinoma and presented with large infrahilar mass in addition to mediastinal lymphadenopathy EXAM: CHEST  1 VIEW COMPARISON:  04/25/2020 FINDINGS: Moderate right pleural effusion. Consolidation/atelectasis at the right lung base. The interstitial opacities seen on prior study on the right have improved. Left lung clear. Heart size within normal limits. Right paratracheal fullness possible adenopathy as before. No pneumothorax. Visualized bones unremarkable. IMPRESSION: Right pleural effusion with adjacent consolidation/atelectasis at the lung base. Electronically Signed   By: Lucrezia Europe M.D.   On: 07/10/2021 09:00   DG Chest 2 View  Result Date: 07/29/2021 CLINICAL DATA:  Chest pain, history of lung cancer EXAM: CHEST - 2 VIEW COMPARISON:  CT chest dated 07/19/2021 FINDINGS: Postsurgical changes with volume loss in the right hemithorax. Additional mild hazy ground-glass opacity, better evaluated on CT, favoring radiation changes and/or infection. Left lung is clear. Small right pleural effusion. No pneumothorax. The heart is  normal in size IMPRESSION: Postsurgical changes with volume loss in the right hemithorax. Superimposed ground-glass opacity in the right lung is better evaluated on recent CT, possibly reflecting radiation changes and/or infection. Electronically Signed   By: Julian Hy M.D.   On: 07/29/2021 22:07   CT Chest W Contrast  Result Date: 07/19/2021 CLINICAL DATA:  Non-small-cell lung cancer.  Restaging. EXAM: CT CHEST, ABDOMEN, AND PELVIS WITH CONTRAST TECHNIQUE: Multidetector CT imaging of the chest, abdomen and pelvis was performed following the standard protocol during bolus administration of intravenous contrast. CONTRAST:  67mL OMNIPAQUE IOHEXOL 350 MG/ML SOLN COMPARISON:  05/16/2021 FINDINGS: CT CHEST FINDINGS Cardiovascular: The heart size is normal. No substantial pericardial effusion. No thoracic aortic aneurysm. Mediastinum/Nodes: 2.4 x 2.4 cm calcified right paratracheal node stable since prior (2.6 x 2.5 cm). No new mediastinal lymphadenopathy. Calcified nodal tissue in the right hilum is stable. Left hilum unremarkable. The esophagus has normal imaging features. There is no axillary lymphadenopathy. Lungs/Pleura: Scarring in the extreme right lung apex is similar to prior although there is progressive ground-glass opacity in interlobular septal thickening in the high right upper lobe posteriorly (image 47/6). 6 mm perifissural nodule in the right lung (74/6) was present previously and is similar in size although appears more confluent today. Retrocardiac right lower lobe consolidative opacity is similar to prior although the bronchiectatic airways in this region seen on the previous study have become impacted in the interval. Similar bandlike ground-glass opacity noted peripheral left upper lobe. Several tiny 2-3 mm pulmonary nodules identified bilaterally are unchanged since prior. No substantial pleural effusion. Musculoskeletal: No worrisome lytic or sclerotic osseous abnormality. CT ABDOMEN  PELVIS FINDINGS Hepatobiliary: No suspicious focal abnormality within the liver parenchyma. There is no evidence for gallstones, gallbladder wall thickening, or pericholecystic fluid. No intrahepatic or extrahepatic biliary dilation. Pancreas: No focal mass lesion. No dilatation of the main duct. No intraparenchymal cyst. No peripancreatic edema. Spleen: Is stable in the interval. Adrenals/Urinary Tract: No adrenal nodule or mass. Cortical scarring noted lower pole left kidney. Right kidney unremarkable No evidence for hydroureter. The urinary bladder appears normal for the degree of distention. Stomach/Bowel: Stomach is unremarkable. No gastric wall thickening. No evidence of outlet obstruction. Duodenum is normally positioned as is the ligament  of Treitz. 4-5 cm length of jejunal intussusception identified on 57/2 and has nearly resolved by renal delay imaging 3 minutes later, consistent with transient process. Ileal loops unremarkable. The terminal ileum is normal. The appendix is not well visualized, but there is no edema or inflammation in the region of the cecum. No gross colonic mass. No colonic wall thickening. Vascular/Lymphatic: No abdominal aortic aneurysm. No abdominal aortic atherosclerotic calcification. There is no gastrohepatic or hepatoduodenal ligament lymphadenopathy. No retroperitoneal or mesenteric lymphadenopathy. No pelvic sidewall lymphadenopathy. Reproductive: Unremarkable. Other: No intraperitoneal free fluid. Musculoskeletal: No worrisome lytic or sclerotic osseous abnormality. IMPRESSION: 1. Interval progression of ground-glass opacity and interlobular septal thickening in the high right upper lobe posteriorly. This is nonspecific and may be related to an infectious/inflammatory etiology. As lymphangitic tumor cannot be excluded, close attention recommended. 2. Otherwise stable exam. No other new or progressive findings in the chest, abdomen, or pelvis. 3. Stable appearance of calcified  right paratracheal node. Tiny pulmonary nodules remain unchanged. 4. 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 Attention on follow-up recommended. Electronically Signed   By: Misty Stanley M.D.   On: 07/19/2021 12:19   CT Angio Chest PE W and/or Wo Contrast  Addendum Date: 07/30/2021   ADDENDUM REPORT: 07/30/2021 04:24 ADDENDUM: Upon re-review of CTA chest from 0023 hours today, the following findings are noted: Tiny low-density filling defect in a subsegmental, distal branch to the left lower lobe lateral basal segment seen on series 5, images 215 through 225. This appears primarily nonocclusive, and no other convincing pulmonary artery filling defect is identified. This was discussed by telephone with PA MIA MCDONALD on 07/30/2021 at 04:20. Electronically Signed   By: Genevie Ann M.D.   On: 07/30/2021 04:24   Result Date: 07/30/2021 CLINICAL DATA:  Chest pain EXAM: CT ANGIOGRAPHY CHEST WITH CONTRAST TECHNIQUE: Multidetector CT imaging of the chest was performed using the standard protocol during bolus administration of intravenous contrast. Multiplanar CT image reconstructions and MIPs were obtained to evaluate the vascular anatomy. CONTRAST:  50mL OMNIPAQUE IOHEXOL 350 MG/ML SOLN COMPARISON:  CT chest dated 07/19/2021 FINDINGS: Cardiovascular: Satisfactory opacification of the bilateral pulmonary arteries to the segmental level. Right lower lobe pulmonary arteries are diminutive. No evidence of pulmonary embolism. Although not tailored for evaluation of the thoracic aorta, there is no evidence thoracic aortic aneurysm or dissection. Heart is top-normal in size.  No pericardial effusion. Mediastinum/Nodes: No suspicious mediastinal lymphadenopathy. 2.3 cm calcified right paratracheal node (series 4/image 87), unchanged. Visualized thyroid is unremarkable.  Lungs/Pleura: Bandlike opacity with volume loss in the medial right lower lobe (series 6/image 102), favoring radiation changes. Patchy/ground-glass opacities in the right lung, including in the posterior right upper lobe (series 6/image 87), with associated interlobular septal thickening. This appearance favors radiation changes, although infection is also possible. Stable nodular scarring in the anterior left upper lobe (series 6/image 61). Evaluation of the lung parenchyma is constrained by respiratory motion. Within that constraint, there are no new/suspicious pulmonary nodules. Trace right pleural effusion.  No pneumothorax. Upper Abdomen: Visualized upper abdomen is grossly unremarkable. Musculoskeletal: Mild degenerative changes of the visualized thoracolumbar spine. Review of the MIP images confirms the above findings. IMPRESSION: No evidence of pulmonary embolism. Suspected radiation changes in the right hemithorax. Superimposed infection/pneumonia is possible, but is not favored. No interval change from recent CT. Electronically Signed: By: Julian Hy  M.D. On: 07/30/2021 00:48   MR BRAIN W WO CONTRAST  Result Date: 07/30/2021 CLINICAL DATA:  60 year old female with non-small cell lung cancer. Suspected brain metastases with waxing and waning treatment effect on prior MRIs. Status post SRS to these 2 lesions in June. Restaging. EXAM: MRI HEAD WITHOUT AND WITH CONTRAST TECHNIQUE: Multiplanar, multiecho pulse sequences of the brain and surrounding structures were obtained without and with intravenous contrast. CONTRAST:  8mL GADAVIST GADOBUTROL 1 MMOL/ML IV SOLN COMPARISON:  Brain MRI 05/12/2021 and earlier. FINDINGS: Brain: Petechial enhancement at the right perirolandic cortex on series 16, image 139 is stable since June. Trace hemosiderin and minimal cortical T2 and FLAIR hyperintensity with no regional edema or mass effect. Previously seen subtle left frontal lobe white matter enhancement just  superior to the left lateral ventricle does not persist. No new abnormal enhancement identified.  No dural thickening. Widely scattered subcortical and also patchy, confluent periventricular white matter T2 and FLAIR hyperintensity in the brain appears stable. No progression or areas suspicious for vasogenic edema today. No superimposed restricted diffusion to suggest acute infarction. No midline shift, mass effect, ventriculomegaly, extra-axial collection or acute intracranial hemorrhage. Cervicomedullary junction and pituitary are within normal limits. No other chronic cerebral blood products identified. Vascular: Major intracranial vascular flow voids are stable. Mild generalized intracranial artery tortuosity. The major dural venous sinuses are enhancing and appear to be patent. Skull and upper cervical spine: Stable, negative visible cervical spine. Visualized bone marrow signal is within normal limits. Sinuses/Orbits: Stable, negative. Other: Stable mild mastoid effusions. Negative visible orbits and scalp. IMPRESSION: 1. Satisfactory post SRS appearance of the brain. Stable subtle right superior perirolandic cortical enhancement with mild associated hemosiderin. Resolved small focus of left corona radiata white matter enhancement. No new metastatic disease identified. 2. Advanced nonspecific white matter disease. No new intracranial abnormality. Electronically Signed   By: Genevie Ann M.D.   On: 07/30/2021 11:15   [place] CHEST (PLEURAL EFFUSION)  Result Date: 07/12/2021 CLINICAL DATA:  , Lung carcinoma, shortness of breath, right pleural effusion suspected on chest radiography EXAM: CHEST ULTRASOUND COMPARISON:  Radiograph from previous day FINDINGS: No significant pleural effusion bilaterally. IMPRESSION: No significant pleural effusion seen on ultrasound. Thoracentesis deferred. Electronically Signed   By: Lucrezia Europe M.D.   On: 07/12/2021 07:28   CT Abdomen Pelvis W Contrast  Result Date:  07/19/2021 CLINICAL DATA:  Non-small-cell lung cancer.  Restaging. EXAM: CT CHEST, ABDOMEN, AND PELVIS WITH CONTRAST TECHNIQUE: Multidetector CT imaging of the chest, abdomen and pelvis was performed following the standard protocol during bolus administration of intravenous contrast. CONTRAST:  44mL OMNIPAQUE IOHEXOL 350 MG/ML SOLN COMPARISON:  05/16/2021 FINDINGS: CT CHEST FINDINGS Cardiovascular: The heart size is normal. No substantial pericardial effusion. No thoracic aortic aneurysm. Mediastinum/Nodes: 2.4 x 2.4 cm calcified right paratracheal node stable since prior (2.6 x 2.5 cm). No new mediastinal lymphadenopathy. Calcified nodal tissue in the right hilum is stable. Left hilum unremarkable. The esophagus has normal imaging features. There is no axillary lymphadenopathy. Lungs/Pleura: Scarring in the extreme right lung apex is similar to prior although there is progressive ground-glass opacity in interlobular septal thickening in the high right upper lobe posteriorly (image 47/6). 6 mm perifissural nodule in the right lung (74/6) was present previously and is similar in size although appears more confluent today. Retrocardiac right lower lobe consolidative opacity is similar to prior although the bronchiectatic airways in this region seen on the previous study have become impacted in the interval. Similar bandlike ground-glass  opacity noted peripheral left upper lobe. Several tiny 2-3 mm pulmonary nodules identified bilaterally are unchanged since prior. No substantial pleural effusion. Musculoskeletal: No worrisome lytic or sclerotic osseous abnormality. CT ABDOMEN PELVIS FINDINGS Hepatobiliary: No suspicious focal abnormality within the liver parenchyma. There is no evidence for gallstones, gallbladder wall thickening, or pericholecystic fluid. No intrahepatic or extrahepatic biliary dilation. Pancreas: No focal mass lesion. No dilatation of the main duct. No intraparenchymal cyst. No peripancreatic edema.  Spleen: Is stable in the interval. Adrenals/Urinary Tract: No adrenal nodule or mass. Cortical scarring noted lower pole left kidney. Right kidney unremarkable No evidence for hydroureter. The urinary bladder appears normal for the degree of distention. Stomach/Bowel: Stomach is unremarkable. No gastric wall thickening. No evidence of outlet obstruction. Duodenum is normally positioned as is the ligament of Treitz. 4-5 cm length of jejunal intussusception identified on 57/2 and has nearly resolved by renal delay imaging 3 minutes later, consistent with transient process. Ileal loops unremarkable. The terminal ileum is normal. The appendix is not well visualized, but there is no edema or inflammation in the region of the cecum. No gross colonic mass. No colonic wall thickening. Vascular/Lymphatic: No abdominal aortic aneurysm. No abdominal aortic atherosclerotic calcification. There is no gastrohepatic or hepatoduodenal ligament lymphadenopathy. No retroperitoneal or mesenteric lymphadenopathy. No pelvic sidewall lymphadenopathy. Reproductive: Unremarkable. Other: No intraperitoneal free fluid. Musculoskeletal: No worrisome lytic or sclerotic osseous abnormality. IMPRESSION: 1. Interval progression of ground-glass opacity and interlobular septal thickening in the high right upper lobe posteriorly. This is nonspecific and may be related to an infectious/inflammatory etiology. As lymphangitic tumor cannot be excluded, close attention recommended. 2. Otherwise stable exam. No other new or progressive findings in the chest, abdomen, or pelvis. 3. Stable appearance of calcified right paratracheal node. Tiny pulmonary nodules remain unchanged. 4. 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 Attention on follow-up recommended. Electronically Signed   By: Misty Stanley M.D.    On: 07/19/2021 12:19   VAS [place] LOWER EXTREMITY VENOUS (DVT)  Result Date: 07/31/2021  Lower Venous DVT Study Patient Name:  [facility]  Date of Exam:   07/31/2021 Medical Rec #: [id number]           Accession #:    [id number] Date of Birth: [birth year]           Patient Gender: F Patient Age:   21 years Exam Location:  [facility] Procedure:      VAS [place] LOWER EXTREMITY VENOUS (DVT) Referring Phys: NA LI --------------------------------------------------------------------------------  Indications: Pulmonary embolism.  Risk Factors: Cancer. Limitations: Poor ultrasound/tissue interface. Comparison Study: No prior studies. Performing Technologist: Oliver Hum RVT  Examination Guidelines: A complete evaluation includes B-mode imaging, spectral Doppler, color Doppler, and power Doppler as needed of all accessible portions of each vessel. Bilateral testing is considered an integral part of a complete examination. Limited examinations for reoccurring indications may be performed as noted. The reflux portion of the exam is performed with the patient in reverse Trendelenburg.  +---------+---------------+---------+-----------+----------+--------------+ RIGHT    CompressibilityPhasicitySpontaneityPropertiesThrombus Aging +---------+---------------+---------+-----------+----------+--------------+ CFV      Full           Yes      Yes                                 +---------+---------------+---------+-----------+----------+--------------+ SFJ  Full                                                        +---------+---------------+---------+-----------+----------+--------------+ FV Prox  Full                                                        +---------+---------------+---------+-----------+----------+--------------+ FV Mid   Full                                                        +---------+---------------+---------+-----------+----------+--------------+ FV  DistalFull                                                        +---------+---------------+---------+-----------+----------+--------------+ PFV      Full                                                        +---------+---------------+---------+-----------+----------+--------------+ POP      Full           Yes      Yes                                 +---------+---------------+---------+-----------+----------+--------------+ PTV      Full                                                        +---------+---------------+---------+-----------+----------+--------------+ PERO     Full                                                        +---------+---------------+---------+-----------+----------+--------------+   +---------+---------------+---------+-----------+----------+--------------+ LEFT     CompressibilityPhasicitySpontaneityPropertiesThrombus Aging +---------+---------------+---------+-----------+----------+--------------+ CFV      Full           Yes      Yes                                 +---------+---------------+---------+-----------+----------+--------------+ SFJ      Full                                                        +---------+---------------+---------+-----------+----------+--------------+  FV Prox  Full                                                        +---------+---------------+---------+-----------+----------+--------------+ FV Mid   Full                                                        +---------+---------------+---------+-----------+----------+--------------+ FV DistalFull                                                        +---------+---------------+---------+-----------+----------+--------------+ PFV      Full                                                        +---------+---------------+---------+-----------+----------+--------------+ POP      Full           Yes      Yes                                  +---------+---------------+---------+-----------+----------+--------------+ PTV      Full                                                        +---------+---------------+---------+-----------+----------+--------------+ PERO     Full                                                        +---------+---------------+---------+-----------+----------+--------------+     Summary: RIGHT: - There is no evidence of deep vein thrombosis in the lower extremity.  - No cystic structure found in the popliteal fossa.  LEFT: - There is no evidence of deep vein thrombosis in the lower extremity.  - No cystic structure found in the popliteal fossa.  *See table(s) above for measurements and observations. Electronically signed by Servando Snare MD on 07/31/2021 at 5:33:11 PM.    Final    DG ESOPHAGUS W SINGLE CM (SOL OR THIN BA)  Result Date: 07/31/2021 CLINICAL DATA:  Dysphagia. EXAM: ESOPHOGRAM/BARIUM SWALLOW TECHNIQUE: Single contrast examination was performed using thick and thin density barium and 13 mm barium tablet. FLUOROSCOPY TIME:  Radiation Exposure Index (if provided by the fluoroscopic device): 10.6 mGy. COMPARISON:  None. FINDINGS: No definite mass or stricture is noted. No hiatal hernia or reflux is noted. Barium tablet passed through esophagus and into stomach without difficulty or delay. IMPRESSION: No significant abnormality seen in the esophagus. Electronically Signed   By: Jeneen Rinks  Murlean Caller M.D.   On: 07/31/2021 09:01     Subjective: Seen and examined at bedside and she is feeling much better and not as winded.  States that she was able to ambulate short of breath.  States that she is actually able to sleep as well.  No nausea or vomiting.  Felt well and able to tolerate her diet.  No other concerns or complaints at this time and she is stable to be discharged and follow-up with her primary oncologist and she has an appointment with him on 08/16/2021.  Discharge  Exam: Vitals:   07/31/21 0323 07/31/21 1147  BP: (!) 152/92 (!) 153/101  Pulse: 71 80  Resp:  18  Temp: 98.6 F (37 C) 98.1 F (36.7 C)  SpO2: 96% 97%   Vitals:   07/30/21 1900 07/30/21 2231 07/31/21 0323 07/31/21 1147  BP: (!) 149/99 (!) 161/104 (!) 152/92 (!) 153/101  Pulse: 82 97 71 80  Resp: 16   18  Temp: 98.7 F (37.1 C) 98.8 F (37.1 C) 98.6 F (37 C) 98.1 F (36.7 C)  TempSrc: Oral Oral Oral Oral  SpO2: 98% 97% 96% 97%  Weight:      Height:       General: Pt is alert, awake, not in acute distress Cardiovascular: RRR, S1/S2 +, no rubs, no gallops Respiratory: Diminished bilaterally, no wheezing, no rhonchi; unlabored breathing Abdominal: Soft, NT, distended secondary habitus, bowel sounds + Extremities: Trace edema, no cyanosis  The results of significant diagnostics from this hospitalization (including imaging, microbiology, ancillary and laboratory) are listed below for reference.    Microbiology: Recent Results (from the past 240 hour(s))  Resp Panel by RT-PCR (Flu A&B, Covid) Nasopharyngeal Swab     Status: None   Collection Time: 07/30/21  6:01 AM   Specimen: Nasopharyngeal Swab; Nasopharyngeal(NP) swabs in vial transport medium  Result Value Ref Range Status   SARS Coronavirus 2 by RT PCR NEGATIVE NEGATIVE Final    Comment: (NOTE) SARS-CoV-2 target nucleic acids are NOT DETECTED.  The SARS-CoV-2 RNA is generally detectable in upper respiratory specimens during the acute phase of infection. The lowest concentration of SARS-CoV-2 viral copies this assay can detect is 138 copies/mL. A negative result does not preclude SARS-Cov-2 infection and should not be used as the sole basis for treatment or other patient management decisions. A negative result may occur with  improper specimen collection/handling, submission of specimen other than nasopharyngeal swab, presence of viral mutation(s) within the areas targeted by this assay, and inadequate number of  viral copies(<138 copies/mL). A negative result must be combined with clinical observations, patient history, and epidemiological information. The expected result is Negative.  Fact Sheet for Patients:  EntrepreneurPulse.com.au  Fact Sheet for Healthcare Providers:  IncredibleEmployment.be  This test is no t yet approved or cleared by the [place] FDA and  has been authorized for detection and/or diagnosis of SARS-CoV-2 by FDA under an Emergency Use Authorization (EUA). This EUA will remain  in effect (meaning this test can be used) for the duration of the COVID-19 declaration under Section 564(b)(1) of the Act, 21 U.S.C.section 360bbb-3(b)(1), unless the authorization is terminated  or revoked sooner.       Influenza A by PCR NEGATIVE NEGATIVE Final   Influenza B by PCR NEGATIVE NEGATIVE Final    Comment: (NOTE) The Xpert Xpress SARS-CoV-2/FLU/RSV plus assay is intended as an aid in the diagnosis of influenza from Nasopharyngeal swab specimens and should not be used as a sole  basis for treatment. Nasal washings and aspirates are unacceptable for Xpert Xpress SARS-CoV-2/FLU/RSV testing.  Fact Sheet for Patients: EntrepreneurPulse.com.au  Fact Sheet for Healthcare Providers: IncredibleEmployment.be  This test is not yet approved or cleared by the [place] FDA and has been authorized for detection and/or diagnosis of SARS-CoV-2 by FDA under an Emergency Use Authorization (EUA). This EUA will remain in effect (meaning this test can be used) for the duration of the COVID-19 declaration under Section 564(b)(1) of the Act, 21 U.S.C. section 360bbb-3(b)(1), unless the authorization is terminated or revoked.  Performed at [facility], [place] [address] [place], [place] [address]     Labs: BNP (last 3 results) No results for input(s): BNP in the last 8760 hours. Basic Metabolic  Panel: Recent Labs  Lab 07/29/21 2228 07/30/21 0818 07/31/21 0343  NA 131* 143 135  K 4.3 3.5 4.0  CL 99 108 99  CO2 22 28 29   GLUCOSE 113* 91 86  BUN 14 11 7*  CREATININE 0.37* 0.85 0.95  CALCIUM 9.3 9.1 9.4   Liver Function Tests: Recent Labs  Lab 07/29/21 2320  AST 21  ALT 19  ALKPHOS 70  BILITOT 0.5  PROT 6.7  ALBUMIN 3.8   Recent Labs  Lab 07/29/21 2320  LIPASE 28   No results for input(s): AMMONIA in the last 168 hours. CBC: Recent Labs  Lab 07/29/21 2228 07/31/21 0343  WBC 9.0 6.0  HGB 13.6 14.3  HCT 40.9 43.4  MCV 92.5 92.7  PLT 117* 184   Cardiac Enzymes: No results for input(s): CKTOTAL, CKMB, CKMBINDEX, TROPONINI in the last 168 hours. BNP: Invalid input(s): POCBNP CBG: No results for input(s): GLUCAP in the last 168 hours. D-Dimer No results for input(s): DDIMER in the last 72 hours. Hgb A1c No results for input(s): HGBA1C in the last 72 hours. Lipid Profile No results for input(s): CHOL, HDL, LDLCALC, TRIG, CHOLHDL, LDLDIRECT in the last 72 hours. Thyroid function studies Recent Labs    07/29/21 2320  TSH 0.717   Anemia work up No results for input(s): VITAMINB12, FOLATE, FERRITIN, TIBC, IRON, RETICCTPCT in the last 72 hours. Urinalysis No results found for: COLORURINE, APPEARANCEUR, [place], [place], GLUCOSEU, [place], [place], [place], PROTEINUR, UROBILINOGEN, NITRITE, LEUKOCYTESUR Sepsis Labs Invalid input(s): PROCALCITONIN,  WBC,  LACTICIDVEN Microbiology Recent Results (from the past 240 hour(s))  Resp Panel by RT-PCR (Flu A&B, Covid) Nasopharyngeal Swab     Status: None   Collection Time: 07/30/21  6:01 AM   Specimen: Nasopharyngeal Swab; Nasopharyngeal(NP) swabs in vial transport medium  Result Value Ref Range Status   SARS Coronavirus 2 by RT PCR NEGATIVE NEGATIVE Final    Comment: (NOTE) SARS-CoV-2 target nucleic acids are NOT DETECTED.  The SARS-CoV-2 RNA is generally detectable in upper respiratory specimens  during the acute phase of infection. The lowest concentration of SARS-CoV-2 viral copies this assay can detect is 138 copies/mL. A negative result does not preclude SARS-Cov-2 infection and should not be used as the sole basis for treatment or other patient management decisions. A negative result may occur with  improper specimen collection/handling, submission of specimen other than nasopharyngeal swab, presence of viral mutation(s) within the areas targeted by this assay, and inadequate number of viral copies(<138 copies/mL). A negative result must be combined with clinical observations, patient history, and epidemiological information. The expected result is Negative.  Fact Sheet for Patients:  EntrepreneurPulse.com.au  Fact Sheet for Healthcare Providers:  IncredibleEmployment.be  This test is no t yet approved or  cleared by the [place] and  has been authorized for detection and/or diagnosis of SARS-CoV-2 by FDA under an Emergency Use Authorization (EUA). This EUA will remain  in effect (meaning this test can be used) for the duration of the COVID-19 declaration under Section 564(b)(1) of the Act, 21 U.S.C.section 360bbb-3(b)(1), unless the authorization is terminated  or revoked sooner.       Influenza A by PCR NEGATIVE NEGATIVE Final   Influenza B by PCR NEGATIVE NEGATIVE Final    Comment: (NOTE) The Xpert Xpress SARS-CoV-2/FLU/RSV plus assay is intended as an aid in the diagnosis of influenza from Nasopharyngeal swab specimens and should not be used as a sole basis for treatment. Nasal washings and aspirates are unacceptable for Xpert Xpress SARS-CoV-2/FLU/RSV testing.  Fact Sheet for Patients: EntrepreneurPulse.com.au  Fact Sheet for Healthcare Providers: IncredibleEmployment.be  This test is not yet approved or cleared by the [place] FDA and has been authorized for detection  and/or diagnosis of SARS-CoV-2 by FDA under an Emergency Use Authorization (EUA). This EUA will remain in effect (meaning this test can be used) for the duration of the COVID-19 declaration under Section 564(b)(1) of the Act, 21 U.S.C. section 360bbb-3(b)(1), unless the authorization is terminated or revoked.  Performed at [facility], [place] [address] [place], [place] [address]    Time coordinating discharge: 35 minutes  SIGNED:  Kerney Elbe, DO [facility] 07/31/2021, 8:58 PM Pager is on [place]  If 7PM-7AM, please contact night-coverage [email or web address]

## 2021-07-31 NOTE — Progress Notes (Signed)
  Speech Language Pathology Treatment: Dysphagia  Patient Details Name: Brittany Mccarty MRN: [id number] DOB: Jun 29, 1961 Today's Date: 07/31/2021 Time: 0315-9458 SLP Time Calculation (min) (ACUTE ONLY): 15 min  Assessment / Plan / Recommendation Clinical Impression  Patient seen for dysphagia tx with focus on trials of advanced solids as well as determination if objective swallow study was needed. Patient reports that her cough is less frequent and overall she feels better. SLP observed patient with puree solids, Dys 3 solids, thin liquids. She did not exhibit any overt s/s aspiration or penetration and did not complain of any discomfort or globus sensation. She was pleased that she was able to tolerate solids. SLP educated patient on easing back into intake of solids by avoiding foods that are dry, tough, etc and to drink fluids prior to and during PO intake. SLP also recommended patient contact her PCP if she is experiencing any swallowing difficulty after about 5-7 days and she could request an outpatient MBS. (SLP does not feel inpatient MBS is needed at this time)    HPI HPI: Patient is a 60 y.o. 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 Patient reports chest pain, SOB, nonproductive cough for about 5 weeks. She has also developed constant dysphagia with solids and fluids. MRI did not reveal any new metastatic disease and no new intracranial abnormality.      SLP Plan  Discharge SLP treatment due to (comment) (goals met, education complete)       Recommendations  Diet recommendations: Regular;Thin liquid Liquids provided via: Cup;Straw Medication Administration: Whole meds with liquid Supervision: Patient able to self feed Postural Changes and/or Swallow Maneuvers: Seated upright 90  degrees                Oral Care Recommendations: Oral care BID;Patient independent with oral care Follow up Recommendations: Other (comment) (recommended patient contact her PCP if after 5-7 days she is experiencing any further swallowing difficulties and request OP MBS) SLP Visit Diagnosis: Dysphagia, unspecified (R13.10) Plan: Discharge SLP treatment due to (comment) (goals met, education complete)       Sonia Baller, MA, CCC-SLP Speech Therapy

## 2021-07-31 NOTE — Evaluation (Signed)
Occupational Therapy Evaluation Patient Details Name: Brittany Mccarty MRN: [id number] DOB: [birth year] Today's Date: 07/31/2021    History of Present Illness Patient is a 60 year old female who presented to the hosptial with chest pain, vision change, SOB and cough. Patient was found to have small left  PE. PMH:  stage IV non small cell lung cancer with metastasis   Clinical Impression   Patient evaluated by Occupational Therapy with no further acute OT needs identified. All education has been completed and the patient has no further questions. Patient was able to participate in ADLs at baseline with O2 saturation maintaining above 94% on RA. See below for any follow-up Occupational Therapy or equipment needs. OT is signing off. Thank you for this referral.     Follow Up Recommendations  No OT follow up    Equipment Recommendations  None recommended by OT    Recommendations for Other Services       Precautions / Restrictions Restrictions Weight Bearing Restrictions: No      Mobility Bed Mobility Overal bed mobility: Modified Independent                  Transfers Overall transfer level: Modified independent Equipment used: None             General transfer comment: patient was able to maintain O2 saturation during mobility above 95% on RA.    Balance Overall balance assessment: Needs assistance Sitting-balance support: Feet supported Sitting balance-Leahy Scale: Good     Standing balance support: No upper extremity supported;During functional activity Standing balance-Leahy Scale: Good                             ADL either performed or assessed with clinical judgement   ADL Overall ADL's : Modified independent                                       General ADL Comments: patient was able to complete all ADL tasks in room with O2 saturation between 94%-97% on RA.patient was educated on reacher recommendation and wearing  shoes with backs on them to reduce shuffeling when completng functional mobility. patient reported being at baseline at this time.     Vision Patient Visual Report: No change from baseline Additional Comments: patient reported it has cleared up since inital ER visit     Perception     Praxis      Pertinent Vitals/Pain Pain Assessment: 0-10 Pain Score: 3  Pain Location: mid back over spine Pain Descriptors / Indicators: Cramping Pain Intervention(s): Monitored during session     Hand Dominance Right   Extremity/Trunk Assessment Upper Extremity Assessment Upper Extremity Assessment: Overall WFL for tasks assessed   Lower Extremity Assessment Lower Extremity Assessment: Overall WFL for tasks assessed   Cervical / Trunk Assessment Cervical / Trunk Assessment: Normal   Communication Communication Communication: No difficulties   Cognition Arousal/Alertness: Awake/alert Behavior During Therapy: WFL for tasks assessed/performed Overall Cognitive Status: Within Functional Limits for tasks assessed                                     General Comments       Exercises     Shoulder Instructions      Home Living Family/patient expects  to be discharged to:: Private residence Living Arrangements: Spouse/significant other Available Help at Discharge: Family;Available 24 hours/day Type of Home: House Home Access: Stairs to enter [employer] of Steps: stoop to enter   Home Layout: Multi-level Alternate Level Stairs-Number of Steps: 10 to middle level and 12 to upper level Alternate Level Stairs-Rails: Can reach both Bathroom Shower/Tub: Tub/shower unit         Home Equipment: None          Prior Functioning/Environment Level of Independence: Independent                 OT Problem List: Pain      OT Treatment/Interventions:      OT Goals(Current goals can be found in the care plan section) Acute Rehab OT Goals Patient Stated  Goal: to go home today OT Goal Formulation: All assessment and education complete, DC therapy  OT Frequency:     Barriers to D/C:            Co-evaluation              AM-PAC OT "6 Clicks" Daily Activity     Outcome Measure Help from another person eating meals?: None Help from another person taking care of personal grooming?: None Help from another person toileting, which includes using toliet, bedpan, or urinal?: None Help from another person bathing (including washing, rinsing, drying)?: None Help from another person to put on and taking off regular upper body clothing?: None Help from another person to put on and taking off regular lower body clothing?: None 6 Click Score: 24   End of Session Nurse Communication: Other (comment) (nurse cleared patient to participate)  Activity Tolerance: Patient tolerated treatment well Patient left: in bed;with call bell/phone within reach  OT Visit Diagnosis: Unsteadiness on feet (R26.81)                Time: 0459-9774 OT Time Calculation (min): 17 min Charges:  OT General Charges $OT Visit: 1 Visit OT Evaluation $OT Eval Low Complexity: 1 Low  Jackelyn Poling OTR/L, MS Acute Rehabilitation Department Office# [phone] Pager# [phone]   [place] 07/31/2021, 1:16 PM

## 2021-07-31 NOTE — Progress Notes (Signed)
Patient able to ambulate around half of hallway with 02 sat in place ranging from 94-98% on room air, no SOB.  OT also present during ambulation.

## 2021-08-01 ENCOUNTER — Telehealth: Payer: Self-pay

## 2021-08-01 ENCOUNTER — Encounter: Payer: Self-pay | Admitting: Internal Medicine

## 2021-08-01 NOTE — Telephone Encounter (Signed)
Pts husband request pts PULM referral be redirected to [facility] at fax [phone].  Referral including facesheet and last OV note with imaging studies has been faxed to [facility] at number provided per this request.

## 2021-08-03 ENCOUNTER — Telehealth: Payer: Self-pay | Admitting: Medical Oncology

## 2021-08-03 ENCOUNTER — Other Ambulatory Visit: Payer: Self-pay | Admitting: Internal Medicine

## 2021-08-03 DIAGNOSIS — C3491 Malignant neoplasm of unspecified part of right bronchus or lung: Secondary | ICD-10-CM

## 2021-08-03 MED ORDER — FENTANYL 25 MCG/HR TD PT72: 1.0000 | MEDICATED_PATCH | TRANSDERMAL | 0 refills | Status: DC

## 2021-08-03 NOTE — Telephone Encounter (Signed)
I returned Ed's call re: Brittany Mccarty's  "excruciating pain " R mid back and and ant chest.  The pain is waking her up at 2 am ,. She is not able to sleep. 6:30 am today was her last dose .  She is requesting something stronger to help her  control her pain .  Mohamed to advise.

## 2021-08-03 NOTE — Telephone Encounter (Signed)
Husband notified that Dr. Julien Nordmann ordered a MRI lumbar and thoracic spine and he sent a prescription for Fentanyl patch to preferred pharmacy. I gave husband the number to Central scheduling to f/u appt for MRI.

## 2021-08-04 ENCOUNTER — Telehealth: Payer: Self-pay | Admitting: Medical Oncology

## 2021-08-04 NOTE — Telephone Encounter (Signed)
Fentanyl requires PA . Contacted [facility] to expedite PA due to poor pain control.

## 2021-08-04 NOTE — Telephone Encounter (Signed)
[facility] stated PA done for Fentanyl. Ed notified.

## 2021-08-09 ENCOUNTER — Ambulatory Visit (HOSPITAL_COMMUNITY)
Admission: RE | Admit: 2021-08-09 | Discharge: 2021-08-09 | Disposition: A | Discharge status: Home / Self Care | Payer: 59 | Source: Ambulatory Visit | Attending: Internal Medicine | Admitting: Internal Medicine

## 2021-08-09 ENCOUNTER — Other Ambulatory Visit: Payer: Self-pay

## 2021-08-09 DIAGNOSIS — C3491 Malignant neoplasm of unspecified part of right bronchus or lung: Secondary | ICD-10-CM

## 2021-08-09 MED ORDER — GADOBUTROL 1 MMOL/ML IV SOLN
8.0000 mL | Freq: Once | INTRAVENOUS | Status: AC | PRN
  Administered 2021-08-09: 8 mL via INTRAVENOUS

## 2021-08-10 ENCOUNTER — Telehealth: Payer: Self-pay

## 2021-08-10 NOTE — Telephone Encounter (Signed)
Pts daughter called stating the pt needs a refill of Oxycodone 5mg .  She shared that pt has been taking more than prescribed because the Fentanyl 80mcg patch is not working very well.  Discussed with Dr. Julien Nordmann who advised that he will increase Fentanyl to 35mcg and will refill her Oxycodone but pt absolutely cannot take more than what is prescribed to her. Given pt has some of the 43mcg patches left, she can place two of them on until she has used them up and her next refill with be for one 2mcg patch.  I have called pts daughter back and advised of this. Pt was present for the call and both expressed understanding of this information.

## 2021-08-11 ENCOUNTER — Ambulatory Visit (INDEPENDENT_AMBULATORY_CARE_PROVIDER_SITE_OTHER): Payer: 59 | Admitting: Emergency Medicine

## 2021-08-11 ENCOUNTER — Telehealth: Payer: Self-pay

## 2021-08-11 ENCOUNTER — Other Ambulatory Visit: Payer: Self-pay | Admitting: Physician Assistant

## 2021-08-11 ENCOUNTER — Other Ambulatory Visit: Payer: Self-pay

## 2021-08-11 ENCOUNTER — Encounter: Payer: Self-pay | Admitting: Emergency Medicine

## 2021-08-11 VITALS — BP 124/68 | HR 84 | Temp 98.1°F | Ht 64.0 in | Wt 173.0 lb

## 2021-08-11 DIAGNOSIS — R911 Solitary pulmonary nodule: Secondary | ICD-10-CM

## 2021-08-11 DIAGNOSIS — I2699 Other pulmonary embolism without acute cor pulmonale: Secondary | ICD-10-CM

## 2021-08-11 DIAGNOSIS — C3491 Malignant neoplasm of unspecified part of right bronchus or lung: Secondary | ICD-10-CM

## 2021-08-11 DIAGNOSIS — J189 Pneumonia, unspecified organism: Secondary | ICD-10-CM | POA: Diagnosis not present

## 2021-08-11 DIAGNOSIS — G893 Neoplasm related pain (acute) (chronic): Secondary | ICD-10-CM

## 2021-08-11 DIAGNOSIS — R059 Cough, unspecified: Secondary | ICD-10-CM

## 2021-08-11 MED ORDER — FENTANYL 50 MCG/HR TD PT72: 1.0000 | MEDICATED_PATCH | TRANSDERMAL | 0 refills | Status: DC

## 2021-08-11 MED ORDER — PROMETHAZINE-CODEINE 6.25-10 MG/5ML PO SYRP: 5.0000 mL | ORAL_SOLUTION | Freq: Four times a day (QID) | ORAL | 0 refills | Status: DC | PRN

## 2021-08-11 NOTE — Progress Notes (Signed)
Subjective:    Patient ID: Brittany Mccarty, female    DOB: [birth year], 60 y.o.   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  Followed by Dr. Julien Nordmann, Dr. Lisbeth Renshaw.  Has undergone SRS to the brain completed 05/2021.  Is on Tagrisso since 04/2020.  She also has a history of pulmonary embolism on Eliquis.  She was admitted in late August with chest discomfort, dyspnea, cough.  She had dysphagia of solids and thin liquids, had choking whenever she would swallow.  No overt pneumonia, new infiltrate or local progression noted on CT chest.  She was noted to have hyponatremia Na131.  She reports that she began to have some increased cough and pain in the chest / back about 7 weeks ago. CT chest with some increased posterior RUL GGI. The Tagrisso was held, received steroids and abx x 2. She has also begun to develop swallowing difficulty, proximal dysphagia. Having sore throat, difficulty talking. She started omeprazole 2 weeks ago. No congestion drainage. Tessalon helped some.   CT-PA chest 07/30/2021 reviewed by me showed some nonocclusive distal left lower lobe PA clot, bandlike opacity in medial right lower lobe volume loss postradiation, patchy right groundglass opacities in the posterior right upper lobe associated with interlobular septal thickening and consistent with radiation change.  Trace right pleural effusion.  Stable compared with her prior CT from 10 days earlier.   Esophagram 8/29 without any notable abnormality. SLP > no overt aspiration or penetration seen.   Review of Systems As per HPI  Past Medical History:  Diagnosis Date   NSCL CA DX'D 04/07/20  Pulmonary embolism on Eliquis  No family history on file.   Social History   Socioeconomic History   Marital status: Married    Spouse name:  Not on file   Number of children: Not on file   Years of education: Not on file   Highest education level: Not on file  Occupational History   Not on file  Tobacco Use   Smoking status: Never   Smokeless tobacco: Never  Substance and Sexual Activity   Alcohol use: Not Currently   Drug use: Never   Sexual activity: Not on file  Other Topics Concern   Not on file  Social History Narrative   Not on file   Social Determinants of Health   Financial Resource Strain: Not on file  Food Insecurity: Not on file  Transportation Needs: Not on file  Physical Activity: Not on file  Stress: Not on file  Social Connections: Not on file  Intimate Partner Violence: Not on file     Allergies  Allergen Reactions   Other     no blood transfusions    Cefdinir Rash     No facility-administered medications prior to visit.   Outpatient Medications Prior to Visit  Medication Sig Dispense Refill   apixaban (ELIQUIS) 5 MG TABS tablet Take 2 tablets (10 mg total) by mouth 2 (two) times daily for 7 days, THEN 1 tablet (5 mg total) 2 (two) times daily for 28 days. (Patient taking differently: 51m orally daily currently) 84 tablet 0   benzonatate (TESSALON) 100 MG capsule Take 1 capsule (100 mg total) by mouth 3 (three) times daily as needed for cough. 30 capsule 1   cyclobenzaprine (FLEXERIL) 10 MG tablet Take 1 tablet (10  mg total) by mouth 2 (two) times daily as needed for muscle spasms. 20 tablet 0   lidocaine (XYLOCAINE) 2 % solution Use as directed 15 mLs in the mouth or throat as needed for mouth pain. (Patient not taking: No sig reported) 100 mL 0   omeprazole (PRILOSEC) 20 MG capsule Take 1 capsule (20 mg total) by mouth daily. 30 capsule 1   ondansetron (ZOFRAN) 4 MG tablet Take 4 mg by mouth every 8 (eight) hours as needed for nausea or vomiting.     osimertinib mesylate (TAGRISSO) 80 MG tablet Take 1 tablet (80 mg total) by mouth daily. 30 tablet 2   oxyCODONE (OXY IR/ROXICODONE) 5 MG  immediate release tablet Take 1 tablet (5 mg total) by mouth 3 (three) times daily between meals as needed for severe pain. 45 tablet 0   predniSONE (DELTASONE) 20 MG tablet Take 4 tablets (80 mg) daily for 1 week, followed by 2 tablets (40 mg) daily for 1 week, 1 tablet (20 mg) for 1 week. (Patient not taking: Reported on 08/14/2021) 49 tablet 0   fentaNYL (DURAGESIC) 25 MCG/HR Place 1 patch onto the skin every 3 (three) days. 5 patch 0   magnesium oxide (MAG-OX) 400 MG tablet Take 400 mg by mouth at bedtime.          Objective:   Physical Exam Vitals:   08/11/21 1033  BP: 124/68  Pulse: 84  Temp: 98.1 F (36.7 C)  TempSrc: Oral  SpO2: 100%  Weight: 173 lb (78.5 kg)  Height: $Remove'5\' 4"'KsGczVQ$  (1.626 m)    Gen: Pleasant, well-nourished, in no distress,  normal affect  ENT: No lesions,  mouth clear,  oropharynx clear, no postnasal drip  Neck: No JVD, no stridor  Lungs: No use of accessory muscles, no crackles or wheezing on normal respiration, no wheeze on forced expiration  Cardiovascular: RRR, heart sounds normal, no murmur or gallops, no peripheral edema  Musculoskeletal: No deformities, no cyanosis or clubbing  Neuro: alert, awake, non focal  Skin: Warm, no lesions or rash      Assessment & Plan:   Pneumonitis Multifocal right-sided infiltrates of unclear etiology, has been empirically treated for possible medication induced pneumonitis (Tagrisso) with corticosteroids, also empirically with antibiotics.  Question whether this may be due to chronic recurrent aspiration given her dysphagia although her esophagram in the hospital was reassuring.  No clear evidence for opportunistic infection.  Plan to repeat her CT chest later this month.  If the infiltrates persist then I think bronchoscopy, BAL, transbronchial biopsies to rule out lymphangitic spread would be appropriate.  Needs to continue swallowing precautions to avoid any aspiration.   We will plan to repeat your CT scan of the  chest without contrast in late September.  Depending on the results we will talk about whether you might benefit from bronchoscopy and lung tissue biopsy. Follow with Dr Lamonte Sakai next available after your CT scan so that we can review.  Pulmonary embolism ([place]) On anticoagulation  Adenocarcinoma of lung, right (HCC) Apparent progression of disease, restarting Tagrisso and follow with Dr. Julien Nordmann.  Question whether she will start on another regimen, possibly standard chemotherapy  Cough Possibly multifactorial, certainly her pulmonary infiltrate may be a contributor.  Consider also contribution of her chronic intermittent aspiration, LPR, GERD.  Increase your omeprazole to 20 mg twice a day.  Take this medication 1 hour around food. Please start Phenergan codeine cough syrup 5-10 cc every 6 hours as you needed for cough suppression.  You can still use your Tessalon Perles up to every 6 hours if needed for cough suppression. Continue to practice swallowing precautions to avoid any aspiration especially in the evening when your symptoms are most bothersome.   Baltazar Apo, MD, PhD 08/16/2021, 3:03 PM [facility] [phone] or if no answer before 7:00PM call [phone] For any issues after 7:00PM please call eLink [phone]

## 2021-08-11 NOTE — Telephone Encounter (Signed)
Notified Patient of Prior Authorization Approval for Fentanyl 50 mcg Patch. Medication is authorized from 08/11/2021 through 08/10/2022

## 2021-08-11 NOTE — Patient Instructions (Addendum)
Increase your omeprazole to 20 mg twice a day.  Take this medication 1 hour around food. Please start Phenergan codeine cough syrup 5-10 cc every 6 hours as you needed for cough suppression. You can still use your Tessalon Perles up to every 6 hours if needed for cough suppression. Continue to practice swallowing precautions to avoid any aspiration especially in the evening when your symptoms are most bothersome. We will plan to repeat your CT scan of the chest without contrast in late September.  Depending on the results we will talk about whether you might benefit from bronchoscopy and lung tissue biopsy. Follow with Dr Lamonte Sakai next available after your CT scan so that we can review.

## 2021-08-14 ENCOUNTER — Emergency Department (HOSPITAL_COMMUNITY): Payer: 59

## 2021-08-14 ENCOUNTER — Inpatient Hospital Stay (HOSPITAL_COMMUNITY)
Admission: EM | Admit: 2021-08-14 | Discharge: 2021-08-31 | DRG: 981 | Disposition: A | Discharge status: Home Health | Payer: 59 | Attending: Internal Medicine | Admitting: Internal Medicine

## 2021-08-14 ENCOUNTER — Encounter (HOSPITAL_COMMUNITY): Payer: Self-pay | Admitting: Emergency Medicine

## 2021-08-14 ENCOUNTER — Other Ambulatory Visit: Payer: Self-pay

## 2021-08-14 ENCOUNTER — Telehealth: Payer: Self-pay

## 2021-08-14 DIAGNOSIS — C3491 Malignant neoplasm of unspecified part of right bronchus or lung: Principal | ICD-10-CM | POA: Diagnosis present

## 2021-08-14 DIAGNOSIS — R0602 Shortness of breath: Secondary | ICD-10-CM | POA: Diagnosis not present

## 2021-08-14 DIAGNOSIS — C7951 Secondary malignant neoplasm of bone: Secondary | ICD-10-CM | POA: Diagnosis present

## 2021-08-14 DIAGNOSIS — J9601 Acute respiratory failure with hypoxia: Secondary | ICD-10-CM | POA: Diagnosis not present

## 2021-08-14 DIAGNOSIS — C787 Secondary malignant neoplasm of liver and intrahepatic bile duct: Secondary | ICD-10-CM | POA: Diagnosis present

## 2021-08-14 DIAGNOSIS — R062 Wheezing: Secondary | ICD-10-CM | POA: Diagnosis not present

## 2021-08-14 DIAGNOSIS — Z888 Allergy status to other drugs, medicaments and biological substances status: Secondary | ICD-10-CM

## 2021-08-14 DIAGNOSIS — R1310 Dysphagia, unspecified: Secondary | ICD-10-CM

## 2021-08-14 DIAGNOSIS — F419 Anxiety disorder, unspecified: Secondary | ICD-10-CM | POA: Diagnosis present

## 2021-08-14 DIAGNOSIS — Z20822 Contact with and (suspected) exposure to covid-19: Secondary | ICD-10-CM | POA: Diagnosis present

## 2021-08-14 DIAGNOSIS — R030 Elevated blood-pressure reading, without diagnosis of hypertension: Secondary | ICD-10-CM

## 2021-08-14 DIAGNOSIS — I2699 Other pulmonary embolism without acute cor pulmonale: Secondary | ICD-10-CM | POA: Diagnosis present

## 2021-08-14 DIAGNOSIS — E43 Unspecified severe protein-calorie malnutrition: Secondary | ICD-10-CM | POA: Insufficient documentation

## 2021-08-14 DIAGNOSIS — C7931 Secondary malignant neoplasm of brain: Secondary | ICD-10-CM | POA: Diagnosis present

## 2021-08-14 DIAGNOSIS — I471 Supraventricular tachycardia: Secondary | ICD-10-CM | POA: Diagnosis present

## 2021-08-14 DIAGNOSIS — K5900 Constipation, unspecified: Secondary | ICD-10-CM

## 2021-08-14 DIAGNOSIS — Z79899 Other long term (current) drug therapy: Secondary | ICD-10-CM

## 2021-08-14 DIAGNOSIS — G893 Neoplasm related pain (acute) (chronic): Secondary | ICD-10-CM | POA: Diagnosis present

## 2021-08-14 DIAGNOSIS — C799 Secondary malignant neoplasm of unspecified site: Secondary | ICD-10-CM | POA: Diagnosis present

## 2021-08-14 DIAGNOSIS — Z7901 Long term (current) use of anticoagulants: Secondary | ICD-10-CM

## 2021-08-14 DIAGNOSIS — T402X5A Adverse effect of other opioids, initial encounter: Secondary | ICD-10-CM | POA: Diagnosis present

## 2021-08-14 DIAGNOSIS — I10 Essential (primary) hypertension: Secondary | ICD-10-CM | POA: Diagnosis present

## 2021-08-14 DIAGNOSIS — R0600 Dyspnea, unspecified: Secondary | ICD-10-CM

## 2021-08-14 DIAGNOSIS — Z881 Allergy status to other antibiotic agents status: Secondary | ICD-10-CM

## 2021-08-14 DIAGNOSIS — R1312 Dysphagia, oropharyngeal phase: Secondary | ICD-10-CM

## 2021-08-14 DIAGNOSIS — Z86711 Personal history of pulmonary embolism: Secondary | ICD-10-CM

## 2021-08-14 DIAGNOSIS — R042 Hemoptysis: Secondary | ICD-10-CM | POA: Diagnosis present

## 2021-08-14 DIAGNOSIS — Z923 Personal history of irradiation: Secondary | ICD-10-CM

## 2021-08-14 DIAGNOSIS — Z6831 Body mass index (BMI) 31.0-31.9, adult: Secondary | ICD-10-CM

## 2021-08-14 DIAGNOSIS — C3490 Malignant neoplasm of unspecified part of unspecified bronchus or lung: Secondary | ICD-10-CM | POA: Diagnosis not present

## 2021-08-14 DIAGNOSIS — R4702 Dysphasia: Secondary | ICD-10-CM | POA: Diagnosis present

## 2021-08-14 DIAGNOSIS — K5903 Drug induced constipation: Secondary | ICD-10-CM | POA: Diagnosis present

## 2021-08-14 DIAGNOSIS — Z515 Encounter for palliative care: Secondary | ICD-10-CM

## 2021-08-14 DIAGNOSIS — K222 Esophageal obstruction: Secondary | ICD-10-CM | POA: Diagnosis present

## 2021-08-14 DIAGNOSIS — I2782 Chronic pulmonary embolism: Secondary | ICD-10-CM

## 2021-08-14 DIAGNOSIS — Y842 Radiological procedure and radiotherapy as the cause of abnormal reaction of the patient, or of later complication, without mention of misadventure at the time of the procedure: Secondary | ICD-10-CM | POA: Diagnosis present

## 2021-08-14 DIAGNOSIS — I2609 Other pulmonary embolism with acute cor pulmonale: Secondary | ICD-10-CM

## 2021-08-14 LAB — URINALYSIS, ROUTINE W REFLEX MICROSCOPIC
Bacteria, UA: NONE SEEN
Bilirubin Urine: NEGATIVE
Glucose, UA: NEGATIVE mg/dL
Hgb urine dipstick: NEGATIVE
Ketones, ur: 15 mg/dL — AB
Nitrite: NEGATIVE
Protein, ur: NEGATIVE mg/dL
Specific Gravity, Urine: <1.005 — ABNORMAL LOW (ref 1.005–1.030)
pH: 6 (ref 5.0–8.0)

## 2021-08-14 LAB — CBC WITH DIFFERENTIAL/PLATELET
Abs Immature Granulocytes: 0.01 10*3/uL (ref 0.00–0.07)
Basophils Absolute: 0 10*3/uL (ref 0.0–0.1)
Basophils Relative: 0 %
Eosinophils Absolute: 0 10*3/uL (ref 0.0–0.5)
Eosinophils Relative: 0 %
HCT: 49.5 % — ABNORMAL HIGH (ref 36.0–46.0)
Hemoglobin: 15.4 g/dL — ABNORMAL HIGH (ref 12.0–15.0)
Immature Granulocytes: 0 %
Lymphocytes Relative: 10 %
Lymphs Abs: 0.7 10*3/uL (ref 0.7–4.0)
MCH: 31 pg (ref 26.0–34.0)
MCHC: 31.1 g/dL (ref 30.0–36.0)
MCV: 99.8 fL (ref 80.0–100.0)
Monocytes Absolute: 0.4 10*3/uL (ref 0.1–1.0)
Monocytes Relative: 6 %
Neutro Abs: 5.6 10*3/uL (ref 1.7–7.7)
Neutrophils Relative %: 84 %
Platelets: 153 10*3/uL (ref 150–400)
RBC: 4.96 MIL/uL (ref 3.87–5.11)
RDW: 12.7 % (ref 11.5–15.5)
WBC: 6.8 10*3/uL (ref 4.0–10.5)
nRBC: 0 % (ref 0.0–0.2)

## 2021-08-14 LAB — COMPREHENSIVE METABOLIC PANEL
ALT: 16 U/L (ref 0–44)
AST: 26 U/L (ref 15–41)
Albumin: 3.9 g/dL (ref 3.5–5.0)
Alkaline Phosphatase: 98 U/L (ref 38–126)
Anion gap: 11 (ref 5–15)
BUN: 11 mg/dL (ref 8–23)
CO2: 27 mmol/L (ref 22–32)
Calcium: 9.7 mg/dL (ref 8.9–10.3)
Chloride: 98 mmol/L (ref 98–111)
Creatinine, Ser: 0.99 mg/dL (ref 0.44–1.00)
GFR, Estimated: >60 mL/min (ref >60)
Glucose, Bld: 101 mg/dL — ABNORMAL HIGH (ref 70–99)
Potassium: 4.2 mmol/L (ref 3.5–5.1)
Sodium: 136 mmol/L (ref 135–145)
Total Bilirubin: 1 mg/dL (ref 0.3–1.2)
Total Protein: 7.1 g/dL (ref 6.5–8.1)

## 2021-08-14 LAB — LIPASE, BLOOD: Lipase: 30 U/L (ref 11–51)

## 2021-08-14 LAB — TSH: TSH: 1.204 u[IU]/mL (ref 0.350–4.500)

## 2021-08-14 LAB — RESP PANEL BY RT-PCR (FLU A&B, COVID) ARPGX2
Influenza A by PCR: NEGATIVE
Influenza B by PCR: NEGATIVE
SARS Coronavirus 2 by RT PCR: NEGATIVE

## 2021-08-14 MED ORDER — BENZONATATE 100 MG PO CAPS
100.0000 mg | ORAL_CAPSULE | Freq: Three times a day (TID) | ORAL | Status: DC | PRN
  Administered 2021-08-14: 100 mg via ORAL
  Filled 2021-08-14: qty 1

## 2021-08-14 MED ORDER — SODIUM CHLORIDE 0.9 % IV SOLN: Freq: Once | INTRAVENOUS | Status: DC

## 2021-08-14 MED ORDER — HYDROMORPHONE HCL 1 MG/ML IJ SOLN
0.5000 mg | INTRAMUSCULAR | Status: DC | PRN
Start: 2021-08-14 — End: 2021-08-15
  Administered 2021-08-14 – 2021-08-15 (×4): 0.5 mg via INTRAVENOUS
  Filled 2021-08-14: qty 0.5
  Filled 2021-08-14: qty 1
  Filled 2021-08-14 (×2): qty 0.5

## 2021-08-14 MED ORDER — OSIMERTINIB MESYLATE 80 MG PO TABS: 80.0000 mg | ORAL_TABLET | Freq: Every day | ORAL | Status: DC

## 2021-08-14 MED ORDER — DEXTROSE-NACL 5-0.9 % IV SOLN: INTRAVENOUS | Status: DC

## 2021-08-14 MED ORDER — CYCLOBENZAPRINE HCL 10 MG PO TABS: 10.0000 mg | ORAL_TABLET | Freq: Two times a day (BID) | ORAL | Status: DC | PRN

## 2021-08-14 MED ORDER — SODIUM CHLORIDE 0.9 % IV BOLUS
1000.0000 mL | Freq: Once | INTRAVENOUS | Status: AC
  Administered 2021-08-14: 1000 mL via INTRAVENOUS

## 2021-08-14 MED ORDER — FENTANYL 50 MCG/HR TD PT72
1.0000 | MEDICATED_PATCH | TRANSDERMAL | Status: DC
Start: 2021-08-14 — End: 2021-08-23
  Administered 2021-08-14 – 2021-08-20 (×3): 1 via TRANSDERMAL
  Filled 2021-08-14 (×3): qty 1

## 2021-08-14 MED ORDER — ONDANSETRON HCL 4 MG/2ML IJ SOLN
4.0000 mg | Freq: Four times a day (QID) | INTRAMUSCULAR | Status: DC | PRN
  Administered 2021-08-15 – 2021-08-28 (×19): 4 mg via INTRAVENOUS
  Filled 2021-08-14 (×18): qty 2

## 2021-08-14 MED ORDER — ONDANSETRON HCL 4 MG PO TABS: 4.0000 mg | ORAL_TABLET | Freq: Four times a day (QID) | ORAL | Status: DC | PRN

## 2021-08-14 MED ORDER — OXYCODONE HCL 5 MG PO TABS: 5.0000 mg | ORAL_TABLET | Freq: Three times a day (TID) | ORAL | Status: DC | PRN

## 2021-08-14 MED ORDER — PROMETHAZINE-CODEINE 6.25-10 MG/5ML PO SYRP: 5.0000 mL | ORAL_SOLUTION | Freq: Four times a day (QID) | ORAL | Status: DC | PRN

## 2021-08-14 MED ORDER — IPRATROPIUM-ALBUTEROL 0.5-2.5 (3) MG/3ML IN SOLN
3.0000 mL | Freq: Four times a day (QID) | RESPIRATORY_TRACT | Status: DC | PRN
  Administered 2021-08-20 – 2021-08-31 (×3): 3 mL via RESPIRATORY_TRACT
  Filled 2021-08-14 (×4): qty 3

## 2021-08-14 MED ORDER — PROMETHAZINE-CODEINE 6.25-10 MG/5ML PO SYRP
5.0000 mL | ORAL_SOLUTION | Freq: Four times a day (QID) | ORAL | Status: DC | PRN
  Administered 2021-08-14 – 2021-08-15 (×2): 10 mL via ORAL
  Filled 2021-08-14 (×2): qty 10
  Filled 2021-08-14: qty 5
  Filled 2021-08-14 (×4): qty 10

## 2021-08-14 MED ORDER — PANTOPRAZOLE SODIUM 40 MG IV SOLR
40.0000 mg | INTRAVENOUS | Status: DC
  Administered 2021-08-14 – 2021-08-16 (×3): 40 mg via INTRAVENOUS
  Filled 2021-08-14 (×3): qty 40

## 2021-08-14 NOTE — Telephone Encounter (Signed)
Pts daughter called stating the pt can no longer swallow and will take the pt to the ER for evaluation. The family is concerned the pt is becoming dehydrated. Discussed this with Dr. Julien Nordmann who agrees with the plan for the pt to go to the ER.

## 2021-08-14 NOTE — ED Provider Notes (Signed)
[facility] DEPT Provider Note   CSN: [id number] Arrival date & time: 08/14/21  1128     History Chief Complaint  Patient presents with   Dysphagia    Brittany Mccarty is a 60 y.o. female.  60 year old female with prior medical history as detailed below presents for evaluation.  Patient complains of progressive difficulty swallowing.  She reports that she is unable to take anything by mouth except for ice chips and minimal liquids.  Anything larger or more substantial causes her to gag and then vomit.  She denies pain.  She reports that the symptoms of dysphagia have been progressively getting worse over the last 2 to 3 weeks.  She denies prior history of EGD or prior GI work-up.  Patient reports that over the last 2 weeks she is lost 10 pounds.  She is concerned that she is dehydrated.  She apparently contacted Dr. Lew Dawes office and was advised to come to the ED for evaluation.  She was admitted approximately 2 weeks ago. Dysphagia was part of her presenting constellation of symptoms. Speech and swallow evaluation performed during admission. Barium swallow performed at that time was without significant abnormality. MRI Brain also obtained.   The history is provided by the patient and medical records.  Illness Location:  Difficulty swallowing, dysphagia, weight loss Severity:  Mild Onset quality:  Gradual Duration:  2 weeks Timing:  Constant Progression:  Worsening Chronicity:  New Associated symptoms: no abdominal pain, no chest pain and no fever       Past Medical History:  Diagnosis Date   NSCL CA DX'D 04/07/20    Patient Active Problem List   Diagnosis Date Noted   Chest pain 07/30/2021   Pneumonitis 07/30/2021   Dysphagia 07/30/2021   Bilateral headaches 07/30/2021   Visual changes 07/30/2021   Hyponatremia 07/30/2021   Overweight 07/30/2021   Cough 07/24/2021   Hyperkalemia 10/13/2020   Brain metastases ([place]) 05/13/2020    Encounter for antineoplastic chemotherapy 05/03/2020   Goals of care, counseling/discussion 42/68/3419   Complication of chest tube 04/24/2020   Adenocarcinoma of lung, right ([place]) 04/24/2020   Pulmonary embolism ([place]) 04/24/2020   Pleuritic chest pain 04/24/2020   Metastatic cancer ([place]) 04/24/2020    Past Surgical History:  Procedure Laterality Date   IR REMOVAL OF PLURAL CATH W/CUFF  04/24/2020     OB History   No obstetric history on file.     History reviewed. No pertinent family history.  Social History   Tobacco Use   Smoking status: Never   Smokeless tobacco: Never  Substance Use Topics   Alcohol use: Not Currently   Drug use: Never    Home Medications Prior to Admission medications   Medication Sig Start Date End Date Taking? Authorizing Provider  apixaban (ELIQUIS) 5 MG TABS tablet Take 2 tablets (10 mg total) by mouth 2 (two) times daily for 7 days, THEN 1 tablet (5 mg total) 2 (two) times daily for 28 days. Patient taking differently: 10mg  orally daily currently 07/31/21 09/04/21 Yes Sheikh, Omair Latif, DO  benzonatate (TESSALON) 100 MG capsule Take 1 capsule (100 mg total) by mouth 3 (three) times daily as needed for cough. 07/28/21  Yes Heilingoetter, Cassandra L, PA-C  cyclobenzaprine (FLEXERIL) 10 MG tablet Take 1 tablet (10 mg total) by mouth 2 (two) times daily as needed for muscle spasms. 07/30/21  Yes McDonald, Mia A, PA-C  fentaNYL (DURAGESIC) 50 MCG/HR Place 1 patch onto the skin every 3 (three) days. 08/11/21  Yes Heilingoetter, Cassandra L, PA-C  omeprazole (PRILOSEC) 20 MG capsule Take 1 capsule (20 mg total) by mouth daily. 07/24/21  Yes Heilingoetter, Cassandra L, PA-C  ondansetron (ZOFRAN) 4 MG tablet Take 4 mg by mouth every 8 (eight) hours as needed for nausea or vomiting. 04/08/20  Yes [provider]  osimertinib mesylate (TAGRISSO) 80 MG tablet Take 1 tablet (80 mg total) by mouth daily. 06/08/21  Yes Curt Bears, MD  oxyCODONE (OXY  IR/ROXICODONE) 5 MG immediate release tablet Take 1 tablet (5 mg total) by mouth 3 (three) times daily between meals as needed for severe pain. 07/31/21  Yes Heilingoetter, Cassandra L, PA-C  promethazine-codeine (PHENERGAN WITH CODEINE) 6.25-10 MG/5ML syrup Take 5-10 mLs by mouth every 6 (six) hours as needed for cough. 08/11/21  Yes Collene Gobble, MD  lidocaine (XYLOCAINE) 2 % solution Use as directed 15 mLs in the mouth or throat as needed for mouth pain. Patient not taking: No sig reported 07/30/21   McDonald, Mia A, PA-C  predniSONE (DELTASONE) 20 MG tablet Take 4 tablets (80 mg) daily for 1 week, followed by 2 tablets (40 mg) daily for 1 week, 1 tablet (20 mg) for 1 week. Patient not taking: Reported on 08/14/2021 07/24/21   Heilingoetter, Cassandra L, PA-C    Allergies    Other and Cefdinir  Review of Systems   Review of Systems  Constitutional:  Negative for fever.  Cardiovascular:  Negative for chest pain.  Gastrointestinal:  Negative for abdominal pain.  All other systems reviewed and are negative.  Physical Exam Updated Vital Signs BP (!) 142/113   Pulse (!) 106   Temp 98.3 F (36.8 C) (Oral)   Resp (!) 28   SpO2 100%   Physical Exam Vitals and nursing note reviewed.  Constitutional:      General: She is not in acute distress.    Appearance: Normal appearance. She is well-developed.  HENT:     Head: Normocephalic and atraumatic.  Eyes:     Conjunctiva/sclera: Conjunctivae normal.     Pupils: Pupils are equal, round, and reactive to light.  Cardiovascular:     Rate and Rhythm: Normal rate and regular rhythm.     Heart sounds: Normal heart sounds.  Pulmonary:     Effort: Pulmonary effort is normal. No respiratory distress.     Breath sounds: Normal breath sounds.  Abdominal:     General: There is no distension.     Palpations: Abdomen is soft.     Tenderness: There is no abdominal tenderness.  Musculoskeletal:        General: No deformity. Normal range of motion.      Cervical back: Normal range of motion and neck supple.  Skin:    General: Skin is warm and dry.  Neurological:     General: No focal deficit present.     Mental Status: She is alert and oriented to person, place, and time.    ED Results / Procedures / Treatments   Labs (all labs ordered are listed, but only abnormal results are displayed) Labs Reviewed  CBC WITH DIFFERENTIAL/PLATELET - Abnormal; Notable for the following components:      Result Value   Hemoglobin 15.4 (*)    HCT 49.5 (*)    All other components within normal limits  COMPREHENSIVE METABOLIC PANEL  LIPASE, BLOOD    EKG EKG Interpretation  Date/Time:  Monday August 14 2021 13:33:12 EDT Ventricular Rate:  83 PR Interval:  152 QRS Duration: 89  QT Interval:  377 QTC Calculation: 443 R Axis:   -7 Text Interpretation: Sinus rhythm Low voltage, precordial leads Confirmed by Dene Gentry [phone]) on 08/14/2021 1:56:50 PM  Radiology DG Chest 2 View  Result Date: 08/14/2021 CLINICAL DATA:  Cough, dysphagia, shortness of breath EXAM: CHEST - 2 VIEW COMPARISON:  07/29/2021 FINDINGS: The heart size and mediastinal contours are within normal limits. Unchanged post treatment appearance of the right chest with volume loss of the right hemithorax and a small right pleural effusion. No new airspace opacity. The visualized skeletal structures are unremarkable. IMPRESSION: Unchanged post treatment appearance of the right chest with volume loss of the right hemithorax and a small right pleural effusion. No new airspace opacity. Electronically Signed   By: Eddie Candle M.D.   On: 08/14/2021 12:52    Procedures Procedures   Medications Ordered in ED Medications  sodium chloride 0.9 % bolus 1,000 mL (has no administration in time range)    ED Course  I have reviewed the triage vital signs and the nursing notes.  Pertinent labs & imaging results that were available during my care of the patient were reviewed by me and  considered in my medical decision making (see chart for details).    MDM Rules/Calculators/A&P                           MDM  MSE complete  Brittany Mccarty was evaluated in Emergency Department on 08/14/2021 for the symptoms described in the history of present illness. She was evaluated in the context of the global COVID-19 pandemic, which necessitated consideration that the patient might be at risk for infection with the SARS-CoV-2 virus that causes COVID-19. Institutional protocols and algorithms that pertain to the evaluation of patients at risk for COVID-19 are in a state of rapid change based on information released by regulatory bodies including the CDC and federal and state organizations. These policies and algorithms were followed during the patient's care in the ED.  Patient is presenting with complaint of progressive dysphagia.  She reports that inability to swallow her medications over the last 24 to 48 hours.  Patient is without signs of significant dehydration on exam and workup.  Case discussed with Dr. Burr Medico - oncology - who agrees with plan to admit and observe.  Case discussed with [facility] - Therisa Doyne -agree to consult with plan to likely perform EGD in inpatient setting.  Hospitalist service aware of case and need to evaluate for admission.    Final Clinical Impression(s) / ED Diagnoses Final diagnoses:  Dysphagia, unspecified type    Rx / DC Orders ED Discharge Orders     None        Valarie Merino, MD 08/14/21 1534

## 2021-08-14 NOTE — H&P (View-Only) (Signed)
Referring Provider: Dr. Dene Gentry Primary Care Physician:  Pcp, No Primary Gastroenterologist:  None  Reason for Consultation:  dysphagia HPI: Brittany Mccarty is a 60 y.o. female with history of Stage IV  non-small cell lung cancer, adenocarcinoma diagnosed May 2021 presenting with large infrahilar mass in addition to mediastinal lymphadenopathy as well as metastatic disease to the liver, bone and brain. Following with Dr. Julien Nordmann.   Patient has 2 to 3 weeks of dysphagia, even with liquids, will spit up foam..  Only having difficult time with her pills.  Also she will get stuck. Patient states she can have choking with liquids, has hoarseness worse at night. Patient denies reflux, has had frequent hiccups and burping. Denies nausea, vomiting, AB pain, diarrhea.  Has had about 10 pound weight loss in last 3 weeks. Patient is on Eliquis for pulmonary embolism.  Last dose was yesterday evening. Patient has never seen GI before, never had endoscopy or colonoscopy. She has baseline shortness of breath and cough, no chest pain.   Patient did have barium swallow with pill 07/31/2021 with no strictures or masses seen.  Soft gel dysmotility.  Pill passed readily into the stomach. She had CT chest with contrast 07/19/2021 showed 2.4 x 2.4 cm calcified right paratracheal node stable since prior study.  No mediastinal lymphadenopathy.  Question of groundglass opacity in right upper lobe stomach was unremarkable no gastric wall thickening thickening    Past Medical History:  Diagnosis Date   NSCL CA DX'D 04/07/20    Past Surgical History:  Procedure Laterality Date   IR REMOVAL OF PLURAL CATH W/CUFF  04/24/2020    Prior to Admission medications   Medication Sig Start Date End Date Taking? Authorizing Provider  apixaban (ELIQUIS) 5 MG TABS tablet Take 2 tablets (10 mg total) by mouth 2 (two) times daily for 7 days, THEN 1 tablet (5 mg total) 2 (two) times daily for 28 days. Patient taking  differently: 10mg  orally daily currently 07/31/21 09/04/21 Yes Sheikh, Omair Latif, DO  benzonatate (TESSALON) 100 MG capsule Take 1 capsule (100 mg total) by mouth 3 (three) times daily as needed for cough. 07/28/21  Yes Heilingoetter, Cassandra L, PA-C  cyclobenzaprine (FLEXERIL) 10 MG tablet Take 1 tablet (10 mg total) by mouth 2 (two) times daily as needed for muscle spasms. 07/30/21  Yes McDonald, Mia A, PA-C  fentaNYL (DURAGESIC) 50 MCG/HR Place 1 patch onto the skin every 3 (three) days. 08/11/21  Yes Heilingoetter, Cassandra L, PA-C  omeprazole (PRILOSEC) 20 MG capsule Take 1 capsule (20 mg total) by mouth daily. 07/24/21  Yes Heilingoetter, Cassandra L, PA-C  ondansetron (ZOFRAN) 4 MG tablet Take 4 mg by mouth every 8 (eight) hours as needed for nausea or vomiting. 04/08/20  Yes [provider]  osimertinib mesylate (TAGRISSO) 80 MG tablet Take 1 tablet (80 mg total) by mouth daily. 06/08/21  Yes Curt Bears, MD  oxyCODONE (OXY IR/ROXICODONE) 5 MG immediate release tablet Take 1 tablet (5 mg total) by mouth 3 (three) times daily between meals as needed for severe pain. 07/31/21  Yes Heilingoetter, Cassandra L, PA-C  promethazine-codeine (PHENERGAN WITH CODEINE) 6.25-10 MG/5ML syrup Take 5-10 mLs by mouth every 6 (six) hours as needed for cough. 08/11/21  Yes Collene Gobble, MD  lidocaine (XYLOCAINE) 2 % solution Use as directed 15 mLs in the mouth or throat as needed for mouth pain. Patient not taking: No sig reported 07/30/21   McDonald, Mia A, PA-C  predniSONE (DELTASONE) 20 MG tablet  Take 4 tablets (80 mg) daily for 1 week, followed by 2 tablets (40 mg) daily for 1 week, 1 tablet (20 mg) for 1 week. Patient not taking: Reported on 08/14/2021 07/24/21   Heilingoetter, Cassandra L, PA-C    Scheduled Meds: Continuous Infusions: PRN Meds:.ipratropium-albuterol  Allergies as of 08/14/2021 - Review Complete 08/14/2021  Allergen Reaction Noted   Other  04/23/2020   Cefdinir Rash 04/11/2020     History reviewed. No pertinent family history.  Social History   Socioeconomic History   Marital status: Married    Spouse name: Not on file   Number of children: Not on file   Years of education: Not on file   Highest education level: Not on file  Occupational History   Not on file  Tobacco Use   Smoking status: Never   Smokeless tobacco: Never  Substance and Sexual Activity   Alcohol use: Not Currently   Drug use: Never   Sexual activity: Not on file  Other Topics Concern   Not on file  Social History Narrative   Not on file   Social Determinants of Health   Financial Resource Strain: Not on file  Food Insecurity: Not on file  Transportation Needs: Not on file  Physical Activity: Not on file  Stress: Not on file  Social Connections: Not on file  Intimate Partner Violence: Not on file    Review of Systems:  Review of Systems  Constitutional:  Positive for malaise/fatigue and weight loss. Negative for chills and fever.  HENT:         Hoarseness  Respiratory:  Positive for cough and shortness of breath.   Cardiovascular:  Negative for chest pain and leg swelling.  Gastrointestinal:  Negative for abdominal pain, blood in stool, constipation, diarrhea, heartburn, melena, nausea and vomiting.       Dysphagia  Musculoskeletal:  Negative for falls.  Skin:  Negative for rash.  Neurological:  Positive for weakness.  Psychiatric/Behavioral:  The patient is nervous/anxious.     Physical Exam: Vital signs: Vitals:   08/14/21 1334 08/14/21 1442  BP: (!) 142/113 (!) 146/101  Pulse: (!) 106 83  Resp: (!) 28 (!) 33  Temp:    SpO2: 100% 100%     Physical Exam Constitutional:      Appearance: Normal appearance. She is not ill-appearing.  HENT:     Head: Normocephalic.  Eyes:     General: No scleral icterus. Cardiovascular:     Rate and Rhythm: Normal rate and regular rhythm.     Heart sounds: No murmur heard. Pulmonary:     Effort: Pulmonary effort is  normal.     Breath sounds: Normal breath sounds.  Abdominal:     General: Abdomen is flat. Bowel sounds are normal.     Palpations: Abdomen is soft.     Tenderness: There is no abdominal tenderness.  Musculoskeletal:        General: Normal range of motion.  Skin:    Coloration: Skin is not jaundiced.  Neurological:     General: No focal deficit present.     Mental Status: She is alert.  Psychiatric:     Comments: Patient anxious     GI:  Lab Results: Recent Labs    08/14/21 1258  WBC 6.8  HGB 15.4*  HCT 49.5*  PLT 153   BMET Recent Labs    08/14/21 1407  NA 136  K 4.2  CL 98  CO2 27  GLUCOSE 101*  BUN  11  CREATININE 0.99  CALCIUM 9.7   LFT Recent Labs    08/14/21 1407  PROT 7.1  ALBUMIN 3.9  AST 26  ALT 16  ALKPHOS 98  BILITOT 1.0   PT/INR No results for input(s): LABPROT, INR in the last 72 hours.  Studies/Results: DG Chest 2 View  Result Date: 08/14/2021 CLINICAL DATA:  Cough, dysphagia, shortness of breath EXAM: CHEST - 2 VIEW COMPARISON:  07/29/2021 FINDINGS: The heart size and mediastinal contours are within normal limits. Unchanged post treatment appearance of the right chest with volume loss of the right hemithorax and a small right pleural effusion. No new airspace opacity. The visualized skeletal structures are unremarkable. IMPRESSION: Unchanged post treatment appearance of the right chest with volume loss of the right hemithorax and a small right pleural effusion. No new airspace opacity. Electronically Signed   By: Eddie Candle M.D.   On: 08/14/2021 12:52    Impression and Plan Dysphagia in patient with metastatic adenocarcinoma non-small cell lung cancer.  Had normal barium swallow 07/31/2021. With 10 pound weight loss, dysphagia, will schedule for endoscopy in the morning. Patient n.p.o. at midnight Last Eliquis yesterday.  Held today and tomorrow for procedure If endoscopy is inconclusive we will consider modified barium swallow as  patient symptoms are more pharyngeal in nature.    LOS: 0 days   Vladimir Crofts  PA-C 08/14/2021, 4:16 PM  Contact #  [phone]

## 2021-08-14 NOTE — ED Provider Notes (Signed)
Emergency Medicine Provider Triage Evaluation Note  Brittany Mccarty , a 60 y.o. female  was evaluated in triage.  Pt complains of difficulty swallowing.  Has been present for 2-1/2 weeks, worse in the past 48 hours.  Patient states to the point where she is unable to eat or drink anything and unable to take her pills.  She states that as soon as she swallows something, it feels like he gets stuck in her throat and immediately comes back up.  She is concerned she is getting dehydrated.  She is also had some increased shortness of breath of the past 8 weeks.  She was evaluated for this 2 weeks ago without answer.  She is never seen GI before, never had an EGD.  No previous history of stricture.  Review of Systems  Positive: Dysphagia, shortness of breath Negative: Fever  Physical Exam  BP (!) 146/82 (BP Location: Left Arm)   Pulse (!) 116   Temp 98.3 F (36.8 C) (Oral)   Resp 17   SpO2 99%  Gen:   Awake, no distress   Resp:  Normal effort  MSK:   Moves extremities without difficulty  Other:  Speaking in short sentences, mild tachypnea.  Tachycardic.  Handling secretions.  Medical Decision Making  Medically screening exam initiated at 12:17 PM.  Appropriate orders placed.  Brittany Mccarty was informed that the remainder of the evaluation will be completed by another provider, this initial triage assessment does not replace that evaluation, and the importance of remaining in the ED until their evaluation is complete.  Labs and cxr. MRI brain 2 wks ago negative.    Franchot Heidelberg, PA-C 08/14/21 1219    Hayden Rasmussen, MD 08/14/21 2130

## 2021-08-14 NOTE — H&P (Signed)
History and Physical    Brittany Mccarty GPQ:[id number] DOB: [birth year] DOA: 08/14/2021  PCP: Pcp, No Patient coming from: Home.  Chief Complaint: Difficulty swallowing  HPI: Brittany Mccarty is a 60 y.o. female with history of metastatic lung cancer on chemotherapy, PE on Eliquis, dysphagia and recent hospitalization from 8/27-8/29 for acute PE and dysphagia returning with progressive dysphagia.  Patient reports progressive dysphagia to the extent of difficulty swallowing her medications.  She describes the dysphagia as food getting stuck in her throat.  She also reports pain with swallowing.  This been going on for over 2 weeks and progressing.  Has dysphagia with both solid and liquid.  Did not feel like food is getting stuck in her chest although she did not eat much today.  She also reports hoarse voice more at night.  She reports wheezing, shortness of breath and cough with clear phlegm.  She denies heartburn or reflux.  She denies postnasal drip.  She denies history of asthma or COPD.  She says she never smoked cigarettes.  She denies chest pain, nausea, vomiting, abdominal pain, UTI symptoms or focal neuro symptoms.  Last dose of Eliquis and Tagrisso was yesterday.   During her recent hospitalization, patient had unremarkable MBS.  She also had an MRI brain without acute finding to explain patient's symptoms.  His CTA chest without significant finding other than suspected radiation changes in the right hemithorax and 2.3 cm calcified right paratracheal node.   In ED, slightly tachycardic to 116 but improved to 83.  Slightly hypertensive.  Saturating at 100% on RA.  Labs including CMP, lipase and CBC without significant finding.  CXR without significant finding other than possible small pleural effusion.  UA with 15 ketones.  COVID-19 PCR pending.  Received normal saline bolus 1 L.  [facility] consulted and hospitalist service called for admission.  ROS All review of system negative except  for pertinent positives and negatives as history of present illness above.  PMH Past Medical History:  Diagnosis Date   NSCL CA DX'D 04/07/20   PSH Past Surgical History:  Procedure Laterality Date   IR REMOVAL OF PLURAL CATH W/CUFF  04/24/2020   Fam HX No family history of esophageal cancer.  Social Hx  reports that she has never smoked. She has never used smokeless tobacco. She reports that she does not currently use alcohol. She reports that she does not use drugs.  Allergy Allergies  Allergen Reactions   Other     no blood transfusions    Cefdinir Rash   Home Meds Prior to Admission medications   Medication Sig Start Date End Date Taking? Authorizing Provider  apixaban (ELIQUIS) 5 MG TABS tablet Take 2 tablets (10 mg total) by mouth 2 (two) times daily for 7 days, THEN 1 tablet (5 mg total) 2 (two) times daily for 28 days. Patient taking differently: 10mg  orally daily currently 07/31/21 09/04/21 Yes Sheikh, Omair Latif, DO  benzonatate (TESSALON) 100 MG capsule Take 1 capsule (100 mg total) by mouth 3 (three) times daily as needed for cough. 07/28/21  Yes Heilingoetter, Cassandra L, PA-C  cyclobenzaprine (FLEXERIL) 10 MG tablet Take 1 tablet (10 mg total) by mouth 2 (two) times daily as needed for muscle spasms. 07/30/21  Yes McDonald, Mia A, PA-C  fentaNYL (DURAGESIC) 50 MCG/HR Place 1 patch onto the skin every 3 (three) days. 08/11/21  Yes Heilingoetter, Cassandra L, PA-C  omeprazole (PRILOSEC) 20 MG capsule Take 1 capsule (20 mg total) by mouth daily. 07/24/21  Yes Heilingoetter, Cassandra L, PA-C  ondansetron (ZOFRAN) 4 MG tablet Take 4 mg by mouth every 8 (eight) hours as needed for nausea or vomiting. 04/08/20  Yes [provider]  osimertinib mesylate (TAGRISSO) 80 MG tablet Take 1 tablet (80 mg total) by mouth daily. 06/08/21  Yes Curt Bears, MD  oxyCODONE (OXY IR/ROXICODONE) 5 MG immediate release tablet Take 1 tablet (5 mg total) by mouth 3 (three) times daily  between meals as needed for severe pain. 07/31/21  Yes Heilingoetter, Cassandra L, PA-C  promethazine-codeine (PHENERGAN WITH CODEINE) 6.25-10 MG/5ML syrup Take 5-10 mLs by mouth every 6 (six) hours as needed for cough. 08/11/21  Yes Collene Gobble, MD  lidocaine (XYLOCAINE) 2 % solution Use as directed 15 mLs in the mouth or throat as needed for mouth pain. Patient not taking: No sig reported 07/30/21   McDonald, Mia A, PA-C  predniSONE (DELTASONE) 20 MG tablet Take 4 tablets (80 mg) daily for 1 week, followed by 2 tablets (40 mg) daily for 1 week, 1 tablet (20 mg) for 1 week. Patient not taking: Reported on 08/14/2021 07/24/21   Heilingoetter, Tobe Sos, PA-C    Physical Exam: Vitals:   08/14/21 1147 08/14/21 1334 08/14/21 1442  BP: (!) 146/82 (!) 142/113 (!) 146/101  Pulse: (!) 116 (!) 106 83  Resp: 17 (!) 28 (!) 33  Temp: 98.3 F (36.8 C)    TempSrc: Oral    SpO2: 99% 100% 100%    GENERAL: No acute distress.  Appears well.  HEENT: MMM.  Vision and hearing grossly intact.  NECK: Supple.  No apparent JVD.  RESP: 99% on RA.  No IWOB.  Seems to have slight upper airway sounds transmitted CVS:  RRR. Heart sounds normal.  ABD/GI/GU: Bowel sounds present. Soft. Non tender.  MSK/EXT:  Moves extremities. No apparent deformity or edema.  SKIN: no apparent skin lesion or wound NEURO: Awake, alert and oriented appropriately.  No gross deficit.  PSYCH: Calm. Normal affect.   Personally Reviewed Radiological Exams DG Chest 2 View  Result Date: 08/14/2021 CLINICAL DATA:  Cough, dysphagia, shortness of breath EXAM: CHEST - 2 VIEW COMPARISON:  07/29/2021 FINDINGS: The heart size and mediastinal contours are within normal limits. Unchanged post treatment appearance of the right chest with volume loss of the right hemithorax and a small right pleural effusion. No new airspace opacity. The visualized skeletal structures are unremarkable. IMPRESSION: Unchanged post treatment appearance of the right  chest with volume loss of the right hemithorax and a small right pleural effusion. No new airspace opacity. Electronically Signed   By: Eddie Candle M.D.   On: 08/14/2021 12:52     Personally Reviewed Labs: CBC: Recent Labs  Lab 08/14/21 1258  WBC 6.8  NEUTROABS 5.6  HGB 15.4*  HCT 49.5*  MCV 99.8  PLT 742   Basic Metabolic Panel: Recent Labs  Lab 08/14/21 1407  NA 136  K 4.2  CL 98  CO2 27  GLUCOSE 101*  BUN 11  CREATININE 0.99  CALCIUM 9.7   GFR: Estimated Creatinine Clearance: 60.5 mL/min (by C-G formula based on SCr of 0.99 mg/dL). Liver Function Tests: Recent Labs  Lab 08/14/21 1407  AST 26  ALT 16  ALKPHOS 98  BILITOT 1.0  PROT 7.1  ALBUMIN 3.9   Recent Labs  Lab 08/14/21 1408  LIPASE 30   No results for input(s): AMMONIA in the last 168 hours. Coagulation Profile: No results for input(s): INR, PROTIME in the last 168  hours. Cardiac Enzymes: No results for input(s): CKTOTAL, CKMB, CKMBINDEX, TROPONINI in the last 168 hours. BNP (last 3 results) No results for input(s): PROBNP in the last 8760 hours. HbA1C: No results for input(s): HGBA1C in the last 72 hours. CBG: No results for input(s): GLUCAP in the last 168 hours. Lipid Profile: No results for input(s): CHOL, HDL, LDLCALC, TRIG, CHOLHDL, LDLDIRECT in the last 72 hours. Thyroid Function Tests: No results for input(s): TSH, T4TOTAL, FREET4, T3FREE, THYROIDAB in the last 72 hours. Anemia Panel: No results for input(s): VITAMINB12, FOLATE, FERRITIN, TIBC, IRON, RETICCTPCT in the last 72 hours. 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  She had MBS last hospitalization that was unremarkable.  MRI brain and CTA chest without significant finding to explain his symptoms.  No improvement in his symptoms with oral PPI and steroid.  -Dysphagia 3 diet -N.p.o. after midnight -Plan for EGD in the morning -If EGD is unremarkable, may consider ENT consult. -Continue PPI  Stage IV lung cancer with brain mets-followed by Dr. Julien Nordmann. Per oncology note from 07/24/2021, some concern about drug-induced pneumonitis and Tagrisso was held.  She was started on a steroid taper that she completed recently.  Currently back on on Tagrisso. -Continue home [facility] -Oncology consulted by EDP.  Wheeze/cough/shortness of breath- Since she was evaluated by pulmonology on 9/9 as well. -Trial of DuoNeb -Continue mucolytic's and antitussive  History of recurrent PE-reports good compliance with Eliquis.  Last Eliquis dose was yesterday evening. -Resume anticoagulation after EGD in the morning  Elevated blood pressure-no history of hypertension.  Does not seem to be medication. -As needed hydralazine  DVT prophylaxis: To resume Eliquis after EGD in the morning  Code Status: Full code Family Communication: Updated patient's son at bedside and patient's daughter over the phone  Disposition Plan: Admit to MedSurg Consults called: GI Admission status: Observation Level of care: Med-Surg   Mercy Riding MD [facility]  If 7PM-7AM, please contact night-coverage [email or web address]  08/14/2021, 4:59 PM

## 2021-08-14 NOTE — ED Triage Notes (Signed)
Patient reports dysphagia x2 weeks. She also reports SOB x8 weeks. She reports she is unable to swallow her medications because the pills are stuck in her throat. She reports only being able to eat ice chips. Hx lung cancer, pt currently receiving targeted therapy.

## 2021-08-14 NOTE — Consult Note (Signed)
Referring Provider: Dr. Dene Gentry Primary Care Physician:  Pcp, No Primary Gastroenterologist:  None  Reason for Consultation:  dysphagia HPI: Brittany Mccarty is a 60 y.o. female with history of Stage IV  non-small cell lung cancer, adenocarcinoma diagnosed May 2021 presenting with large infrahilar mass in addition to mediastinal lymphadenopathy as well as metastatic disease to the liver, bone and brain. Following with Dr. Julien Nordmann.   Patient has 2 to 3 weeks of dysphagia, even with liquids, will spit up foam..  Only having difficult time with her pills.  Also she will get stuck. Patient states she can have choking with liquids, has hoarseness worse at night. Patient denies reflux, has had frequent hiccups and burping. Denies nausea, vomiting, AB pain, diarrhea.  Has had about 10 pound weight loss in last 3 weeks. Patient is on Eliquis for pulmonary embolism.  Last dose was yesterday evening. Patient has never seen GI before, never had endoscopy or colonoscopy. She has baseline shortness of breath and cough, no chest pain.   Patient did have barium swallow with pill 07/31/2021 with no strictures or masses seen.  Soft gel dysmotility.  Pill passed readily into the stomach. She had CT chest with contrast 07/19/2021 showed 2.4 x 2.4 cm calcified right paratracheal node stable since prior study.  No mediastinal lymphadenopathy.  Question of groundglass opacity in right upper lobe stomach was unremarkable no gastric wall thickening thickening    Past Medical History:  Diagnosis Date   NSCL CA DX'D 04/07/20    Past Surgical History:  Procedure Laterality Date   IR REMOVAL OF PLURAL CATH W/CUFF  04/24/2020    Prior to Admission medications   Medication Sig Start Date End Date Taking? Authorizing Provider  apixaban (ELIQUIS) 5 MG TABS tablet Take 2 tablets (10 mg total) by mouth 2 (two) times daily for 7 days, THEN 1 tablet (5 mg total) 2 (two) times daily for 28 days. Patient taking  differently: 10mg  orally daily currently 07/31/21 09/04/21 Yes Sheikh, Omair Latif, DO  benzonatate (TESSALON) 100 MG capsule Take 1 capsule (100 mg total) by mouth 3 (three) times daily as needed for cough. 07/28/21  Yes Heilingoetter, Cassandra L, PA-C  cyclobenzaprine (FLEXERIL) 10 MG tablet Take 1 tablet (10 mg total) by mouth 2 (two) times daily as needed for muscle spasms. 07/30/21  Yes McDonald, Mia A, PA-C  fentaNYL (DURAGESIC) 50 MCG/HR Place 1 patch onto the skin every 3 (three) days. 08/11/21  Yes Heilingoetter, Cassandra L, PA-C  omeprazole (PRILOSEC) 20 MG capsule Take 1 capsule (20 mg total) by mouth daily. 07/24/21  Yes Heilingoetter, Cassandra L, PA-C  ondansetron (ZOFRAN) 4 MG tablet Take 4 mg by mouth every 8 (eight) hours as needed for nausea or vomiting. 04/08/20  Yes [provider]  osimertinib mesylate (TAGRISSO) 80 MG tablet Take 1 tablet (80 mg total) by mouth daily. 06/08/21  Yes Curt Bears, MD  oxyCODONE (OXY IR/ROXICODONE) 5 MG immediate release tablet Take 1 tablet (5 mg total) by mouth 3 (three) times daily between meals as needed for severe pain. 07/31/21  Yes Heilingoetter, Cassandra L, PA-C  promethazine-codeine (PHENERGAN WITH CODEINE) 6.25-10 MG/5ML syrup Take 5-10 mLs by mouth every 6 (six) hours as needed for cough. 08/11/21  Yes Collene Gobble, MD  lidocaine (XYLOCAINE) 2 % solution Use as directed 15 mLs in the mouth or throat as needed for mouth pain. Patient not taking: No sig reported 07/30/21   McDonald, Mia A, PA-C  predniSONE (DELTASONE) 20 MG tablet  Take 4 tablets (80 mg) daily for 1 week, followed by 2 tablets (40 mg) daily for 1 week, 1 tablet (20 mg) for 1 week. Patient not taking: Reported on 08/14/2021 07/24/21   Heilingoetter, Cassandra L, PA-C    Scheduled Meds: Continuous Infusions: PRN Meds:.ipratropium-albuterol  Allergies as of 08/14/2021 - Review Complete 08/14/2021  Allergen Reaction Noted   Other  04/23/2020   Cefdinir Rash 04/11/2020     History reviewed. No pertinent family history.  Social History   Socioeconomic History   Marital status: Married    Spouse name: Not on file   Number of children: Not on file   Years of education: Not on file   Highest education level: Not on file  Occupational History   Not on file  Tobacco Use   Smoking status: Never   Smokeless tobacco: Never  Substance and Sexual Activity   Alcohol use: Not Currently   Drug use: Never   Sexual activity: Not on file  Other Topics Concern   Not on file  Social History Narrative   Not on file   Social Determinants of Health   Financial Resource Strain: Not on file  Food Insecurity: Not on file  Transportation Needs: Not on file  Physical Activity: Not on file  Stress: Not on file  Social Connections: Not on file  Intimate Partner Violence: Not on file    Review of Systems:  Review of Systems  Constitutional:  Positive for malaise/fatigue and weight loss. Negative for chills and fever.  HENT:         Hoarseness  Respiratory:  Positive for cough and shortness of breath.   Cardiovascular:  Negative for chest pain and leg swelling.  Gastrointestinal:  Negative for abdominal pain, blood in stool, constipation, diarrhea, heartburn, melena, nausea and vomiting.       Dysphagia  Musculoskeletal:  Negative for falls.  Skin:  Negative for rash.  Neurological:  Positive for weakness.  Psychiatric/Behavioral:  The patient is nervous/anxious.     Physical Exam: Vital signs: Vitals:   08/14/21 1334 08/14/21 1442  BP: (!) 142/113 (!) 146/101  Pulse: (!) 106 83  Resp: (!) 28 (!) 33  Temp:    SpO2: 100% 100%     Physical Exam Constitutional:      Appearance: Normal appearance. She is not ill-appearing.  HENT:     Head: Normocephalic.  Eyes:     General: No scleral icterus. Cardiovascular:     Rate and Rhythm: Normal rate and regular rhythm.     Heart sounds: No murmur heard. Pulmonary:     Effort: Pulmonary effort is  normal.     Breath sounds: Normal breath sounds.  Abdominal:     General: Abdomen is flat. Bowel sounds are normal.     Palpations: Abdomen is soft.     Tenderness: There is no abdominal tenderness.  Musculoskeletal:        General: Normal range of motion.  Skin:    Coloration: Skin is not jaundiced.  Neurological:     General: No focal deficit present.     Mental Status: She is alert.  Psychiatric:     Comments: Patient anxious     GI:  Lab Results: Recent Labs    08/14/21 1258  WBC 6.8  HGB 15.4*  HCT 49.5*  PLT 153   BMET Recent Labs    08/14/21 1407  NA 136  K 4.2  CL 98  CO2 27  GLUCOSE 101*  BUN  11  CREATININE 0.99  CALCIUM 9.7   LFT Recent Labs    08/14/21 1407  PROT 7.1  ALBUMIN 3.9  AST 26  ALT 16  ALKPHOS 98  BILITOT 1.0   PT/INR No results for input(s): LABPROT, INR in the last 72 hours.  Studies/Results: DG Chest 2 View  Result Date: 08/14/2021 CLINICAL DATA:  Cough, dysphagia, shortness of breath EXAM: CHEST - 2 VIEW COMPARISON:  07/29/2021 FINDINGS: The heart size and mediastinal contours are within normal limits. Unchanged post treatment appearance of the right chest with volume loss of the right hemithorax and a small right pleural effusion. No new airspace opacity. The visualized skeletal structures are unremarkable. IMPRESSION: Unchanged post treatment appearance of the right chest with volume loss of the right hemithorax and a small right pleural effusion. No new airspace opacity. Electronically Signed   By: Eddie Candle M.D.   On: 08/14/2021 12:52    Impression and Plan Dysphagia in patient with metastatic adenocarcinoma non-small cell lung cancer.  Had normal barium swallow 07/31/2021. With 10 pound weight loss, dysphagia, will schedule for endoscopy in the morning. Patient n.p.o. at midnight Last Eliquis yesterday.  Held today and tomorrow for procedure If endoscopy is inconclusive we will consider modified barium swallow as  patient symptoms are more pharyngeal in nature.    LOS: 0 days   Vladimir Crofts  PA-C 08/14/2021, 4:16 PM  Contact #  [phone]

## 2021-08-15 ENCOUNTER — Inpatient Hospital Stay (HOSPITAL_COMMUNITY): Payer: 59 | Admitting: Anesthesiology

## 2021-08-15 ENCOUNTER — Encounter (HOSPITAL_COMMUNITY)
Admission: EM | Disposition: A | Discharge status: Home Health | Payer: Self-pay | Source: Home / Self Care | Attending: Internal Medicine

## 2021-08-15 ENCOUNTER — Encounter (HOSPITAL_COMMUNITY): Payer: Self-pay | Admitting: Internal Medicine

## 2021-08-15 DIAGNOSIS — R1310 Dysphagia, unspecified: Secondary | ICD-10-CM | POA: Diagnosis present

## 2021-08-15 DIAGNOSIS — J9601 Acute respiratory failure with hypoxia: Secondary | ICD-10-CM | POA: Diagnosis not present

## 2021-08-15 DIAGNOSIS — C3491 Malignant neoplasm of unspecified part of right bronchus or lung: Secondary | ICD-10-CM | POA: Diagnosis present

## 2021-08-15 DIAGNOSIS — C799 Secondary malignant neoplasm of unspecified site: Secondary | ICD-10-CM | POA: Diagnosis not present

## 2021-08-15 DIAGNOSIS — I2699 Other pulmonary embolism without acute cor pulmonale: Secondary | ICD-10-CM | POA: Diagnosis not present

## 2021-08-15 DIAGNOSIS — Z86711 Personal history of pulmonary embolism: Secondary | ICD-10-CM | POA: Diagnosis not present

## 2021-08-15 DIAGNOSIS — R1319 Other dysphagia: Secondary | ICD-10-CM | POA: Diagnosis not present

## 2021-08-15 DIAGNOSIS — R042 Hemoptysis: Secondary | ICD-10-CM | POA: Diagnosis present

## 2021-08-15 DIAGNOSIS — Z515 Encounter for palliative care: Secondary | ICD-10-CM | POA: Diagnosis not present

## 2021-08-15 DIAGNOSIS — C3490 Malignant neoplasm of unspecified part of unspecified bronchus or lung: Secondary | ICD-10-CM | POA: Diagnosis not present

## 2021-08-15 DIAGNOSIS — Z7901 Long term (current) use of anticoagulants: Secondary | ICD-10-CM | POA: Diagnosis not present

## 2021-08-15 DIAGNOSIS — G893 Neoplasm related pain (acute) (chronic): Secondary | ICD-10-CM | POA: Diagnosis present

## 2021-08-15 DIAGNOSIS — C7951 Secondary malignant neoplasm of bone: Secondary | ICD-10-CM | POA: Diagnosis not present

## 2021-08-15 DIAGNOSIS — Z20822 Contact with and (suspected) exposure to covid-19: Secondary | ICD-10-CM | POA: Diagnosis not present

## 2021-08-15 DIAGNOSIS — E1310 Other specified diabetes mellitus with ketoacidosis without coma: Secondary | ICD-10-CM | POA: Diagnosis not present

## 2021-08-15 DIAGNOSIS — I471 Supraventricular tachycardia: Secondary | ICD-10-CM | POA: Diagnosis not present

## 2021-08-15 DIAGNOSIS — T402X5A Adverse effect of other opioids, initial encounter: Secondary | ICD-10-CM | POA: Diagnosis present

## 2021-08-15 DIAGNOSIS — Z923 Personal history of irradiation: Secondary | ICD-10-CM | POA: Diagnosis not present

## 2021-08-15 DIAGNOSIS — C7931 Secondary malignant neoplasm of brain: Secondary | ICD-10-CM | POA: Diagnosis not present

## 2021-08-15 DIAGNOSIS — E43 Unspecified severe protein-calorie malnutrition: Secondary | ICD-10-CM | POA: Diagnosis not present

## 2021-08-15 DIAGNOSIS — Z7189 Other specified counseling: Secondary | ICD-10-CM | POA: Diagnosis not present

## 2021-08-15 DIAGNOSIS — Y842 Radiological procedure and radiotherapy as the cause of abnormal reaction of the patient, or of later complication, without mention of misadventure at the time of the procedure: Secondary | ICD-10-CM | POA: Diagnosis present

## 2021-08-15 DIAGNOSIS — K5903 Drug induced constipation: Secondary | ICD-10-CM | POA: Diagnosis present

## 2021-08-15 DIAGNOSIS — Z888 Allergy status to other drugs, medicaments and biological substances status: Secondary | ICD-10-CM | POA: Diagnosis not present

## 2021-08-15 DIAGNOSIS — Z79899 Other long term (current) drug therapy: Secondary | ICD-10-CM | POA: Diagnosis not present

## 2021-08-15 DIAGNOSIS — K222 Esophageal obstruction: Secondary | ICD-10-CM | POA: Diagnosis not present

## 2021-08-15 DIAGNOSIS — Z881 Allergy status to other antibiotic agents status: Secondary | ICD-10-CM | POA: Diagnosis not present

## 2021-08-15 DIAGNOSIS — I10 Essential (primary) hypertension: Secondary | ICD-10-CM | POA: Diagnosis not present

## 2021-08-15 DIAGNOSIS — R4702 Dysphasia: Secondary | ICD-10-CM | POA: Diagnosis present

## 2021-08-15 DIAGNOSIS — Z6831 Body mass index (BMI) 31.0-31.9, adult: Secondary | ICD-10-CM | POA: Diagnosis not present

## 2021-08-15 DIAGNOSIS — C787 Secondary malignant neoplasm of liver and intrahepatic bile duct: Secondary | ICD-10-CM | POA: Diagnosis not present

## 2021-08-15 DIAGNOSIS — F419 Anxiety disorder, unspecified: Secondary | ICD-10-CM | POA: Diagnosis present

## 2021-08-15 DIAGNOSIS — R1312 Dysphagia, oropharyngeal phase: Secondary | ICD-10-CM | POA: Diagnosis not present

## 2021-08-15 HISTORY — PX: BIOPSY: SHX5522

## 2021-08-15 HISTORY — PX: ESOPHAGOGASTRODUODENOSCOPY (EGD) WITH PROPOFOL: SHX5813

## 2021-08-15 LAB — CBC
HCT: 40.9 % (ref 36.0–46.0)
Hemoglobin: 13.7 g/dL (ref 12.0–15.0)
MCH: 30.9 pg (ref 26.0–34.0)
MCHC: 33.5 g/dL (ref 30.0–36.0)
MCV: 92.1 fL (ref 80.0–100.0)
Platelets: 160 10*3/uL (ref 150–400)
RBC: 4.44 MIL/uL (ref 3.87–5.11)
RDW: 12.7 % (ref 11.5–15.5)
WBC: 5 10*3/uL (ref 4.0–10.5)
nRBC: 0 % (ref 0.0–0.2)

## 2021-08-15 LAB — BASIC METABOLIC PANEL
Anion gap: 6 (ref 5–15)
BUN: 9 mg/dL (ref 8–23)
CO2: 26 mmol/L (ref 22–32)
Calcium: 8.9 mg/dL (ref 8.9–10.3)
Chloride: 103 mmol/L (ref 98–111)
Creatinine, Ser: 0.86 mg/dL (ref 0.44–1.00)
GFR, Estimated: >60 mL/min (ref >60)
Glucose, Bld: 106 mg/dL — ABNORMAL HIGH (ref 70–99)
Potassium: 3.7 mmol/L (ref 3.5–5.1)
Sodium: 135 mmol/L (ref 135–145)

## 2021-08-15 LAB — HIV ANTIBODY (ROUTINE TESTING W REFLEX): HIV Screen 4th Generation wRfx: NONREACTIVE

## 2021-08-15 SURGERY — ESOPHAGOGASTRODUODENOSCOPY (EGD) WITH PROPOFOL
Anesthesia: Monitor Anesthesia Care

## 2021-08-15 MED ORDER — HYDROMORPHONE HCL 1 MG/ML IJ SOLN
INTRAMUSCULAR | Status: AC
  Filled 2021-08-15: qty 1

## 2021-08-15 MED ORDER — PROPOFOL 500 MG/50ML IV EMUL
INTRAVENOUS | Status: DC | PRN
  Administered 2021-08-15: 150 ug/kg/min via INTRAVENOUS

## 2021-08-15 MED ORDER — LACTATED RINGERS IV SOLN: INTRAVENOUS | Status: DC

## 2021-08-15 MED ORDER — PROPOFOL 10 MG/ML IV BOLUS
INTRAVENOUS | Status: DC | PRN
  Administered 2021-08-15 (×2): 50 mg via INTRAVENOUS

## 2021-08-15 MED ORDER — HYDROMORPHONE HCL 1 MG/ML IJ SOLN
1.0000 mg | INTRAMUSCULAR | Status: DC | PRN
  Administered 2021-08-15 – 2021-08-17 (×12): 1 mg via INTRAVENOUS
  Filled 2021-08-15 (×12): qty 1

## 2021-08-15 MED ORDER — LIDOCAINE 2% (20 MG/ML) 5 ML SYRINGE
INTRAMUSCULAR | Status: DC | PRN
  Administered 2021-08-15: 100 mg via INTRAVENOUS

## 2021-08-15 MED ORDER — TRAZODONE HCL 50 MG PO TABS: 50.0000 mg | ORAL_TABLET | Freq: Every evening | ORAL | Status: DC | PRN

## 2021-08-15 MED ORDER — HYDRALAZINE HCL 20 MG/ML IJ SOLN
INTRAMUSCULAR | Status: AC
  Filled 2021-08-15: qty 1

## 2021-08-15 MED ORDER — SODIUM CHLORIDE 0.9 % IV SOLN: INTRAVENOUS | Status: DC

## 2021-08-15 MED ORDER — HYDROMORPHONE HCL 1 MG/ML IJ SOLN
1.0000 mg | Freq: Once | INTRAMUSCULAR | Status: AC
  Administered 2021-08-15: 1 mg via INTRAVENOUS

## 2021-08-15 MED ORDER — SENNOSIDES-DOCUSATE SODIUM 8.6-50 MG PO TABS
1.0000 | ORAL_TABLET | Freq: Every day | ORAL | Status: DC
  Administered 2021-08-17 – 2021-08-27 (×5): 1 via ORAL
  Filled 2021-08-15 (×9): qty 1

## 2021-08-15 MED ORDER — ACETAMINOPHEN 325 MG PO TABS: 650.0000 mg | ORAL_TABLET | Freq: Four times a day (QID) | ORAL | Status: DC | PRN

## 2021-08-15 MED ORDER — SENNOSIDES-DOCUSATE SODIUM 8.6-50 MG PO TABS: 1.0000 | ORAL_TABLET | Freq: Every evening | ORAL | Status: DC | PRN

## 2021-08-15 MED ORDER — HYDRALAZINE HCL 20 MG/ML IJ SOLN
10.0000 mg | Freq: Once | INTRAMUSCULAR | Status: AC
  Administered 2021-08-15: 10 mg via INTRAVENOUS

## 2021-08-15 MED ORDER — HYDRALAZINE HCL 20 MG/ML IJ SOLN
10.0000 mg | INTRAMUSCULAR | Status: DC | PRN
  Administered 2021-08-20: 10 mg via INTRAVENOUS
  Filled 2021-08-15: qty 1

## 2021-08-15 SURGICAL SUPPLY — 15 items

## 2021-08-15 NOTE — Interval H&P Note (Signed)
History and Physical Interval Note: 60/female with dysphagia for EGD with possible balloon dilation with propofol.  08/15/2021 1:26 PM  Brittany Mccarty  has presented today for EGD with possible balloon dilation, with the diagnosis of dysphagia.  The various methods of treatment have been discussed with the patient and family. After consideration of risks, benefits and other options for treatment, the patient has consented to  Procedure(s): ESOPHAGOGASTRODUODENOSCOPY (EGD) WITH PROPOFOL (N/A) as a surgical intervention.  The patient's history has been reviewed, patient examined, no change in status, stable for surgery.  I have reviewed the patient's chart and labs.  Questions were answered to the patient's satisfaction.     [name]

## 2021-08-15 NOTE — Op Note (Signed)
[facility] Patient Name: Brittany Mccarty Procedure Date: 08/15/2021 MRN: [id number] Attending MD: [name] , MD Date of Birth: [birth year] CSN: [id number] Age: 60 Admit Type: Inpatient Procedure:                Upper GI endoscopy Indications:              Dysphagia Providers:                [name], MD, Dulcy Fanny, Laverda Sorenson,                            Technician, Margurite Auerbach Referring MD:             [facility] Medicines:                Monitored Anesthesia Care Complications:            No immediate complications. Estimated blood loss:                            Minimal. Estimated Blood Loss:     Estimated blood loss was minimal. Procedure:                Pre-Anesthesia Assessment:                           - Prior to the procedure, a History and Physical                            was performed, and patient medications and                            allergies were reviewed. The patient's tolerance of                            previous anesthesia was also reviewed. The risks                            and benefits of the procedure and the sedation                            options and risks were discussed with the patient.                            All questions were answered, and informed consent                            was obtained. Prior Anticoagulants: The patient has                            taken Eliquis (apixaban), last dose was 2 days                            prior to procedure. ASA Grade Assessment: III - A  patient with severe systemic disease. After                            reviewing the risks and benefits, the patient was                            deemed in satisfactory condition to undergo the                            procedure.                           After obtaining informed consent, the endoscope was                            passed under direct vision. Throughout the                             procedure, the patient's blood pressure, pulse, and                            oxygen saturations were monitored continuously. The                            [id number]-[id number] ([id number]) Olympus endoscope was introduced                            through the mouth, and advanced to the second part                            of duodenum. The upper GI endoscopy was                            accomplished without difficulty. The patient                            tolerated the procedure well. Scope In: Scope Out: Findings:      The upper third of the esophagus was normal.      One benign-appearing, intrinsic moderate (circumferential scarring or       stenosis; an endoscope may pass) stenosis was found 30 to 33 cm from the       incisors. The stenosis was traversed with moderate resistance. Biopsies       were taken with a cold forceps for histology. There was no ulceration or       mass noted, the mucosa appeared unremarkable, it was not clear if the       stenosis was due to extrinsic compression.      The gastroesophageal junction was normal at 35 cm.      The entire examined stomach was normal.      The cardia and gastric fundus were normal on retroflexion.      The examined duodenum was normal. Impression:               - Normal upper third of esophagus.                           -  Benign-appearing esophageal stenosis. ? extrinsic                            compression. Biopsied.                           - Normal gastroesophageal junction.                           - Normal stomach.                           - Normal examined duodenum. Moderate Sedation:      Patient did not receive moderate sedation for this procedure, but       instead received monitored anesthesia care. Recommendation:           - Await pathology results.                           - Clear liquid diet. Procedure Code(s):        --- Professional ---                           [phone], Esophagogastroduodenoscopy,  flexible,                            transoral; with biopsy, single or multiple Diagnosis Code(s):        --- Professional ---                           K22.2, Esophageal obstruction                           R13.10, Dysphagia, unspecified CPT copyright 2019 [employer]. All rights reserved. The codes documented in this report are preliminary and upon coder review may  be revised to meet current compliance requirements. [name], MD 08/15/2021 2:04:06 PM This report has been signed electronically. Number of Addenda: 0

## 2021-08-15 NOTE — Progress Notes (Signed)
Brief Oncology Note:  We were notified about the patient's admission. I stopped to visit with the patient, but she was in endoscopy. Chart reviewed. She was admitted with complaints of progressive difficulty swallowing X 2 weeks. Pills, food, liquids have been getting stuck in her throat. MBS last hospitalization unremarkable. MRI brain and CTA chest without findings to explain cause of symptoms. Has been seen by GI. Placed on PPI. EGD today.     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  She recently received a Medrol Dosepak and her Brittany Mccarty was held for 7 to 10 days.  Brittany Mccarty has been resumed.  She may continue on Brittany Mccarty at this time if she is able to swallow his medication.  However, if she is unable to swallow the medication, she may hold for now. -Agree with GI evaluation for evaluation of dysphagia.  Await results of EGD.  May consider ENT evaluation if EGD unremarkable.  Brittany Bussing, DNP, AGPCNP-BC, AOCNP

## 2021-08-15 NOTE — Anesthesia Preprocedure Evaluation (Addendum)
Anesthesia Evaluation  Patient identified by MRN, date of birth, ID band Patient awake    Reviewed: Allergy & Precautions, NPO status , Patient's Chart, lab work & pertinent test results  History of Anesthesia Complications Negative for: history of anesthetic complications  Airway Mallampati: II  TM Distance: >3 FB Neck ROM: Full    Dental  (+) Dental Advisory Given, Teeth Intact   Pulmonary shortness of breath, PE NSCL CA    + decreased breath sounds      Cardiovascular negative cardio ROS   Rhythm:Regular     Neuro/Psych  Headaches, negative psych ROS   GI/Hepatic Neg liver ROS, GERD  Medicated,dysphagia   Endo/Other  negative endocrine ROS  Renal/GU negative Renal ROS     Musculoskeletal negative musculoskeletal ROS (+)   Abdominal   Peds  Hematology negative hematology ROS (+) Lab Results      Component                Value               Date                      WBC                      5.0                 08/15/2021                HGB                      13.7                08/15/2021                HCT                      40.9                08/15/2021                MCV                      92.1                08/15/2021                PLT                      160                 08/15/2021              Anesthesia Other Findings   Reproductive/Obstetrics                             Anesthesia Physical Anesthesia Plan  ASA: 3  Anesthesia Plan: MAC   Post-op Pain Management:    Induction: Intravenous  PONV Risk Score and Plan: 2 and Propofol infusion and Treatment may vary due to age or medical condition  Airway Management Planned: Nasal Cannula  Additional Equipment: None  Intra-op Plan:   Post-operative Plan:   Informed Consent: I have reviewed the patients History and Physical, chart, labs and discussed the procedure including the risks, benefits and  alternatives for the proposed anesthesia with the patient  or authorized representative who has indicated his/her understanding and acceptance.     Dental advisory given  Plan Discussed with: CRNA and Anesthesiologist  Anesthesia Plan Comments: (Refuses all blood products even if needed to save life)       Anesthesia Quick Evaluation

## 2021-08-15 NOTE — Progress Notes (Signed)
PROGRESS NOTE    Brittany Mccarty  JME:[id number] DOB: 10/10/1961 DOA: 08/14/2021 PCP: Pcp, No   Brief Narrative:  60 y.o. female with history of metastatic lung cancer stage IV on chemotherapy, PE on Eliquis, dysphagia and recent hospitalization from 8/27-8/29 for acute PE and dysphagia returning with progressive dysphagia.  Patient has dysphagia both to solid and liquid, GI team consulted.  Recently during her previous admission she underwent MRI brain which was negative, CTA chest which showed suspected radiation changes in the right hemithorax and a 2.3 cm calcified right peritracheal nodule.   Assessment & Plan:   Active Problems:   Pulmonary embolism (HCC)   Metastatic cancer ([place])   Dysphagia   Dysphagia/odynophagia- Recent MBS unremarkable.  MRI brain and CT head are negative. - GI team consulted, plans for EGD today   Stage IV lung cancer with brain mets -Followed by Dr. Earlie Server outpatient.  Developed drug-induced pneumonitis and Tagrisso was held.  Now she is back on it.  Oncology has been consulted   Wheeze/cough/shortness of breath -Improved.  Continue as needed bronchodilators   History of recurrent PE -On Eliquis at home, currently on hold for EGD   Elevated blood pressure -As needed hydralazine    DVT prophylaxis: Eliquis Code Status: Full code Family Communication:    Patient to stay in the hospital today as she is unable to tolerate orals at all getting IV fluids and going for endoscopy today.  Dispo: The patient is from: Home              Anticipated d/c is to: Home              Patient currently is not medically stable to d/c.   Difficult to place patient No         Subjective: Having extreme difficulty with tolerating any form of oral food.  Review of Systems Otherwise negative except as per HPI, including: General: Denies fever, chills, night sweats or unintended weight loss. Resp: Denies cough, wheezing, shortness of  breath. Cardiac: Denies chest pain, palpitations, orthopnea, paroxysmal nocturnal dyspnea. GI: Denies abdominal pain, nausea, vomiting, diarrhea or constipation GU: Denies dysuria, frequency, hesitancy or incontinence MS: Denies muscle aches, joint pain or swelling Neuro: Denies headache, neurologic deficits (focal weakness, numbness, tingling), abnormal gait Psych: Denies anxiety, depression, SI/HI/AVH Skin: Denies new rashes or lesions ID: Denies sick contacts, exotic exposures, travel  Examination:  General exam: Appears calm and comfortable  Respiratory system: Clear to auscultation. Respiratory effort normal.  No stridor present in the neck Cardiovascular system: S1 & S2 heard, RRR. No JVD, murmurs, rubs, gallops or clicks. No pedal edema. Gastrointestinal system: Abdomen is nondistended, soft and nontender. No organomegaly or masses felt. Normal bowel sounds heard. Central nervous system: Alert and oriented. No focal neurological deficits. Extremities: Symmetric 5 x 5 power. Skin: No rashes, lesions or ulcers Psychiatry: Judgement and insight appear normal. Mood & affect appropriate.     Objective: Vitals:   08/15/21 0009 08/15/21 0406 08/15/21 0457 08/15/21 0755  BP: (!) 148/96  (!) 144/95 (!) 158/97  Pulse: 70  72 70  Resp: 16  16 18   Temp: 98.1 F (36.7 C)  98.4 F (36.9 C) 97.6 F (36.4 C)  TempSrc: Oral  Oral Oral  SpO2: 100%  100% 100%  Weight:  78.6 kg    Height:  5\' 5"  (1.651 m)      Intake/Output Summary (Last 24 hours) at 08/15/2021 0804 Last data filed at 08/15/2021 0403 Gross per  24 hour  Intake 736.66 ml  Output --  Net 736.66 ml   Filed Weights   08/15/21 0406  Weight: 78.6 kg     Data Reviewed:   CBC: Recent Labs  Lab 08/14/21 1258 08/15/21 0334  WBC 6.8 5.0  NEUTROABS 5.6  --   HGB 15.4* 13.7  HCT 49.5* 40.9  MCV 99.8 92.1  PLT 153 503   Basic Metabolic Panel: Recent Labs  Lab 08/14/21 1407 08/15/21 0334  NA 136 135  K 4.2  3.7  CL 98 103  CO2 27 26  GLUCOSE 101* 106*  BUN 11 9  CREATININE 0.99 0.86  CALCIUM 9.7 8.9   GFR: Estimated Creatinine Clearance: 71.1 mL/min (by C-G formula based on SCr of 0.86 mg/dL). Liver Function Tests: Recent Labs  Lab 08/14/21 1407  AST 26  ALT 16  ALKPHOS 98  BILITOT 1.0  PROT 7.1  ALBUMIN 3.9   Recent Labs  Lab 08/14/21 1408  LIPASE 30   No results for input(s): AMMONIA in the last 168 hours. Coagulation Profile: No results for input(s): INR, PROTIME in the last 168 hours. Cardiac Enzymes: No results for input(s): CKTOTAL, CKMB, CKMBINDEX, TROPONINI in the last 168 hours. BNP (last 3 results) No results for input(s): PROBNP in the last 8760 hours. HbA1C: No results for input(s): HGBA1C in the last 72 hours. CBG: No results for input(s): GLUCAP in the last 168 hours. Lipid Profile: No results for input(s): CHOL, HDL, LDLCALC, TRIG, CHOLHDL, LDLDIRECT in the last 72 hours. Thyroid Function Tests: Recent Labs    08/14/21 1915  TSH 1.204   Anemia Panel: No results for input(s): VITAMINB12, FOLATE, FERRITIN, TIBC, IRON, RETICCTPCT in the last 72 hours. Sepsis Labs: No results for input(s): PROCALCITON, LATICACIDVEN in the last 168 hours.  Recent Results (from the past 240 hour(s))  Resp Panel by RT-PCR (Flu A&B, Covid) Nasopharyngeal Swab     Status: None   Collection Time: 08/14/21  3:22 PM   Specimen: Nasopharyngeal Swab; Nasopharyngeal(NP) swabs in vial transport medium  Result Value Ref Range Status   SARS Coronavirus 2 by RT PCR NEGATIVE NEGATIVE Final    Comment: (NOTE) SARS-CoV-2 target nucleic acids are NOT DETECTED.  The SARS-CoV-2 RNA is generally detectable in upper respiratory specimens during the acute phase of infection. The lowest concentration of SARS-CoV-2 viral copies this assay can detect is 138 copies/mL. A negative result does not preclude SARS-Cov-2 infection and should not be used as the sole basis for treatment or other  patient management decisions. A negative result may occur with  improper specimen collection/handling, submission of specimen other than nasopharyngeal swab, presence of viral mutation(s) within the areas targeted by this assay, and inadequate number of viral copies(<138 copies/mL). A negative result must be combined with clinical observations, patient history, and epidemiological information. The expected result is Negative.  Fact Sheet for Patients:  EntrepreneurPulse.com.au  Fact Sheet for Healthcare Providers:  IncredibleEmployment.be  This test is no t yet approved or cleared by the [place] FDA and  has been authorized for detection and/or diagnosis of SARS-CoV-2 by FDA under an Emergency Use Authorization (EUA). This EUA will remain  in effect (meaning this test can be used) for the duration of the COVID-19 declaration under Section 564(b)(1) of the Act, 21 U.S.C.section 360bbb-3(b)(1), unless the authorization is terminated  or revoked sooner.       Influenza A by PCR NEGATIVE NEGATIVE Final   Influenza B by PCR NEGATIVE NEGATIVE Final  Comment: (NOTE) The Xpert Xpress SARS-CoV-2/FLU/RSV plus assay is intended as an aid in the diagnosis of influenza from Nasopharyngeal swab specimens and should not be used as a sole basis for treatment. Nasal washings and aspirates are unacceptable for Xpert Xpress SARS-CoV-2/FLU/RSV testing.  Fact Sheet for Patients: EntrepreneurPulse.com.au  Fact Sheet for Healthcare Providers: IncredibleEmployment.be  This test is not yet approved or cleared by the [place] FDA and has been authorized for detection and/or diagnosis of SARS-CoV-2 by FDA under an Emergency Use Authorization (EUA). This EUA will remain in effect (meaning this test can be used) for the duration of the COVID-19 declaration under Section 564(b)(1) of the Act, 21 U.S.C. section  360bbb-3(b)(1), unless the authorization is terminated or revoked.  Performed at [facility], [place] [address], [place], [place] [address]          Radiology Studies: DG Chest 2 View  Result Date: 08/14/2021 CLINICAL DATA:  Cough, dysphagia, shortness of breath EXAM: CHEST - 2 VIEW COMPARISON:  07/29/2021 FINDINGS: The heart size and mediastinal contours are within normal limits. Unchanged post treatment appearance of the right chest with volume loss of the right hemithorax and a small right pleural effusion. No new airspace opacity. The visualized skeletal structures are unremarkable. IMPRESSION: Unchanged post treatment appearance of the right chest with volume loss of the right hemithorax and a small right pleural effusion. No new airspace opacity. Electronically Signed   By: Eddie Candle M.D.   On: 08/14/2021 12:52        Scheduled Meds:  fentaNYL  1 patch Transdermal Q72H   osimertinib mesylate  80 mg Oral Daily   pantoprazole (PROTONIX) IV  40 mg Intravenous Q24H   Continuous Infusions:  dextrose 5 % and 0.9% NaCl 100 mL/hr at 08/15/21 0628     LOS: 0 days   Time spent= 35 mins    Garen Woolbright Arsenio Loader, MD [facility]  If 7PM-7AM, please contact night-coverage  08/15/2021, 8:04 AM

## 2021-08-15 NOTE — Plan of Care (Signed)

## 2021-08-15 NOTE — Progress Notes (Signed)
Patient BP high in recovery (see flowsheets), per dr Doroteo Glassman give 10 mg of hydralazine and send patient back to the floor. Floor RN made aware of patients BP.

## 2021-08-15 NOTE — Transfer of Care (Signed)
Immediate Anesthesia Transfer of Care Note  Patient: Brittany Mccarty  Procedure(s) Performed: ESOPHAGOGASTRODUODENOSCOPY (EGD) WITH PROPOFOL BIOPSY  Patient Location: PACU and Endoscopy Unit  Anesthesia Type:MAC  Level of Consciousness: drowsy  Airway & Oxygen Therapy: Patient Spontanous Breathing and Patient connected to face mask  Post-op Assessment: Report given to RN and Post -op Vital signs reviewed and stable  Post vital signs: Reviewed and stable  Last Vitals:  Vitals Value Taken Time  BP    Temp    Pulse    Resp    SpO2      Last Pain:  Vitals:   08/15/21 1222  TempSrc: Oral  PainSc: 6       Patients Stated Pain Goal: 5 (00/37/94 4461)  Complications: No notable events documented.

## 2021-08-15 NOTE — Plan of Care (Signed)
  Problem: Education: Goal: Knowledge of General Education information will improve Description: Including pain rating scale, medication(s)/side effects and non-pharmacologic comfort measures Outcome: Completed/Met

## 2021-08-16 ENCOUNTER — Inpatient Hospital Stay (HOSPITAL_COMMUNITY): Payer: 59

## 2021-08-16 ENCOUNTER — Inpatient Hospital Stay: Payer: 59 | Admitting: Internal Medicine

## 2021-08-16 ENCOUNTER — Encounter: Payer: Self-pay | Admitting: Emergency Medicine

## 2021-08-16 ENCOUNTER — Inpatient Hospital Stay: Payer: 59

## 2021-08-16 ENCOUNTER — Telehealth: Payer: Self-pay | Admitting: Medical Oncology

## 2021-08-16 DIAGNOSIS — R1312 Dysphagia, oropharyngeal phase: Secondary | ICD-10-CM | POA: Diagnosis not present

## 2021-08-16 DIAGNOSIS — I2699 Other pulmonary embolism without acute cor pulmonale: Secondary | ICD-10-CM | POA: Diagnosis not present

## 2021-08-16 DIAGNOSIS — E43 Unspecified severe protein-calorie malnutrition: Secondary | ICD-10-CM | POA: Insufficient documentation

## 2021-08-16 LAB — CBC
HCT: 39.6 % (ref 36.0–46.0)
Hemoglobin: 13 g/dL (ref 12.0–15.0)
MCH: 30.4 pg (ref 26.0–34.0)
MCHC: 32.8 g/dL (ref 30.0–36.0)
MCV: 92.5 fL (ref 80.0–100.0)
Platelets: 164 10*3/uL (ref 150–400)
RBC: 4.28 MIL/uL (ref 3.87–5.11)
RDW: 12.7 % (ref 11.5–15.5)
WBC: 6.1 10*3/uL (ref 4.0–10.5)
nRBC: 0 % (ref 0.0–0.2)

## 2021-08-16 LAB — BASIC METABOLIC PANEL
Anion gap: 7 (ref 5–15)
BUN: 5 mg/dL — ABNORMAL LOW (ref 8–23)
CO2: 28 mmol/L (ref 22–32)
Calcium: 9.5 mg/dL (ref 8.9–10.3)
Chloride: 107 mmol/L (ref 98–111)
Creatinine, Ser: 0.79 mg/dL (ref 0.44–1.00)
GFR, Estimated: >60 mL/min (ref >60)
Glucose, Bld: 120 mg/dL — ABNORMAL HIGH (ref 70–99)
Potassium: 4.5 mmol/L (ref 3.5–5.1)
Sodium: 142 mmol/L (ref 135–145)

## 2021-08-16 LAB — MAGNESIUM: Magnesium: 2.2 mg/dL (ref 1.7–2.4)

## 2021-08-16 LAB — SURGICAL PATHOLOGY

## 2021-08-16 MED ORDER — SODIUM CHLORIDE 0.9 % IV SOLN
25.0000 mg | Freq: Four times a day (QID) | INTRAVENOUS | Status: DC | PRN
  Administered 2021-08-16 – 2021-08-24 (×12): 25 mg via INTRAVENOUS
  Filled 2021-08-16 (×2): qty 1
  Filled 2021-08-16 (×2): qty 25
  Filled 2021-08-16: qty 1
  Filled 2021-08-16: qty 25
  Filled 2021-08-16: qty 1
  Filled 2021-08-16 (×4): qty 25
  Filled 2021-08-16: qty 1
  Filled 2021-08-16 (×5): qty 25

## 2021-08-16 MED ORDER — IPRATROPIUM-ALBUTEROL 0.5-2.5 (3) MG/3ML IN SOLN
3.0000 mL | Freq: Two times a day (BID) | RESPIRATORY_TRACT | Status: DC
  Administered 2021-08-16 – 2021-08-19 (×5): 3 mL via RESPIRATORY_TRACT
  Filled 2021-08-16 (×6): qty 3

## 2021-08-16 MED ORDER — IOHEXOL 350 MG/ML SOLN
60.0000 mL | Freq: Once | INTRAVENOUS | Status: AC | PRN
  Administered 2021-08-16: 60 mL via INTRAVENOUS

## 2021-08-16 NOTE — Progress Notes (Signed)
[facility]astroenterology Progress Note  Brittany Mccarty 60 y.o. 1961/03/09  CC:  Dysphagia in patient with Stage IV non small cell lung cancer with metastatic disease.   Subjective: Patient continues to be unable to drink fluids, states even with ice chips after a while will feel regurgitation of white foam.  Patient with minimal nausea this morning.  Patient denies fever, chills, abdominal pain.  Patient has not had a bowel movement since Saturday.  At that time had to do enema and suppository for that.  Denies blood in the stool, melena. Daughter Vicente Males, RN, in room with patient.  ROS : Review of Systems  Constitutional:  Positive for malaise/fatigue. Negative for chills, fever and weight loss.  HENT:  Negative for sore throat.   Respiratory:  Positive for cough and shortness of breath.   Cardiovascular:  Negative for chest pain.  Gastrointestinal:  Positive for constipation, nausea and vomiting. Negative for abdominal pain, blood in stool, diarrhea, heartburn and melena.  Musculoskeletal:  Negative for falls.  Neurological:  Negative for focal weakness.  Psychiatric/Behavioral:  Negative for memory loss. The patient is nervous/anxious.    Endoscopy 08/15/2021 that showed benign-appearing esophageal stenosis which was biopsied pending, possible extrinsic compression.  Objective: Vital signs in last 24 hours: Vitals:   08/15/21 2317 08/16/21 0405  BP: (!) 173/98 (!) 170/99  Pulse: 92 96  Resp: (!) 21 16  Temp: 98.6 F (37 C) 98.4 F (36.9 C)  SpO2: 100% 100%    Physical Exam: Physical Exam Constitutional:      General: She is not in acute distress.    Comments: Appears uncomfortable in chair  Eyes:     Conjunctiva/sclera: Conjunctivae normal.     Pupils: Pupils are equal, round, and reactive to light.  Abdominal:     General: There is no distension.     Palpations: Abdomen is soft.     Tenderness: There is no abdominal tenderness. There is no guarding.  Musculoskeletal:         General: Normal range of motion.  Skin:    Coloration: Skin is not jaundiced.  Neurological:     General: No focal deficit present.     Mental Status: She is alert.     Lab Results: Recent Labs    08/15/21 0334 08/16/21 0342  NA 135 142  K 3.7 4.5  CL 103 107  CO2 26 28  GLUCOSE 106* 120*  BUN 9 5*  CREATININE 0.86 0.79  CALCIUM 8.9 9.5  MG  --  2.2   Recent Labs    08/14/21 1407  AST 26  ALT 16  ALKPHOS 98  BILITOT 1.0  PROT 7.1  ALBUMIN 3.9   Recent Labs    08/14/21 1258 08/15/21 0334 08/16/21 0342  WBC 6.8 5.0 6.1  NEUTROABS 5.6  --   --   HGB 15.4* 13.7 13.0  HCT 49.5* 40.9 39.6  MCV 99.8 92.1 92.5  PLT 153 160 164   No results for input(s): LABPROT, INR in the last 72 hours.  Lab Results: Results for orders placed or performed during the hospital encounter of 08/14/21 (from the past 48 hour(s))  CBC with Differential     Status: Abnormal   Collection Time: 08/14/21 12:58 PM  Result Value Ref Range   WBC 6.8 4.0 - 10.5 K/uL   RBC 4.96 3.87 - 5.11 MIL/uL   Hemoglobin 15.4 (H) 12.0 - 15.0 g/dL   HCT 49.5 (H) 36.0 - 46.0 %   MCV 99.8  80.0 - 100.0 fL   MCH 31.0 26.0 - 34.0 pg   MCHC 31.1 30.0 - 36.0 g/dL   RDW 12.7 11.5 - 15.5 %   Platelets 153 150 - 400 K/uL   nRBC 0.0 0.0 - 0.2 %   Neutrophils Relative % 84 %   Neutro Abs 5.6 1.7 - 7.7 K/uL   Lymphocytes Relative 10 %   Lymphs Abs 0.7 0.7 - 4.0 K/uL   Monocytes Relative 6 %   Monocytes Absolute 0.4 0.1 - 1.0 K/uL   Eosinophils Relative 0 %   Eosinophils Absolute 0.0 0.0 - 0.5 K/uL   Basophils Relative 0 %   Basophils Absolute 0.0 0.0 - 0.1 K/uL   Immature Granulocytes 0 %   Abs Immature Granulocytes 0.01 0.00 - 0.07 K/uL    Comment: Performed at [facility], [place] [address], [place], [place] [address]  Comprehensive metabolic panel     Status: Abnormal   Collection Time: 08/14/21  2:07 PM  Result Value Ref Range   Sodium 136 135 - 145 mmol/L   Potassium 4.2 3.5  - 5.1 mmol/L   Chloride 98 98 - 111 mmol/L   CO2 27 22 - 32 mmol/L   Glucose, Bld 101 (H) 70 - 99 mg/dL    Comment: Glucose reference range applies only to samples taken after fasting for at least 8 hours.   BUN 11 8 - 23 mg/dL   Creatinine, Ser 0.99 0.44 - 1.00 mg/dL   Calcium 9.7 8.9 - 10.3 mg/dL   Total Protein 7.1 6.5 - 8.1 g/dL   Albumin 3.9 3.5 - 5.0 g/dL   AST 26 15 - 41 U/L   ALT 16 0 - 44 U/L   Alkaline Phosphatase 98 38 - 126 U/L   Total Bilirubin 1.0 0.3 - 1.2 mg/dL   GFR, Estimated >60 >60 mL/min    Comment: (NOTE) Calculated using the CKD-EPI Creatinine Equation (2021)    Anion gap 11 5 - 15    Comment: Performed at [facility], [place] [address], [place], [place] [address]  Lipase, blood     Status: None   Collection Time: 08/14/21  2:08 PM  Result Value Ref Range   Lipase 30 11 - 51 U/L    Comment: Performed at [facility], [place] [address], [place], [place] [address]  Urinalysis, Routine w reflex microscopic     Status: Abnormal   Collection Time: 08/14/21  3:10 PM  Result Value Ref Range   Color, Urine YELLOW (A) YELLOW   APPearance CLEAR (A) CLEAR   Specific Gravity, Urine <1.005 (L) 1.005 - 1.030   pH 6.0 5.0 - 8.0   Glucose, UA NEGATIVE NEGATIVE mg/dL   Hgb urine dipstick NEGATIVE NEGATIVE   Bilirubin Urine NEGATIVE NEGATIVE   Ketones, ur 15 (A) NEGATIVE mg/dL   Protein, ur NEGATIVE NEGATIVE mg/dL   Nitrite NEGATIVE NEGATIVE   Leukocytes,Ua TRACE (A) NEGATIVE   WBC, UA 0-5 0 - 5 WBC/hpf   Bacteria, UA NONE SEEN NONE SEEN   Squamous Epithelial / LPF 0-5 0 - 5    Comment: Performed at [facility], [place] [address], [place],  [address]  Resp Panel by RT-PCR (Flu A&B, Covid) Nasopharyngeal Swab     Status: None   Collection Time: 08/14/21  3:22 PM   Specimen: Nasopharyngeal Swab; Nasopharyngeal(NP) swabs in vial transport medium  Result Value Ref Range   SARS Coronavirus 2 by RT PCR NEGATIVE  NEGATIVE    Comment: (NOTE) SARS-CoV-2 target nucleic acids are NOT DETECTED.  The SARS-CoV-2 RNA is generally detectable in upper respiratory specimens during the acute phase of infection. The lowest concentration of SARS-CoV-2 viral copies this assay can detect is 138 copies/mL. A negative result does not preclude SARS-Cov-2 infection and should not be used as the sole basis for treatment or other patient management decisions. A negative result may occur with  improper specimen collection/handling, submission of specimen other than nasopharyngeal swab, presence of viral mutation(s) within the areas targeted by this assay, and inadequate number of viral copies(<138 copies/mL). A negative result must be combined with clinical observations, patient history, and epidemiological information. The expected result is Negative.  Fact Sheet for Patients:  EntrepreneurPulse.com.au  Fact Sheet for Healthcare Providers:  IncredibleEmployment.be  This test is no t yet approved or cleared by the [place] FDA and  has been authorized for detection and/or diagnosis of SARS-CoV-2 by FDA under an Emergency Use Authorization (EUA). This EUA will remain  in effect (meaning this test can be used) for the duration of the COVID-19 declaration under Section 564(b)(1) of the Act, 21 U.S.C.section 360bbb-3(b)(1), unless the authorization is terminated  or revoked sooner.       Influenza A by PCR NEGATIVE NEGATIVE   Influenza B by PCR NEGATIVE NEGATIVE    Comment: (NOTE) The Xpert Xpress SARS-CoV-2/FLU/RSV plus assay is intended as an aid in the diagnosis of influenza from Nasopharyngeal swab specimens and should not be used as a sole basis for treatment. Nasal washings and aspirates are unacceptable for Xpert Xpress SARS-CoV-2/FLU/RSV testing.  Fact Sheet for Patients: EntrepreneurPulse.com.au  Fact Sheet for Healthcare  Providers: IncredibleEmployment.be  This test is not yet approved or cleared by the [place] FDA and has been authorized for detection and/or diagnosis of SARS-CoV-2 by FDA under an Emergency Use Authorization (EUA). This EUA will remain in effect (meaning this test can be used) for the duration of the COVID-19 declaration under Section 564(b)(1) of the Act, 21 U.S.C. section 360bbb-3(b)(1), unless the authorization is terminated or revoked.  Performed at [facility], [place] [address], [place], [place] [address]   HIV Antibody (routine testing w rflx)     Status: None   Collection Time: 08/14/21  7:15 PM  Result Value Ref Range   HIV Screen 4th Generation wRfx Non Reactive Non Reactive    Comment: Performed at [facility], [place] [address], [place], [place] [address]  TSH     Status: None   Collection Time: 08/14/21  7:15 PM  Result Value Ref Range   TSH 1.204 0.350 - 4.500 uIU/mL    Comment: Performed by a 3rd Generation assay with a functional sensitivity of <=0.01 uIU/mL. Performed at [facility], [place] [address], [place], [place] [address]   Basic metabolic panel     Status: Abnormal   Collection Time: 08/15/21  3:34 AM  Result Value Ref Range   Sodium 135 135 - 145 mmol/L   Potassium 3.7 3.5 - 5.1 mmol/L   Chloride 103 98 - 111 mmol/L   CO2 26 22 - 32 mmol/L   Glucose, Bld 106 (H) 70 - 99 mg/dL    Comment: Glucose reference range applies only to samples taken after fasting for at least 8 hours.   BUN 9 8 - 23 mg/dL   Creatinine, Ser 0.86 0.44 - 1.00 mg/dL   Calcium 8.9 8.9 - 10.3 mg/dL   GFR, Estimated >  60 >60 mL/min    Comment: (NOTE) Calculated using the CKD-EPI Creatinine Equation (2021)    Anion gap 6 5 - 15    Comment: Performed at [facility], [place] [address], [place], [place] [address]  CBC     Status: None   Collection Time: 08/15/21  3:34 AM  Result Value Ref Range   WBC  5.0 4.0 - 10.5 K/uL   RBC 4.44 3.87 - 5.11 MIL/uL   Hemoglobin 13.7 12.0 - 15.0 g/dL   HCT 40.9 36.0 - 46.0 %   MCV 92.1 80.0 - 100.0 fL    Comment: REPEATED TO VERIFY DELTA CHECK NOTED    MCH 30.9 26.0 - 34.0 pg   MCHC 33.5 30.0 - 36.0 g/dL   RDW 12.7 11.5 - 15.5 %   Platelets 160 150 - 400 K/uL   nRBC 0.0 0.0 - 0.2 %    Comment: Performed at [facility], [place] [address], [place], [place] [address]  Basic metabolic panel     Status: Abnormal   Collection Time: 08/16/21  3:42 AM  Result Value Ref Range   Sodium 142 135 - 145 mmol/L    Comment: DELTA CHECK NOTED   Potassium 4.5 3.5 - 5.1 mmol/L    Comment: DELTA CHECK NOTED   Chloride 107 98 - 111 mmol/L   CO2 28 22 - 32 mmol/L   Glucose, Bld 120 (H) 70 - 99 mg/dL    Comment: Glucose reference range applies only to samples taken after fasting for at least 8 hours.   BUN 5 (L) 8 - 23 mg/dL   Creatinine, Ser 0.79 0.44 - 1.00 mg/dL   Calcium 9.5 8.9 - 10.3 mg/dL   GFR, Estimated >60 >60 mL/min    Comment: (NOTE) Calculated using the CKD-EPI Creatinine Equation (2021)    Anion gap 7 5 - 15    Comment: Performed at [facility], [place] [address], [place], [place] [address]  CBC     Status: None   Collection Time: 08/16/21  3:42 AM  Result Value Ref Range   WBC 6.1 4.0 - 10.5 K/uL   RBC 4.28 3.87 - 5.11 MIL/uL   Hemoglobin 13.0 12.0 - 15.0 g/dL   HCT 39.6 36.0 - 46.0 %   MCV 92.5 80.0 - 100.0 fL   MCH 30.4 26.0 - 34.0 pg   MCHC 32.8 30.0 - 36.0 g/dL   RDW 12.7 11.5 - 15.5 %   Platelets 164 150 - 400 K/uL   nRBC 0.0 0.0 - 0.2 %    Comment: Performed at [facility], [place] [address], [place], [place] [address]  Magnesium     Status: None   Collection Time: 08/16/21  3:42 AM  Result Value Ref Range   Magnesium 2.2 1.7 - 2.4 mg/dL    Comment: Performed at [facility], [place] [address], [place], [place] [address]    Assessment/Plan: Dysphagia in  patient with metastatic adenocarcinoma non-small cell lung cancer.  normal barium swallow 07/31/2021. EGD 08/15/2021 benign-appearing esophageal stenosis which was biopsied pending, possible extrinsic compression.  Pending biopsies Discussed case with oncology, states no obvious extrinsic compression. Continue full liquid diet at this time.  Since there was some resistance through the stenosis, will proceed with repeat endoscopy with dilatation- time pending I thoroughly discussed the procedure with the patient (at bedside) to include nature of the procedure, alternatives, benefits, and risks (including but not limited to bleeding, infection,  perforation, anesthesia/cardiac pulmonary complications).  Patient verbalized understanding and gave verbal consent to proceed with EGD with dilatation.  If patient continues to have dysphagia post dilatation, will get MBS    Vladimir Crofts PA-C 08/16/2021, 8:12 AM  Contact #  [phone]

## 2021-08-16 NOTE — Telephone Encounter (Signed)
Daughter requesting referral to Dr. Consuello Masse at [facility].

## 2021-08-16 NOTE — Assessment & Plan Note (Addendum)
Multifocal right-sided infiltrates of unclear etiology, has been empirically treated for possible medication induced pneumonitis (Tagrisso) with corticosteroids, also empirically with antibiotics.  Question whether this may be due to chronic recurrent aspiration given her dysphagia although her esophagram in the hospital was reassuring.  No clear evidence for opportunistic infection.  Plan to repeat her CT chest later this month.  If the infiltrates persist then I think bronchoscopy, BAL, transbronchial biopsies to rule out lymphangitic spread would be appropriate.  Needs to continue swallowing precautions to avoid any aspiration.   We will plan to repeat your CT scan of the chest without contrast in late September.  Depending on the results we will talk about whether you might benefit from bronchoscopy and lung tissue biopsy. Follow with Dr Lamonte Sakai next available after your CT scan so that we can review.

## 2021-08-16 NOTE — Plan of Care (Signed)
  Problem: Nutrition: Goal: Adequate nutrition will be maintained Outcome: Completed/Met   Problem: Activity: Goal: Risk for activity intolerance will decrease Outcome: Completed/Met

## 2021-08-16 NOTE — Assessment & Plan Note (Signed)
On anticoagulation 

## 2021-08-16 NOTE — H&P (View-Only) (Signed)
[facility] Gastroenterology Progress Note  Adeana Grilliot 60 y.o. 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   Subjective: Patient continues to be unable to drink fluids, states even with ice chips after a while will feel regurgitation of white foam.  Patient with minimal nausea this morning.  Patient denies fever, chills, abdominal pain.  Patient has not had a bowel movement since Saturday.  At that time had to do enema and suppository for that.  Denies blood in the stool, melena. Daughter Vicente Males, RN, in room with patient.  ROS : Review of Systems  Constitutional:  Positive for malaise/fatigue. Negative for chills, fever and weight loss.  HENT:  Negative for sore throat.   Respiratory:  Positive for cough and shortness of breath.   Cardiovascular:  Negative for chest pain.  Gastrointestinal:  Positive for constipation, nausea and vomiting. Negative for abdominal pain, blood in stool, diarrhea, heartburn and melena.  Musculoskeletal:  Negative for falls.  Neurological:  Negative for focal weakness.  Psychiatric/Behavioral:  Negative for memory loss. The patient is nervous/anxious.    Endoscopy 08/15/2021 that showed benign-appearing esophageal stenosis which was biopsied pending, possible extrinsic compression.  Objective: Vital signs in last 24 hours: Vitals:   08/15/21 2317 08/16/21 0405  BP: (!) 173/98 (!) 170/99  Pulse: 92 96  Resp: (!) 21 16  Temp: 98.6 F (37 C) 98.4 F (36.9 C)  SpO2: 100% 100%    Physical Exam: Physical Exam Constitutional:      General: She is not in acute distress.    Comments: Appears uncomfortable in chair  Eyes:     Conjunctiva/sclera: Conjunctivae normal.     Pupils: Pupils are equal, round, and reactive to light.  Abdominal:     General: There is no distension.     Palpations: Abdomen is soft.     Tenderness: There is no abdominal tenderness. There is no guarding.  Musculoskeletal:         General: Normal range of motion.  Skin:    Coloration: Skin is not jaundiced.  Neurological:     General: No focal deficit present.     Mental Status: She is alert.     Lab Results: Recent Labs    08/15/21 0334 08/16/21 0342  NA 135 142  K 3.7 4.5  CL 103 107  CO2 26 28  GLUCOSE 106* 120*  BUN 9 5*  CREATININE 0.86 0.79  CALCIUM 8.9 9.5  MG  --  2.2   Recent Labs    08/14/21 1407  AST 26  ALT 16  ALKPHOS 98  BILITOT 1.0  PROT 7.1  ALBUMIN 3.9   Recent Labs    08/14/21 1258 08/15/21 0334 08/16/21 0342  WBC 6.8 5.0 6.1  NEUTROABS 5.6  --   --   HGB 15.4* 13.7 13.0  HCT 49.5* 40.9 39.6  MCV 99.8 92.1 92.5  PLT 153 160 164   No results for input(s): LABPROT, INR in the last 72 hours.  Lab Results: Results for orders placed or performed during the hospital encounter of 08/14/21 (from the past 48 hour(s))  CBC with Differential     Status: Abnormal   Collection Time: 08/14/21 12:58 PM  Result Value Ref Range   WBC 6.8 4.0 - 10.5 K/uL   RBC 4.96 3.87 - 5.11 MIL/uL   Hemoglobin 15.4 (H) 12.0 - 15.0 g/dL   HCT 49.5 (H) 36.0 - 46.0 %   MCV 99.8  80.0 - 100.0 fL   MCH 31.0 26.0 - 34.0 pg   MCHC 31.1 30.0 - 36.0 g/dL   RDW 12.7 11.5 - 15.5 %   Platelets 153 150 - 400 K/uL   nRBC 0.0 0.0 - 0.2 %   Neutrophils Relative % 84 %   Neutro Abs 5.6 1.7 - 7.7 K/uL   Lymphocytes Relative 10 %   Lymphs Abs 0.7 0.7 - 4.0 K/uL   Monocytes Relative 6 %   Monocytes Absolute 0.4 0.1 - 1.0 K/uL   Eosinophils Relative 0 %   Eosinophils Absolute 0.0 0.0 - 0.5 K/uL   Basophils Relative 0 %   Basophils Absolute 0.0 0.0 - 0.1 K/uL   Immature Granulocytes 0 %   Abs Immature Granulocytes 0.01 0.00 - 0.07 K/uL    Comment: Performed at [facility], [place] [address] [place], [place] [address]  Comprehensive metabolic panel     Status: Abnormal   Collection Time: 08/14/21  2:07 PM  Result Value Ref Range   Sodium 136 135 - 145 mmol/L   Potassium 4.2 3.5  - 5.1 mmol/L   Chloride 98 98 - 111 mmol/L   CO2 27 22 - 32 mmol/L   Glucose, Bld 101 (H) 70 - 99 mg/dL    Comment: Glucose reference range applies only to samples taken after fasting for at least 8 hours.   BUN 11 8 - 23 mg/dL   Creatinine, Ser 0.99 0.44 - 1.00 mg/dL   Calcium 9.7 8.9 - 10.3 mg/dL   Total Protein 7.1 6.5 - 8.1 g/dL   Albumin 3.9 3.5 - 5.0 g/dL   AST 26 15 - 41 U/L   ALT 16 0 - 44 U/L   Alkaline Phosphatase 98 38 - 126 U/L   Total Bilirubin 1.0 0.3 - 1.2 mg/dL   GFR, Estimated >60 >60 mL/min    Comment: (NOTE) Calculated using the CKD-EPI Creatinine Equation (2021)    Anion gap 11 5 - 15    Comment: Performed at [facility], [place] [address] [place], [place] [address]  Lipase, blood     Status: None   Collection Time: 08/14/21  2:08 PM  Result Value Ref Range   Lipase 30 11 - 51 U/L    Comment: Performed at [facility], [place] [address] [place], [place] [address]  Urinalysis, Routine w reflex microscopic     Status: Abnormal   Collection Time: 08/14/21  3:10 PM  Result Value Ref Range   Color, Urine YELLOW (A) YELLOW   APPearance CLEAR (A) CLEAR   Specific Gravity, Urine <1.005 (L) 1.005 - 1.030   pH 6.0 5.0 - 8.0   Glucose, UA NEGATIVE NEGATIVE mg/dL   Hgb urine dipstick NEGATIVE NEGATIVE   Bilirubin Urine NEGATIVE NEGATIVE   Ketones, ur 15 (A) NEGATIVE mg/dL   Protein, ur NEGATIVE NEGATIVE mg/dL   Nitrite NEGATIVE NEGATIVE   Leukocytes,Ua TRACE (A) NEGATIVE   WBC, UA 0-5 0 - 5 WBC/hpf   Bacteria, UA NONE SEEN NONE SEEN   Squamous Epithelial / LPF 0-5 0 - 5    Comment: Performed at [facility], [place] [address] [place], [place] [address]  Resp Panel by RT-PCR (Flu A&B, Covid) Nasopharyngeal Swab     Status: None   Collection Time: 08/14/21  3:22 PM   Specimen: Nasopharyngeal Swab; Nasopharyngeal(NP) swabs in vial transport medium  Result Value Ref Range   SARS Coronavirus 2 by RT PCR NEGATIVE  NEGATIVE    Comment: (NOTE) SARS-CoV-2 target nucleic acids are NOT DETECTED.  The SARS-CoV-2 RNA is generally detectable in upper respiratory specimens during the acute phase of infection. The lowest concentration of SARS-CoV-2 viral copies this assay can detect is 138 copies/mL. A negative result does not preclude SARS-Cov-2 infection and should not be used as the sole basis for treatment or other patient management decisions. A negative result may occur with  improper specimen collection/handling, submission of specimen other than nasopharyngeal swab, presence of viral mutation(s) within the areas targeted by this assay, and inadequate number of viral copies(<138 copies/mL). A negative result must be combined with clinical observations, patient history, and epidemiological information. The expected result is Negative.  Fact Sheet for Patients:  EntrepreneurPulse.com.au  Fact Sheet for Healthcare Providers:  IncredibleEmployment.be  This test is no t yet approved or cleared by the [place] FDA and  has been authorized for detection and/or diagnosis of SARS-CoV-2 by FDA under an Emergency Use Authorization (EUA). This EUA will remain  in effect (meaning this test can be used) for the duration of the COVID-19 declaration under Section 564(b)(1) of the Act, 21 U.S.C.section 360bbb-3(b)(1), unless the authorization is terminated  or revoked sooner.       Influenza A by PCR NEGATIVE NEGATIVE   Influenza B by PCR NEGATIVE NEGATIVE    Comment: (NOTE) The Xpert Xpress SARS-CoV-2/FLU/RSV plus assay is intended as an aid in the diagnosis of influenza from Nasopharyngeal swab specimens and should not be used as a sole basis for treatment. Nasal washings and aspirates are unacceptable for Xpert Xpress SARS-CoV-2/FLU/RSV testing.  Fact Sheet for Patients: EntrepreneurPulse.com.au  Fact Sheet for Healthcare  Providers: IncredibleEmployment.be  This test is not yet approved or cleared by the [place] FDA and has been authorized for detection and/or diagnosis of SARS-CoV-2 by FDA under an Emergency Use Authorization (EUA). This EUA will remain in effect (meaning this test can be used) for the duration of the COVID-19 declaration under Section 564(b)(1) of the Act, 21 U.S.C. section 360bbb-3(b)(1), unless the authorization is terminated or revoked.  Performed at [facility], [place] [address], [place], [place] [address]   HIV Antibody (routine testing w rflx)     Status: None   Collection Time: 08/14/21  7:15 PM  Result Value Ref Range   HIV Screen 4th Generation wRfx Non Reactive Non Reactive    Comment: Performed at [facility], [place] [address], [place], [place] [address]  TSH     Status: None   Collection Time: 08/14/21  7:15 PM  Result Value Ref Range   TSH 1.204 0.350 - 4.500 uIU/mL    Comment: Performed by a 3rd Generation assay with a functional sensitivity of <=0.01 uIU/mL. Performed at [facility], [place] [address], [place], [place] [address]   Basic metabolic panel     Status: Abnormal   Collection Time: 08/15/21  3:34 AM  Result Value Ref Range   Sodium 135 135 - 145 mmol/L   Potassium 3.7 3.5 - 5.1 mmol/L   Chloride 103 98 - 111 mmol/L   CO2 26 22 - 32 mmol/L   Glucose, Bld 106 (H) 70 - 99 mg/dL    Comment: Glucose reference range applies only to samples taken after fasting for at least 8 hours.   BUN 9 8 - 23 mg/dL   Creatinine, Ser 0.86 0.44 - 1.00 mg/dL   Calcium 8.9 8.9 - 10.3 mg/dL   GFR, Estimated >  60 >60 mL/min    Comment: (NOTE) Calculated using the CKD-EPI Creatinine Equation (2021)    Anion gap 6 5 - 15    Comment: Performed at [facility], [place] [address] [place], [place] [address]  CBC     Status: None   Collection Time: 08/15/21  3:34 AM  Result Value Ref Range   WBC  5.0 4.0 - 10.5 K/uL   RBC 4.44 3.87 - 5.11 MIL/uL   Hemoglobin 13.7 12.0 - 15.0 g/dL   HCT 40.9 36.0 - 46.0 %   MCV 92.1 80.0 - 100.0 fL    Comment: REPEATED TO VERIFY DELTA CHECK NOTED    MCH 30.9 26.0 - 34.0 pg   MCHC 33.5 30.0 - 36.0 g/dL   RDW 12.7 11.5 - 15.5 %   Platelets 160 150 - 400 K/uL   nRBC 0.0 0.0 - 0.2 %    Comment: Performed at [facility], [place] [address], [place], [place] [address]  Basic metabolic panel     Status: Abnormal   Collection Time: 08/16/21  3:42 AM  Result Value Ref Range   Sodium 142 135 - 145 mmol/L    Comment: DELTA CHECK NOTED   Potassium 4.5 3.5 - 5.1 mmol/L    Comment: DELTA CHECK NOTED   Chloride 107 98 - 111 mmol/L   CO2 28 22 - 32 mmol/L   Glucose, Bld 120 (H) 70 - 99 mg/dL    Comment: Glucose reference range applies only to samples taken after fasting for at least 8 hours.   BUN 5 (L) 8 - 23 mg/dL   Creatinine, Ser 0.79 0.44 - 1.00 mg/dL   Calcium 9.5 8.9 - 10.3 mg/dL   GFR, Estimated >60 >60 mL/min    Comment: (NOTE) Calculated using the CKD-EPI Creatinine Equation (2021)    Anion gap 7 5 - 15    Comment: Performed at [facility], [place] [address], [place], [place] [address]  CBC     Status: None   Collection Time: 08/16/21  3:42 AM  Result Value Ref Range   WBC 6.1 4.0 - 10.5 K/uL   RBC 4.28 3.87 - 5.11 MIL/uL   Hemoglobin 13.0 12.0 - 15.0 g/dL   HCT 39.6 36.0 - 46.0 %   MCV 92.5 80.0 - 100.0 fL   MCH 30.4 26.0 - 34.0 pg   MCHC 32.8 30.0 - 36.0 g/dL   RDW 12.7 11.5 - 15.5 %   Platelets 164 150 - 400 K/uL   nRBC 0.0 0.0 - 0.2 %    Comment: Performed at [facility], [place] [address] [place], [place] [address]  Magnesium     Status: None   Collection Time: 08/16/21  3:42 AM  Result Value Ref Range   Magnesium 2.2 1.7 - 2.4 mg/dL    Comment: Performed at [facility] [place], [place] [address], [place], [place] [address]    Assessment/Plan: Dysphagia in  patient with metastatic adenocarcinoma non-small cell lung cancer.  normal barium swallow 07/31/2021. EGD 08/15/2021 benign-appearing esophageal stenosis which was biopsied pending, possible extrinsic compression.  Pending biopsies Discussed case with oncology, states no obvious extrinsic compression. Continue full liquid diet at this time.  Since there was some resistance through the stenosis, will proceed with repeat endoscopy with dilatation- time pending I thoroughly discussed the procedure with the patient (at bedside) to include nature of the procedure, alternatives, benefits, and risks (including but not limited to bleeding, infection,  perforation, anesthesia/cardiac pulmonary complications).  Patient verbalized understanding and gave verbal consent to proceed with EGD with dilatation.  If patient continues to have dysphagia post dilatation, will get MBS    Vladimir Crofts PA-C 08/16/2021, 8:12 AM  Contact #  [phone]

## 2021-08-16 NOTE — TOC Progression Note (Signed)
Transition of Care [facility]) - Progression Note    Patient Details  Name: Brittany Mccarty MRN: [id number] Date of Birth: [birth year]  [facility]) CM/SW Contact  Purcell Mouton, RN Phone Number: 08/16/2021, 2:08 PM  Clinical Narrative:    TOC reviewed chart. Will continue to follow for discharge needs.    Expected Discharge Plan: Home/Self Care Barriers to Discharge: No Barriers Identified  Expected Discharge Plan and Services Expected Discharge Plan: Home/Self Care       Living arrangements for the past 2 months: Single Family Home                                       Social Determinants of Health (SDOH) Interventions    Readmission Risk Interventions No flowsheet data found.

## 2021-08-16 NOTE — Assessment & Plan Note (Signed)
Apparent progression of disease, restarting Tagrisso and follow with Dr. Julien Nordmann.  Question whether she will start on another regimen, possibly standard chemotherapy

## 2021-08-16 NOTE — Progress Notes (Signed)
PROGRESS NOTE    Brittany Mccarty  HLK:[id number] DOB: [birth year] DOA: 08/14/2021 PCP: Pcp, No   Brief Narrative:  60 y.o. female with history of metastatic lung cancer stage IV on chemotherapy, PE on Eliquis, dysphagia and recent hospitalization from 8/27-8/29 for acute PE and dysphagia returning with progressive dysphagia.  Patient has dysphagia both to solid and liquid, GI team consulted.  Recently during her previous admission she underwent MRI brain which was negative, CTA chest which showed suspected radiation changes in the right hemithorax and a 2.3 cm calcified right peritracheal nodule.  Endoscopy attempted 9/13 but met with some resistance during the passage of the scope.  Case discussed with pulmonary but nothing much to offer at this time.  GI will attempt repeat endoscopy 9/15 with empiric balloon dilatation.   Assessment & Plan:   Active Problems:   Pulmonary embolism (HCC)   Metastatic cancer ([place])   Dysphagia   Dysphagia/odynophagia- Recent MBS unremarkable.  MRI brain and CT head are negative. - Followed by [facility].  EGD attempted 9/13 but met with some resistance during passage of the scope.  Case discussed with pulmonary and oncology, no further treatment offered for now.  We will repeat CT chest with contrast to reevaluate this area.  GI to reattempt endoscopy tomorrow with empiric balloon dilatation   Stage IV lung cancer with brain mets -Followed by Dr. Earlie Server outpatient.  On outpatient Tagrisso.  Dr. Julien Nordmann aware patient is admitted to the hospital   Wheeze/cough/shortness of breath -Improved.  Continue as needed bronchodilators   History of recurrent PE - Eliquis on hold   Elevated blood pressure -As needed hydralazine    DVT prophylaxis: Eliquis Code Status: Full code Family Communication: Husband at bedside  Ongoing evaluation for severe dysphagia.  Dispo: The patient is from: Home              Anticipated d/c is to: Home               Patient currently is not medically stable to d/c.   Difficult to place patient No         Subjective: Patient having extreme difficulty with any type of oral intake including solid and liquid.  She seems very frustrated with inability to eat.  Review of Systems Otherwise negative except as per HPI, including: General: Denies fever, chills, night sweats or unintended weight loss. Resp: Denies cough, wheezing, shortness of breath. Cardiac: Denies chest pain, palpitations, orthopnea, paroxysmal nocturnal dyspnea. GI: Denies abdominal pain, nausea, vomiting, diarrhea or constipation GU: Denies dysuria, frequency, hesitancy or incontinence MS: Denies muscle aches, joint pain or swelling Neuro: Denies headache, neurologic deficits (focal weakness, numbness, tingling), abnormal gait Psych: Denies anxiety, depression, SI/HI/AVH Skin: Denies new rashes or lesions ID: Denies sick contacts, exotic exposures, travel  Examination:  Constitutional: Not in acute distress Respiratory: Clear to auscultation bilaterally Cardiovascular: Normal sinus rhythm, no rubs Abdomen: Nontender nondistended good bowel sounds Musculoskeletal: No edema noted Skin: No rashes seen Neurologic: CN 2-12 grossly intact.  And nonfocal Psychiatric: Normal judgment and insight. Alert and oriented x 3. Normal mood.  Objective: Vitals:   08/15/21 2317 08/16/21 0405 08/16/21 0900 08/16/21 1200  BP: (!) 173/98 (!) 170/99 (!) 149/95 (!) 160/97  Pulse: 92 96 90 92  Resp: (!) 21 16  18   Temp: 98.6 F (37 C) 98.4 F (36.9 C) 98.6 F (37 C) 98.3 F (36.8 C)  TempSrc: Oral Oral Oral Oral  SpO2: 100% 100% 100% 100%  Weight:  Height:        Intake/Output Summary (Last 24 hours) at 08/16/2021 1344 Last data filed at 08/16/2021 0200 Gross per 24 hour  Intake 370 ml  Output --  Net 370 ml   Filed Weights   08/15/21 0406 08/15/21 1222  Weight: 78.6 kg 78.6 kg     Data Reviewed:   CBC: Recent Labs   Lab 08/14/21 1258 08/15/21 0334 08/16/21 0342  WBC 6.8 5.0 6.1  NEUTROABS 5.6  --   --   HGB 15.4* 13.7 13.0  HCT 49.5* 40.9 39.6  MCV 99.8 92.1 92.5  PLT 153 160 127   Basic Metabolic Panel: Recent Labs  Lab 08/14/21 1407 08/15/21 0334 08/16/21 0342  NA 136 135 142  K 4.2 3.7 4.5  CL 98 103 107  CO2 27 26 28   GLUCOSE 101* 106* 120*  BUN 11 9 5*  CREATININE 0.99 0.86 0.79  CALCIUM 9.7 8.9 9.5  MG  --   --  2.2   GFR: Estimated Creatinine Clearance: 76.5 mL/min (by C-G formula based on SCr of 0.79 mg/dL). Liver Function Tests: Recent Labs  Lab 08/14/21 1407  AST 26  ALT 16  ALKPHOS 98  BILITOT 1.0  PROT 7.1  ALBUMIN 3.9   Recent Labs  Lab 08/14/21 1408  LIPASE 30   No results for input(s): AMMONIA in the last 168 hours. Coagulation Profile: No results for input(s): INR, PROTIME in the last 168 hours. Cardiac Enzymes: No results for input(s): CKTOTAL, CKMB, CKMBINDEX, TROPONINI in the last 168 hours. BNP (last 3 results) No results for input(s): PROBNP in the last 8760 hours. HbA1C: No results for input(s): HGBA1C in the last 72 hours. CBG: No results for input(s): GLUCAP in the last 168 hours. Lipid Profile: No results for input(s): CHOL, HDL, LDLCALC, TRIG, CHOLHDL, LDLDIRECT in the last 72 hours. Thyroid Function Tests: Recent Labs    08/14/21 1915  TSH 1.204   Anemia Panel: No results for input(s): VITAMINB12, FOLATE, FERRITIN, TIBC, IRON, RETICCTPCT in the last 72 hours. Sepsis Labs: No results for input(s): PROCALCITON, LATICACIDVEN in the last 168 hours.  Recent Results (from the past 240 hour(s))  Resp Panel by RT-PCR (Flu A&B, Covid) Nasopharyngeal Swab     Status: None   Collection Time: 08/14/21  3:22 PM   Specimen: Nasopharyngeal Swab; Nasopharyngeal(NP) swabs in vial transport medium  Result Value Ref Range Status   SARS Coronavirus 2 by RT PCR NEGATIVE NEGATIVE Final    Comment: (NOTE) SARS-CoV-2 target nucleic acids are NOT  DETECTED.  The SARS-CoV-2 RNA is generally detectable in upper respiratory specimens during the acute phase of infection. The lowest concentration of SARS-CoV-2 viral copies this assay can detect is 138 copies/mL. A negative result does not preclude SARS-Cov-2 infection and should not be used as the sole basis for treatment or other patient management decisions. A negative result may occur with  improper specimen collection/handling, submission of specimen other than nasopharyngeal swab, presence of viral mutation(s) within the areas targeted by this assay, and inadequate number of viral copies(<138 copies/mL). A negative result must be combined with clinical observations, patient history, and epidemiological information. The expected result is Negative.  Fact Sheet for Patients:  EntrepreneurPulse.com.au  Fact Sheet for Healthcare Providers:  IncredibleEmployment.be  This test is no t yet approved or cleared by the [place] FDA and  has been authorized for detection and/or diagnosis of SARS-CoV-2 by FDA under an Emergency Use Authorization (EUA). This EUA will remain  in effect (meaning this test can be used) for the duration of the COVID-19 declaration under Section 564(b)(1) of the Act, 21 U.S.C.section 360bbb-3(b)(1), unless the authorization is terminated  or revoked sooner.       Influenza A by PCR NEGATIVE NEGATIVE Final   Influenza B by PCR NEGATIVE NEGATIVE Final    Comment: (NOTE) The Xpert Xpress SARS-CoV-2/FLU/RSV plus assay is intended as an aid in the diagnosis of influenza from Nasopharyngeal swab specimens and should not be used as a sole basis for treatment. Nasal washings and aspirates are unacceptable for Xpert Xpress SARS-CoV-2/FLU/RSV testing.  Fact Sheet for Patients: EntrepreneurPulse.com.au  Fact Sheet for Healthcare Providers: IncredibleEmployment.be  This test is not yet  approved or cleared by the [place] FDA and has been authorized for detection and/or diagnosis of SARS-CoV-2 by FDA under an Emergency Use Authorization (EUA). This EUA will remain in effect (meaning this test can be used) for the duration of the COVID-19 declaration under Section 564(b)(1) of the Act, 21 U.S.C. section 360bbb-3(b)(1), unless the authorization is terminated or revoked.  Performed at [facility], [place] [address] [place], [place] [address]          Radiology Studies: No results found.      Scheduled Meds:  fentaNYL  1 patch Transdermal Q72H   osimertinib mesylate  80 mg Oral Daily   pantoprazole (PROTONIX) IV  40 mg Intravenous Q24H   senna-docusate  1 tablet Oral QHS   Continuous Infusions:  dextrose 5 % and 0.9% NaCl 100 mL/hr at 08/15/21 2055   lactated ringers Stopped (08/15/21 1915)   promethazine (PHENERGAN) injection (IM or IVPB) 25 mg (08/16/21 0925)     LOS: 1 day   Time spent= 35 mins    Brittany Politte Arsenio Loader, MD [facility]  If 7PM-7AM, please contact night-coverage  08/16/2021, 1:44 PM

## 2021-08-16 NOTE — Assessment & Plan Note (Signed)
Possibly multifactorial, certainly her pulmonary infiltrate may be a contributor.  Consider also contribution of her chronic intermittent aspiration, LPR, GERD.  Increase your omeprazole to 20 mg twice a day.  Take this medication 1 hour around food. Please start Phenergan codeine cough syrup 5-10 cc every 6 hours as you needed for cough suppression. You can still use your Tessalon Perles up to every 6 hours if needed for cough suppression. Continue to practice swallowing precautions to avoid any aspiration especially in the evening when your symptoms are most bothersome.

## 2021-08-16 NOTE — Progress Notes (Signed)
Initial Nutrition Assessment  DOCUMENTATION CODES:   Severe malnutrition in context of acute illness/injury  INTERVENTION:  - monitor for ability to advance diet. - if unable to advance diet in 72 hours, will need to consider nutrition support.    NUTRITION DIAGNOSIS:   Severe Malnutrition related to acute illness, dysphagia as evidenced by percent weight loss, energy intake < or equal to 50% for > or equal to 5 days.  GOAL:   Patient will meet greater than or equal to 90% of their needs  MONITOR:   PO intake, Diet advancement, Labs, Weight trends, Other (Comment) (need for nutrition support)  REASON FOR ASSESSMENT:   Consult Assessment of nutrition requirement/status  ASSESSMENT:   60 y.o. female with medical history of stage 4 metastatic lung cancer on chemo, PE on Eliquis, dysphagia, and recent hospitalization from 8/27-8/29 for acute PE and dysphagia. She presented to the ED due to progressive dysphagia. GI consulted.  Patient in bed during RD visit with no family or visitors at bedside. She is on CLD and has tried taking a few bites/sips of [place] ice.   She reports worsening/progressive dysphagia that began ~6 weeks ago. She states that it has progressed to the point that nothing goes down and stays down, to include water and ice chips. She does not feel nauseated before things come back up. She does have a sensation of items being stuck in her throat. When solid food gets stuck she will take a few sips of a beverage and then gag until everything comes up.  She gives an example of 1 week ago ordering fettuccini alfredo at [employer]. She took a small bite of a noodle and chewed it very thoroughly but even that got stuck in her throat.   Every time something comes up it is foamy in texture.   Pending repeat EGD tomorrow.   Weight yesterday was 173 lb, weight on 8/8 was 178 lb (5 lb weight loss/2.8% body weight in 1 month), and weight on 6/13 was 186 lb (13 lb weight  loss/7% body weight loss in 3 months).    Labs reviewed; BUN: 5 mg/dl.  Medications reviewed; PRN zofran, 40 mg IV protonix/day, 1 tablet senokot/day.  IVF; D5-NS @ 100 ml/hr (408 kcal/24 hrs).     NUTRITION - FOCUSED PHYSICAL EXAM:  Completed; no muscle or fat depletions at this time.   Diet Order:   Diet Order             Diet NPO time specified  Diet effective midnight           Diet clear liquid Room service appropriate? Yes; Fluid consistency: Thin  Diet effective now                   EDUCATION NEEDS:   Not appropriate for education at this time  Skin:  Skin Assessment: Reviewed RN Assessment  Last BM:  PTA/unknown  Height:   Ht Readings from Last 1 Encounters:  08/15/21 5\' 5"  (1.651 m)    Weight:   Wt Readings from Last 1 Encounters:  08/15/21 78.6 kg     Estimated Nutritional Needs:  Kcal:  2000-2250 kcal Protein:  100-120 grams Fluid:  >/= 2.2 L/day      Jarome Matin, MS, RD, LDN, CNSC Inpatient Clinical Dietitian RD pager # available in [facility]  After hours/weekend pager # available in [facility]

## 2021-08-17 ENCOUNTER — Telehealth: Payer: Self-pay | Admitting: Medical Oncology

## 2021-08-17 ENCOUNTER — Encounter (HOSPITAL_COMMUNITY)
Admission: EM | Disposition: A | Discharge status: Home Health | Payer: Self-pay | Source: Home / Self Care | Attending: Internal Medicine

## 2021-08-17 ENCOUNTER — Other Ambulatory Visit: Payer: Self-pay | Admitting: Internal Medicine

## 2021-08-17 ENCOUNTER — Inpatient Hospital Stay (HOSPITAL_COMMUNITY): Payer: 59 | Admitting: Anesthesiology

## 2021-08-17 ENCOUNTER — Encounter (HOSPITAL_COMMUNITY): Payer: Self-pay | Admitting: Gastroenterology

## 2021-08-17 DIAGNOSIS — C787 Secondary malignant neoplasm of liver and intrahepatic bile duct: Secondary | ICD-10-CM | POA: Diagnosis not present

## 2021-08-17 DIAGNOSIS — E1310 Other specified diabetes mellitus with ketoacidosis without coma: Secondary | ICD-10-CM

## 2021-08-17 DIAGNOSIS — R1312 Dysphagia, oropharyngeal phase: Secondary | ICD-10-CM | POA: Diagnosis not present

## 2021-08-17 DIAGNOSIS — C3491 Malignant neoplasm of unspecified part of right bronchus or lung: Secondary | ICD-10-CM

## 2021-08-17 DIAGNOSIS — C7951 Secondary malignant neoplasm of bone: Secondary | ICD-10-CM

## 2021-08-17 DIAGNOSIS — I2699 Other pulmonary embolism without acute cor pulmonale: Secondary | ICD-10-CM | POA: Diagnosis not present

## 2021-08-17 DIAGNOSIS — C7931 Secondary malignant neoplasm of brain: Secondary | ICD-10-CM

## 2021-08-17 DIAGNOSIS — C3490 Malignant neoplasm of unspecified part of unspecified bronchus or lung: Secondary | ICD-10-CM | POA: Diagnosis not present

## 2021-08-17 DIAGNOSIS — C799 Secondary malignant neoplasm of unspecified site: Secondary | ICD-10-CM

## 2021-08-17 HISTORY — PX: ESOPHAGOGASTRODUODENOSCOPY (EGD) WITH PROPOFOL: SHX5813

## 2021-08-17 LAB — BASIC METABOLIC PANEL
Anion gap: 9 (ref 5–15)
BUN: <5 mg/dL — ABNORMAL LOW (ref 8–23)
CO2: 27 mmol/L (ref 22–32)
Calcium: 9 mg/dL (ref 8.9–10.3)
Chloride: 103 mmol/L (ref 98–111)
Creatinine, Ser: 0.92 mg/dL (ref 0.44–1.00)
GFR, Estimated: >60 mL/min (ref >60)
Glucose, Bld: 99 mg/dL (ref 70–99)
Potassium: 3.5 mmol/L (ref 3.5–5.1)
Sodium: 139 mmol/L (ref 135–145)

## 2021-08-17 LAB — CBC
HCT: 43.8 % (ref 36.0–46.0)
Hemoglobin: 14.5 g/dL (ref 12.0–15.0)
MCH: 31 pg (ref 26.0–34.0)
MCHC: 33.1 g/dL (ref 30.0–36.0)
MCV: 93.6 fL (ref 80.0–100.0)
Platelets: 160 10*3/uL (ref 150–400)
RBC: 4.68 MIL/uL (ref 3.87–5.11)
RDW: 12.9 % (ref 11.5–15.5)
WBC: 6.1 10*3/uL (ref 4.0–10.5)
nRBC: 0 % (ref 0.0–0.2)

## 2021-08-17 LAB — PROCALCITONIN: Procalcitonin: <0.1 ng/mL

## 2021-08-17 LAB — MAGNESIUM: Magnesium: 1.9 mg/dL (ref 1.7–2.4)

## 2021-08-17 LAB — GLUCOSE, CAPILLARY: Glucose-Capillary: 102 mg/dL — ABNORMAL HIGH (ref 70–99)

## 2021-08-17 LAB — BRAIN NATRIURETIC PEPTIDE: B Natriuretic Peptide: 83.1 pg/mL (ref 0.0–100.0)

## 2021-08-17 SURGERY — ESOPHAGOGASTRODUODENOSCOPY (EGD) WITH PROPOFOL
Anesthesia: Monitor Anesthesia Care

## 2021-08-17 MED ORDER — PANTOPRAZOLE SODIUM 40 MG IV SOLR
40.0000 mg | Freq: Two times a day (BID) | INTRAVENOUS | Status: DC
  Administered 2021-08-17 – 2021-08-26 (×19): 40 mg via INTRAVENOUS
  Filled 2021-08-17 (×20): qty 40

## 2021-08-17 MED ORDER — PROPOFOL 500 MG/50ML IV EMUL
INTRAVENOUS | Status: DC | PRN
  Administered 2021-08-17: 125 ug/kg/min via INTRAVENOUS
  Administered 2021-08-17: 30 mg via INTRAVENOUS

## 2021-08-17 MED ORDER — LIDOCAINE 2% (20 MG/ML) 5 ML SYRINGE
INTRAMUSCULAR | Status: DC | PRN
  Administered 2021-08-17: 40 mg via INTRAVENOUS

## 2021-08-17 MED ORDER — FENTANYL CITRATE (PF) 100 MCG/2ML IJ SOLN
INTRAMUSCULAR | Status: AC
  Filled 2021-08-17: qty 2

## 2021-08-17 MED ORDER — SODIUM CHLORIDE 0.9 % IV SOLN: INTRAVENOUS | Status: DC

## 2021-08-17 MED ORDER — SUCRALFATE 1 GM/10ML PO SUSP
1.0000 g | Freq: Three times a day (TID) | ORAL | Status: AC
  Administered 2021-08-17: 1 g via ORAL
  Filled 2021-08-17 (×3): qty 10

## 2021-08-17 MED ORDER — HYDROMORPHONE HCL 1 MG/ML IJ SOLN
1.0000 mg | INTRAMUSCULAR | Status: DC | PRN
  Administered 2021-08-17 – 2021-08-18 (×6): 1 mg via INTRAVENOUS
  Filled 2021-08-17 (×6): qty 1

## 2021-08-17 MED ORDER — FENTANYL CITRATE (PF) 100 MCG/2ML IJ SOLN
INTRAMUSCULAR | Status: DC | PRN
  Administered 2021-08-17: 25 ug via INTRAVENOUS
  Administered 2021-08-17: 50 ug via INTRAVENOUS
  Administered 2021-08-17: 25 ug via INTRAVENOUS

## 2021-08-17 MED ORDER — PROPOFOL 500 MG/50ML IV EMUL
INTRAVENOUS | Status: AC
  Filled 2021-08-17: qty 50

## 2021-08-17 SURGICAL SUPPLY — 15 items

## 2021-08-17 NOTE — Progress Notes (Addendum)
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  Balloon dilation performed with 12 mm, 13.5 mm, 15 mm balloon today. Depending upon symptomatic improvement, may require further EGD with balloon dilation with larger balloons. Should be able to tolerate clear liquid diet today. PPI twice daily. Carafate 4 times a day for now.

## 2021-08-17 NOTE — Anesthesia Preprocedure Evaluation (Signed)
Anesthesia Evaluation  Patient identified by MRN, date of birth, ID band Patient awake    Reviewed: Allergy & Precautions, NPO status , Patient's Chart, lab work & pertinent test results  History of Anesthesia Complications Negative for: history of anesthetic complications  Airway Mallampati: II  TM Distance: >3 FB Neck ROM: Full    Dental  (+) Dental Advisory Given, Teeth Intact   Pulmonary shortness of breath and with exertion, PE NSCL CA   Pulmonary exam normal breath sounds clear to auscultation + decreased breath sounds      Cardiovascular negative cardio ROS Normal cardiovascular exam Rhythm:Regular Rate:Normal     Neuro/Psych  Headaches, negative psych ROS   GI/Hepatic Neg liver ROS, GERD  Medicated,Dysphagia Esophageal narrowing   Endo/Other  negative endocrine ROS  Renal/GU negative Renal ROS  negative genitourinary   Musculoskeletal negative musculoskeletal ROS (+)   Abdominal   Peds  Hematology  (+) REFUSES BLOOD PRODUCTS, Lab Results      Component                Value               Date                      WBC                      5.0                 08/15/2021                HGB                      13.7                08/15/2021                HCT                      40.9                08/15/2021                MCV                      92.1                08/15/2021                PLT                      160                 08/15/2021              Anesthesia Other Findings   Reproductive/Obstetrics negative OB ROS                             Anesthesia Physical  Anesthesia Plan  ASA: 3  Anesthesia Plan: MAC   Post-op Pain Management:    Induction: Intravenous  PONV Risk Score and Plan: 2 and Propofol infusion and Treatment may vary due to age or medical condition  Airway Management Planned: Nasal Cannula  Additional Equipment: None  Intra-op Plan:    Post-operative Plan:   Informed Consent: I have reviewed the patients History and  Physical, chart, labs and discussed the procedure including the risks, benefits and alternatives for the proposed anesthesia with the patient or authorized representative who has indicated his/her understanding and acceptance.     Dental advisory given  Plan Discussed with: CRNA and Anesthesiologist  Anesthesia Plan Comments: (Refuses all blood products even if needed to save life)        Anesthesia Quick Evaluation

## 2021-08-17 NOTE — Consult Note (Signed)
Radiation Oncology         [phone] ________________________________  Name: Brittany Mccarty        MRN: [id number]  Date of Service:08/17/21  DOB: Apr 27, 1961  CC:Pcp, No  No ref. provider found     REFERRING PHYSICIAN: No ref. provider found   DIAGNOSIS: The encounter diagnosis was Dysphagia, unspecified type.   HISTORY OF PRESENT ILLNESS: Brittany Mccarty is a 60 y.o. female seen at the request of Dr. 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  The patient is well-known to our clinic, and most recently was treated with stereotactic radiosurgery to the brain in June 2022 with single dosing to the left frontal and left right parietal lobe targets.  She has been on Tagrisso due to her EGFR mutation.  Up until recently this was keeping her disease quite stable.  A CT of the chest with contrast on 07/19/2021 showed interval progression of groundglass opacity and interlobular septal thickening in the high right upper lobe posteriorly it was felt to be possibly inflammatory versus risk of lymphangitic tumor.  She has been noticing pain with swallowing for the last 6 weeks, when she was seen in the clinic on 07/10/2021 she weighed 178, I cannot tell that she has been weighed in the inpatient setting however.  She has not been able to swallow regular food for at least 3 weeks, and is now unable to even take pain medicine with small sips of water.  She presented to the hospital with progressive dysphagia and back pain, and has undergone EGD twice showing extrinsic compression, earlier dilation was attempted today with minimal improvement.  Her current pain regimen is fentanyl patch and IV Dilaudid for as needed relief.  She did have a repeat CT scan yesterday of the chest with contrast and again ill-defined changes in the right lower lobe extending to the right hilum  measuring up to 5.7 cm was noted that was felt to be similar to prior imaging prominence of right side hilar soft tissue was also seen and new right middle lobe airspace disease and right upper lobe airspace disease extending to the right hilum containing air bronchograms was noted increasing groundglass and airspace disease throughout the right lung apex was also seen it is felt that her symptoms are likely the result of extrinsic compression of her posterior right lung tumor.  Were asked to consider palliative radiotherapy to alleviate her symptoms   PREVIOUS RADIATION THERAPY: Yes   05/24/2021 through 05/24/2021  SRS Treatment: Site Technique Total Dose (Gy) Dose per Fx (Gy) Completed Fx Beam Energies  Brain: PTV_1 Lt Frontal 66m 3D 20/20 20 1/1 6XFFF  Brain: PTV_2 Rt Parietal 170mIMRT 20/20 20 1/1 6XFFF    PAST MEDICAL HISTORY:  Past Medical History:  Diagnosis Date   NSCL CA DX'D 04/07/20       PAST SURGICAL HISTORY: Past Surgical History:  Procedure Laterality Date   BIOPSY  08/15/2021   Procedure: BIOPSY;  Surgeon: KaRonnette JuniperMD;  Location: [facility];  Service: Gastroenterology;;   ESOPHAGOGASTRODUODENOSCOPY (EGD) WITH PROPOFOL N/A 08/15/2021   Procedure: ESOPHAGOGASTRODUODENOSCOPY (EGD) WITH PROPOFOL;  Surgeon: KaRonnette JuniperMD;  Location: [facility];  Service: Gastroenterology;  Laterality: N/A;   IR REMOVAL OF PLURAL CATH W/CUFF  04/24/2020     FAMILY HISTORY: History reviewed. No pertinent family history.   SOCIAL HISTORY:  reports that she  has never smoked. She has never used smokeless tobacco. She reports that she does not currently use alcohol. She reports that she does not use drugs.  The patient is married and lives in [place].   ALLERGIES: Other and Cefdinir   MEDICATIONS:  Current Facility-Administered Medications  Medication Dose Route Frequency Provider Last Rate Last Admin   acetaminophen (TYLENOL) tablet 650 mg  650 mg Oral Q6H PRN [name], MD        benzonatate (TESSALON) capsule 100 mg  100 mg Oral TID PRN [name], MD   100 mg at 08/14/21 1726   cyclobenzaprine (FLEXERIL) tablet 10 mg  10 mg Oral BID PRN [name], MD       dextrose 5 %-0.9 % sodium chloride infusion   Intravenous Continuous [name], MD 100 mL/hr at 08/17/21 2015 New Bag at 08/17/21 2015   fentaNYL (DURAGESIC) 50 MCG/HR 1 patch  1 patch Transdermal Q72H [name], MD   1 patch at 08/17/21 2036   hydrALAZINE (APRESOLINE) injection 10 mg  10 mg Intravenous Q4H PRN [name], MD       HYDROmorphone (DILAUDID) injection 1 mg  1 mg Intravenous Q2H PRN Amin, Jeanella Flattery, MD   1 mg at 08/17/21 2036   ipratropium-albuterol (DUONEB) 0.5-2.5 (3) MG/3ML nebulizer solution 3 mL  3 mL Nebulization Q6H PRN [name], MD       ipratropium-albuterol (DUONEB) 0.5-2.5 (3) MG/3ML nebulizer solution 3 mL  3 mL Nebulization BID [name], MD   3 mL at 08/17/21 1926   lactated ringers infusion   Intravenous Continuous [name], MD 0 mL/hr at 08/15/21 1915 Restarted at 08/17/21 1108   ondansetron (ZOFRAN) tablet 4 mg  4 mg Oral Q6H PRN [name], MD       Or   ondansetron [facility]) injection 4 mg  4 mg Intravenous Q6H PRN [name], MD   4 mg at 08/17/21 1222   osimertinib mesylate (TAGRISSO) tablet 80 mg  80 mg Oral Daily [name], MD       oxyCODONE (Oxy IR/ROXICODONE) immediate release tablet 5 mg  5 mg Oral TID BM PRN [name], MD       pantoprazole (PROTONIX) injection 40 mg  40 mg Intravenous Q12H [name], MD       promethazine (PHENERGAN) 25 mg in sodium chloride 0.9 % 50 mL IVPB  25 mg Intravenous Q6H PRN [name], MD 200 mL/hr at 08/17/21 1551 25 mg at 08/17/21 1551   promethazine-codeine (PHENERGAN with CODEINE) 6.25-10 MG/5ML syrup 5-10 mL  5-10 mL Oral Q6H PRN [name], MD   10 mL at 08/15/21 5809   senna-docusate (Senokot-S) tablet 1 tablet  1 tablet Oral QHS [name], MD       sucralfate (CARAFATE) 1 GM/10ML suspension 1 g  1 g Oral  TID WC & HS [name], MD   1 g at 08/17/21 1222   traZODone (DESYREL) tablet 50 mg  50 mg Oral QHS PRN [name], MD         REVIEW OF SYSTEMS: On review of systems, the patient reports that she has been experiencing significant upper back and swallowing discomfort for go upwards of 6 weeks.  She states that in the last week and a half it is gotten to the point of being unbearable where she cannot comfortably even take in liquids.  No headaches blurred vision double vision fevers or chills are verbalized.  She does note some shortness of  breath especially with exertion.  No other complaints are noted.     PHYSICAL EXAM:  Wt Readings from Last 3 Encounters:  08/15/21 173 lb 4.5 oz (78.6 kg)  08/11/21 173 lb (78.5 kg)  07/30/21 178 lb (80.7 kg)   Temp Readings from Last 3 Encounters:  08/17/21 97.9 F (36.6 C) (Oral)  08/11/21 98.1 F (36.7 C) (Oral)  07/31/21 98.1 F (36.7 C) (Oral)   BP Readings from Last 3 Encounters:  08/17/21 (!) 156/97  08/11/21 124/68  07/31/21 (!) 153/101   Pulse Readings from Last 3 Encounters:  08/17/21 (!) 103  08/11/21 84  07/31/21 80   Pain Assessment Pain Score: 7 /10  Unable to assess given encounter type.   ECOG = 1  0 - Asymptomatic (Fully active, able to carry on all predisease activities without restriction)  1 - Symptomatic but completely ambulatory (Restricted in physically strenuous activity but ambulatory and able to carry out work of a light or sedentary nature. For example, light housework, office work)  2 - Symptomatic, <50% in bed during the day (Ambulatory and capable of all self care but unable to carry out any work activities. Up and about more than 50% of waking hours)  3 - Symptomatic, >50% in bed, but not bedbound (Capable of only limited self-care, confined to bed or chair 50% or more of waking hours)  4 - Bedbound (Completely disabled. Cannot carry on any self-care. Totally confined to bed or chair)  5 -  Death   Brittany Mccarty MM, Creech RH, Tormey DC, et al. [phone]). "Toxicity and response criteria of the [facility]". [place] Oncol. 5 (6): 649-55    LABORATORY DATA:  Lab Results  Component Value Date   WBC 6.1 08/17/2021   HGB 14.5 08/17/2021   HCT 43.8 08/17/2021   MCV 93.6 08/17/2021   PLT 160 08/17/2021   Lab Results  Component Value Date   NA 139 08/17/2021   K 3.5 08/17/2021   CL 103 08/17/2021   CO2 27 08/17/2021   Lab Results  Component Value Date   ALT 16 08/14/2021   AST 26 08/14/2021   ALKPHOS 98 08/14/2021   BILITOT 1.0 08/14/2021      RADIOGRAPHY: DG Chest 2 View  Result Date: 08/14/2021 CLINICAL DATA:  Cough, dysphagia, shortness of breath EXAM: CHEST - 2 VIEW COMPARISON:  07/29/2021 FINDINGS: The heart size and mediastinal contours are within normal limits. Unchanged post treatment appearance of the right chest with volume loss of the right hemithorax and a small right pleural effusion. No new airspace opacity. The visualized skeletal structures are unremarkable. IMPRESSION: Unchanged post treatment appearance of the right chest with volume loss of the right hemithorax and a small right pleural effusion. No new airspace opacity. Electronically Signed   By: Eddie Candle M.D.   On: 08/14/2021 12:52   DG Chest 2 View  Result Date: 07/29/2021 CLINICAL DATA:  Chest pain, history of lung cancer EXAM: CHEST - 2 VIEW COMPARISON:  CT chest dated 07/19/2021 FINDINGS: Postsurgical changes with volume loss in the right hemithorax. Additional mild hazy ground-glass opacity, better evaluated on CT, favoring radiation changes and/or infection. Left lung is clear. Small right pleural effusion. No pneumothorax. The heart is normal in size IMPRESSION: Postsurgical changes with volume loss in the right hemithorax. Superimposed ground-glass opacity in the right lung is better evaluated on recent CT, possibly reflecting radiation changes and/or infection.  Electronically Signed   By: Henderson Newcomer.D.  On: 07/29/2021 22:07   CT CHEST W CONTRAST  Result Date: 08/16/2021 CLINICAL DATA:  Cough, persistent lung mass.  Dysphagia.  A EXAM: CT CHEST WITH CONTRAST TECHNIQUE: Multidetector CT imaging of the chest was performed during intravenous contrast administration. CONTRAST:  67m OMNIPAQUE IOHEXOL 350 MG/ML SOLN COMPARISON:  CT angiogram chest 07/30/2021. FINDINGS: Cardiovascular: Heart size is within normal limits. The aorta is normal in size. There is no pericardial effusion. Right lower lobe pulmonary arteries diminutive, unchanged. Mediastinum/Nodes: The visualized thyroid gland is within normal limits. 2.3 cm partially calcified right paratracheal lymph node appears unchanged from the prior examination. The esophagus is nondilated. Lungs/Pleura: Again seen is ill-defined right lower lobe parenchymal opacity/mass extending to the right hilum measuring 5.7 x 4.0 cm image 2/86 which appears similar to the prior examination. There is cut off of rhonchi at this level, unchanged. There is prominence of right hilar soft tissue, ill-defined, unchanged from the prior examination. There is new right middle lobe airspace disease and right upper lobe airspace disease extending to the right hilum containing air bronchograms. Additionally, there is increasing ground-glass and airspace disease throughout the right lung apex. There continues to be volume loss on the right. There is a small right pleural effusion which is new from the prior examination. There is likely mild diffuse pleural thickening there is mild diffuse pleural thickening on the right, unchanged. Scattered parenchymal calcifications are seen in the right lung. Focal ground-glass/nodular density in the left upper lobe measuring 7 mm has minimally increased when compared to the prior study. There are scattered calcified granulomas throughout the left lung. Left lung is otherwise clear. Upper Abdomen: There  are calcified granulomas in the spleen. Visualized upper abdomen is otherwise within normal limits. Musculoskeletal: Degenerative changes affect the spine. IMPRESSION: 1. Dense parenchymal opacity in the right lower lobe extending to the right hilum appears unchanged from the prior examination (07/30/2021. Findings may related to post radiation changes, but superimposed infection or neoplasm not excluded. 2. There is new airspace disease in the right middle lobe and right upper lobe worrisome for infection. 3. There is a new small right pleural effusion. 4. Right pleural thickening is stable. 5. Ground-glass/nodular density in the left upper lobe measuring 7 mm has slightly increased in size. Follow-up non-contrast CT recommended at 3-6 months to confirm persistence. If unchanged, and solid component remains <6 mm, annual CT is recommended until 5 years of stability has been established. If persistent these nodules should be considered highly suspicious if the solid component of the nodule is 6 mm or greater in size and enlarging. This recommendation follows the consensus statement: Guidelines for Management of Incidental Pulmonary Nodules Detected on CT Images: From the Fleischner Society 2017; Radiology 2017; 284:228-243. Electronically Signed   By: ARonney AstersM.D.   On: 08/16/2021 19:03   CT Chest W Contrast  Result Date: 07/19/2021 CLINICAL DATA:  Non-small-cell lung cancer.  Restaging. EXAM: CT CHEST, ABDOMEN, AND PELVIS WITH CONTRAST TECHNIQUE: Multidetector CT imaging of the chest, abdomen and pelvis was performed following the standard protocol during bolus administration of intravenous contrast. CONTRAST:  843mOMNIPAQUE IOHEXOL 350 MG/ML SOLN COMPARISON:  05/16/2021 FINDINGS: CT CHEST FINDINGS Cardiovascular: The heart size is normal. No substantial pericardial effusion. No thoracic aortic aneurysm. Mediastinum/Nodes: 2.4 x 2.4 cm calcified right paratracheal node stable since prior (2.6 x 2.5 cm).  No new mediastinal lymphadenopathy. Calcified nodal tissue in the right hilum is stable. Left hilum unremarkable. The esophagus has normal imaging features. There  is no axillary lymphadenopathy. Lungs/Pleura: Scarring in the extreme right lung apex is similar to prior although there is progressive ground-glass opacity in interlobular septal thickening in the high right upper lobe posteriorly (image 47/6). 6 mm perifissural nodule in the right lung (74/6) was present previously and is similar in size although appears more confluent today. Retrocardiac right lower lobe consolidative opacity is similar to prior although the bronchiectatic airways in this region seen on the previous study have become impacted in the interval. Similar bandlike ground-glass opacity noted peripheral left upper lobe. Several tiny 2-3 mm pulmonary nodules identified bilaterally are unchanged since prior. No substantial pleural effusion. Musculoskeletal: No worrisome lytic or sclerotic osseous abnormality. CT ABDOMEN PELVIS FINDINGS Hepatobiliary: No suspicious focal abnormality within the liver parenchyma. There is no evidence for gallstones, gallbladder wall thickening, or pericholecystic fluid. No intrahepatic or extrahepatic biliary dilation. Pancreas: No focal mass lesion. No dilatation of the main duct. No intraparenchymal cyst. No peripancreatic edema. Spleen: Is stable in the interval. Adrenals/Urinary Tract: No adrenal nodule or mass. Cortical scarring noted lower pole left kidney. Right kidney unremarkable No evidence for hydroureter. The urinary bladder appears normal for the degree of distention. Stomach/Bowel: Stomach is unremarkable. No gastric wall thickening. No evidence of outlet obstruction. Duodenum is normally positioned as is the ligament of Treitz. 4-5 cm length of jejunal intussusception identified on 57/2 and has nearly resolved by renal delay imaging 3 minutes later, consistent with transient process. Ileal loops  unremarkable. The terminal ileum is normal. The appendix is not well visualized, but there is no edema or inflammation in the region of the cecum. No gross colonic mass. No colonic wall thickening. Vascular/Lymphatic: No abdominal aortic aneurysm. No abdominal aortic atherosclerotic calcification. There is no gastrohepatic or hepatoduodenal ligament lymphadenopathy. No retroperitoneal or mesenteric lymphadenopathy. No pelvic sidewall lymphadenopathy. Reproductive: Unremarkable. Other: No intraperitoneal free fluid. Musculoskeletal: No worrisome lytic or sclerotic osseous abnormality. IMPRESSION: 1. Interval progression of ground-glass opacity and interlobular septal thickening in the high right upper lobe posteriorly. This is nonspecific and may be related to an infectious/inflammatory etiology. As lymphangitic tumor cannot be excluded, close attention recommended. 2. Otherwise stable exam. No other new or progressive findings in the chest, abdomen, or pelvis. 3. Stable appearance of calcified right paratracheal node. Tiny pulmonary nodules remain unchanged. 4. 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 Attention on follow-up recommended. Electronically Signed   By: Misty Stanley M.D.   On: 07/19/2021 12:19   CT Angio Chest PE W and/or Wo Contrast  Addendum Date: 07/30/2021   ADDENDUM REPORT: 07/30/2021 04:24 ADDENDUM: Upon re-review of CTA chest from 0023 hours today, the following findings are noted: Tiny low-density filling defect in a subsegmental, distal branch to the left lower lobe lateral basal segment seen on series 5, images 215 through 225. This appears primarily nonocclusive, and no other convincing pulmonary artery filling defect is identified. This was discussed by telephone with PA MIA MCDONALD on 07/30/2021 at 04:20. Electronically Signed   By: Genevie Ann M.D.   On: 07/30/2021 04:24   Result Date: 07/30/2021 CLINICAL DATA:  Chest pain EXAM: CT ANGIOGRAPHY CHEST WITH CONTRAST TECHNIQUE: Multidetector CT imaging of the chest was performed using the standard protocol during bolus administration of intravenous contrast. Multiplanar CT image reconstructions and MIPs were obtained to evaluate the vascular anatomy. CONTRAST:  32m OMNIPAQUE IOHEXOL 350 MG/ML  SOLN COMPARISON:  CT chest dated 07/19/2021 FINDINGS: Cardiovascular: Satisfactory opacification of the bilateral pulmonary arteries to the segmental level. Right lower lobe pulmonary arteries are diminutive. No evidence of pulmonary embolism. Although not tailored for evaluation of the thoracic aorta, there is no evidence thoracic aortic aneurysm or dissection. Heart is top-normal in size.  No pericardial effusion. Mediastinum/Nodes: No suspicious mediastinal lymphadenopathy. 2.3 cm calcified right paratracheal node (series 4/image 87), unchanged. Visualized thyroid is unremarkable. Lungs/Pleura: Bandlike opacity with volume loss in the medial right lower lobe (series 6/image 102), favoring radiation changes. Patchy/ground-glass opacities in the right lung, including in the posterior right upper lobe (series 6/image 87), with associated interlobular septal thickening. This appearance favors radiation changes, although infection is also possible. Stable nodular scarring in the anterior left upper lobe (series 6/image 61). Evaluation of the lung parenchyma is constrained by respiratory motion. Within that constraint, there are no new/suspicious pulmonary nodules. Trace right pleural effusion.  No pneumothorax. Upper Abdomen: Visualized upper abdomen is grossly unremarkable. Musculoskeletal: Mild degenerative changes of the visualized thoracolumbar spine. Review of the MIP images confirms the above findings. IMPRESSION: No evidence of pulmonary embolism. Suspected radiation changes in the right hemithorax.  Superimposed infection/pneumonia is possible, but is not favored. No interval change from recent CT. Electronically Signed: By: Julian Hy M.D. On: 07/30/2021 00:48   MR BRAIN W WO CONTRAST  Result Date: 07/30/2021 CLINICAL DATA:  60 year old female with non-small cell lung cancer. Suspected brain metastases with waxing and waning treatment effect on prior MRIs. Status post SRS to these 2 lesions in June. Restaging. EXAM: MRI HEAD WITHOUT AND WITH CONTRAST TECHNIQUE: Multiplanar, multiecho pulse sequences of the brain and surrounding structures were obtained without and with intravenous contrast. CONTRAST:  25m GADAVIST GADOBUTROL 1 MMOL/ML IV SOLN COMPARISON:  Brain MRI 05/12/2021 and earlier. FINDINGS: Brain: Petechial enhancement at the right perirolandic cortex on series 16, image 139 is stable since June. Trace hemosiderin and minimal cortical T2 and FLAIR hyperintensity with no regional edema or mass effect. Previously seen subtle left frontal lobe white matter enhancement just superior to the left lateral ventricle does not persist. No new abnormal enhancement identified.  No dural thickening. Widely scattered subcortical and also patchy, confluent periventricular white matter T2 and FLAIR hyperintensity in the brain appears stable. No progression or areas suspicious for vasogenic edema today. No superimposed restricted diffusion to suggest acute infarction. No midline shift, mass effect, ventriculomegaly, extra-axial collection or acute intracranial hemorrhage. Cervicomedullary junction and pituitary are within normal limits. No other chronic cerebral blood products identified. Vascular: Major intracranial vascular flow voids are stable. Mild generalized intracranial artery tortuosity. The major dural venous sinuses are enhancing and appear to be patent. Skull and upper cervical spine: Stable, negative visible cervical spine. Visualized bone marrow signal is within normal limits. Sinuses/Orbits:  Stable, negative. Other: Stable mild mastoid effusions. Negative visible orbits and scalp. IMPRESSION: 1. Satisfactory post SRS appearance of the brain. Stable subtle right superior perirolandic cortical enhancement with mild associated hemosiderin. Resolved small focus of left corona radiata white matter enhancement. No new metastatic disease identified. 2. Advanced nonspecific white matter disease. No new intracranial abnormality. Electronically Signed   By: HGenevie AnnM.D.   On: 07/30/2021 11:15   MR THORACIC SPINE W WO CONTRAST  Result Date: 08/11/2021 CLINICAL DATA:  Back pain with cancer suspected EXAM: MRI THORACIC AND LUMBAR SPINE WITHOUT AND WITH CONTRAST TECHNIQUE: Multiplanar and multiecho pulse sequences of the thoracic and lumbar spine were obtained without and  with intravenous contrast. CONTRAST:  44m GADAVIST GADOBUTROL 1 MMOL/ML IV SOLN COMPARISON:  None. FINDINGS: MRI THORACIC SPINE FINDINGS Alignment:  Slight C7-T1 to T2-3 anterolisthesis Vertebrae: No fracture, evidence of discitis, or bone lesion. Cord:  Normal signal and morphology. Paraspinal and other soft tissues: Post treatment changes in the right lung and mediastinum with volume loss. There has been recent staging CT. Small gallstones. No perispinal mass or inflammation. Disc levels: Small disc protrusions at T2-3 and T7-8, without neural compression. MRI LUMBAR SPINE FINDINGS Segmentation:  5 lumbar type vertebrae Alignment:  Scoliosis and L5-S1 anterolisthesis. Vertebrae: Chronic right pars defect at L5. No acute fracture, discitis, or aggressive bone lesion Conus medullaris: Extends to the L1 level and appears normal. Paraspinal and other soft tissues: Negative for perispinal mass or inflammation. Partially covered fibroids. Disc levels: T12- L1: Small upward pointing disc protrusion L1-L2: Unremarkable. L2-L3: Disc narrowing and rightward bulging.  Negative facets L3-L4: Disc narrowing and bulging eccentric to the left. Asymmetric left  facet spurring. L4-L5: Disc narrowing and bulging. Degenerative facet spurring on both sides. L5-S1:Chronic right L5 pars defect with anterolisthesis. The disc is mildly narrowed with right sided fissuring. Asymmetric right facet spurring with moderate right foraminal narrowing. IMPRESSION: 1. No evidence of metastatic disease to the thoracic or lumbar spine. 2. Lumbar scoliosis and degeneration with L5 chronic right pars defect and L5-S1 anterolisthesis. 3. L5-S1 moderate right foraminal stenosis. Electronically Signed   By: JJorje GuildM.D.   On: 08/11/2021 20:11   MR Lumbar Spine W Wo Contrast  Result Date: 08/11/2021 CLINICAL DATA:  Back pain with cancer suspected EXAM: MRI THORACIC AND LUMBAR SPINE WITHOUT AND WITH CONTRAST TECHNIQUE: Multiplanar and multiecho pulse sequences of the thoracic and lumbar spine were obtained without and with intravenous contrast. CONTRAST:  854mGADAVIST GADOBUTROL 1 MMOL/ML IV SOLN COMPARISON:  None. FINDINGS: MRI THORACIC SPINE FINDINGS Alignment:  Slight C7-T1 to T2-3 anterolisthesis Vertebrae: No fracture, evidence of discitis, or bone lesion. Cord:  Normal signal and morphology. Paraspinal and other soft tissues: Post treatment changes in the right lung and mediastinum with volume loss. There has been recent staging CT. Small gallstones. No perispinal mass or inflammation. Disc levels: Small disc protrusions at T2-3 and T7-8, without neural compression. MRI LUMBAR SPINE FINDINGS Segmentation:  5 lumbar type vertebrae Alignment:  Scoliosis and L5-S1 anterolisthesis. Vertebrae: Chronic right pars defect at L5. No acute fracture, discitis, or aggressive bone lesion Conus medullaris: Extends to the L1 level and appears normal. Paraspinal and other soft tissues: Negative for perispinal mass or inflammation. Partially covered fibroids. Disc levels: T12- L1: Small upward pointing disc protrusion L1-L2: Unremarkable. L2-L3: Disc narrowing and rightward bulging.  Negative facets  L3-L4: Disc narrowing and bulging eccentric to the left. Asymmetric left facet spurring. L4-L5: Disc narrowing and bulging. Degenerative facet spurring on both sides. L5-S1:Chronic right L5 pars defect with anterolisthesis. The disc is mildly narrowed with right sided fissuring. Asymmetric right facet spurring with moderate right foraminal narrowing. IMPRESSION: 1. No evidence of metastatic disease to the thoracic or lumbar spine. 2. Lumbar scoliosis and degeneration with L5 chronic right pars defect and L5-S1 anterolisthesis. 3. L5-S1 moderate right foraminal stenosis. Electronically Signed   By: JoJorje Guild.D.   On: 08/11/2021 20:11   CT Abdomen Pelvis W Contrast  Result Date: 07/19/2021 CLINICAL DATA:  Non-small-cell lung cancer.  Restaging. EXAM: CT CHEST, ABDOMEN, AND PELVIS WITH CONTRAST TECHNIQUE: Multidetector CT imaging of the chest, abdomen and pelvis was performed following the standard  protocol during bolus administration of intravenous contrast. CONTRAST:  29m OMNIPAQUE IOHEXOL 350 MG/ML SOLN COMPARISON:  05/16/2021 FINDINGS: CT CHEST FINDINGS Cardiovascular: The heart size is normal. No substantial pericardial effusion. No thoracic aortic aneurysm. Mediastinum/Nodes: 2.4 x 2.4 cm calcified right paratracheal node stable since prior (2.6 x 2.5 cm). No new mediastinal lymphadenopathy. Calcified nodal tissue in the right hilum is stable. Left hilum unremarkable. The esophagus has normal imaging features. There is no axillary lymphadenopathy. Lungs/Pleura: Scarring in the extreme right lung apex is similar to prior although there is progressive ground-glass opacity in interlobular septal thickening in the high right upper lobe posteriorly (image 47/6). 6 mm perifissural nodule in the right lung (74/6) was present previously and is similar in size although appears more confluent today. Retrocardiac right lower lobe consolidative opacity is similar to prior although the bronchiectatic airways in  this region seen on the previous study have become impacted in the interval. Similar bandlike ground-glass opacity noted peripheral left upper lobe. Several tiny 2-3 mm pulmonary nodules identified bilaterally are unchanged since prior. No substantial pleural effusion. Musculoskeletal: No worrisome lytic or sclerotic osseous abnormality. CT ABDOMEN PELVIS FINDINGS Hepatobiliary: No suspicious focal abnormality within the liver parenchyma. There is no evidence for gallstones, gallbladder wall thickening, or pericholecystic fluid. No intrahepatic or extrahepatic biliary dilation. Pancreas: No focal mass lesion. No dilatation of the main duct. No intraparenchymal cyst. No peripancreatic edema. Spleen: Is stable in the interval. Adrenals/Urinary Tract: No adrenal nodule or mass. Cortical scarring noted lower pole left kidney. Right kidney unremarkable No evidence for hydroureter. The urinary bladder appears normal for the degree of distention. Stomach/Bowel: Stomach is unremarkable. No gastric wall thickening. No evidence of outlet obstruction. Duodenum is normally positioned as is the ligament of Treitz. 4-5 cm length of jejunal intussusception identified on 57/2 and has nearly resolved by renal delay imaging 3 minutes later, consistent with transient process. Ileal loops unremarkable. The terminal ileum is normal. The appendix is not well visualized, but there is no edema or inflammation in the region of the cecum. No gross colonic mass. No colonic wall thickening. Vascular/Lymphatic: No abdominal aortic aneurysm. No abdominal aortic atherosclerotic calcification. There is no gastrohepatic or hepatoduodenal ligament lymphadenopathy. No retroperitoneal or mesenteric lymphadenopathy. No pelvic sidewall lymphadenopathy. Reproductive: Unremarkable. Other: No intraperitoneal free fluid. Musculoskeletal: No worrisome lytic or sclerotic osseous abnormality. IMPRESSION: 1. Interval progression of ground-glass opacity and  interlobular septal thickening in the high right upper lobe posteriorly. This is nonspecific and may be related to an infectious/inflammatory etiology. As lymphangitic tumor cannot be excluded, close attention recommended. 2. Otherwise stable exam. No other new or progressive findings in the chest, abdomen, or pelvis. 3. Stable appearance of calcified right paratracheal node. Tiny pulmonary nodules remain unchanged. 4. 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 Attention on follow-up recommended. Electronically Signed   By: EMisty StanleyM.D.   On: 07/19/2021 12:19   VAS [place] EXTREMITY VEN[place] (DVT)  Result Date: 07/31/2021  Lower Venous DVT Study Patient Name:  RHOPE BRANDENBURGER Date of Exam:   07/31/2021 Medical Rec #: [id number]          Accession #:    [id number] of Birth: 205/19/1962          Patient Gender: F Patient Age:   89years [facility]  [facility] [place] LOWER EXTREMITY VENOUS (DVT) Referring Phys: NA LI --------------------------------------------------------------------------------  Indications: Pulmonary embolism.  Risk Factors: Cancer. Limitations: Poor ultrasound/tissue interface. Comparison Study: No prior studies. Performing Technologist: Oliver Hum RVT  Examination Guidelines: A complete evaluation includes B-mode imaging, spectral Doppler, color Doppler, and power Doppler as needed of all accessible portions of each vessel. Bilateral testing is considered an integral part of a complete examination. Limited examinations for reoccurring indications may be performed as noted. The reflux portion of the exam is performed with the patient in reverse Trendelenburg.  +---------+---------------+---------+-----------+----------+--------------+ RIGHT     CompressibilityPhasicitySpontaneityPropertiesThrombus Aging +---------+---------------+---------+-----------+----------+--------------+ CFV      Full           Yes      Yes                                 +---------+---------------+---------+-----------+----------+--------------+ SFJ      Full                                                        +---------+---------------+---------+-----------+----------+--------------+ FV Prox  Full                                                        +---------+---------------+---------+-----------+----------+--------------+ FV Mid   Full                                                        +---------+---------------+---------+-----------+----------+--------------+ FV DistalFull                                                        +---------+---------------+---------+-----------+----------+--------------+ PFV      Full                                                        +---------+---------------+---------+-----------+----------+--------------+ POP      Full           Yes      Yes                                 +---------+---------------+---------+-----------+----------+--------------+ PTV      Full                                                        +---------+---------------+---------+-----------+----------+--------------+ PERO     Full                                                        +---------+---------------+---------+-----------+----------+--------------+   +---------+---------------+---------+-----------+----------+--------------+  LEFT     CompressibilityPhasicitySpontaneityPropertiesThrombus Aging +---------+---------------+---------+-----------+----------+--------------+ CFV      Full           Yes      Yes                                 +---------+---------------+---------+-----------+----------+--------------+ SFJ      Full                                                         +---------+---------------+---------+-----------+----------+--------------+ FV Prox  Full                                                        +---------+---------------+---------+-----------+----------+--------------+ FV Mid   Full                                                        +---------+---------------+---------+-----------+----------+--------------+ FV DistalFull                                                        +---------+---------------+---------+-----------+----------+--------------+ PFV      Full                                                        +---------+---------------+---------+-----------+----------+--------------+ POP      Full           Yes      Yes                                 +---------+---------------+---------+-----------+----------+--------------+ PTV      Full                                                        +---------+---------------+---------+-----------+----------+--------------+ PERO     Full                                                        +---------+---------------+---------+-----------+----------+--------------+     Summary: RIGHT: - There is no evidence of deep vein thrombosis in the lower extremity.  - No cystic structure found in the popliteal fossa.  LEFT: - There is no evidence of deep vein thrombosis in the lower extremity.  - No  cystic structure found in the popliteal fossa.  *See table(s) above for measurements and observations. Electronically signed by Servando Snare MD on 07/31/2021 at 5:33:11 PM.    Final    DG ESOPHAGUS W SINGLE CM (SOL OR THIN BA)  Result Date: 07/31/2021 CLINICAL DATA:  Dysphagia. EXAM: ESOPHOGRAM/BARIUM SWALLOW TECHNIQUE: Single contrast examination was performed using thick and thin density barium and 13 mm barium tablet. FLUOROSCOPY TIME:  Radiation Exposure Index (if provided by the fluoroscopic device): 10.6 mGy. COMPARISON:  None. FINDINGS: No  definite mass or stricture is noted. No hiatal hernia or reflux is noted. Barium tablet passed through esophagus and into stomach without difficulty or delay. IMPRESSION: No significant abnormality seen in the esophagus. Electronically Signed   By: Marijo Conception M.D.   On: 07/31/2021 09:01       IMPRESSION/PLAN: 1. 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  I spent time tonight talking with the patient by phone to review the Dr. Julien Nordmann had contacted [place] and that Dr. Lisbeth Renshaw had reviewed her records and would recommend a course of palliative radiotherapy.  Her systemic therapy will likely be changing but may be another targeted oral therapy with her EGFR mutation.  Dr. Lisbeth Renshaw would likely offered 10 fractions of radiotherapy, he has offered to consider simulation tomorrow and subsequent [place] start approached for treatment tomorrow afternoon.  She is interested in proceeding after we reviewed the risks, benefits short and long-term effects of radiotherapy, she is aware of the delivery and logistics and of the need to simulate.  She will come down to our department in the morning sign written consent to proceed and then begin therapy in the afternoon.    In a visit lasting 40 minutes, greater than 50% of the time was spent by phone discussing the patient's condition, in preparation for the discussion, and coordinating the patient's care.     Carola Rhine, [facility]   **Disclaimer: This note was dictated with voice recognition software. Similar sounding words can inadvertently be transcribed and this note may contain transcription errors which may not have been corrected upon publication of note.**

## 2021-08-17 NOTE — Progress Notes (Signed)
PROGRESS NOTE    Brittany Mccarty  GTX:[id number] DOB: 02/10/1961 DOA: 08/14/2021 PCP: Pcp, No   Brief Narrative:  60 y.o. female with history of metastatic lung cancer stage IV on chemotherapy, PE on Eliquis, dysphagia and recent hospitalization from 8/27-8/29 for acute PE and dysphagia returning with progressive dysphagia.  Patient has dysphagia both to solid and liquid, GI team consulted.  Recently during her previous admission she underwent MRI brain which was negative, CTA chest which showed suspected radiation changes in the right hemithorax and a 2.3 cm calcified right peritracheal nodule.  Endoscopy attempted 9/13 but met with some resistance during the passage of the scope.  Case discussed with pulmonary but nothing much to offer at this time.  GI will attempt repeat endoscopy 9/15 with empiric balloon dilatation.   Assessment & Plan:   Active Problems:   Pulmonary embolism (HCC)   Metastatic cancer (HCC)   Dysphagia   Protein-calorie malnutrition, severe  Dysphagia/odynophagia Benign Esophageal Stenosis  Recent MBS unremarkable.  MRI brain and CT head are negative. - Followed by [facility].  EGD attempted 9/13 but met with some resistance during passage of the scope.  Case discussed with pulmonary and oncology, no further treatment offered for now. Repeat EGD today showed Esophageal narrowing s.p dilation. Clear liquid diet.  Path 9/13- showed fibrotic stroma.    Stage IV lung cancer with brain mets -Followed by Dr. Earlie Server outpatient.  On outpatient [place].  Dr. Julien Nordmann aware patient is admitted to the hospital   Wheeze/cough/shortness of breath -Improved.  Continue as needed bronchodilators   History of recurrent PE - Eliquis on hold; will resume when appropriate.    Elevated blood pressure -As needed hydralazine  DVT prophylaxis: Eliquis Code Status: Full code Family Communication: Spoke with Daughter.   Ongoing evaluation for severe dysphagia.  Dispo: The  patient is from: Home              Anticipated d/c is to: Home              Patient currently is not medically stable to d/c.   Difficult to place patient No    Subjective: Still having trouble swallowing this morning.   Review of Systems Otherwise negative except as per HPI, including: General = no fevers, chills, dizziness,  fatigue HEENT/EYES = negative for loss of vision, double vision, blurred vision,  sore throa Cardiovascular= negative for chest pain, palpitation Respiratory/lungs= negative for shortness of breath, cough, wheezing; hemoptysis,  Gastrointestinal= negative for nausea, vomiting, abdominal pain Genitourinary= negative for Dysuria MSK = Negative for arthralgia, myalgias Neurology= Negative for headache, numbness, tingling  Psychiatry= Negative for suicidal and homocidal ideation Skin= Negative for Rash   Examination:  Constitutional: Not in acute distress Respiratory: Clear to auscultation bilaterally Cardiovascular: Normal sinus rhythm, no rubs Abdomen: Nontender nondistended good bowel sounds Musculoskeletal: No edema noted Skin: No rashes seen Neurologic: CN 2-12 grossly intact.  And nonfocal Psychiatric: Normal judgment and insight. Alert and oriented x 3. Normal mood.    Objective: Vitals:   08/17/21 0750 08/17/21 1033 08/17/21 1130 08/17/21 1139  BP:  (!) 173/103 (!) 171/112 (!) 164/108  Pulse:    (!) 113  Resp:  (!) 25 (!) 24 (!) 25  Temp:  97.9 F (36.6 C) 98.6 F (37 C)   TempSrc:  Oral Oral   SpO2: 98% 100% 100% 100%  Weight:      Height:        Intake/Output Summary (Last 24 hours) at 08/17/2021  [place] filed at 08/17/2021 1122 Gross per 24 hour  Intake 1400.06 ml  Output --  Net 1400.06 ml   Filed Weights   08/15/21 0406 08/15/21 1222  Weight: 78.6 kg 78.6 kg     Data Reviewed:   CBC: Recent Labs  Lab 08/14/21 1258 08/15/21 0334 08/16/21 0342 08/17/21 0340  WBC 6.8 5.0 6.1 6.1  NEUTROABS 5.6  --   --   --    HGB 15.4* 13.7 13.0 14.5  HCT 49.5* 40.9 39.6 43.8  MCV 99.8 92.1 92.5 93.6  PLT 153 160 164 161   Basic Metabolic Panel: Recent Labs  Lab 08/14/21 1407 08/15/21 0334 08/16/21 0342 08/17/21 0340  NA 136 135 142 139  K 4.2 3.7 4.5 3.5  CL 98 103 107 103  CO2 _0 GLUCOSE 101* 106* 120* 99  BUN 11 9 5* <5*  CREATININE 0.99 0.86 0.79 0.92  CALCIUM 9.7 8.9 9.5 9.0  MG  --   --  2.2 1.9   GFR: Estimated Creatinine Clearance: 66.5 mL/min (by C-G formula based on SCr of 0.92 mg/dL). Liver Function Tests: Recent Labs  Lab 08/14/21 1407  AST 26  ALT 16  ALKPHOS 98  BILITOT 1.0  PROT 7.1  ALBUMIN 3.9   Recent Labs  Lab 08/14/21 1408  LIPASE 30   No results for input(s): AMMONIA in the last 168 hours. Coagulation Profile: No results for input(s): INR, PROTIME in the last 168 hours. Cardiac Enzymes: No results for input(s): CKTOTAL, CKMB, CKMBINDEX, TROPONINI in the last 168 hours. BNP (last 3 results) No results for input(s): PROBNP in the last 8760 hours. HbA1C: No results for input(s): HGBA1C in the last 72 hours. CBG: No results for input(s): GLUCAP in the last 168 hours. Lipid Profile: No results for input(s): CHOL, HDL, LDLCALC, TRIG, CHOLHDL, LDLDIRECT in the last 72 hours. Thyroid Function Tests: Recent Labs    08/14/21 1915  TSH 1.204   Anemia Panel: No results for input(s): VITAMINB12, FOLATE, FERRITIN, TIBC, IRON, RETICCTPCT in the last 72 hours. Sepsis Labs: Recent Labs  Lab 08/17/21 0846  PROCALCITON <0.10    Recent Results (from the past 240 hour(s))  Resp Panel by RT-PCR (Flu A&B, Covid) Nasopharyngeal Swab     Status: None   Collection Time: 08/14/21  3:22 PM   Specimen: Nasopharyngeal Swab; Nasopharyngeal(NP) swabs in vial transport medium  Result Value Ref Range Status   SARS Coronavirus 2 by RT PCR NEGATIVE NEGATIVE Final    Comment: (NOTE) SARS-CoV-2 target nucleic acids are NOT DETECTED.  The SARS-CoV-2 RNA is generally  detectable in upper respiratory specimens during the acute phase of infection. The lowest concentration of SARS-CoV-2 viral copies this assay can detect is 138 copies/mL. A negative result does not preclude SARS-Cov-2 infection and should not be used as the sole basis for treatment or other patient management decisions. A negative result may occur with  improper specimen collection/handling, submission of specimen other than nasopharyngeal swab, presence of viral mutation(s) within the areas targeted by this assay, and inadequate number of viral copies(<138 copies/mL). A negative result must be combined with clinical observations, patient history, and epidemiological information. The expected result is Negative.  Fact Sheet for Patients:  EntrepreneurPulse.com.au  Fact Sheet for Healthcare Providers:  IncredibleEmployment.be  This test is no t yet approved or cleared by the [place] FDA and  has been authorized for detection and/or diagnosis of SARS-CoV-2 by FDA under an Emergency Use Authorization (  EUA). This EUA will remain  in effect (meaning this test can be used) for the duration of the COVID-19 declaration under Section 564(b)(1) of the Act, 21 U.S.C.section 360bbb-3(b)(1), unless the authorization is terminated  or revoked sooner.       Influenza A by PCR NEGATIVE NEGATIVE Final   Influenza B by PCR NEGATIVE NEGATIVE Final    Comment: (NOTE) The Xpert Xpress SARS-CoV-2/FLU/RSV plus assay is intended as an aid in the diagnosis of influenza from Nasopharyngeal swab specimens and should not be used as a sole basis for treatment. Nasal washings and aspirates are unacceptable for Xpert Xpress SARS-CoV-2/FLU/RSV testing.  Fact Sheet for Patients: EntrepreneurPulse.com.au  Fact Sheet for Healthcare Providers: IncredibleEmployment.be  This test is not yet approved or cleared by the [place] FDA  and has been authorized for detection and/or diagnosis of SARS-CoV-2 by FDA under an Emergency Use Authorization (EUA). This EUA will remain in effect (meaning this test can be used) for the duration of the COVID-19 declaration under Section 564(b)(1) of the Act, 21 U.S.C. section 360bbb-3(b)(1), unless the authorization is terminated or revoked.  Performed at [facility], [place] [address] [place], [place] [address]          Radiology Studies: CT CHEST W CONTRAST  Result Date: 08/16/2021 CLINICAL DATA:  Cough, persistent lung mass.  Dysphagia.  A EXAM: CT CHEST WITH CONTRAST TECHNIQUE: Multidetector CT imaging of the chest was performed during intravenous contrast administration. CONTRAST:  57m OMNIPAQUE IOHEXOL 350 MG/ML SOLN COMPARISON:  CT angiogram chest 07/30/2021. FINDINGS: Cardiovascular: Heart size is within normal limits. The aorta is normal in size. There is no pericardial effusion. Right lower lobe pulmonary arteries diminutive, unchanged. Mediastinum/Nodes: The visualized thyroid gland is within normal limits. 2.3 cm partially calcified right paratracheal lymph node appears unchanged from the prior examination. The esophagus is nondilated. Lungs/Pleura: Again seen is ill-defined right lower lobe parenchymal opacity/mass extending to the right hilum measuring 5.7 x 4.0 cm image 2/86 which appears similar to the prior examination. There is cut off of rhonchi at this level, unchanged. There is prominence of right hilar soft tissue, ill-defined, unchanged from the prior examination. There is new right middle lobe airspace disease and right upper lobe airspace disease extending to the right hilum containing air bronchograms. Additionally, there is increasing ground-glass and airspace disease throughout the right lung apex. There continues to be volume loss on the right. There is a small right pleural effusion which is new from the prior examination. There is likely mild  diffuse pleural thickening there is mild diffuse pleural thickening on the right, unchanged. Scattered parenchymal calcifications are seen in the right lung. Focal ground-glass/nodular density in the left upper lobe measuring 7 mm has minimally increased when compared to the prior study. There are scattered calcified granulomas throughout the left lung. Left lung is otherwise clear. Upper Abdomen: There are calcified granulomas in the spleen. Visualized upper abdomen is otherwise within normal limits. Musculoskeletal: Degenerative changes affect the spine. IMPRESSION: 1. Dense parenchymal opacity in the right lower lobe extending to the right hilum appears unchanged from the prior examination (07/30/2021. Findings may related to post radiation changes, but superimposed infection or neoplasm not excluded. 2. There is new airspace disease in the right middle lobe and right upper lobe worrisome for infection. 3. There is a new small right pleural effusion. 4. Right pleural thickening is stable. 5. Ground-glass/nodular density in the left upper lobe measuring 7 mm has slightly increased in size. Follow-up  non-contrast CT recommended at 3-6 months to confirm persistence. If unchanged, and solid component remains <6 mm, annual CT is recommended until 5 years of stability has been established. If persistent these nodules should be considered highly suspicious if the solid component of the nodule is 6 mm or greater in size and enlarging. This recommendation follows the consensus statement: Guidelines for Management of Incidental Pulmonary Nodules Detected on CT Images: From the Fleischner Society 2017; Radiology 2017; 284:228-243. Electronically Signed   By: Ronney Asters M.D.   On: 08/16/2021 19:03        Scheduled Meds:  [MAR Hold] fentaNYL  1 patch Transdermal Q72H   [MAR Hold] ipratropium-albuterol  3 mL Nebulization BID   [MAR Hold] osimertinib mesylate  80 mg Oral Daily   [MAR Hold] pantoprazole (PROTONIX)  IV  40 mg Intravenous Q24H   [MAR Hold] senna-docusate  1 tablet Oral QHS   Continuous Infusions:  sodium chloride     dextrose 5 % and 0.9% NaCl 100 mL/hr at 08/17/21 0430   lactated ringers 0 mL (08/15/21 1915)   [MAR Hold] promethazine (PHENERGAN) injection (IM or IVPB) 25 mg (08/17/21 0756)     LOS: 2 days   Time spent= 35 mins    Naarah Borgerding Arsenio Loader, MD [facility]  If 7PM-7AM, please contact night-coverage  08/17/2021, 11:50 AM

## 2021-08-17 NOTE — Op Note (Signed)
[facility] Patient Name: Brittany Mccarty Procedure Date: 08/17/2021 MRN: [id number] Attending MD: [name] , MD Date of Birth: 05/22/1961 CSN: [id number] Age: 60 Admit Type: Inpatient Procedure:                Upper GI endoscopy Indications:              Dysphagia, For therapy of esophageal stenosis Providers:                [name], MD, Dulcy Fanny, Janee Morn,                            Technician Referring MD:             [facility] Medicines:                Monitored Anesthesia Care Complications:            No immediate complications. Estimated blood loss:                            Minimal. Estimated Blood Loss:     Estimated blood loss was minimal. Procedure:                Pre-Anesthesia Assessment:                           - Prior to the procedure, a History and Physical                            was performed, and patient medications and                            allergies were reviewed. The patient's tolerance of                            previous anesthesia was also reviewed. The risks                            and benefits of the procedure and the sedation                            options and risks were discussed with the patient.                            All questions were answered, and informed consent                            was obtained. Prior Anticoagulants: The patient has                            taken Eliquis (apixaban), last dose was 3 days                            prior to procedure. ASA Grade Assessment: III - A  patient with severe systemic disease. After                            reviewing the risks and benefits, the patient was                            deemed in satisfactory condition to undergo the                            procedure.                           After obtaining informed consent, the endoscope was                            passed under direct vision.  Throughout the                            procedure, the patient's blood pressure, pulse, and                            oxygen saturations were monitored continuously. The                            [id number]-[id number] ([id number]) Olympus endoscope was introduced                            through the mouth, and advanced to the second part                            of duodenum. The upper GI endoscopy was                            accomplished without difficulty. The patient                            tolerated the procedure well. Scope In: Scope Out: Findings:      The upper third of the esophagus was normal.      One benign-appearing, intrinsic moderate (circumferential scarring or       stenosis; an endoscope may pass) stenosis was found 30 cm from the       incisors. The mucosa appeared purplish(?bruised) and more inflammed than       what was seen on EGD 2 days ago. Moderate resistance noted on passage of       scope. With gentle consistent pressure, the stenosis was traversed. A       TTS dilator was passed through the scope. Dilation with a 12-13.5-15 mm       x 8 cm CRE balloon dilator was performed to 12 mm, 13.5 mm and 15 mm,       for 1 minute each. The dilation site was examined following endoscope       reinsertion and showed moderate mucosal disruption and moderate       improvement in luminal narrowing.      The Z-line was regular and was found 35 cm from the incisors.  The entire examined stomach was normal.      The cardia and gastric fundus were normal on retroflexion.      The examined duodenum was normal. Impression:               - Normal upper third of esophagus.                           - Benign-appearing esophageal stenosis. Dilated.                           - Z-line regular, 35 cm from the incisors.                           - Normal stomach.                           - Normal examined duodenum.                           - No specimens collected. Moderate  Sedation:      Patient did not receive moderate sedation for this procedure, but       instead received monitored anesthesia care. Recommendation:           - Clear liquid diet.                           - Continue present medications.                           - If patient has improvement, we can consider                            further dilations in the future with bigger sized                            balloons.                           - Esophageal biopsies from prior EGD are currently                            pending. Procedure Code(s):        --- Professional ---                           [phone], Esophagogastroduodenoscopy, flexible,                            transoral; with transendoscopic balloon dilation of                            esophagus (less than 30 mm diameter) Diagnosis Code(s):        --- Professional ---                           K22.2, Esophageal obstruction  R13.10, Dysphagia, unspecified CPT copyright 2019 American Medical Association. All rights reserved. The codes documented in this report are preliminary and upon coder review may  be revised to meet current compliance requirements. [name], MD 08/17/2021 11:34:53 AM This report has been signed electronically. Number of Addenda: 0

## 2021-08-17 NOTE — Transfer of Care (Signed)
Immediate Anesthesia Transfer of Care Note  Patient: Brittany Mccarty  Procedure(s) Performed: ESOPHAGOGASTRODUODENOSCOPY (EGD) WITH PROPOFOL Balloon dilation wire-guided  Patient Location: PACU  Anesthesia Type:MAC  Level of Consciousness: awake and patient cooperative  Airway & Oxygen Therapy: Patient Spontanous Breathing and Patient connected to face mask  Post-op Assessment: Report given to RN and Post -op Vital signs reviewed and stable  Post vital signs: Reviewed and stable  Last Vitals:  Vitals Value Taken Time  BP    Temp    Pulse    Resp 24 08/17/21 1130  SpO2    Vitals shown include unvalidated device data.  Last Pain:  Vitals:   08/17/21 1033  TempSrc: Oral  PainSc: 3       Patients Stated Pain Goal: 0 (82/88/33 7445)  Complications: No notable events documented.

## 2021-08-17 NOTE — Progress Notes (Signed)
DIAGNOSIS: Stage IV (T3, N2, M1c) non-small cell lung cancer, adenocarcinoma with positive EGFR mutation with deletion in exon 19 and presented with large infrahilar mass in addition to mediastinal lymphadenopathy as well as metastatic disease to the liver and bone with suspicious brain metastasis that significantly improved after the patient started treatment with Tagrisso and was not evident on the repeat MRI of the brain few days ago.  This was diagnosed in May 2021. She had progressive metastatic disease to the brain in June 2022.    PRIOR THERAPY: SRS to the metastatic brain lesions under the care of Dr. Lisbeth Renshaw. Completed on 05/24/21.    CURRENT THERAPY: Tagrisso 80 mg p.o. daily.  First dose started Apr 21, 2020.  Status post 16 months of treatment  Subjective: The patient is seen and examined today.  Her daughter-in-law was at the bedside during the visit.  She was admitted to the hospital with significant dysphagia as well as back pain and shortness of breath.  She has been on treatment with Tagrisso for the last 16 months and has been tolerating her treatment well except for the worsening dyspnea recently.  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  We also did hold Tagrisso for a week with no change in her condition.  She resumed her treatment with Tagrisso.  The patient has progressive dysphagia and was unable to tolerate any oral intake at home.  She was admitted to the hospital for IV hydration and evaluation.  She underwent EGD twice and it showed concerning finding for extrinsic compression.  The patient underwent dilatation earlier today with some improvement in her condition. She is currently on fentanyl patch in addition to Dilaudid IV for pain control.  She had repeat CT scan of the chest yesterday.  Objective: Vital signs in last 24 hours: Temp:  [97.9 F (36.6  C)-98.6 F (37 C)] 97.9 F (36.6 C) (09/15 1250) Pulse Rate:  [84-113] 89 (09/15 1250) Resp:  [16-25] 24 (09/15 1250) BP: (142-173)/(94-112) 142/94 (09/15 1250) SpO2:  [98 %-100 %] 100 % (09/15 1250)  Intake/Output from previous day: 09/14 0701 - 09/15 0700 In: 1000.1 [I.V.:900; IV Piggyback:100.1] Out: -  Intake/Output this shift: Total I/O In: 1541.6 [I.V.:1441.6; IV Piggyback:100] Out: -   General appearance: alert, cooperative, fatigued, and no distress Resp: rales bilaterally and wheezes bilaterally Cardio: regular rate and rhythm, S1, S2 normal, no murmur, click, rub or gallop GI: soft, non-tender; bowel sounds normal; no masses,  no organomegaly Extremities: extremities normal, atraumatic, no cyanosis or edema  Lab Results:  Recent Labs    08/16/21 0342 08/17/21 0340  WBC 6.1 6.1  HGB 13.0 14.5  HCT 39.6 43.8  PLT 164 160   BMET Recent Labs    08/16/21 0342 08/17/21 0340  NA 142 139  K 4.5 3.5  CL 107 103  CO2 28 27  GLUCOSE 120* 99  BUN 5* <5*  CREATININE 0.79 0.92  CALCIUM 9.5 9.0    Studies/Results: CT CHEST W CONTRAST  Result Date: 08/16/2021 CLINICAL DATA:  Cough, persistent lung mass.  Dysphagia.  A EXAM: CT CHEST WITH CONTRAST TECHNIQUE: Multidetector CT imaging of the chest was performed during intravenous contrast administration. CONTRAST:  47m OMNIPAQUE IOHEXOL 350 MG/ML SOLN COMPARISON:  CT angiogram chest 07/30/2021. FINDINGS: Cardiovascular: Heart size is within normal limits. The aorta is normal in size. There is no pericardial  effusion. Right lower lobe pulmonary arteries diminutive, unchanged. Mediastinum/Nodes: The visualized thyroid gland is within normal limits. 2.3 cm partially calcified right paratracheal lymph node appears unchanged from the prior examination. The esophagus is nondilated. Lungs/Pleura: Again seen is ill-defined right lower lobe parenchymal opacity/mass extending to the right hilum measuring 5.7 x 4.0 cm image 2/86  which appears similar to the prior examination. There is cut off of rhonchi at this level, unchanged. There is prominence of right hilar soft tissue, ill-defined, unchanged from the prior examination. There is new right middle lobe airspace disease and right upper lobe airspace disease extending to the right hilum containing air bronchograms. Additionally, there is increasing ground-glass and airspace disease throughout the right lung apex. There continues to be volume loss on the right. There is a small right pleural effusion which is new from the prior examination. There is likely mild diffuse pleural thickening there is mild diffuse pleural thickening on the right, unchanged. Scattered parenchymal calcifications are seen in the right lung. Focal ground-glass/nodular density in the left upper lobe measuring 7 mm has minimally increased when compared to the prior study. There are scattered calcified granulomas throughout the left lung. Left lung is otherwise clear. Upper Abdomen: There are calcified granulomas in the spleen. Visualized upper abdomen is otherwise within normal limits. Musculoskeletal: Degenerative changes affect the spine. IMPRESSION: 1. Dense parenchymal opacity in the right lower lobe extending to the right hilum appears unchanged from the prior examination (07/30/2021. Findings may related to post radiation changes, but superimposed infection or neoplasm not excluded. 2. There is new airspace disease in the right middle lobe and right upper lobe worrisome for infection. 3. There is a new small right pleural effusion. 4. Right pleural thickening is stable. 5. Ground-glass/nodular density in the left upper lobe measuring 7 mm has slightly increased in size. Follow-up non-contrast CT recommended at 3-6 months to confirm persistence. If unchanged, and solid component remains <6 mm, annual CT is recommended until 5 years of stability has been established. If persistent these nodules should be  considered highly suspicious if the solid component of the nodule is 6 mm or greater in size and enlarging. This recommendation follows the consensus statement: Guidelines for Management of Incidental Pulmonary Nodules Detected on CT Images: From the Fleischner Society 2017; Radiology 2017; 284:228-243. Electronically Signed   By: Ronney Asters M.D.   On: 08/16/2021 19:03     Medications: I have reviewed the patient's current medications.  CODE STATUS: Full code  Assessment/Plan: This is a very pleasant 60 years old white female diagnosed with a stage IV (T3, N2, M1 C) non-small cell lung cancer, adenocarcinoma with positive EGFR mutation with deletion in exon 19 in May 2021.  The patient was also found to have progressive disease to the brain in June 2022 status post SRS to metastatic brain lesions completed 05/24/2021. She has been on treatment with Tagrisso 80 mg p.o. daily since Apr 21, 2020 and has been tolerating her treatment fairly well. Over the last few weeks the patient has progressive shortness of breath as well as dysphagia and back pain. 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 Unfortunately repeat CT scan of the chest performed yesterday and in comparison to her previous scan in June 2022 shown above it showed significant consolidation and likely tumor progression in the posterior right lower lobe explaining the current dysphagia, shortness  of breath as well as the back pain. I had a lengthy discussion with the patient and her daughter-in-law today about her current condition and future treatment options. She would definitely benefit from repeating a PET scan for further evaluation of her disease and to rule out any other areas of disease progression. I will also arrange for the patient to have a Guardant 360 blood test to rule out the development of any  resistant mutation. If the patient has an actionable resistant mutation, she could be treated with a new targeted therapy, otherwise we would consider her for systemic chemotherapy or palliative care. I will also consult Dr. Lisbeth Renshaw for consideration of palliative radiotherapy to the progressive disease in the right lung to help with the dysphagia as well as the back pain. I will arrange for the patient to have the PET scan done on outpatient basis after discharge. Currently she cannot be discharged because of significant dysphagia and poor p.o. intake unless we consider her for the PEG tube placement temporarily. For pain management she will continue on her current fentanyl patch and Dilaudid.  We can adjust the dose of the fentanyl patch up before discharge so she would require less oral medication. The patient is also followed by Dr. Lamonte Sakai and she may benefit from repeat bronchoscopy and biopsy of the new consolidative lesion in the right lung for confirmation of the disease progression. The patient and her daughter-in-law are in agreement with the current plan. Thank you for taking good care of Ms. Wagster, I will continue to follow-up the patient with you and assist in her management.   LOS: 2 days    Eilleen Kempf 08/17/2021

## 2021-08-17 NOTE — Anesthesia Procedure Notes (Signed)
Procedure Name: MAC Date/Time: 08/17/2021 11:17 AM Performed by: Claudia Desanctis, CRNA Pre-anesthesia Checklist: Patient identified, Emergency Drugs available, Suction available and Patient being monitored Patient Re-evaluated:Patient Re-evaluated prior to induction Oxygen Delivery Method: Simple face mask

## 2021-08-17 NOTE — Telephone Encounter (Signed)
Referral sent to Dr Consuello Masse [facility]. Records faxed with receipt obtained.

## 2021-08-17 NOTE — Interval H&P Note (Signed)
History and Physical Interval Note: 60/female with EGD with possible balloon dilation with propofol.  08/17/2021 11:08 AM  Brittany Mccarty  has presented today for dysphagia, with the diagnosis of Dysphagia?esophageal narrowing, plan EGD with balloon dilation.  The various methods of treatment have been discussed with the patient and family. After consideration of risks, benefits and other options for treatment, the patient has consented to  Procedure(s): ESOPHAGOGASTRODUODENOSCOPY (EGD) WITH PROPOFOL (N/A) as a surgical intervention.  The patient's history has been reviewed, patient examined, no change in status, stable for surgery.  I have reviewed the patient's chart and labs.  Questions were answered to the patient's satisfaction.     Brittany Mccarty

## 2021-08-17 NOTE — Anesthesia Postprocedure Evaluation (Signed)
Anesthesia Post Note  Patient: Brittany Mccarty  Procedure(s) Performed: ESOPHAGOGASTRODUODENOSCOPY (EGD) WITH PROPOFOL Balloon dilation wire-guided     Patient location during evaluation: PACU Anesthesia Type: MAC Level of consciousness: awake and alert and oriented Pain management: pain level controlled Vital Signs Assessment: post-procedure vital signs reviewed and stable Respiratory status: spontaneous breathing, nonlabored ventilation and respiratory function stable Cardiovascular status: stable and blood pressure returned to baseline Postop Assessment: no apparent nausea or vomiting Anesthetic complications: no   No notable events documented.  Last Vitals:  Vitals:   08/17/21 1130 08/17/21 1139  BP: (!) 171/112 (!) 164/108  Pulse:  (!) 113  Resp: (!) 24 (!) 25  Temp: 37 C   SpO2: 100%     Last Pain:  Vitals:   08/17/21 1130  TempSrc: Oral  PainSc: 0-No pain                 Dilon Lank A.

## 2021-08-18 ENCOUNTER — Ambulatory Visit
Admission: RE | Admit: 2021-08-18 | Discharge: 2021-08-18 | Disposition: A | Discharge status: Home / Self Care | Payer: 59 | Source: Ambulatory Visit | Attending: Radiation Oncology | Admitting: Radiation Oncology

## 2021-08-18 ENCOUNTER — Inpatient Hospital Stay: Payer: 59 | Attending: Internal Medicine

## 2021-08-18 ENCOUNTER — Other Ambulatory Visit: Payer: 59

## 2021-08-18 ENCOUNTER — Encounter (HOSPITAL_COMMUNITY): Payer: Self-pay | Admitting: Gastroenterology

## 2021-08-18 ENCOUNTER — Encounter: Payer: Self-pay | Admitting: Internal Medicine

## 2021-08-18 ENCOUNTER — Other Ambulatory Visit: Payer: Self-pay | Admitting: Internal Medicine

## 2021-08-18 DIAGNOSIS — C3491 Malignant neoplasm of unspecified part of right bronchus or lung: Secondary | ICD-10-CM | POA: Insufficient documentation

## 2021-08-18 DIAGNOSIS — C3490 Malignant neoplasm of unspecified part of unspecified bronchus or lung: Secondary | ICD-10-CM

## 2021-08-18 DIAGNOSIS — R1312 Dysphagia, oropharyngeal phase: Secondary | ICD-10-CM | POA: Diagnosis not present

## 2021-08-18 DIAGNOSIS — E43 Unspecified severe protein-calorie malnutrition: Secondary | ICD-10-CM | POA: Diagnosis not present

## 2021-08-18 DIAGNOSIS — C7931 Secondary malignant neoplasm of brain: Secondary | ICD-10-CM | POA: Insufficient documentation

## 2021-08-18 DIAGNOSIS — C799 Secondary malignant neoplasm of unspecified site: Secondary | ICD-10-CM | POA: Diagnosis not present

## 2021-08-18 DIAGNOSIS — E1310 Other specified diabetes mellitus with ketoacidosis without coma: Secondary | ICD-10-CM | POA: Diagnosis not present

## 2021-08-18 DIAGNOSIS — C7951 Secondary malignant neoplasm of bone: Secondary | ICD-10-CM | POA: Insufficient documentation

## 2021-08-18 DIAGNOSIS — C787 Secondary malignant neoplasm of liver and intrahepatic bile duct: Secondary | ICD-10-CM | POA: Insufficient documentation

## 2021-08-18 LAB — CBC WITH DIFFERENTIAL (CANCER CENTER ONLY)
Abs Immature Granulocytes: 0.01 10*3/uL (ref 0.00–0.07)
Basophils Absolute: 0 10*3/uL (ref 0.0–0.1)
Basophils Relative: 0 %
Eosinophils Absolute: 0 10*3/uL (ref 0.0–0.5)
Eosinophils Relative: 0 %
HCT: 39.5 % (ref 36.0–46.0)
Hemoglobin: 13.2 g/dL (ref 12.0–15.0)
Immature Granulocytes: 0 %
Lymphocytes Relative: 7 %
Lymphs Abs: 0.4 10*3/uL — ABNORMAL LOW (ref 0.7–4.0)
MCH: 30.4 pg (ref 26.0–34.0)
MCHC: 33.4 g/dL (ref 30.0–36.0)
MCV: 91 fL (ref 80.0–100.0)
Monocytes Absolute: 0.4 10*3/uL (ref 0.1–1.0)
Monocytes Relative: 7 %
Neutro Abs: 5.2 10*3/uL (ref 1.7–7.7)
Neutrophils Relative %: 86 %
Platelet Count: 154 10*3/uL (ref 150–400)
RBC: 4.34 MIL/uL (ref 3.87–5.11)
RDW: 12.5 % (ref 11.5–15.5)
WBC Count: 6.1 10*3/uL (ref 4.0–10.5)
nRBC: 0 % (ref 0.0–0.2)

## 2021-08-18 LAB — GLUCOSE, CAPILLARY: Glucose-Capillary: 104 mg/dL — ABNORMAL HIGH (ref 70–99)

## 2021-08-18 LAB — CMP (CANCER CENTER ONLY)
ALT: 8 U/L (ref 0–44)
AST: 16 U/L (ref 15–41)
Albumin: 3.1 g/dL — ABNORMAL LOW (ref 3.5–5.0)
Alkaline Phosphatase: 126 U/L (ref 38–126)
Anion gap: 10 (ref 5–15)
BUN: <4 mg/dL — ABNORMAL LOW (ref 8–23)
CO2: 27 mmol/L (ref 22–32)
Calcium: 9.4 mg/dL (ref 8.9–10.3)
Chloride: 104 mmol/L (ref 98–111)
Creatinine: 0.79 mg/dL (ref 0.44–1.00)
GFR, Estimated: >60 mL/min (ref >60)
Glucose, Bld: 100 mg/dL — ABNORMAL HIGH (ref 70–99)
Potassium: 3.5 mmol/L (ref 3.5–5.1)
Sodium: 141 mmol/L (ref 135–145)
Total Bilirubin: 0.6 mg/dL (ref 0.3–1.2)
Total Protein: 6.3 g/dL — ABNORMAL LOW (ref 6.5–8.1)

## 2021-08-18 LAB — BASIC METABOLIC PANEL
Anion gap: 6 (ref 5–15)
BUN: <5 mg/dL — ABNORMAL LOW (ref 8–23)
CO2: 26 mmol/L (ref 22–32)
Calcium: 8.4 mg/dL — ABNORMAL LOW (ref 8.9–10.3)
Chloride: 104 mmol/L (ref 98–111)
Creatinine, Ser: 0.79 mg/dL (ref 0.44–1.00)
GFR, Estimated: >60 mL/min (ref >60)
Glucose, Bld: 103 mg/dL — ABNORMAL HIGH (ref 70–99)
Potassium: 3.1 mmol/L — ABNORMAL LOW (ref 3.5–5.1)
Sodium: 136 mmol/L (ref 135–145)

## 2021-08-18 LAB — MAGNESIUM: Magnesium: 1.7 mg/dL (ref 1.7–2.4)

## 2021-08-18 LAB — CBC
HCT: 36.2 % (ref 36.0–46.0)
Hemoglobin: 12.2 g/dL (ref 12.0–15.0)
MCH: 30.8 pg (ref 26.0–34.0)
MCHC: 33.7 g/dL (ref 30.0–36.0)
MCV: 91.4 fL (ref 80.0–100.0)
Platelets: 141 10*3/uL — ABNORMAL LOW (ref 150–400)
RBC: 3.96 MIL/uL (ref 3.87–5.11)
RDW: 12.6 % (ref 11.5–15.5)
WBC: 4.6 10*3/uL (ref 4.0–10.5)
nRBC: 0 % (ref 0.0–0.2)

## 2021-08-18 MED ORDER — MAGNESIUM SULFATE 2 GM/50ML IV SOLN
2.0000 g | Freq: Once | INTRAVENOUS | Status: AC
  Administered 2021-08-18: 2 g via INTRAVENOUS
  Filled 2021-08-18: qty 50

## 2021-08-18 MED ORDER — HYDROMORPHONE 1 MG/ML IV SOLN
INTRAVENOUS | Status: DC
  Administered 2021-08-18: 30 mg via INTRAVENOUS
  Administered 2021-08-18: 3.27 mg via INTRAVENOUS
  Administered 2021-08-18: 8 mg via INTRAVENOUS
  Administered 2021-08-19: 2.89 mg via INTRAVENOUS
  Administered 2021-08-19: 2.21 mg via INTRAVENOUS
  Administered 2021-08-19: 9.7 mg via INTRAVENOUS
  Administered 2021-08-19: 1.36 mg via INTRAVENOUS
  Filled 2021-08-18: qty 30

## 2021-08-18 MED ORDER — METHYLNALTREXONE BROMIDE 12 MG/0.6ML ~~LOC~~ SOLN
12.0000 mg | SUBCUTANEOUS | Status: DC
  Administered 2021-08-18 – 2021-08-30 (×7): 12 mg via SUBCUTANEOUS
  Filled 2021-08-18 (×7): qty 0.6

## 2021-08-18 MED ORDER — DIPHENHYDRAMINE HCL 12.5 MG/5ML PO ELIX: 12.5000 mg | ORAL_SOLUTION | Freq: Four times a day (QID) | ORAL | Status: DC | PRN

## 2021-08-18 MED ORDER — SODIUM CHLORIDE 0.9% FLUSH: 9.0000 mL | INTRAVENOUS | Status: DC | PRN

## 2021-08-18 MED ORDER — LORAZEPAM 2 MG/ML IJ SOLN
0.5000 mg | INTRAMUSCULAR | Status: DC | PRN
  Administered 2021-08-24 – 2021-08-31 (×3): 0.5 mg via INTRAVENOUS
  Filled 2021-08-18 (×3): qty 1

## 2021-08-18 MED ORDER — DIPHENHYDRAMINE HCL 50 MG/ML IJ SOLN: 12.5000 mg | Freq: Four times a day (QID) | INTRAMUSCULAR | Status: DC | PRN

## 2021-08-18 MED ORDER — POTASSIUM CHLORIDE 10 MEQ/100ML IV SOLN
10.0000 meq | INTRAVENOUS | Status: AC
  Administered 2021-08-18 (×4): 10 meq via INTRAVENOUS
  Filled 2021-08-18 (×4): qty 100

## 2021-08-18 MED ORDER — NALOXONE HCL 0.4 MG/ML IJ SOLN: 0.4000 mg | INTRAMUSCULAR | Status: DC | PRN

## 2021-08-18 NOTE — Progress Notes (Signed)
Subjective: The patient is seen and examined today.  She continues to complain of increasing fatigue and weakness as well as the back pain rated as 10 on a scale from 1-10.  She is currently on fentanyl patch and Dilaudid on as-needed basis and she uses it regularly.  She also continues to have the significant dysphagia and unable to swallow anything at this point.  She mentions that part of her swallowing issue is in the upper esophagus that she could not swallow a laxative yesterday.  She continues to have shortness of breath with dry cough.  No fever or chills.  Objective: Vital signs in last 24 hours: Temp:  [97.9 F (36.6 C)-98.6 F (37 C)] 98.3 F (36.8 C) (09/16 0500) Pulse Rate:  [89-113] 110 (09/16 0500) Resp:  [16-25] 20 (09/16 0500) BP: (142-173)/(94-112) 151/111 (09/16 0500) SpO2:  [95 %-100 %] 95 % (09/16 0500)  Intake/Output from previous day: 09/15 0701 - 09/16 0700 In: 1541.6 [I.V.:1441.6; IV Piggyback:100] Out: -  Intake/Output this shift: No intake/output data recorded.  General appearance: alert, cooperative, fatigued, and no distress Resp: diminished breath sounds RLL and rales RLL Cardio: regular rate and rhythm, S1, S2 normal, no murmur, click, rub or gallop GI: soft, non-tender; bowel sounds normal; no masses,  no organomegaly Extremities: extremities normal, atraumatic, no cyanosis or edema  Lab Results:  Recent Labs    08/17/21 0340 08/18/21 0337  WBC 6.1 4.6  HGB 14.5 12.2  HCT 43.8 36.2  PLT 160 141*   BMET Recent Labs    08/17/21 0340 08/18/21 0337  NA 139 136  K 3.5 3.1*  CL 103 104  CO2 27 26  GLUCOSE 99 103*  BUN <5* <5*  CREATININE 0.92 0.79  CALCIUM 9.0 8.4*    Studies/Results: CT CHEST W CONTRAST  Result Date: 08/16/2021 CLINICAL DATA:  Cough, persistent lung mass.  Dysphagia.  A EXAM: CT CHEST WITH CONTRAST TECHNIQUE: Multidetector CT imaging of the chest was performed during intravenous contrast administration. CONTRAST:  61mL  OMNIPAQUE IOHEXOL 350 MG/ML SOLN COMPARISON:  CT angiogram chest 07/30/2021. FINDINGS: Cardiovascular: Heart size is within normal limits. The aorta is normal in size. There is no pericardial effusion. Right lower lobe pulmonary arteries diminutive, unchanged. Mediastinum/Nodes: The visualized thyroid gland is within normal limits. 2.3 cm partially calcified right paratracheal lymph node appears unchanged from the prior examination. The esophagus is nondilated. Lungs/Pleura: Again seen is ill-defined right lower lobe parenchymal opacity/mass extending to the right hilum measuring 5.7 x 4.0 cm image 2/86 which appears similar to the prior examination. There is cut off of rhonchi at this level, unchanged. There is prominence of right hilar soft tissue, ill-defined, unchanged from the prior examination. There is new right middle lobe airspace disease and right upper lobe airspace disease extending to the right hilum containing air bronchograms. Additionally, there is increasing ground-glass and airspace disease throughout the right lung apex. There continues to be volume loss on the right. There is a small right pleural effusion which is new from the prior examination. There is likely mild diffuse pleural thickening there is mild diffuse pleural thickening on the right, unchanged. Scattered parenchymal calcifications are seen in the right lung. Focal ground-glass/nodular density in the left upper lobe measuring 7 mm has minimally increased when compared to the prior study. There are scattered calcified granulomas throughout the left lung. Left lung is otherwise clear. Upper Abdomen: There are calcified granulomas in the spleen. Visualized upper abdomen is otherwise within normal limits. Musculoskeletal:  Degenerative changes affect the spine. IMPRESSION: 1. Dense parenchymal opacity in the right lower lobe extending to the right hilum appears unchanged from the prior examination (07/30/2021. Findings may related to post  radiation changes, but superimposed infection or neoplasm not excluded. 2. There is new airspace disease in the right middle lobe and right upper lobe worrisome for infection. 3. There is a new small right pleural effusion. 4. Right pleural thickening is stable. 5. Ground-glass/nodular density in the left upper lobe measuring 7 mm has slightly increased in size. Follow-up non-contrast CT recommended at 3-6 months to confirm persistence. If unchanged, and solid component remains <6 mm, annual CT is recommended until 5 years of stability has been established. If persistent these nodules should be considered highly suspicious if the solid component of the nodule is 6 mm or greater in size and enlarging. This recommendation follows the consensus statement: Guidelines for Management of Incidental Pulmonary Nodules Detected on CT Images: From the Fleischner Society 2017; Radiology 2017; 284:228-243. Electronically Signed   By: Ronney Asters M.D.   On: 08/16/2021 19:03    Medications: I have reviewed the patient's current medications.  CODE STATUS: Full code  Assessment/Plan: This is a very pleasant 60 years old white female diagnosed with stage IV (T3, N2, M1 C) non-small cell lung cancer, adenocarcinoma with positive EGFR mutation with deletion in exon 19 in May 2021.  The patient was also found to have progressive disease to the brain in June 2022 status post SRS to metastatic brain lesions completed 05/24/2021. She has been on treatment with Tagrisso 80 mg p.o. daily since Apr 21, 2020 and has been tolerating her treatment fairly well. Over the last few weeks the patient has progressive shortness of breath as well as dysphagia and back pain. 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 Unfortunately repeat CT scan of the chest performed yesterday and in comparison to her previous  scan in June 2022 shown above it showed significant consolidation and likely tumor progression in the posterior right lower lobe explaining the current dysphagia, shortness of breath as well as the back pain. The patient was referred to Dr. Lisbeth Renshaw and she is expected to start the first fraction of radiotherapy today. I also discussed her case with Dr. Lamonte Sakai and he will consider her for bronchoscopy with biopsy for confirmation of the disease progression and also to have enough samples for molecular studies. We will arrange for the patient to have a guardant 360 blood test today to test for any resistant mutation. Regarding the dysphagia, I discussed with the patient consideration of a PEG tube placement temporary until improvement in her dysphagia from the extrinsic pressure from the tumor.  She was still like to think about it.  For pain management she will continue with her current treatment with fentanyl patch in addition to Dilaudid.  We may need to consider increasing her fentanyl patch dose before discharge. I will see the patient back on Monday but if you need any help during the weekend, please contact the oncologist on-call. Thank you for taking good care of Ms. Leet.  I will continue to follow-up the patient with you and assist in her management on as-needed basis.   LOS: 3 days    Brittany Mccarty 08/18/2021

## 2021-08-18 NOTE — Progress Notes (Signed)
[facility] Gastroenterology Progress Note  Brittany Mccarty 60 y.o. 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 She continues to be unable to drink fluids, states even with ice chips after a while will feel regurgitation of white foam.  She was seen by oncology yesterday, Dr. Julien Nordmann, based on most recent chest CT there was progression of posterior right lower lobe which could explain dysphagia from extrinsic compression. Patient subsequently had consultation with radiation oncology, and plans for first radiation treatment today 1. Patient with minimal nausea this morning.  Patient denies fever, chills, abdominal pain.  Patient has not had a bowel movement since Saturday.  At that time had to do enema and suppository .  Tried to take a laxative yesterday but had regurgitation of the pill.  Denies blood in the stool, melena. Husband is present in the room  ROS : Review of Systems  Constitutional:  Positive for malaise/fatigue. Negative for chills, fever and weight loss.  HENT:  Negative for sore throat.   Respiratory:  Positive for cough and shortness of breath.   Cardiovascular:  Negative for chest pain.  Gastrointestinal:  Positive for constipation, nausea and vomiting. Negative for abdominal pain, blood in stool, diarrhea, heartburn and melena.  Musculoskeletal:  Negative for falls.  Neurological:  Negative for focal weakness.  Psychiatric/Behavioral:  Negative for memory loss. The patient is nervous/anxious.    Endoscopy 08/15/2021 that showed benign-appearing esophageal stenosis which was biopsied pending, possible extrinsic compression. CT Chest 08/16/2021 likely tumor progression in the posterior right lower lobe explaining the current dysphagia, shortness of breath as well as the back pain. Endoscopy 08/16/2021  Balloon dilation performed with 12 mm, 13.5 mm, 15 mm balloon today.   Objective: Vital signs in last 24 hours: Vitals:   08/17/21 2003 08/18/21 0500  BP: (!) 156/97 (!) 151/111  Pulse: (!) 103 (!) 110  Resp: 18 20  Temp: 97.9 F (36.6 C) 98.3 F (36.8 C)  SpO2: 97% 95%    Physical Exam: Physical Exam Constitutional:      General: She is not in acute distress. Eyes:     Conjunctiva/sclera: Conjunctivae normal.     Pupils: Pupils are equal, round, and reactive to light.  Abdominal:     General: There is no distension.     Palpations: Abdomen is soft.     Tenderness: There is no abdominal tenderness. There is no guarding.  Musculoskeletal:        General: Normal range of motion.  Skin:    Coloration: Skin is not jaundiced.  Neurological:     General: No focal deficit present.     Mental Status: She is alert.     Lab Results: Recent Labs    08/17/21 0340 08/18/21 0337  NA 139 136  K 3.5 3.1*  CL 103 104  CO2 27 26  GLUCOSE 99 103*  BUN <5* <5*  CREATININE 0.92 0.79  CALCIUM 9.0 8.4*  MG 1.9 1.7    No results for input(s): AST, ALT, ALKPHOS, BILITOT, PROT, ALBUMIN in the last 72 hours.  Recent Labs    08/18/21 0337 08/18/21 0940  WBC 4.6 6.1  NEUTROABS  --  5.2  HGB 12.2 13.2  HCT 36.2 39.5  MCV 91.4 91.0  PLT 141* 154    No results for input(s): LABPROT, INR in the last 72  hours.  Lab Results: Results for orders placed or performed during the hospital encounter of 08/14/21 (from the past 48 hour(s))  Basic metabolic panel     Status: Abnormal   Collection Time: 08/17/21  3:40 AM  Result Value Ref Range   Sodium 139 135 - 145 mmol/L   Potassium 3.5 3.5 - 5.1 mmol/L    Comment: DELTA CHECK NOTED   Chloride 103 98 - 111 mmol/L   CO2 27 22 - 32 mmol/L   Glucose, Bld 99 70 - 99 mg/dL    Comment: Glucose reference range applies only to samples taken after fasting for at least 8 hours.   BUN <5 (L) 8 - 23 mg/dL   Creatinine, Ser 0.92 0.44 - 1.00  mg/dL   Calcium 9.0 8.9 - 10.3 mg/dL   GFR, Estimated >60 >60 mL/min    Comment: (NOTE) Calculated using the CKD-EPI Creatinine Equation (2021)    Anion gap 9 5 - 15    Comment: Performed at [facility], [place] [address] [place], [place] [address]  CBC     Status: None   Collection Time: 08/17/21  3:40 AM  Result Value Ref Range   WBC 6.1 4.0 - 10.5 K/uL   RBC 4.68 3.87 - 5.11 MIL/uL   Hemoglobin 14.5 12.0 - 15.0 g/dL   HCT 43.8 36.0 - 46.0 %   MCV 93.6 80.0 - 100.0 fL   MCH 31.0 26.0 - 34.0 pg   MCHC 33.1 30.0 - 36.0 g/dL   RDW 12.9 11.5 - 15.5 %   Platelets 160 150 - 400 K/uL   nRBC 0.0 0.0 - 0.2 %    Comment: Performed at [facility], [place] [address], [place], [place] [address]  Magnesium     Status: None   Collection Time: 08/17/21  3:40 AM  Result Value Ref Range   Magnesium 1.9 1.7 - 2.4 mg/dL    Comment: Performed at [facility], [place] [address] [place], [place] [address]  Brain natriuretic peptide     Status: None   Collection Time: 08/17/21  8:46 AM  Result Value Ref Range   B Natriuretic Peptide 83.1 0.0 - 100.0 pg/mL    Comment: Performed at [facility], [place] [address], [place], [place] [address]  Procalcitonin     Status: None   Collection Time: 08/17/21  8:46 AM  Result Value Ref Range   Procalcitonin <0.10 ng/mL    Comment:        Interpretation: PCT (Procalcitonin) <= 0.5 ng/mL: Systemic infection (sepsis) is not likely. Local bacterial infection is possible. (NOTE)       Sepsis PCT Algorithm           Lower Respiratory Tract                                      Infection PCT Algorithm    ----------------------------     ----------------------------         PCT < 0.25 ng/mL                PCT < 0.10 ng/mL          Strongly encourage             Strongly discourage   discontinuation of antibiotics    initiation of antibiotics    ----------------------------      -----------------------------  PCT 0.25 - 0.50 ng/mL            PCT 0.10 - 0.25 ng/mL               OR       >80% decrease in PCT            Discourage initiation of                                            antibiotics      Encourage discontinuation           of antibiotics    ----------------------------     -----------------------------         PCT >= 0.50 ng/mL              PCT 0.26 - 0.50 ng/mL               AND        <80% decrease in PCT             Encourage initiation of                                             antibiotics       Encourage continuation           of antibiotics    ----------------------------     -----------------------------        PCT >= 0.50 ng/mL                  PCT > 0.50 ng/mL               AND         increase in PCT                  Strongly encourage                                      initiation of antibiotics    Strongly encourage escalation           of antibiotics                                     -----------------------------                                           PCT <= 0.25 ng/mL                                                 OR                                        > 80% decrease in PCT  Discontinue / Do not initiate                                             antibiotics  Performed at [place] [address] [place], [place] [address]   Glucose, capillary     Status: Abnormal   Collection Time: 08/17/21  4:49 PM  Result Value Ref Range   Glucose-Capillary 102 (H) 70 - 99 mg/dL    Comment: Glucose reference range applies only to samples taken after fasting for at least 8 hours.  Basic metabolic panel     Status: Abnormal   Collection Time: 08/18/21  3:37 AM  Result Value Ref Range   Sodium 136 135 - 145 mmol/L   Potassium 3.1 (L) 3.5 - 5.1 mmol/L   Chloride 104 98 - 111 mmol/L   CO2 26 22 - 32 mmol/L   Glucose, Bld 103 (H) 70 - 99 mg/dL    Comment:  Glucose reference range applies only to samples taken after fasting for at least 8 hours.   BUN <5 (L) 8 - 23 mg/dL   Creatinine, Ser 0.79 0.44 - 1.00 mg/dL   Calcium 8.4 (L) 8.9 - 10.3 mg/dL   GFR, Estimated >60 >60 mL/min    Comment: (NOTE) Calculated using the CKD-EPI Creatinine Equation (2021)    Anion gap 6 5 - 15    Comment: Performed at [facility], [place] [address] [place], [place] [address]  CBC     Status: Abnormal   Collection Time: 08/18/21  3:37 AM  Result Value Ref Range   WBC 4.6 4.0 - 10.5 K/uL   RBC 3.96 3.87 - 5.11 MIL/uL   Hemoglobin 12.2 12.0 - 15.0 g/dL   HCT 36.2 36.0 - 46.0 %   MCV 91.4 80.0 - 100.0 fL   MCH 30.8 26.0 - 34.0 pg   MCHC 33.7 30.0 - 36.0 g/dL   RDW 12.6 11.5 - 15.5 %   Platelets 141 (L) 150 - 400 K/uL   nRBC 0.0 0.0 - 0.2 %    Comment: Performed at [facility], [place] [address] [place], [place] [address]  Magnesium     Status: None   Collection Time: 08/18/21  3:37 AM  Result Value Ref Range   Magnesium 1.7 1.7 - 2.4 mg/dL    Comment: Performed at [facility], [place] [address], [place], [place] [address]    Assessment/Plan: Dysphagia in patient with metastatic adenocarcinoma non-small cell lung cancer.  normal barium swallow 07/31/2021. EGD 08/15/2021 benign-appearing esophageal stenosis showed possible extrinsic compression, biopsies from 08/15/2021 showed squamous mucosa with small amount of fibrotic submucosal stroma without evidence of dysplasia or malignancy, raising the possibility of radiation-induced esophageal stricture.  Repeat endoscopy with dilatation to 15 mm did not have any improvement in symptoms. Repeat CT chest showed progressive posterior right lower lobe explaining the current dysphagia. Had consultation with Dr. Lisbeth Renshaw, radiation oncology, getting first radiation treatment today. Could consider repeat endoscopy with dilatation, however with now known extrinsic  compression do not feel would be helpful at this time.  Patient is having decreased bowel movements, has been virtually n.p.o. for 2 weeks, can add Dulcolax suppositories as needed as patient's not able to take pills p.o.  Patient may need possible PEG placement if she remains symptomatic.     Vladimir Crofts  PA-C 08/18/2021, 10:40 AM  Contact #  [phone]

## 2021-08-18 NOTE — Progress Notes (Signed)
PROGRESS NOTE    Brittany Mccarty  XHF:[id number] DOB: 06/30/1961 DOA: 08/14/2021 PCP: Pcp, No   Brief Narrative:  60 y.o. female with history of metastatic lung cancer stage IV on chemotherapy, PE on Eliquis, dysphagia and recent hospitalization from 8/27-8/29 for acute PE and dysphagia returning with progressive dysphagia.  Patient has dysphagia both to solid and liquid, GI team consulted.  Recently during her previous admission she underwent MRI brain which was negative, CTA chest which showed suspected radiation changes in the right hemithorax and a 2.3 cm calcified right peritracheal nodule.  Endoscopy attempted 9/13 but met with some resistance during the passage of the scope.  Case discussed with pulmonary but nothing much to offer at this time.  GI will attempt repeat endoscopy 9/15 with empiric balloon dilatation.   Assessment & Plan:   Active Problems:   Pulmonary embolism (HCC)   Metastatic cancer (HCC)   Dysphagia   Protein-calorie malnutrition, severe  Dysphagia/odynophagia Benign Esophageal Stenosis  Recent MBS unremarkable.  MRI brain and CT head are negative. - Followed by [facility].  Difficult to perform EGD on 9/13 due to resistance and esophageal stenosis.  Repeat EGD performed 9/15 with attempts to dilate the area.  Continue clear liquid diet, advance as tolerated.  Patient may require PEG tube until underlying issue has been resolved. Path 9/13- showed fibrotic stroma. - Seen by palliative care team.  Started her on PCA pump   Stage IV lung cancer with brain mets -Followed by Dr. Earlie Server outpatient.  On outpatient [place].  Repeat CT scan showed progression of her disease therefore radiation oncology consulted for radiation.   Wheeze/cough/shortness of breath -Improved.  Continue as needed bronchodilators   History of recurrent PE - Eliquis remains on hold but need to restart it sooner than later if no further procedures are planned   Elevated blood  pressure -As needed hydralazine  DVT prophylaxis: Eliquis Code Status: Full code Family Communication: Spoke with Daughter.   Ongoing evaluation for severe dysphagia.  Dispo: The patient is from: Home              Anticipated d/c is to: Home              Patient currently is not medically stable to d/c.   Difficult to place patient No    Subjective: Still having trouble swallowing and continues to have pain requiring IV Dilaudid very frequently  Review of Systems Otherwise negative except as per HPI, including: General: Denies fever, chills, night sweats or unintended weight loss. Resp: Denies cough, wheezing, shortness of breath. Cardiac: Denies chest pain, palpitations, orthopnea, paroxysmal nocturnal dyspnea. GI: Denies abdominal pain, nausea, vomiting, diarrhea or constipation GU: Denies dysuria, frequency, hesitancy or incontinence MS: Denies muscle aches, joint pain or swelling Neuro: Denies headache, neurologic deficits (focal weakness, numbness, tingling), abnormal gait Psych: Denies anxiety, depression, SI/HI/AVH Skin: Denies new rashes or lesions ID: Denies sick contacts, exotic exposures, travel  Examination:  Constitutional: Not in acute distress Respiratory: Clear to auscultation bilaterally Cardiovascular: Normal sinus rhythm, no rubs Abdomen: Nontender nondistended good bowel sounds Musculoskeletal: No edema noted Skin: No rashes seen Neurologic: CN 2-12 grossly intact.  And nonfocal Psychiatric: Normal judgment and insight. Alert and oriented x 3. Normal mood.    Objective: Vitals:   08/17/21 1250 08/17/21 2003 08/18/21 0500 08/18/21 1155  BP: (!) 142/94 (!) 156/97 (!) 151/111 (!) 138/94  Pulse: 89 (!) 103 (!) 110 94  Resp: (!) 24 18 20    Temp: 97.9  F (36.6 C) 97.9 F (36.6 C) 98.3 F (36.8 C)   TempSrc: Oral Oral Oral   SpO2: 100% 97% 95%   Weight:      Height:        Intake/Output Summary (Last 24 hours) at 08/18/2021 1255 Last data filed  at 08/17/2021 1400 Gross per 24 hour  Intake 1141.63 ml  Output --  Net 1141.63 ml   Filed Weights   08/15/21 0406 08/15/21 1222  Weight: 78.6 kg 78.6 kg     Data Reviewed:   CBC: Recent Labs  Lab 08/14/21 1258 08/15/21 0334 08/16/21 0342 08/17/21 0340 08/18/21 0337 08/18/21 0940  WBC 6.8 5.0 6.1 6.1 4.6 6.1  NEUTROABS 5.6  --   --   --   --  5.2  HGB 15.4* 13.7 13.0 14.5 12.2 13.2  HCT 49.5* 40.9 39.6 43.8 36.2 39.5  MCV 99.8 92.1 92.5 93.6 91.4 91.0  PLT 153 160 164 160 141* 415   Basic Metabolic Panel: Recent Labs  Lab 08/15/21 0334 08/16/21 0342 08/17/21 0340 08/18/21 0337 08/18/21 0940  NA 135 142 139 136 141  K 3.7 4.5 3.5 3.1* 3.5  CL 103 107 103 104 104  CO2 26 28 27 26 27   GLUCOSE 106* 120* 99 103* 100*  BUN 9 5* <5* <5* <4*  CREATININE 0.86 0.79 0.92 0.79 0.79  CALCIUM 8.9 9.5 9.0 8.4* 9.4  MG  --  2.2 1.9 1.7  --    GFR: Estimated Creatinine Clearance: 76.5 mL/min (by C-G formula based on SCr of 0.79 mg/dL). Liver Function Tests: Recent Labs  Lab 08/14/21 1407 08/18/21 0940  AST 26 16  ALT 16 8  ALKPHOS 98 126  BILITOT 1.0 0.6  PROT 7.1 6.3*  ALBUMIN 3.9 3.1*   Recent Labs  Lab 08/14/21 1408  LIPASE 30   No results for input(s): AMMONIA in the last 168 hours. Coagulation Profile: No results for input(s): INR, PROTIME in the last 168 hours. Cardiac Enzymes: No results for input(s): CKTOTAL, CKMB, CKMBINDEX, TROPONINI in the last 168 hours. BNP (last 3 results) No results for input(s): PROBNP in the last 8760 hours. HbA1C: No results for input(s): HGBA1C in the last 72 hours. CBG: Recent Labs  Lab 08/17/21 1649  GLUCAP 102*   Lipid Profile: No results for input(s): CHOL, HDL, LDLCALC, TRIG, CHOLHDL, LDLDIRECT in the last 72 hours. Thyroid Function Tests: No results for input(s): TSH, T4TOTAL, FREET4, T3FREE, THYROIDAB in the last 72 hours.  Anemia Panel: No results for input(s): VITAMINB12, FOLATE, FERRITIN, TIBC, IRON,  RETICCTPCT in the last 72 hours. Sepsis Labs: Recent Labs  Lab 08/17/21 0846  PROCALCITON <0.10    Recent Results (from the past 240 hour(s))  Resp Panel by RT-PCR (Flu A&B, Covid) Nasopharyngeal Swab     Status: None   Collection Time: 08/14/21  3:22 PM   Specimen: Nasopharyngeal Swab; Nasopharyngeal(NP) swabs in vial transport medium  Result Value Ref Range Status   SARS Coronavirus 2 by RT PCR NEGATIVE NEGATIVE Final    Comment: (NOTE) SARS-CoV-2 target nucleic acids are NOT DETECTED.  The SARS-CoV-2 RNA is generally detectable in upper respiratory specimens during the acute phase of infection. The lowest concentration of SARS-CoV-2 viral copies this assay can detect is 138 copies/mL. A negative result does not preclude SARS-Cov-2 infection and should not be used as the sole basis for treatment or other patient management decisions. A negative result may occur with  improper specimen collection/handling, submission of specimen other than  nasopharyngeal swab, presence of viral mutation(s) within the areas targeted by this assay, and inadequate number of viral copies(<138 copies/mL). A negative result must be combined with clinical observations, patient history, and epidemiological information. The expected result is Negative.  Fact Sheet for Patients:  EntrepreneurPulse.com.au  Fact Sheet for Healthcare Providers:  IncredibleEmployment.be  This test is no t yet approved or cleared by the [place] FDA and  has been authorized for detection and/or diagnosis of SARS-CoV-2 by FDA under an Emergency Use Authorization (EUA). This EUA will remain  in effect (meaning this test can be used) for the duration of the COVID-19 declaration under Section 564(b)(1) of the Act, 21 U.S.C.section 360bbb-3(b)(1), unless the authorization is terminated  or revoked sooner.       Influenza A by PCR NEGATIVE NEGATIVE Final   Influenza B by PCR NEGATIVE  NEGATIVE Final    Comment: (NOTE) The Xpert Xpress SARS-CoV-2/FLU/RSV plus assay is intended as an aid in the diagnosis of influenza from Nasopharyngeal swab specimens and should not be used as a sole basis for treatment. Nasal washings and aspirates are unacceptable for Xpert Xpress SARS-CoV-2/FLU/RSV testing.  Fact Sheet for Patients: EntrepreneurPulse.com.au  Fact Sheet for Healthcare Providers: IncredibleEmployment.be  This test is not yet approved or cleared by the [place] FDA and has been authorized for detection and/or diagnosis of SARS-CoV-2 by FDA under an Emergency Use Authorization (EUA). This EUA will remain in effect (meaning this test can be used) for the duration of the COVID-19 declaration under Section 564(b)(1) of the Act, 21 U.S.C. section 360bbb-3(b)(1), unless the authorization is terminated or revoked.  Performed at [facility], [place] [address], [place], [place] [address]          Radiology Studies: CT CHEST W CONTRAST  Result Date: 08/16/2021 CLINICAL DATA:  Cough, persistent lung mass.  Dysphagia.  A EXAM: CT CHEST WITH CONTRAST TECHNIQUE: Multidetector CT imaging of the chest was performed during intravenous contrast administration. CONTRAST:  66m OMNIPAQUE IOHEXOL 350 MG/ML SOLN COMPARISON:  CT angiogram chest 07/30/2021. FINDINGS: Cardiovascular: Heart size is within normal limits. The aorta is normal in size. There is no pericardial effusion. Right lower lobe pulmonary arteries diminutive, unchanged. Mediastinum/Nodes: The visualized thyroid gland is within normal limits. 2.3 cm partially calcified right paratracheal lymph node appears unchanged from the prior examination. The esophagus is nondilated. Lungs/Pleura: Again seen is ill-defined right lower lobe parenchymal opacity/mass extending to the right hilum measuring 5.7 x 4.0 cm image 2/86 which appears similar to the prior examination.  There is cut off of rhonchi at this level, unchanged. There is prominence of right hilar soft tissue, ill-defined, unchanged from the prior examination. There is new right middle lobe airspace disease and right upper lobe airspace disease extending to the right hilum containing air bronchograms. Additionally, there is increasing ground-glass and airspace disease throughout the right lung apex. There continues to be volume loss on the right. There is a small right pleural effusion which is new from the prior examination. There is likely mild diffuse pleural thickening there is mild diffuse pleural thickening on the right, unchanged. Scattered parenchymal calcifications are seen in the right lung. Focal ground-glass/nodular density in the left upper lobe measuring 7 mm has minimally increased when compared to the prior study. There are scattered calcified granulomas throughout the left lung. Left lung is otherwise clear. Upper Abdomen: There are calcified granulomas in the spleen. Visualized upper abdomen is otherwise within normal limits. Musculoskeletal: Degenerative changes affect the  spine. IMPRESSION: 1. Dense parenchymal opacity in the right lower lobe extending to the right hilum appears unchanged from the prior examination (07/30/2021. Findings may related to post radiation changes, but superimposed infection or neoplasm not excluded. 2. There is new airspace disease in the right middle lobe and right upper lobe worrisome for infection. 3. There is a new small right pleural effusion. 4. Right pleural thickening is stable. 5. Ground-glass/nodular density in the left upper lobe measuring 7 mm has slightly increased in size. Follow-up non-contrast CT recommended at 3-6 months to confirm persistence. If unchanged, and solid component remains <6 mm, annual CT is recommended until 5 years of stability has been established. If persistent these nodules should be considered highly suspicious if the solid component of  the nodule is 6 mm or greater in size and enlarging. This recommendation follows the consensus statement: Guidelines for Management of Incidental Pulmonary Nodules Detected on CT Images: From the Fleischner Society 2017; Radiology 2017; 284:228-243. Electronically Signed   By: Ronney Asters M.D.   On: 08/16/2021 19:03        Scheduled Meds:  fentaNYL  1 patch Transdermal Q72H   HYDROmorphone   Intravenous Q4H   ipratropium-albuterol  3 mL Nebulization BID   methylnaltrexone  12 mg Subcutaneous Q48H   osimertinib mesylate  80 mg Oral Daily   pantoprazole (PROTONIX) IV  40 mg Intravenous Q12H   senna-docusate  1 tablet Oral QHS   Continuous Infusions:  dextrose 5 % and 0.9% NaCl 100 mL/hr at 08/18/21 1117   lactated ringers 0 mL (08/15/21 1915)   potassium chloride 10 mEq (08/18/21 1254)   promethazine (PHENERGAN) injection (IM or IVPB) 25 mg (08/17/21 1551)     LOS: 3 days   Time spent= 35 mins    Brenya Taulbee Arsenio Loader, MD [facility]  If 7PM-7AM, please contact night-coverage  08/18/2021, 12:55 PM

## 2021-08-18 NOTE — Consult Note (Signed)
Consultation Note Date: 08/18/2021   Patient Name: Brittany Mccarty  DOB: [birth year]  MRN: [id number]  Age / Sex: 60 y.o., female  PCP: Pcp, No Referring Physician: Damita Lack, MD  Reason for Consultation: Non pain symptom management and Pain control  HPI/Patient Profile:  Brittany Mccarty is a 60 yo woman with metastatic lung cancer diagnosed in 04/2020. Symptoms of disease progression started about 2 months. She is now hospitalized with upper esophageal stricture/external compression and has not been able to take in PO for 2 weeks.  Conservative measures to treat this symptomatically and with dilation have not helped.  Discussed case with Dr. Reesa Chew earlier this week-the dilation has not improved this much but fortunately radiation oncology may be able to help.   Patient is currently struggling with severe pain-mostly in her back and epigastric area.   She tells me that her pain has not been under control for months-she describes having her complaints minimized and would like to get relief. We discussed her goal- to be living with her cancer not suffering from her cancer. I described the services offered by palliative care and she very much wants our assistance and is agreeable to our service being involved with her symptom management.   Clinical Assessment and Recommendations:  Uncontrolled, Cancer Related Pain.  Pain is visceral by description and pain is being referred through the impact of mediastinal compression on nerve pathways. Difficult to localize, but throbbing and aching, comes in waves, but she never gets complete relief. Trouble sleeping and coping. Given how long her pain has been poorly controlled I anticipate she will need very large doses of opioids to achieve relief- difficult to know what her starting point will be for long acting opioids since she has not achieve adequate relief yet. Was  started on Fentany Patch at 83mcg yesterday, which will help provide basal pain control- she is still using 10+mg of IV hydromorphone  in last 24 hours. Will place her on a hydromorphone PCA with a low basal rate at 0.10/hr. Will give her q15 bolus of  for hourly total of 2mg .  Poor PO Intake/Esophageal Compression:  I support placement of a PEG for supplemental nutrition until radiation and other treatment responses occur.   Opioid Induced Constipation: Patient cannot take oral laxatives. Will give her Relistor for OIC q48, first dose now. Anxiety and Sleep PRN Ativan ordered.  Provided support and encouragement for her journey.  Will follow closely and also see in oncology palliative clinic after discharge.       Primary Diagnoses: Present on Admission:  Metastatic cancer ([place])  Pulmonary embolism ([place])   I have reviewed the medical record, interviewed the patient and family, and examined the patient. The following aspects are pertinent.  Past Medical History:  Diagnosis Date   NSCL CA DX'D 04/07/20   Social History   Socioeconomic History   Marital status: Married    Spouse name: Not on file   Number of children: Not on file   Years of education: Not on file  Highest education level: Not on file  Occupational History   Not on file  Tobacco Use   Smoking status: Never   Smokeless tobacco: Never  Substance and Sexual Activity   Alcohol use: Not Currently   Drug use: Never   Sexual activity: Not on file  Other Topics Concern   Not on file  Social History Narrative   Not on file   Social Determinants of Health   Financial Resource Strain: Not on file  Food Insecurity: Not on file  Transportation Needs: Not on file  Physical Activity: Not on file  Stress: Not on file  Social Connections: Not on file   History reviewed. No pertinent family history. Scheduled Meds:  fentaNYL  1 patch Transdermal Q72H   HYDROmorphone   Intravenous Q4H    ipratropium-albuterol  3 mL Nebulization BID   osimertinib mesylate  80 mg Oral Daily   pantoprazole (PROTONIX) IV  40 mg Intravenous Q12H   senna-docusate  1 tablet Oral QHS   Continuous Infusions:  dextrose 5 % and 0.9% NaCl 100 mL/hr at 08/18/21 1117   lactated ringers 0 mL (08/15/21 1915)   magnesium sulfate bolus IVPB 2 g (08/18/21 1120)   potassium chloride 10 mEq (08/18/21 1121)   promethazine (PHENERGAN) injection (IM or IVPB) 25 mg (08/17/21 1551)   PRN Meds:.acetaminophen, benzonatate, cyclobenzaprine, diphenhydrAMINE **OR** diphenhydrAMINE, hydrALAZINE, ipratropium-albuterol, LORazepam, naloxone **AND** sodium chloride flush, ondansetron **OR** ondansetron (ZOFRAN) IV, promethazine (PHENERGAN) injection (IM or IVPB), promethazine-codeine, traZODone Medications Prior to Admission:  Prior to Admission medications   Medication Sig Start Date End Date Taking? Authorizing Provider  apixaban (ELIQUIS) 5 MG TABS tablet Take 2 tablets (10 mg total) by mouth 2 (two) times daily for 7 days, THEN 1 tablet (5 mg total) 2 (two) times daily for 28 days. Patient taking differently: 10mg  orally daily currently 07/31/21 09/04/21 Yes Sheikh, Omair Latif, DO  benzonatate (TESSALON) 100 MG capsule Take 1 capsule (100 mg total) by mouth 3 (three) times daily as needed for cough. 07/28/21  Yes Heilingoetter, Cassandra L, PA-C  cyclobenzaprine (FLEXERIL) 10 MG tablet Take 1 tablet (10 mg total) by mouth 2 (two) times daily as needed for muscle spasms. 07/30/21  Yes McDonald, Mia A, PA-C  fentaNYL (DURAGESIC) 50 MCG/HR Place 1 patch onto the skin every 3 (three) days. 08/11/21  Yes Heilingoetter, Cassandra L, PA-C  omeprazole (PRILOSEC) 20 MG capsule Take 1 capsule (20 mg total) by mouth daily. 07/24/21  Yes Heilingoetter, Cassandra L, PA-C  ondansetron (ZOFRAN) 4 MG tablet Take 4 mg by mouth every 8 (eight) hours as needed for nausea or vomiting. 04/08/20  Yes [provider]  osimertinib mesylate  (TAGRISSO) 80 MG tablet Take 1 tablet (80 mg total) by mouth daily. 06/08/21  Yes Curt Bears, MD  oxyCODONE (OXY IR/ROXICODONE) 5 MG immediate release tablet Take 1 tablet (5 mg total) by mouth 3 (three) times daily between meals as needed for severe pain. 07/31/21  Yes Heilingoetter, Cassandra L, PA-C  promethazine-codeine (PHENERGAN WITH CODEINE) 6.25-10 MG/5ML syrup Take 5-10 mLs by mouth every 6 (six) hours as needed for cough. 08/11/21  Yes Collene Gobble, MD  lidocaine (XYLOCAINE) 2 % solution Use as directed 15 mLs in the mouth or throat as needed for mouth pain. Patient not taking: No sig reported 07/30/21   McDonald, Mia A, PA-C  predniSONE (DELTASONE) 20 MG tablet Take 4 tablets (80 mg) daily for 1 week, followed by 2 tablets (40 mg) daily for 1 week, 1  tablet (20 mg) for 1 week. Patient not taking: Reported on 08/14/2021 07/24/21   Heilingoetter, Cassandra L, PA-C   Allergies  Allergen Reactions   Other     no blood transfusions    Cefdinir Rash   Review of Systems  Physical Exam  Vital Signs: BP (!) 138/94 (BP Location: Left Arm)   Pulse 94   Temp 98.3 F (36.8 C) (Oral)   Resp 20   Ht 5\' 5"  (1.651 m)   Wt 78.6 kg   SpO2 95%   BMI 28.84 kg/m  Pain Scale: 0-10 POSS *See Group Information*: S-Acceptable,Sleep, easy to arouse Pain Score: Asleep   SpO2: SpO2: 95 % O2 Device:SpO2: 95 % O2 Flow Rate: .O2 Flow Rate (L/min): 6 L/min  IO: Intake/output summary:  Intake/Output Summary (Last 24 hours) at 08/18/2021 1156 Last data filed at 08/17/2021 1400 Gross per 24 hour  Intake 1141.63 ml  Output --  Net 1141.63 ml    LBM:   Baseline Weight: Weight: 78.6 kg Most recent weight: Weight: 78.6 kg     Palliative Assessment/Data:     Signed by: Lane Hacker, DO   Please contact Palliative Medicine Team phone at [phone] for questions and concerns.  For individual provider: See Amion  Time: 70 min Greater than 50%  of this time was spent counseling and  coordinating care related to the above assessment and plan.

## 2021-08-18 NOTE — Anesthesia Postprocedure Evaluation (Signed)
Anesthesia Post Note  Patient: Brittany Mccarty  Procedure(s) Performed: ESOPHAGOGASTRODUODENOSCOPY (EGD) WITH PROPOFOL BIOPSY     Patient location during evaluation: Endoscopy Anesthesia Type: MAC Level of consciousness: awake and alert Pain management: pain level controlled Vital Signs Assessment: post-procedure vital signs reviewed and stable Respiratory status: spontaneous breathing, nonlabored ventilation, respiratory function stable and patient connected to nasal cannula oxygen Cardiovascular status: stable and blood pressure returned to baseline Postop Assessment: no apparent nausea or vomiting Anesthetic complications: no   No notable events documented.  Last Vitals:  Vitals:   08/17/21 2003 08/18/21 0500  BP: (!) 156/97 (!) 151/111  Pulse: (!) 103 (!) 110  Resp: 18 20  Temp: 36.6 C 36.8 C  SpO2: 97% 95%    Last Pain:  Vitals:   08/18/21 0616  TempSrc:   PainSc: 6                  Charlie Char

## 2021-08-19 ENCOUNTER — Inpatient Hospital Stay (HOSPITAL_COMMUNITY): Payer: 59

## 2021-08-19 DIAGNOSIS — Z7189 Other specified counseling: Secondary | ICD-10-CM

## 2021-08-19 DIAGNOSIS — E43 Unspecified severe protein-calorie malnutrition: Secondary | ICD-10-CM | POA: Diagnosis not present

## 2021-08-19 DIAGNOSIS — Z515 Encounter for palliative care: Secondary | ICD-10-CM

## 2021-08-19 DIAGNOSIS — C799 Secondary malignant neoplasm of unspecified site: Secondary | ICD-10-CM | POA: Diagnosis not present

## 2021-08-19 DIAGNOSIS — R1312 Dysphagia, oropharyngeal phase: Secondary | ICD-10-CM | POA: Diagnosis not present

## 2021-08-19 DIAGNOSIS — R1310 Dysphagia, unspecified: Secondary | ICD-10-CM | POA: Diagnosis not present

## 2021-08-19 LAB — BASIC METABOLIC PANEL
Anion gap: 8 (ref 5–15)
BUN: <5 mg/dL — ABNORMAL LOW (ref 8–23)
CO2: 26 mmol/L (ref 22–32)
Calcium: 8.7 mg/dL — ABNORMAL LOW (ref 8.9–10.3)
Chloride: 104 mmol/L (ref 98–111)
Creatinine, Ser: 0.69 mg/dL (ref 0.44–1.00)
GFR, Estimated: >60 mL/min (ref >60)
Glucose, Bld: 126 mg/dL — ABNORMAL HIGH (ref 70–99)
Potassium: 3.7 mmol/L (ref 3.5–5.1)
Sodium: 138 mmol/L (ref 135–145)

## 2021-08-19 LAB — CBC
HCT: 37.7 % (ref 36.0–46.0)
Hemoglobin: 12.5 g/dL (ref 12.0–15.0)
MCH: 30.7 pg (ref 26.0–34.0)
MCHC: 33.2 g/dL (ref 30.0–36.0)
MCV: 92.6 fL (ref 80.0–100.0)
Platelets: 158 10*3/uL (ref 150–400)
RBC: 4.07 MIL/uL (ref 3.87–5.11)
RDW: 12.7 % (ref 11.5–15.5)
WBC: 5.9 10*3/uL (ref 4.0–10.5)
nRBC: 0 % (ref 0.0–0.2)

## 2021-08-19 LAB — MAGNESIUM: Magnesium: 1.9 mg/dL (ref 1.7–2.4)

## 2021-08-19 MED ORDER — BISACODYL 10 MG RE SUPP
10.0000 mg | Freq: Every day | RECTAL | Status: DC | PRN
  Administered 2021-08-19: 10 mg via RECTAL
  Filled 2021-08-19: qty 1

## 2021-08-19 MED ORDER — GUAIFENESIN 100 MG/5ML PO SOLN: 5.0000 mL | ORAL | Status: DC | PRN

## 2021-08-19 MED ORDER — IPRATROPIUM-ALBUTEROL 0.5-2.5 (3) MG/3ML IN SOLN
3.0000 mL | Freq: Three times a day (TID) | RESPIRATORY_TRACT | Status: DC
  Filled 2021-08-19 (×3): qty 3

## 2021-08-19 MED ORDER — HYDROMORPHONE 1 MG/ML IV SOLN
INTRAVENOUS | Status: DC
Start: 2021-08-19 — End: 2021-08-24
  Administered 2021-08-19: 4.34 mg via INTRAVENOUS
  Administered 2021-08-19: 7.97 mg via INTRAVENOUS
  Administered 2021-08-20: 2.11 mg via INTRAVENOUS
  Administered 2021-08-20: 6.2 mg via INTRAVENOUS
  Administered 2021-08-20: 1 mg via INTRAVENOUS
  Administered 2021-08-20: 17.08 mg via INTRAVENOUS
  Administered 2021-08-20: 5.17 mg via INTRAVENOUS
  Administered 2021-08-20: 5.96 mg via INTRAVENOUS
  Administered 2021-08-20: 3.78 mg via INTRAVENOUS
  Administered 2021-08-21: 3.2 mg via INTRAVENOUS
  Administered 2021-08-21: 2.59 mg via INTRAVENOUS
  Administered 2021-08-21: 4.2 mg via INTRAVENOUS
  Administered 2021-08-21: 2.74 mg via INTRAVENOUS
  Administered 2021-08-21: 4.42 mg via INTRAVENOUS
  Administered 2021-08-21: 4.37 mg via INTRAVENOUS
  Administered 2021-08-22: 2.8 mg via INTRAVENOUS
  Administered 2021-08-22: 6.08 mg via INTRAVENOUS
  Administered 2021-08-22: 6.55 mg via INTRAVENOUS
  Administered 2021-08-22: 3.72 mg via INTRAVENOUS
  Administered 2021-08-22: 0.005 mg via INTRAVENOUS
  Administered 2021-08-22: 3.29 mg via INTRAVENOUS
  Administered 2021-08-22: 1.03 mg via INTRAVENOUS
  Administered 2021-08-23: 4.36 mg via INTRAVENOUS
  Administered 2021-08-23: 5.75 mg via INTRAVENOUS
  Administered 2021-08-23: 3.69 mg via INTRAVENOUS
  Administered 2021-08-23: 3.04 mg via INTRAVENOUS
  Administered 2021-08-23: 4.85 mg via INTRAVENOUS
  Administered 2021-08-24: 1.65 mg via INTRAVENOUS
  Administered 2021-08-24: 1.33 mg via INTRAVENOUS
  Administered 2021-08-24: 2.81 mg via INTRAVENOUS
  Administered 2021-08-24: 3.08 mg via INTRAVENOUS
  Administered 2021-08-24: 2.25 mg via INTRAVENOUS
  Filled 2021-08-19 (×4): qty 30

## 2021-08-19 NOTE — Progress Notes (Signed)
Daily Progress Note   Patient Name: Brittany Mccarty       Date: 08/19/2021 DOB: 11/05/1961  Age: 60 y.o. MRN#: [id number] Attending Physician: Brittany Lack, MD Primary Care Physician: Pcp, No Admit Date: 08/14/2021  Reason for Consultation/Follow-up: Non pain symptom management and Pain control  Subjective:  Awake alert resting in bed. She had vomiting last night. She state she did not rest well. She has pain in mid abdomen and radiating to back. Additionally, she feels pain all throughout her torso today.   PCA pump interrogated: patient has used 9.7 mg of IV Dilaudid since 1546 on 08-18-21 until 9: 30 AM on 08-19-21. She also has transdermal Fentanyl patch 50 mcg, new patch to be applied on 08-20-21.   We talked about pain and non pain symptom management, addressed constipation management as well. See below.   Length of Stay: 4  Current Medications: Scheduled Meds:  . fentaNYL  1 patch Transdermal Q72H  . HYDROmorphone   Intravenous Q4H  . ipratropium-albuterol  3 mL Nebulization TID  . methylnaltrexone  12 mg Subcutaneous Q48H  . osimertinib mesylate  80 mg Oral Daily  . pantoprazole (PROTONIX) IV  40 mg Intravenous Q12H  . senna-docusate  1 tablet Oral QHS    Continuous Infusions: . dextrose 5 % and 0.9% NaCl 100 mL/hr at 08/19/21 0647  . lactated ringers 0 mL (08/15/21 1915)  . promethazine (PHENERGAN) injection (IM or IVPB) 25 mg (08/17/21 1551)    PRN Meds: acetaminophen, benzonatate, bisacodyl, cyclobenzaprine, diphenhydrAMINE **OR** diphenhydrAMINE, guaiFENesin, hydrALAZINE, ipratropium-albuterol, LORazepam, naloxone **AND** sodium chloride flush, ondansetron **OR** ondansetron (ZOFRAN) IV, promethazine (PHENERGAN) injection (IM or IVPB), promethazine-codeine,  traZODone  Physical Exam         Awake alert Resting in bed Appears weak No distress currently Regular work of breathing S 1 S 2  Abdomen mild epigastric tenderness No edema No focal deficits.   Vital Signs: BP (!) 146/95 (BP Location: Left Arm)   Pulse 98   Temp 97.8 F (36.6 C) (Oral)   Resp (!) 24   Ht 5' 5"  (1.651 m)   Wt 78.6 kg   SpO2 97%   BMI 28.84 kg/m  SpO2: SpO2: 97 % O2 Device: O2 Device: Room Air O2 Flow Rate: O2 Flow Rate (L/min): 0 L/min  Intake/output summary:  Intake/Output  Summary (Last 24 hours) at 08/19/2021 1128 Last data filed at 08/19/2021 0602 Gross per 24 hour  Intake 3179.4 ml  Output --  Net 3179.4 ml   LBM: Last BM Date: 08/12/21 Baseline Weight: Weight: 78.6 kg Most recent weight: Weight: 78.6 kg       Palliative Assessment/Data:      Patient Active Problem List   Diagnosis Date Noted  . Protein-calorie malnutrition, severe 08/16/2021  . Chest pain 07/30/2021  . Pneumonitis 07/30/2021  . Dysphagia 07/30/2021  . Bilateral headaches 07/30/2021  . Visual changes 07/30/2021  . Hyponatremia 07/30/2021  . Overweight 07/30/2021  . Cough 07/24/2021  . Hyperkalemia 10/13/2020  . Brain metastases ([place]) 05/13/2020  . Encounter for antineoplastic chemotherapy 05/03/2020  . Goals of care, counseling/discussion 05/03/2020  . Complication of chest tube 04/24/2020  . Adenocarcinoma of lung, right ([place]) 04/24/2020  . Pulmonary embolism ([place]) 04/24/2020  . Pleuritic chest pain 04/24/2020  . Metastatic cancer ([place]) 04/24/2020    Palliative Care Assessment & Plan   Patient Profile:    Assessment:  [name] is a 60 yo woman with metastatic lung cancer diagnosed in 04/2020. Symptoms of disease progression started about 2 months. She is now hospitalized with upper esophageal stricture/external compression and has not been able to take in PO for 2 weeks.  Conservative measures to treat this symptomatically and with dilation have not helped.       PMT following for severe pain-mostly in her back and epigastric area. Other pertinent issues include opioid induced constipation, poor PO intake, likely to have PEG placed soon.    Recommendations/Plan:  1. Uncontrolled, cancer related pain: increase basal rate to 0.2 mg, continue current bolus dose and monitor. Has transdermal fentanyl as well.  X ray abdomen rule out obstruction, then would have Dulcolax rectal suppository, continue Relistor.  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 She is hopeful for getting more targeted therapy and continuing with her cancer treatments.   Goals of Care and Additional Recommendations: Limitations on Scope of Treatment: Full Scope Treatment  Code Status:    Code Status Orders  (From admission, onward)           Start     Ordered   08/14/21 1712  Full code  Continuous        08/14/21 1712           Code Status History     Date Active Date Inactive Code Status Order ID Comments User Context   07/30/2021 0823 07/31/2021 2146 Full Code [id number]  Charlann Lange, MD ED   07/30/2021 0738 07/30/2021 0823 Full Code [id number]  Charlann Lange, MD ED   04/24/2020 0311 04/26/2020 1711 Full Code [id number]  Arlan Organ, DO ED      Advance Directive Documentation    Flowsheet Row Most Recent Value  Type of Advance Directive Healthcare Power of Attorney  Pre-existing out of facility DNR order (yellow form or pink MOST form) --  "MOST" Form in Place? --       Prognosis:  Unable to determine  Discharge Planning: To Be Determined  Care plan was discussed with  patient, bedside RN   Thank you for allowing the Palliative Medicine Team to assist in the care of this patient.   Time In: 9 Time Out: 9.35 Total Time 35 Prolonged Time Billed  no       Greater than 50%  of this time was spent counseling and  coordinating care related to the above assessment and plan.  Loistine Chance, MD  Please contact Palliative Medicine Team phone at [phone] for questions and concerns.

## 2021-08-19 NOTE — Plan of Care (Signed)
  Problem: Pain Managment: Goal: General experience of comfort will improve Outcome: Progressing   Problem: Clinical Measurements: Goal: Ability to maintain clinical measurements within normal limits will improve Outcome: Progressing   

## 2021-08-19 NOTE — Progress Notes (Signed)
PROGRESS NOTE    Brittany Mccarty  OMA:[id number] DOB: July 24, 1961 DOA: 08/14/2021 PCP: Pcp, No   Brief Narrative:  60 y.o. female with history of metastatic lung cancer stage IV on chemotherapy, PE on Eliquis, dysphagia and recent hospitalization from 8/27-8/29 for acute PE and dysphagia returning with progressive dysphagia.  Patient has dysphagia both to solid and liquid, GI team consulted.  Recently during her previous admission she underwent MRI brain which was negative, CTA chest which showed suspected radiation changes in the right hemithorax and a 2.3 cm calcified right peritracheal nodule.  Endoscopy attempted 9/13 but met with some resistance during the passage of the scope.  Case discussed with pulmonary but nothing much to offer at this time.  GI will attempt repeat endoscopy 9/15 with empiric balloon dilatation.   Assessment & Plan:   Active Problems:   Pulmonary embolism (HCC)   Metastatic cancer (HCC)   Dysphagia   Protein-calorie malnutrition, severe  Dysphagia/odynophagia Benign Esophageal Stenosis  Recent MBS unremarkable.  MRI brain and CT head are negative. - Followed by [facility].  Difficult to perform EGD on 9/13 due to resistance and esophageal stenosis.  Repeat EGD performed 9/15 with attempts to dilate the area.  Patient still having difficulty tolerating clears.  She is strongly considering PEG tube at this time and will let us know when she has decided. Path 9/13- showed fibrotic stroma. - Seen by palliative care team.  Currently on PCA pump.   Stage IV lung cancer with brain mets -Followed by Dr. Earlie Server outpatient.  On outpatient [place].  Repeat CT scan showed progression of her disease therefore radiation oncology consulted for radiation.   Wheeze/cough/shortness of breath - Improved but still coughing bringing up thick mucus.  Procalcitonin negative.  Continue bronchodilators 3 times daily, Robitussin liquid as needed.  Incentive spirometer.   History  of recurrent PE - Eliquis is currently on hold in anticipation for any further esophageal dilatation.  If no further procedures planned, should resume   Elevated blood pressure -As needed hydralazine  DVT prophylaxis: Eliquis, SCDs Code Status: Full code Family Communication: None at bedside  Ongoing evaluation for severe dysphagia.  Dispo: The patient is from: Home              Anticipated d/c is to: Home              Patient currently is not medically stable to d/c.   Difficult to place patient No    Subjective: Patient is still having difficulty swallowing any form of liquid.  Coughing up thick mucus.  Remains afebrile.  Pain is better controlled on PCA pump  Review of Systems Otherwise negative except as per HPI, including: General: Denies fever, chills, night sweats or unintended weight loss. Resp: Denies cough, wheezing, shortness of breath. Cardiac: Denies chest pain, palpitations, orthopnea, paroxysmal nocturnal dyspnea. GI: Denies abdominal pain, nausea, vomiting, diarrhea or constipation GU: Denies dysuria, frequency, hesitancy or incontinence MS: Denies muscle aches, joint pain or swelling Neuro: Denies headache, neurologic deficits (focal weakness, numbness, tingling), abnormal gait Psych: Denies anxiety, depression, SI/HI/AVH Skin: Denies new rashes or lesions ID: Denies sick contacts, exotic exposures, travel  Examination:  Constitutional: Not in acute distress Respiratory: Clear to auscultation bilaterally Cardiovascular: Normal sinus rhythm, no rubs Abdomen: Nontender nondistended good bowel sounds Musculoskeletal: No edema noted Skin: No rashes seen Neurologic: CN 2-12 grossly intact.  And nonfocal Psychiatric: Normal judgment and insight. Alert and oriented x 3. Normal mood.   Objective: Vitals:  08/19/21 0256 08/19/21 0442 08/19/21 0828 08/19/21 0859  BP: (!) 138/94 (!) 150/93    Pulse: 92 96    Resp: _0 Temp:  98.4 F (36.9 C)     TempSrc:  Oral    SpO2: 99% 97% 93% 100%  Weight:      Height:        Intake/Output Summary (Last 24 hours) at 08/19/2021 0916 Last data filed at 08/19/2021 0602 Gross per 24 hour  Intake 3179.4 ml  Output --  Net 3179.4 ml   Filed Weights   08/15/21 0406 08/15/21 1222  Weight: 78.6 kg 78.6 kg     Data Reviewed:   CBC: Recent Labs  Lab 08/14/21 1258 08/15/21 0334 08/16/21 0342 08/17/21 0340 08/18/21 0337 08/18/21 0940 08/19/21 0346  WBC 6.8   < > 6.1 6.1 4.6 6.1 5.9  NEUTROABS 5.6  --   --   --   --  5.2  --   HGB 15.4*   < > 13.0 14.5 12.2 13.2 12.5  HCT 49.5*   < > 39.6 43.8 36.2 39.5 37.7  MCV 99.8   < > 92.5 93.6 91.4 91.0 92.6  PLT 153   < > 164 160 141* 154 158   < > = values in this interval not displayed.   Basic Metabolic Panel: Recent Labs  Lab 08/16/21 0342 08/17/21 0340 08/18/21 0337 08/18/21 0940 08/19/21 0346  NA 142 139 136 141 138  K 4.5 3.5 3.1* 3.5 3.7  CL 107 103 104 104 104  CO2 _1 GLUCOSE 120* 99 103* 100* 126*  BUN 5* <5* <5* <4* <5*  CREATININE 0.79 0.92 0.79 0.79 0.69  CALCIUM 9.5 9.0 8.4* 9.4 8.7*  MG 2.2 1.9 1.7  --  1.9   GFR: Estimated Creatinine Clearance: 76.5 mL/min (by C-G formula based on SCr of 0.69 mg/dL). Liver Function Tests: Recent Labs  Lab 08/14/21 1407 08/18/21 0940  AST 26 16  ALT 16 8  ALKPHOS 98 126  BILITOT 1.0 0.6  PROT 7.1 6.3*  ALBUMIN 3.9 3.1*   Recent Labs  Lab 08/14/21 1408  LIPASE 30   No results for input(s): AMMONIA in the last 168 hours. Coagulation Profile: No results for input(s): INR, PROTIME in the last 168 hours. Cardiac Enzymes: No results for input(s): CKTOTAL, CKMB, CKMBINDEX, TROPONINI in the last 168 hours. BNP (last 3 results) No results for input(s): PROBNP in the last 8760 hours. HbA1C: No results for input(s): HGBA1C in the last 72 hours. CBG: Recent Labs  Lab 08/17/21 1649 08/18/21 1556  GLUCAP 102* 104*   Lipid Profile: No results for  input(s): CHOL, HDL, LDLCALC, TRIG, CHOLHDL, LDLDIRECT in the last 72 hours. Thyroid Function Tests: No results for input(s): TSH, T4TOTAL, FREET4, T3FREE, THYROIDAB in the last 72 hours.  Anemia Panel: No results for input(s): VITAMINB12, FOLATE, FERRITIN, TIBC, IRON, RETICCTPCT in the last 72 hours. Sepsis Labs: Recent Labs  Lab 08/17/21 0846  PROCALCITON <0.10    Recent Results (from the past 240 hour(s))  Resp Panel by RT-PCR (Flu A&B, Covid) Nasopharyngeal Swab     Status: None   Collection Time: 08/14/21  3:22 PM   Specimen: Nasopharyngeal Swab; Nasopharyngeal(NP) swabs in vial transport medium  Result Value Ref Range Status   SARS Coronavirus 2 by RT PCR NEGATIVE NEGATIVE Final    Comment: (NOTE) SARS-CoV-2 target nucleic acids are NOT DETECTED.  The SARS-CoV-2 RNA is generally detectable in  upper respiratory specimens during the acute phase of infection. The lowest concentration of SARS-CoV-2 viral copies this assay can detect is 138 copies/mL. A negative result does not preclude SARS-Cov-2 infection and should not be used as the sole basis for treatment or other patient management decisions. A negative result may occur with  improper specimen collection/handling, submission of specimen other than nasopharyngeal swab, presence of viral mutation(s) within the areas targeted by this assay, and inadequate number of viral copies(<138 copies/mL). A negative result must be combined with clinical observations, patient history, and epidemiological information. The expected result is Negative.  Fact Sheet for Patients:  EntrepreneurPulse.com.au  Fact Sheet for Healthcare Providers:  IncredibleEmployment.be  This test is no t yet approved or cleared by the [place] FDA and  has been authorized for detection and/or diagnosis of SARS-CoV-2 by FDA under an Emergency Use Authorization (EUA). This EUA will remain  in effect (meaning this  test can be used) for the duration of the COVID-19 declaration under Section 564(b)(1) of the Act, 21 U.S.C.section 360bbb-3(b)(1), unless the authorization is terminated  or revoked sooner.       Influenza A by PCR NEGATIVE NEGATIVE Final   Influenza B by PCR NEGATIVE NEGATIVE Final    Comment: (NOTE) The Xpert Xpress SARS-CoV-2/FLU/RSV plus assay is intended as an aid in the diagnosis of influenza from Nasopharyngeal swab specimens and should not be used as a sole basis for treatment. Nasal washings and aspirates are unacceptable for Xpert Xpress SARS-CoV-2/FLU/RSV testing.  Fact Sheet for Patients: EntrepreneurPulse.com.au  Fact Sheet for Healthcare Providers: IncredibleEmployment.be  This test is not yet approved or cleared by the [place] FDA and has been authorized for detection and/or diagnosis of SARS-CoV-2 by FDA under an Emergency Use Authorization (EUA). This EUA will remain in effect (meaning this test can be used) for the duration of the COVID-19 declaration under Section 564(b)(1) of the Act, 21 U.S.C. section 360bbb-3(b)(1), unless the authorization is terminated or revoked.  Performed at [facility], [place] [address], [place], [place] [address]          Radiology Studies: No results found.      Scheduled Meds:  fentaNYL  1 patch Transdermal Q72H   HYDROmorphone   Intravenous Q4H   ipratropium-albuterol  3 mL Nebulization TID   methylnaltrexone  12 mg Subcutaneous Q48H   osimertinib mesylate  80 mg Oral Daily   pantoprazole (PROTONIX) IV  40 mg Intravenous Q12H   senna-docusate  1 tablet Oral QHS   Continuous Infusions:  dextrose 5 % and 0.9% NaCl 100 mL/hr at 08/19/21 0647   lactated ringers 0 mL (08/15/21 1915)   promethazine (PHENERGAN) injection (IM or IVPB) 25 mg (08/17/21 1551)     LOS: 4 days   Time spent= 35 mins    Lilyana Lippman Arsenio Loader, MD [facility]  If  7PM-7AM, please contact night-coverage  08/19/2021, 9:16 AM

## 2021-08-20 ENCOUNTER — Inpatient Hospital Stay (HOSPITAL_COMMUNITY): Payer: 59

## 2021-08-20 DIAGNOSIS — E43 Unspecified severe protein-calorie malnutrition: Secondary | ICD-10-CM | POA: Diagnosis not present

## 2021-08-20 DIAGNOSIS — R1312 Dysphagia, oropharyngeal phase: Secondary | ICD-10-CM | POA: Diagnosis not present

## 2021-08-20 DIAGNOSIS — C799 Secondary malignant neoplasm of unspecified site: Secondary | ICD-10-CM | POA: Diagnosis not present

## 2021-08-20 DIAGNOSIS — C3491 Malignant neoplasm of unspecified part of right bronchus or lung: Secondary | ICD-10-CM | POA: Diagnosis not present

## 2021-08-20 DIAGNOSIS — Z515 Encounter for palliative care: Secondary | ICD-10-CM | POA: Diagnosis not present

## 2021-08-20 LAB — BASIC METABOLIC PANEL
Anion gap: 8 (ref 5–15)
BUN: <5 mg/dL — ABNORMAL LOW (ref 8–23)
CO2: 26 mmol/L (ref 22–32)
Calcium: 8.6 mg/dL — ABNORMAL LOW (ref 8.9–10.3)
Chloride: 107 mmol/L (ref 98–111)
Creatinine, Ser: 0.57 mg/dL (ref 0.44–1.00)
GFR, Estimated: >60 mL/min (ref >60)
Glucose, Bld: 111 mg/dL — ABNORMAL HIGH (ref 70–99)
Potassium: 3.6 mmol/L (ref 3.5–5.1)
Sodium: 141 mmol/L (ref 135–145)

## 2021-08-20 LAB — CBC
HCT: 39.3 % (ref 36.0–46.0)
Hemoglobin: 12.8 g/dL (ref 12.0–15.0)
MCH: 30.8 pg (ref 26.0–34.0)
MCHC: 32.6 g/dL (ref 30.0–36.0)
MCV: 94.5 fL (ref 80.0–100.0)
Platelets: 124 10*3/uL — ABNORMAL LOW (ref 150–400)
RBC: 4.16 MIL/uL (ref 3.87–5.11)
RDW: 12.9 % (ref 11.5–15.5)
WBC: 4.8 10*3/uL (ref 4.0–10.5)
nRBC: 0 % (ref 0.0–0.2)

## 2021-08-20 LAB — GLUCOSE, CAPILLARY: Glucose-Capillary: 127 mg/dL — ABNORMAL HIGH (ref 70–99)

## 2021-08-20 LAB — MAGNESIUM: Magnesium: 1.8 mg/dL (ref 1.7–2.4)

## 2021-08-20 MED ORDER — CLONIDINE HCL 0.2 MG PO TABS
0.2000 mg | ORAL_TABLET | Freq: Two times a day (BID) | ORAL | Status: DC
  Filled 2021-08-20: qty 1

## 2021-08-20 MED ORDER — DEXAMETHASONE SODIUM PHOSPHATE 10 MG/ML IJ SOLN
8.0000 mg | Freq: Once | INTRAMUSCULAR | Status: AC
  Administered 2021-08-20: 8 mg via INTRAVENOUS
  Filled 2021-08-20: qty 1

## 2021-08-20 MED ORDER — HYDROMORPHONE HCL 1 MG/ML IJ SOLN
1.0000 mg | Freq: Once | INTRAMUSCULAR | Status: AC
Start: 2021-08-20 — End: 2021-08-20
  Administered 2021-08-20: 1 mg via INTRAVENOUS
  Filled 2021-08-20: qty 1

## 2021-08-20 MED ORDER — ENOXAPARIN SODIUM 40 MG/0.4ML IJ SOSY
40.0000 mg | PREFILLED_SYRINGE | INTRAMUSCULAR | Status: DC
  Administered 2021-08-20 – 2021-08-21 (×2): 40 mg via SUBCUTANEOUS
  Filled 2021-08-20 (×2): qty 0.4

## 2021-08-20 NOTE — Progress Notes (Signed)
   08/20/21 1621  Assess: MEWS Score  Temp (!) 97.5 F (36.4 C)  BP (!) 182/112  Pulse Rate (!) 115  Resp (!) 27  SpO2 99 %  O2 Device Nasal Cannula  O2 Flow Rate (L/min) 2 L/min  Assess: MEWS Score  MEWS Temp 0  MEWS Systolic 0  MEWS Pulse 2  MEWS RR 2  MEWS LOC 0  MEWS Score 4  MEWS Score Color Red  Assess: if the MEWS score is Yellow or Red  Were vital signs taken at a resting state? Yes  Focused Assessment Change from prior assessment (see assessment flowsheet)  Does the patient meet 2 or more of the SIRS criteria? Yes  Does the patient have a confirmed or suspected source of infection? No  MEWS guidelines implemented *See Row Information* Yes  Treat  MEWS Interventions Administered prn meds/treatments  Pain Scale 0-10  Pain Score 3  Pain Type Chronic pain  Pain Location Generalized  Pain Orientation Right;Left  Pain Descriptors / Indicators Aching  Pain Frequency Constant  Pain Onset On-going  Patients Stated Pain Goal 2  Pain Intervention(s) Medication (See eMAR)  Take Vital Signs  Increase Vital Sign Frequency  Red: Q 1hr X 4 then Q 4hr X 4, if remains red, continue Q 4hrs  Escalate  MEWS: Escalate Red: discuss with charge nurse/RN and provider, consider discussing with RRT  Notify: Charge Nurse/RN  Name of Charge Nurse/RN Notified Jessica, RN  Date Charge Nurse/RN Notified 08/20/21  Time Charge Nurse/RN Notified 1623  Notify: Provider  Provider Name/Title Arrien, MD  Date Provider Notified 08/20/21  Time Provider Notified 1624  Notification Type Page  Notification Reason Change in status  Provider response Other (Comment) (awaiting new orders)  Assess: SIRS CRITERIA  SIRS Temperature  0  SIRS Pulse 1  SIRS Respirations  1  SIRS WBC 0  SIRS Score Sum  2  MD and rapid response notified. One time order 1mg  dilaudid ordered. Will continue to monitor

## 2021-08-20 NOTE — Progress Notes (Signed)
Daily Progress Note   Patient Name: Alexei Ey       Date: 08/20/2021 DOB: 1961-09-19  Age: 60 y.o. MRN#: [id number] Attending Physician: Tawni Millers Primary Care Physician: Pcp, No Admit Date: 08/14/2021  Reason for Consultation/Follow-up: Non pain symptom management and Pain control  Subjective:  Awakens easily but is more sleepy this morning than yesterday, she tells me that her PCA was off for some time last night and then it was restarted. She has used 17 mg total IV Dilaudid in the past 24 hours. She is arousable and answers questions appropriately. She did have some vomiting overnight as well as this morning. She did have a bowel movement. Appreciate bedside RN. Continue to monitor pain control today.    We talked about pain and non pain symptom management, addressed constipation management as well. See below.   Length of Stay: 5  Current Medications: Scheduled Meds:  . fentaNYL  1 patch Transdermal Q72H  . HYDROmorphone   Intravenous Q4H  . ipratropium-albuterol  3 mL Nebulization TID  . methylnaltrexone  12 mg Subcutaneous Q48H  . osimertinib mesylate  80 mg Oral Daily  . pantoprazole (PROTONIX) IV  40 mg Intravenous Q12H  . senna-docusate  1 tablet Oral QHS    Continuous Infusions: . dextrose 5 % and 0.9% NaCl 100 mL/hr at 08/20/21 1114  . lactated ringers 0 mL (08/15/21 1915)  . promethazine (PHENERGAN) injection (IM or IVPB) 25 mg (08/17/21 1551)    PRN Meds: acetaminophen, benzonatate, bisacodyl, cyclobenzaprine, diphenhydrAMINE **OR** diphenhydrAMINE, guaiFENesin, hydrALAZINE, ipratropium-albuterol, LORazepam, naloxone **AND** sodium chloride flush, ondansetron **OR** ondansetron (ZOFRAN) IV, promethazine (PHENERGAN) injection (IM or IVPB),  promethazine-codeine, traZODone  Physical Exam         Awakens easily Resting in bed Appears weak No distress currently Regular work of breathing S 1 S 2  Abdomen mild epigastric tenderness No edema No focal deficits.   Vital Signs: BP (!) 150/104 (BP Location: Left Arm)   Pulse (!) 110   Temp 98.5 F (36.9 C) (Oral)   Resp (!) 26   Ht 5' 5"  (1.651 m)   Wt 78.6 kg   SpO2 99%   BMI 28.84 kg/m  SpO2: SpO2: 99 % O2 Device: O2 Device: Nasal Cannula O2 Flow Rate: O2 Flow Rate (L/min): 2 L/min  Intake/output summary:  Intake/Output Summary (Last 24 hours) at 08/20/2021 1123 Last data filed at 08/20/2021 0600 Gross per 24 hour  Intake 2363.4 ml  Output --  Net 2363.4 ml    LBM: Last BM Date: 08/19/21 Baseline Weight: Weight: 78.6 kg Most recent weight: Weight: 78.6 kg       Palliative Assessment/Data:      Patient Active Problem List   Diagnosis Date Noted  . Palliative care by specialist   . Protein-calorie malnutrition, severe 08/16/2021  . Chest pain 07/30/2021  . Pneumonitis 07/30/2021  . Dysphagia 07/30/2021  . Bilateral headaches 07/30/2021  . Visual changes 07/30/2021  . Hyponatremia 07/30/2021  . Overweight 07/30/2021  . Cough 07/24/2021  . Hyperkalemia 10/13/2020  . Brain metastases ([place]) 05/13/2020  . Encounter for antineoplastic chemotherapy 05/03/2020  . Goals of care, counseling/discussion 05/03/2020  . Complication of chest tube 04/24/2020  . Adenocarcinoma of lung, right ([place]) 04/24/2020  . Pulmonary embolism ([place]) 04/24/2020  . Pleuritic chest pain 04/24/2020  . Metastatic cancer ([place]) 04/24/2020    Palliative Care Assessment & Plan   Patient Profile:    Assessment:  Abi is a 60 yo woman with metastatic lung cancer diagnosed in 04/2020. Symptoms of disease progression started about 2 months. She is now hospitalized with upper esophageal stricture/external compression and has not been able to take in PO for 2 weeks.  Conservative  measures to treat this symptomatically and with dilation have not helped.      PMT following for severe pain-mostly in her back and epigastric area. Other pertinent issues include opioid induced constipation, poor PO intake, likely to have PEG placed soon.    Recommendations/Plan:  1. Uncontrolled, cancer related pain: increase basal rate to 0.2 mg, continue current bolus dose and monitor. Might adjust PCA bolus from Q1 5 minutes to Q 20 minutes if appropriate. Has transdermal fentanyl as well.  Continue current bowel regimen, she did have small bowel movement on 08-19-21.   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 She is hopeful for getting more targeted therapy and continuing with her cancer treatments.   Goals of Care and Additional Recommendations: Limitations on Scope of Treatment: Full Scope Treatment  Code Status:    Code Status Orders  (From admission, onward)           Start     Ordered   08/14/21 1712  Full code  Continuous        08/14/21 1712           Code Status History     Date Active Date Inactive Code Status Order ID Comments User Context   07/30/2021 0823 07/31/2021 2146 Full Code [id number]  Charlann Lange, MD ED   07/30/2021 0738 07/30/2021 0823 Full Code [id number]  Charlann Lange, MD ED   04/24/2020 0311 04/26/2020 1711 Full Code [id number]  Arlan Organ, DO ED      Advance Directive Documentation    Flowsheet Row Most Recent Value  Type of Advance Directive Healthcare Power of Attorney  Pre-existing out of facility DNR order (yellow form or pink MOST form) --  "MOST" Form in Place? --       Prognosis:  Unable to determine  Discharge Planning: To Be Determined  Care plan was discussed with  patient, bedside RN   Thank you for allowing the Palliative Medicine Team to  assist in the care of this patient.   Time  In: 9 Time Out: 9.35 Total Time 35 Prolonged Time Billed  no       Greater than 50%  of this time was spent counseling and coordinating care related to the above assessment and plan.  Loistine Chance, MD  Please contact Palliative Medicine Team phone at [phone] for questions and concerns.

## 2021-08-20 NOTE — Progress Notes (Signed)
PROGRESS NOTE    Brittany Mccarty  JWJ:[id number] DOB: 1961/03/09 DOA: 08/14/2021 PCP: Pcp, No    Brief Narrative:  Brittany Mccarty has admitted to the hospital with the working diagnosis of esophageal stenosis, due to radiation induced stricture in the setting of Stage IV Lung Cancer.   60 year old female with past medical history for metastatic lung cancer, pulmonary embolism, who presented with progressive dysphagia.  Worsening symptoms to the point where difficulty swallowing medications.  Positive wheezing, dyspnea and productive cough. Recent hospitalization 8/20 7-07/2028 for acute pulm embolism and dysphagia. On her initial physical examination on this hospitalization his blood pressure was 146/82, heart rate 116, respiratory rate 28-33, temperature 98.3, oxygen saturation 99%, her lungs had no wheezing or rales, heart S1-S2, present, rhythmic, soft abdomen, no lower extremity edema.  Sodium 136, potassium 4.2, chloride 98, bicarb 27, glucose 101, BUN 11, creatinine 0.99, AST 26, ALT 16, total bilirubin 1.0, white count 6.8, hemoglobin 15.4, hematocrit 49.5, platelets 153. SARS COVID-19 was negative.  Urinalysis specific gravity <1.005  Chest radiograph with right lower lobe opacity, positive volume loss and mediastinal retraction to the right side.  Patient underwent endoscopy 9/13 which failed to unable to pass scope. 9/15 repeat endoscopy and esophageal dilatation.  She has had persistent pain and she has been placed on a PCA pump. The recommendation for a feeding tube has been made, patient has not decided yet about this intervention.   Assessment & Plan:   Principal Problem:   Dysphagia Active Problems:   Adenocarcinoma of lung, right (HCC)   Pulmonary embolism (HCC)   Metastatic cancer (HCC)   Protein-calorie malnutrition, severe   Palliative care by specialist   Esophageal stenosis, due external compression, progressive lung cancer. Patient sp esophageal  dilatation, but continue to have dysphagia and odynophagia, not tolerating po medications or liquids.  Continue to have severe pain, requiring PCA (hydromorphone).  Esophageal biopsy with fibrotic stroma but no intrinsic malignancy.   Today she is in agreement in getting feeding tube in order to have enteral medications and nutrition.   2. 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 Brain metastasis.  Outpatient palliative treatment with Tagriso (started 04/2020) and brain radiation (completed 06/22).  CT chest 08/17/21 with tumor progression in the posterior right lower lobe   Patient will need bronchoscopy per Dr Lamonte Sakai for confirmation of disease progression.   3. Hx of pulmonary embolism. Patient with persistent cough and hemoptysis. Will continue to hold on anticoagulation due to risk of airway bleeding. Continue supportive medical therapy and follow up with Pulmonary recommendations in regards of bronchoscopy.   4. HTN. Blood pressure continue to be elevated but not able to use oral antihypertensive medications Continue with as needed hydralazine   5. Severe calorie protein malnutrition. Patient not tolerating po, rapid and progressive weakness. She is in agreement in getting PEG tube for nutrition.   Patient continue to be at high risk for worsening esophageal obstruction.   Status is: Inpatient  Remains inpatient appropriate because:Inpatient level of care appropriate due to severity of illness  Dispo: The patient is from: Home              Anticipated d/c is to: Home              Patient currently is not medically stable to d/c.   Difficult to place patient No   DVT prophylaxis: Enoxaparin   Code Status:    full  Family Communication:  I spoke with patient's daughter at the bedside, we talked in detail about patient's condition, plan of care and prognosis and all questions were addressed.    Nutrition  Status: Nutrition Problem: Severe Malnutrition Etiology: acute illness, dysphagia Signs/Symptoms: percent weight loss, energy intake < or equal to 50% for > or equal to 5 days Percent weight loss: 7 % Interventions: Refer to RD note for recommendations    Consultants:  Oncology  GI  Pulmonary   Procedures:  Esophageal dilatation   Subjective: Patient continue to have chest pain, requiring PCa pump for pain control, not tolerating liquids or solids, immediate vomiting. No headache.   Objective: Vitals:   08/20/21 0615 08/20/21 0812 08/20/21 0826 08/20/21 0913  BP: (!) 134/102  (!) 151/94 (!) 161/97  Pulse: (!) 110  (!) 111 (!) 113  Resp: 20 17 16  (!) 21  Temp:   98.7 F (37.1 C) 98.3 F (36.8 C)  TempSrc:   Oral Oral  SpO2: 92% 96% 96% 95%  Weight:      Height:        Intake/Output Summary (Last 24 hours) at 08/20/2021 1117 Last data filed at 08/20/2021 0600 Gross per 24 hour  Intake 2363.4 ml  Output --  Net 2363.4 ml   Filed Weights   08/15/21 0406 08/15/21 1222  Weight: 78.6 kg 78.6 kg    Examination:   General: Not in pain or dyspnea  Neurology: Awake and alert, non focal  E ENT: positive pallor, no icterus, oral mucosa moist Cardiovascular: No JVD. S1-S2 present, rhythmic, no gallops, rubs, or murmurs. No lower extremity edema. Pulmonary: positive breath sounds bilaterally, with  no wheezing, scattered rhonchi or rales. Gastrointestinal. Abdomen soft and non tender Skin. No rashes Musculoskeletal: no joint deformities     Data Reviewed: I have personally reviewed following labs and imaging studies  CBC: Recent Labs  Lab 08/14/21 1258 08/15/21 0334 08/17/21 0340 08/18/21 0337 08/18/21 0940 08/19/21 0346 08/20/21 0346  WBC 6.8   < > 6.1 4.6 6.1 5.9 4.8  NEUTROABS 5.6  --   --   --  5.2  --   --   HGB 15.4*   < > 14.5 12.2 13.2 12.5 12.8  HCT 49.5*   < > 43.8 36.2 39.5 37.7 39.3  MCV 99.8   < > 93.6 91.4 91.0 92.6 94.5  PLT 153   < > 160  141* 154 158 124*   < > = values in this interval not displayed.   Basic Metabolic Panel: Recent Labs  Lab 08/16/21 0342 08/17/21 0340 08/18/21 0337 08/18/21 0940 08/19/21 0346 08/20/21 0346  NA 142 139 136 141 138 141  K 4.5 3.5 3.1* 3.5 3.7 3.6  CL 107 103 104 104 104 107  CO2 28 27 26 27 26 26   GLUCOSE 120* 99 103* 100* 126* 111*  BUN 5* <5* <5* <4* <5* <5*  CREATININE 0.79 0.92 0.79 0.79 0.69 0.57  CALCIUM 9.5 9.0 8.4* 9.4 8.7* 8.6*  MG 2.2 1.9 1.7  --  1.9 1.8   GFR: Estimated Creatinine Clearance: 76.5 mL/min (by C-G formula based on SCr of 0.57 mg/dL). Liver Function Tests: Recent Labs  Lab 08/14/21 1407 08/18/21 0940  AST 26 16  ALT 16 8  ALKPHOS 98 126  BILITOT 1.0 0.6  PROT 7.1 6.3*  ALBUMIN 3.9 3.1*   Recent Labs  Lab 08/14/21 1408  LIPASE 30   No results for input(s): AMMONIA in the last 168  hours. Coagulation Profile: No results for input(s): INR, PROTIME in the last 168 hours. Cardiac Enzymes: No results for input(s): CKTOTAL, CKMB, CKMBINDEX, TROPONINI in the last 168 hours. BNP (last 3 results) No results for input(s): PROBNP in the last 8760 hours. HbA1C: No results for input(s): HGBA1C in the last 72 hours. CBG: Recent Labs  Lab 08/17/21 1649 08/18/21 1556 08/20/21 0915  GLUCAP 102* 104* 127*   Lipid Profile: No results for input(s): CHOL, HDL, LDLCALC, TRIG, CHOLHDL, LDLDIRECT in the last 72 hours. Thyroid Function Tests: No results for input(s): TSH, T4TOTAL, FREET4, T3FREE, THYROIDAB in the last 72 hours. Anemia Panel: No results for input(s): VITAMINB12, FOLATE, FERRITIN, TIBC, IRON, RETICCTPCT in the last 72 hours.    Radiology Studies: I have reviewed all of the imaging during this hospital visit personally     Scheduled Meds:  fentaNYL  1 patch Transdermal Q72H   HYDROmorphone   Intravenous Q4H   ipratropium-albuterol  3 mL Nebulization TID   methylnaltrexone  12 mg Subcutaneous Q48H   osimertinib mesylate  80 mg  Oral Daily   pantoprazole (PROTONIX) IV  40 mg Intravenous Q12H   senna-docusate  1 tablet Oral QHS   Continuous Infusions:  dextrose 5 % and 0.9% NaCl 100 mL/hr at 08/20/21 1114   lactated ringers 0 mL (08/15/21 1915)   promethazine (PHENERGAN) injection (IM or IVPB) 25 mg (08/17/21 1551)     LOS: 5 days        Tamu Golz Gerome Apley, MD

## 2021-08-21 ENCOUNTER — Encounter: Payer: Self-pay | Admitting: *Deleted

## 2021-08-21 ENCOUNTER — Inpatient Hospital Stay: Payer: 59 | Admitting: Internal Medicine

## 2021-08-21 ENCOUNTER — Encounter (HOSPITAL_COMMUNITY): Payer: Self-pay | Admitting: Internal Medicine

## 2021-08-21 ENCOUNTER — Inpatient Hospital Stay (HOSPITAL_COMMUNITY): Payer: 59

## 2021-08-21 ENCOUNTER — Ambulatory Visit
Admission: RE | Admit: 2021-08-21 | Discharge: 2021-08-21 | Disposition: A | Discharge status: Home / Self Care | Payer: 59 | Source: Ambulatory Visit | Attending: Radiation Oncology | Admitting: Radiation Oncology

## 2021-08-21 ENCOUNTER — Inpatient Hospital Stay: Payer: 59

## 2021-08-21 DIAGNOSIS — C3491 Malignant neoplasm of unspecified part of right bronchus or lung: Secondary | ICD-10-CM | POA: Diagnosis not present

## 2021-08-21 DIAGNOSIS — R1312 Dysphagia, oropharyngeal phase: Secondary | ICD-10-CM | POA: Diagnosis not present

## 2021-08-21 LAB — BASIC METABOLIC PANEL
Anion gap: 7 (ref 5–15)
BUN: 5 mg/dL — ABNORMAL LOW (ref 8–23)
CO2: 29 mmol/L (ref 22–32)
Calcium: 9 mg/dL (ref 8.9–10.3)
Chloride: 105 mmol/L (ref 98–111)
Creatinine, Ser: 0.7 mg/dL (ref 0.44–1.00)
GFR, Estimated: >60 mL/min (ref >60)
Glucose, Bld: 161 mg/dL — ABNORMAL HIGH (ref 70–99)
Potassium: 3.6 mmol/L (ref 3.5–5.1)
Sodium: 141 mmol/L (ref 135–145)

## 2021-08-21 MED ORDER — FUROSEMIDE 10 MG/ML IJ SOLN
20.0000 mg | Freq: Once | INTRAMUSCULAR | Status: AC
  Administered 2021-08-21: 20 mg via INTRAVENOUS
  Filled 2021-08-21: qty 2

## 2021-08-21 MED ORDER — LABETALOL HCL 5 MG/ML IV SOLN
5.0000 mg | Freq: Once | INTRAVENOUS | Status: AC
  Administered 2021-08-21: 5 mg via INTRAVENOUS
  Filled 2021-08-21: qty 4

## 2021-08-21 MED ORDER — DEXAMETHASONE SODIUM PHOSPHATE 4 MG/ML IJ SOLN
4.0000 mg | INTRAMUSCULAR | Status: DC
  Administered 2021-08-21 – 2021-08-30 (×10): 4 mg via INTRAVENOUS
  Filled 2021-08-21 (×10): qty 1

## 2021-08-21 MED ORDER — ENOXAPARIN SODIUM 40 MG/0.4ML IJ SOSY
40.0000 mg | PREFILLED_SYRINGE | INTRAMUSCULAR | Status: DC
  Administered 2021-08-24: 40 mg via SUBCUTANEOUS
  Filled 2021-08-21: qty 0.4

## 2021-08-21 MED ORDER — HYDRALAZINE HCL 20 MG/ML IJ SOLN
10.0000 mg | Freq: Four times a day (QID) | INTRAMUSCULAR | Status: DC | PRN
  Administered 2021-08-23 – 2021-08-25 (×2): 10 mg via INTRAVENOUS
  Filled 2021-08-21 (×2): qty 1

## 2021-08-21 MED ORDER — VANCOMYCIN HCL IN DEXTROSE 1-5 GM/200ML-% IV SOLN
1000.0000 mg | INTRAVENOUS | Status: AC
  Administered 2021-08-23: 1000 mg via INTRAVENOUS
  Filled 2021-08-21: qty 200

## 2021-08-21 NOTE — H&P (Addendum)
Chief Complaint: Patient was seen in consultation today for gastrostomy tube placement    Referring Physician(s): Dr. Lurline Del  Supervising Physician: Daryll Brod  Patient Status: Brittany Mccarty - In-pt  History of Present Illness:  Brittany Mccarty is a 60 y.o. female with PMH of metastatic lung cancer, PE and dysphagia with protein calorie malnutrition.  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 Pt states she has had dilatation attempts x2 that have failed. Endoscopy 08/15/21 showed possible extrinsic compression. CT 08/16/21 showed likely tumor progression in posterior right lower lobe. She is unable to swallow solids, fluids or saliva. She states she is constantly spitting or vomiting d/t dysphasia. She also complains of chest pressure, back pain and DOE. Pt currently on PCA for pain control. Dr. Cathlean Sauer referred pt to IR for evaluation and placement gastrostomy tube for nutrition.    Past Medical History:  Diagnosis Date   NSCL CA DX'D 04/07/20    Past Surgical History:  Procedure Laterality Date   BIOPSY  08/15/2021   Procedure: BIOPSY;  Surgeon: [name], MD;  Location: [facility];  Service: Gastroenterology;;   ESOPHAGOGASTRODUODENOSCOPY (EGD) WITH PROPOFOL N/A 08/15/2021   Procedure: ESOPHAGOGASTRODUODENOSCOPY (EGD) WITH PROPOFOL;  Surgeon: [name], MD;  Location: [facility];  Service: Gastroenterology;  Laterality: N/A;   ESOPHAGOGASTRODUODENOSCOPY (EGD) WITH PROPOFOL N/A 08/17/2021   Procedure: ESOPHAGOGASTRODUODENOSCOPY (EGD) WITH PROPOFOL;  Surgeon: [name], MD;  Location: [facility];  Service: Gastroenterology;  Laterality: N/A;   IR REMOVAL OF PLURAL CATH W/CUFF  04/24/2020    Allergies: Other and Cefdinir  Medications: Prior to Admission medications   Medication Sig Start Date End Date Taking? Authorizing Provider  apixaban (ELIQUIS) 5 MG TABS tablet Take 2 tablets (10 mg total) by mouth 2 (two) times daily  for 7 days, THEN 1 tablet (5 mg total) 2 (two) times daily for 28 days. Patient taking differently: 10mg  orally daily currently 07/31/21 09/04/21 Yes Sheikh, Omair Latif, DO  benzonatate (TESSALON) 100 MG capsule Take 1 capsule (100 mg total) by mouth 3 (three) times daily as needed for cough. 07/28/21  Yes Heilingoetter, Cassandra L, PA-C  cyclobenzaprine (FLEXERIL) 10 MG tablet Take 1 tablet (10 mg total) by mouth 2 (two) times daily as needed for muscle spasms. 07/30/21  Yes McDonald, Mia A, PA-C  fentaNYL (DURAGESIC) 50 MCG/HR Place 1 patch onto the skin every 3 (three) days. 08/11/21  Yes Heilingoetter, Cassandra L, PA-C  omeprazole (PRILOSEC) 20 MG capsule Take 1 capsule (20 mg total) by mouth daily. 07/24/21  Yes Heilingoetter, Cassandra L, PA-C  ondansetron (ZOFRAN) 4 MG tablet Take 4 mg by mouth every 8 (eight) hours as needed for nausea or vomiting. 04/08/20  Yes [provider]  osimertinib mesylate (TAGRISSO) 80 MG tablet Take 1 tablet (80 mg total) by mouth daily. 06/08/21  Yes Curt Bears, MD  oxyCODONE (OXY IR/ROXICODONE) 5 MG immediate release tablet Take 1 tablet (5 mg total) by mouth 3 (three) times daily between meals as needed for severe pain. 07/31/21  Yes Heilingoetter, Cassandra L, PA-C  promethazine-codeine (PHENERGAN WITH CODEINE) 6.25-10 MG/5ML syrup Take 5-10 mLs by mouth every 6 (six) hours as needed for cough. 08/11/21  Yes Collene Gobble, MD  lidocaine (XYLOCAINE) 2 % solution Use as directed 15 mLs in the mouth or throat as needed for mouth pain. Patient not taking: No sig reported 07/30/21   McDonald, Mia A, PA-C  predniSONE (DELTASONE) 20 MG tablet Take 4 tablets (80 mg) daily for  1 week, followed by 2 tablets (40 mg) daily for 1 week, 1 tablet (20 mg) for 1 week. Patient not taking: Reported on 08/14/2021 07/24/21   Heilingoetter, Cassandra L, PA-C     History reviewed. No pertinent family history.  Social History   Socioeconomic History   Marital status: Married     Spouse name: Not on file   Number of children: Not on file   Years of education: Not on file   Highest education level: Not on file  Occupational History   Not on file  Tobacco Use   Smoking status: Never   Smokeless tobacco: Never  Substance and Sexual Activity   Alcohol use: Not Currently   Drug use: Never   Sexual activity: Not on file  Other Topics Concern   Not on file  Social History Narrative   Not on file   Social Determinants of Health   Financial Resource Strain: Not on file  Food Insecurity: Not on file  Transportation Needs: Not on file  Physical Activity: Not on file  Stress: Not on file  Social Connections: Not on file      Review of Systems: A 12 point ROS discussed and pertinent positives are indicated in the HPI above.  All other systems are negative.  Review of Systems  Constitutional:  Positive for fatigue. Negative for chills and fever.  HENT:  Positive for trouble swallowing.   Respiratory:  Positive for cough, chest tightness and shortness of breath.        Pt c/o cough w/ hemoptysis (thick, brown-tinged mucous) since her endoscopy  Cardiovascular:  Positive for chest pain.       Pt reports chest pressure/pain   Gastrointestinal:  Positive for nausea and vomiting.   Vital Signs: BP (!) 171/108 (BP Location: Right Arm)   Pulse (!) 108   Temp 98.3 F (36.8 C) (Oral)   Resp 19   Ht 5\' 5"  (1.651 m)   Wt 173 lb 4.5 oz (78.6 kg)   SpO2 97%   BMI 28.84 kg/m   Physical Exam Constitutional:      Appearance: She is ill-appearing.  HENT:     Head: Normocephalic and atraumatic.     Mouth/Throat:     Mouth: Mucous membranes are moist.     Pharynx: Oropharynx is clear.  Cardiovascular:     Rate and Rhythm: Regular rhythm. Tachycardia present.     Pulses: Normal pulses.     Heart sounds: Normal heart sounds. No murmur heard.   No gallop.  Pulmonary:     Effort: Pulmonary effort is normal. No respiratory distress.     Breath sounds: Normal  breath sounds. No stridor. No wheezing or rales.  Abdominal:     General: Bowel sounds are normal.     Tenderness: There is no abdominal tenderness. There is no guarding.  Musculoskeletal:     Right lower leg: No edema.     Left lower leg: No edema.  Skin:    General: Skin is warm and dry.  Neurological:     Mental Status: She is alert and oriented to person, place, and time.  Psychiatric:        Mood and Affect: Mood normal.        Behavior: Behavior normal.        Thought Content: Thought content normal.        Judgment: Judgment normal.    Imaging: CT ABDOMEN WO CONTRAST  Result Date: 08/21/2021 CLINICAL DATA:  60 year old  female referred for possible gastrostomy tube placement EXAM: CT ABDOMEN WITHOUT CONTRAST TECHNIQUE: Multidetector CT imaging of the abdomen was performed following the standard protocol without IV contrast. COMPARISON:  Chest CT 08/16/2021, abdominal CT 07/19/2021 FINDINGS: Lower chest: Small left-sided pleural effusion and trace right-sided pleural effusion. Consolidative airspace disease at the base of the right lung, with volume loss of the visualized right middle lobe, right lower lobe. Pattern lymphatic involvement includes interlobular septal thickening, thickening of the visualized fissure. Ground-glass opacity within the residually aerated lung at the right lung base. Debris within the bronchi of the medial right lower lobe. Calcifications in the right infrahilar region. The appearance is similar to chest CT performed 08/16/2021, however, there has been progression of volume loss of the right middle lobe. Hepatobiliary: Hypodensity within segment 4B adjacent to the falciform ligament, appears increased in size from the prior comparison CTs, now 2.9 cm on image 31 of series 2 high density material within the gallbladder. No inflammatory changes. Pancreas: Unremarkable Spleen: Multiple punctate calcifications within spleen parenchyma, compatible with granulomatous  infection. Adrenals/Urinary Tract: - Right adrenal gland:  Unremarkable - Left adrenal gland: Unremarkable. - Right kidney: No hydronephrosis, nephrolithiasis, inflammation, or ureteral dilation. No focal lesion. - Left Kidney: No hydronephrosis, nephrolithiasis, inflammation, or ureteral dilation. No focal lesion. Stomach/Bowel: Visualized stomach unremarkable. Visualized small bowel decompressed with no focal inflammatory changes. Visualized colon unremarkable. Vascular/Lymphatic: No significant vascular calcifications. Edema within the leaves of the retroperitoneum, as well as edema within the body wall and the mesenteric fat most likely representing positive fluid balance. Trace fluid adjacent to the lesser curvature of the stomach. Other: None Musculoskeletal: No acute displaced fracture.  Degenerative changes. IMPRESSION: No acute finding within the abdomen. There is questionable enlargement of focal hypodensity in segment 4B of the liver adjacent to the falciform ligament, poorly characterized on the noncontrast CT. While this may represent expansion of focal fatty change, a metastatic lesion can not be excluded. Further evaluation with imaging could be performed with either abdominal ultrasound and/or outpatient MRI (outpatient to maximize sensitivity/specificity). Similar appearance airspace and lymphatic disease of the right lower lobe, with increasing volume loss of the right middle lobe. As was previously stated, differential includes infection, recurrence/progressed malignancy, as well as background post treatment changes. Malignancy is of specific concern given the lymphatic disease of the right lower lobe and appearance of carcinomatosis. Small left pleural effusion with mesenteric and body wall edema, most compatible with positive fluid balance Additional ancillary findings as above. Electronically Signed   By: Corrie Mckusick D.O.   On: 08/21/2021 12:10   DG Chest 2 View  Result Date:  08/14/2021 CLINICAL DATA:  Cough, dysphagia, shortness of breath EXAM: CHEST - 2 VIEW COMPARISON:  07/29/2021 FINDINGS: The heart size and mediastinal contours are within normal limits. Unchanged post treatment appearance of the right chest with volume loss of the right hemithorax and a small right pleural effusion. No new airspace opacity. The visualized skeletal structures are unremarkable. IMPRESSION: Unchanged post treatment appearance of the right chest with volume loss of the right hemithorax and a small right pleural effusion. No new airspace opacity. Electronically Signed   By: Eddie Candle M.D.   On: 08/14/2021 12:52   DG Chest 2 View  Result Date: 07/29/2021 CLINICAL DATA:  Chest pain, history of lung cancer EXAM: CHEST - 2 VIEW COMPARISON:  CT chest dated 07/19/2021 FINDINGS: Postsurgical changes with volume loss in the right hemithorax. Additional mild hazy ground-glass opacity, better evaluated on CT,  favoring radiation changes and/or infection. Left lung is clear. Small right pleural effusion. No pneumothorax. The heart is normal in size IMPRESSION: Postsurgical changes with volume loss in the right hemithorax. Superimposed ground-glass opacity in the right lung is better evaluated on recent CT, possibly reflecting radiation changes and/or infection. Electronically Signed   By: Julian Hy M.D.   On: 07/29/2021 22:07   DG Abd 1 View  Result Date: 08/19/2021 CLINICAL DATA:  Constipation, abdominal pain EXAM: ABDOMEN - 1 VIEW COMPARISON:  CT abdomen/pelvis 07/19/2021 FINDINGS: There is a nonobstructive bowel gas pattern. There is a mild stool burden in the rectum and right colon. There is no gross organomegaly or abnormal soft tissue calcification. There is no acute osseous abnormality. IMPRESSION: Nonobstructive bowel gas pattern; mild stool burden as above. Electronically Signed   By: Valetta Mole M.D.   On: 08/19/2021 11:39   CT CHEST W CONTRAST  Result Date: 08/16/2021 CLINICAL  DATA:  Cough, persistent lung mass.  Dysphagia.  A EXAM: CT CHEST WITH CONTRAST TECHNIQUE: Multidetector CT imaging of the chest was performed during intravenous contrast administration. CONTRAST:  44mL OMNIPAQUE IOHEXOL 350 MG/ML SOLN COMPARISON:  CT angiogram chest 07/30/2021. FINDINGS: Cardiovascular: Heart size is within normal limits. The aorta is normal in size. There is no pericardial effusion. Right lower lobe pulmonary arteries diminutive, unchanged. Mediastinum/Nodes: The visualized thyroid gland is within normal limits. 2.3 cm partially calcified right paratracheal lymph node appears unchanged from the prior examination. The esophagus is nondilated. Lungs/Pleura: Again seen is ill-defined right lower lobe parenchymal opacity/mass extending to the right hilum measuring 5.7 x 4.0 cm image 2/86 which appears similar to the prior examination. There is cut off of rhonchi at this level, unchanged. There is prominence of right hilar soft tissue, ill-defined, unchanged from the prior examination. There is new right middle lobe airspace disease and right upper lobe airspace disease extending to the right hilum containing air bronchograms. Additionally, there is increasing ground-glass and airspace disease throughout the right lung apex. There continues to be volume loss on the right. There is a small right pleural effusion which is new from the prior examination. There is likely mild diffuse pleural thickening there is mild diffuse pleural thickening on the right, unchanged. Scattered parenchymal calcifications are seen in the right lung. Focal ground-glass/nodular density in the left upper lobe measuring 7 mm has minimally increased when compared to the prior study. There are scattered calcified granulomas throughout the left lung. Left lung is otherwise clear. Upper Abdomen: There are calcified granulomas in the spleen. Visualized upper abdomen is otherwise within normal limits. Musculoskeletal: Degenerative  changes affect the spine. IMPRESSION: 1. Dense parenchymal opacity in the right lower lobe extending to the right hilum appears unchanged from the prior examination (07/30/2021. Findings may related to post radiation changes, but superimposed infection or neoplasm not excluded. 2. There is new airspace disease in the right middle lobe and right upper lobe worrisome for infection. 3. There is a new small right pleural effusion. 4. Right pleural thickening is stable. 5. Ground-glass/nodular density in the left upper lobe measuring 7 mm has slightly increased in size. Follow-up non-contrast CT recommended at 3-6 months to confirm persistence. If unchanged, and solid component remains <6 mm, annual CT is recommended until 5 years of stability has been established. If persistent these nodules should be considered highly suspicious if the solid component of the nodule is 6 mm or greater in size and enlarging. This recommendation follows the consensus statement: Guidelines for  Management of Incidental Pulmonary Nodules Detected on CT Images: From the Fleischner Society 2017; Radiology 2017; 284:228-243. Electronically Signed   By: Ronney Asters M.D.   On: 08/16/2021 19:03   CT Angio Chest PE W and/or Wo Contrast  Addendum Date: 07/30/2021   ADDENDUM REPORT: 07/30/2021 04:24 ADDENDUM: Upon re-review of CTA chest from 0023 hours today, the following findings are noted: Tiny low-density filling defect in a subsegmental, distal branch to the left lower lobe lateral basal segment seen on series 5, images 215 through 225. This appears primarily nonocclusive, and no other convincing pulmonary artery filling defect is identified. This was discussed by telephone with PA MIA MCDONALD on 07/30/2021 at 04:20. Electronically Signed   By: Genevie Ann M.D.   On: 07/30/2021 04:24   Result Date: 07/30/2021 CLINICAL DATA:  Chest pain EXAM: CT ANGIOGRAPHY CHEST WITH CONTRAST TECHNIQUE: Multidetector CT imaging of the chest was performed  using the standard protocol during bolus administration of intravenous contrast. Multiplanar CT image reconstructions and MIPs were obtained to evaluate the vascular anatomy. CONTRAST:  36mL OMNIPAQUE IOHEXOL 350 MG/ML SOLN COMPARISON:  CT chest dated 07/19/2021 FINDINGS: Cardiovascular: Satisfactory opacification of the bilateral pulmonary arteries to the segmental level. Right lower lobe pulmonary arteries are diminutive. No evidence of pulmonary embolism. Although not tailored for evaluation of the thoracic aorta, there is no evidence thoracic aortic aneurysm or dissection. Heart is top-normal in size.  No pericardial effusion. Mediastinum/Nodes: No suspicious mediastinal lymphadenopathy. 2.3 cm calcified right paratracheal node (series 4/image 87), unchanged. Visualized thyroid is unremarkable. Lungs/Pleura: Bandlike opacity with volume loss in the medial right lower lobe (series 6/image 102), favoring radiation changes. Patchy/ground-glass opacities in the right lung, including in the posterior right upper lobe (series 6/image 87), with associated interlobular septal thickening. This appearance favors radiation changes, although infection is also possible. Stable nodular scarring in the anterior left upper lobe (series 6/image 61). Evaluation of the lung parenchyma is constrained by respiratory motion. Within that constraint, there are no new/suspicious pulmonary nodules. Trace right pleural effusion.  No pneumothorax. Upper Abdomen: Visualized upper abdomen is grossly unremarkable. Musculoskeletal: Mild degenerative changes of the visualized thoracolumbar spine. Review of the MIP images confirms the above findings. IMPRESSION: No evidence of pulmonary embolism. Suspected radiation changes in the right hemithorax. Superimposed infection/pneumonia is possible, but is not favored. No interval change from recent CT. Electronically Signed: By: Julian Hy M.D. On: 07/30/2021 00:48   MR BRAIN W WO  CONTRAST  Result Date: 07/30/2021 CLINICAL DATA:  59 year old female with non-small cell lung cancer. Suspected brain metastases with waxing and waning treatment effect on prior MRIs. Status post SRS to these 2 lesions in June. Restaging. EXAM: MRI HEAD WITHOUT AND WITH CONTRAST TECHNIQUE: Multiplanar, multiecho pulse sequences of the brain and surrounding structures were obtained without and with intravenous contrast. CONTRAST:  32mL GADAVIST GADOBUTROL 1 MMOL/ML IV SOLN COMPARISON:  Brain MRI 05/12/2021 and earlier. FINDINGS: Brain: Petechial enhancement at the right perirolandic cortex on series 16, image 139 is stable since June. Trace hemosiderin and minimal cortical T2 and FLAIR hyperintensity with no regional edema or mass effect. Previously seen subtle left frontal lobe white matter enhancement just superior to the left lateral ventricle does not persist. No new abnormal enhancement identified.  No dural thickening. Widely scattered subcortical and also patchy, confluent periventricular white matter T2 and FLAIR hyperintensity in the brain appears stable. No progression or areas suspicious for vasogenic edema today. No superimposed restricted diffusion to suggest acute infarction. No  midline shift, mass effect, ventriculomegaly, extra-axial collection or acute intracranial hemorrhage. Cervicomedullary junction and pituitary are within normal limits. No other chronic cerebral blood products identified. Vascular: Major intracranial vascular flow voids are stable. Mild generalized intracranial artery tortuosity. The major dural venous sinuses are enhancing and appear to be patent. Skull and upper cervical spine: Stable, negative visible cervical spine. Visualized bone marrow signal is within normal limits. Sinuses/Orbits: Stable, negative. Other: Stable mild mastoid effusions. Negative visible orbits and scalp. IMPRESSION: 1. Satisfactory post SRS appearance of the brain. Stable subtle right superior  perirolandic cortical enhancement with mild associated hemosiderin. Resolved small focus of left corona radiata white matter enhancement. No new metastatic disease identified. 2. Advanced nonspecific white matter disease. No new intracranial abnormality. Electronically Signed   By: Genevie Ann M.D.   On: 07/30/2021 11:15   MR THORACIC SPINE W WO CONTRAST  Result Date: 08/11/2021 CLINICAL DATA:  Back pain with cancer suspected EXAM: MRI THORACIC AND LUMBAR SPINE WITHOUT AND WITH CONTRAST TECHNIQUE: Multiplanar and multiecho pulse sequences of the thoracic and lumbar spine were obtained without and with intravenous contrast. CONTRAST:  52mL GADAVIST GADOBUTROL 1 MMOL/ML IV SOLN COMPARISON:  None. FINDINGS: MRI THORACIC SPINE FINDINGS Alignment:  Slight C7-T1 to T2-3 anterolisthesis Vertebrae: No fracture, evidence of discitis, or bone lesion. Cord:  Normal signal and morphology. Paraspinal and other soft tissues: Post treatment changes in the right lung and mediastinum with volume loss. There has been recent staging CT. Small gallstones. No perispinal mass or inflammation. Disc levels: Small disc protrusions at T2-3 and T7-8, without neural compression. MRI LUMBAR SPINE FINDINGS Segmentation:  5 lumbar type vertebrae Alignment:  Scoliosis and L5-S1 anterolisthesis. Vertebrae: Chronic right pars defect at L5. No acute fracture, discitis, or aggressive bone lesion Conus medullaris: Extends to the L1 level and appears normal. Paraspinal and other soft tissues: Negative for perispinal mass or inflammation. Partially covered fibroids. Disc levels: T12- L1: Small upward pointing disc protrusion L1-L2: Unremarkable. L2-L3: Disc narrowing and rightward bulging.  Negative facets L3-L4: Disc narrowing and bulging eccentric to the left. Asymmetric left facet spurring. L4-L5: Disc narrowing and bulging. Degenerative facet spurring on both sides. L5-S1:Chronic right L5 pars defect with anterolisthesis. The disc is mildly narrowed  with right sided fissuring. Asymmetric right facet spurring with moderate right foraminal narrowing. IMPRESSION: 1. No evidence of metastatic disease to the thoracic or lumbar spine. 2. Lumbar scoliosis and degeneration with L5 chronic right pars defect and L5-S1 anterolisthesis. 3. L5-S1 moderate right foraminal stenosis. Electronically Signed   By: Jorje Guild M.D.   On: 08/11/2021 20:11   MR Lumbar Spine W Wo Contrast  Result Date: 08/11/2021 CLINICAL DATA:  Back pain with cancer suspected EXAM: MRI THORACIC AND LUMBAR SPINE WITHOUT AND WITH CONTRAST TECHNIQUE: Multiplanar and multiecho pulse sequences of the thoracic and lumbar spine were obtained without and with intravenous contrast. CONTRAST:  80mL GADAVIST GADOBUTROL 1 MMOL/ML IV SOLN COMPARISON:  None. FINDINGS: MRI THORACIC SPINE FINDINGS Alignment:  Slight C7-T1 to T2-3 anterolisthesis Vertebrae: No fracture, evidence of discitis, or bone lesion. Cord:  Normal signal and morphology. Paraspinal and other soft tissues: Post treatment changes in the right lung and mediastinum with volume loss. There has been recent staging CT. Small gallstones. No perispinal mass or inflammation. Disc levels: Small disc protrusions at T2-3 and T7-8, without neural compression. MRI LUMBAR SPINE FINDINGS Segmentation:  5 lumbar type vertebrae Alignment:  Scoliosis and L5-S1 anterolisthesis. Vertebrae: Chronic right pars defect at L5. No acute fracture,  discitis, or aggressive bone lesion Conus medullaris: Extends to the L1 level and appears normal. Paraspinal and other soft tissues: Negative for perispinal mass or inflammation. Partially covered fibroids. Disc levels: T12- L1: Small upward pointing disc protrusion L1-L2: Unremarkable. L2-L3: Disc narrowing and rightward bulging.  Negative facets L3-L4: Disc narrowing and bulging eccentric to the left. Asymmetric left facet spurring. L4-L5: Disc narrowing and bulging. Degenerative facet spurring on both sides.  L5-S1:Chronic right L5 pars defect with anterolisthesis. The disc is mildly narrowed with right sided fissuring. Asymmetric right facet spurring with moderate right foraminal narrowing. IMPRESSION: 1. No evidence of metastatic disease to the thoracic or lumbar spine. 2. Lumbar scoliosis and degeneration with L5 chronic right pars defect and L5-S1 anterolisthesis. 3. L5-S1 moderate right foraminal stenosis. Electronically Signed   By: Jorje Guild M.D.   On: 08/11/2021 20:11   DG CHEST PORT 1 VIEW  Result Date: 08/20/2021 CLINICAL DATA:  Lung cancer.  Short of breath. EXAM: PORTABLE CHEST 1 VIEW COMPARISON:  CT 08/16/2021, chest radiograph 08/14/2021 FINDINGS: Now near complete opacification of the RIGHT hemithorax increased significantly comparison chest x-ray 6 days prior. Volume loss in the RIGHT hemithorax. LEFT mediastinal border normal. LEFT lung clear. IMPRESSION: Increasing opacification of the RIGHT hemithorax with near complete opacification increased significantly from radiograph and CT several days prior. Electronically Signed   By: Suzy Bouchard M.D.   On: 08/20/2021 17:45   VAS [place] LOWER EXTREMITY VENOUS (DVT)  Result Date: 07/31/2021  Lower Venous DVT Study Patient Name:  Brittany Mccarty  Date of Exam:   07/31/2021 Medical Rec #: [id number]           Accession #:    [id number] Date of Birth: Sep 24, 1961           Patient Gender: F Patient Age:   37 years Exam Location:  [facility] Procedure:      VAS [place] LOWER EXTREMITY VENOUS (DVT) Referring Phys: NA LI --------------------------------------------------------------------------------  Indications: Pulmonary embolism.  Risk Factors: Cancer. Limitations: Poor ultrasound/tissue interface. Comparison Study: No prior studies. Performing Technologist: Oliver Hum RVT  Examination Guidelines: A complete evaluation includes B-mode imaging, spectral Doppler, color Doppler, and power Doppler as needed of all accessible portions of each  vessel. Bilateral testing is considered an integral part of a complete examination. Limited examinations for reoccurring indications may be performed as noted. The reflux portion of the exam is performed with the patient in reverse Trendelenburg.  +---------+---------------+---------+-----------+----------+--------------+ RIGHT    CompressibilityPhasicitySpontaneityPropertiesThrombus Aging +---------+---------------+---------+-----------+----------+--------------+ CFV      Full           Yes      Yes                                 +---------+---------------+---------+-----------+----------+--------------+ SFJ      Full                                                        +---------+---------------+---------+-----------+----------+--------------+ FV Prox  Full                                                        +---------+---------------+---------+-----------+----------+--------------+  FV Mid   Full                                                        +---------+---------------+---------+-----------+----------+--------------+ FV DistalFull                                                        +---------+---------------+---------+-----------+----------+--------------+ PFV      Full                                                        +---------+---------------+---------+-----------+----------+--------------+ POP      Full           Yes      Yes                                 +---------+---------------+---------+-----------+----------+--------------+ PTV      Full                                                        +---------+---------------+---------+-----------+----------+--------------+ PERO     Full                                                        +---------+---------------+---------+-----------+----------+--------------+   +---------+---------------+---------+-----------+----------+--------------+ LEFT      CompressibilityPhasicitySpontaneityPropertiesThrombus Aging +---------+---------------+---------+-----------+----------+--------------+ CFV      Full           Yes      Yes                                 +---------+---------------+---------+-----------+----------+--------------+ SFJ      Full                                                        +---------+---------------+---------+-----------+----------+--------------+ FV Prox  Full                                                        +---------+---------------+---------+-----------+----------+--------------+ FV Mid   Full                                                        +---------+---------------+---------+-----------+----------+--------------+  FV DistalFull                                                        +---------+---------------+---------+-----------+----------+--------------+ PFV      Full                                                        +---------+---------------+---------+-----------+----------+--------------+ POP      Full           Yes      Yes                                 +---------+---------------+---------+-----------+----------+--------------+ PTV      Full                                                        +---------+---------------+---------+-----------+----------+--------------+ PERO     Full                                                        +---------+---------------+---------+-----------+----------+--------------+     Summary: RIGHT: - There is no evidence of deep vein thrombosis in the lower extremity.  - No cystic structure found in the popliteal fossa.  LEFT: - There is no evidence of deep vein thrombosis in the lower extremity.  - No cystic structure found in the popliteal fossa.  *See table(s) above for measurements and observations. Electronically signed by Servando Snare MD on 07/31/2021 at 5:33:11 PM.    Final    DG ESOPHAGUS W SINGLE CM  (SOL OR THIN BA)  Result Date: 07/31/2021 CLINICAL DATA:  Dysphagia. EXAM: ESOPHOGRAM/BARIUM SWALLOW TECHNIQUE: Single contrast examination was performed using thick and thin density barium and 13 mm barium tablet. FLUOROSCOPY TIME:  Radiation Exposure Index (if provided by the fluoroscopic device): 10.6 mGy. COMPARISON:  None. FINDINGS: No definite mass or stricture is noted. No hiatal hernia or reflux is noted. Barium tablet passed through esophagus and into stomach without difficulty or delay. IMPRESSION: No significant abnormality seen in the esophagus. Electronically Signed   By: Marijo Conception M.D.   On: 07/31/2021 09:01    Labs:  CBC: Recent Labs    08/18/21 0337 08/18/21 0940 08/19/21 0346 08/20/21 0346  WBC 4.6 6.1 5.9 4.8  HGB 12.2 13.2 12.5 12.8  HCT 36.2 39.5 37.7 39.3  PLT 141* 154 158 124*    COAGS: No results for input(s): INR, APTT in the last 8760 hours.  BMP: Recent Labs    08/18/21 0940 08/19/21 0346 08/20/21 0346 08/21/21 0330  NA 141 138 141 141  K 3.5 3.7 3.6 3.6  CL 104 104 107 105  CO2 27 26 26 29   GLUCOSE 100* 126* 111* 161*  BUN <4* <5* <5* 5*  CALCIUM 9.4  8.7* 8.6* 9.0  CREATININE 0.79 0.69 0.57 0.70  GFRNONAA >60 >60 >60 >60    LIVER FUNCTION TESTS: Recent Labs    07/10/21 0746 07/29/21 2320 08/14/21 1407 08/18/21 0940  BILITOT 0.5 0.5 1.0 0.6  AST 30 21 26 16   ALT 22 19 16 8   ALKPHOS 77 70 98 126  PROT 6.9 6.7 7.1 6.3*  ALBUMIN 3.9 3.8 3.9 3.1*    TUMOR MARKERS: No results for input(s): AFPTM, CEA, CA199, CHROMGRNA in the last 8760 hours.  Assessment and Plan: Hx of metastatic lung cancer, PE and dysphagia with protein calorie malnutrition.  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 Pt states she has had dilatation attempts x2 that have failed. Endoscopy 08/15/21 showed possible extrinsic compression. CT 08/16/21 showed likely tumor progression in posterior right lower lobe. She is  unable to swallow solids, fluids or saliva. She states she is constantly spitting or vomiting d/t dysphasia. She also complains of chest pressure, back pain and DOE. Pt currently on PCA for pain control. Dr. Cathlean Sauer referred pt to IR for evaluation and placement gastrostomy tube for nutrition.   Endoscopy findings 08/17/21:   Impression: The examined duodenum was normal. - Normal upper third of esophagus. - Benign-appearing esophageal stenosis. Dilated. - Z-line regular, 35 cm from the incisors. - Normal stomach. - Normal examined duodenum. - No specimens collected.   Pt tachycardic, all other VSS WBC WNL Albumin 3.1 Pt states that she is unable to swallow fluids or saliva  She is A&O, pleasant and calm Pt husband at bedside for most of consult  Dr. Rolla Plate has reviewed and approved pt for g-tube placement.    Risks and benefits image guided gastrostomy tube placement was discussed with the patient including, but not limited to the need for a barium enema during the procedure, bleeding, infection, peritonitis and/or damage to adjacent structures.  All of the patient's questions were answered, patient is agreeable to proceed.  Consent signed and in chart.   Thank you for this interesting consult.  I greatly enjoyed meeting Brittany Mccarty and look forward to participating in their care.  A copy of this report was sent to the requesting provider on this date.  Electronically Signed: Tyson Alias, NP 08/21/2021, 1:11 PM   I spent a total of 30 minutes in face to face in clinical consultation, greater than 50% of which was counseling/coordinating care for gastrostomy tube placement.

## 2021-08-21 NOTE — Progress Notes (Signed)
PROGRESS NOTE  Brittany Mccarty  DOB: 06/04/1961  PCP: Kathyrn Lass IRS:[id number]  DOA: 08/14/2021  LOS: 6 days  Hospital Day: 8   Chief Complaint  Patient presents with   Dysphagia    Brief narrative: Brittany Mccarty is a 60 y.o. female with PMH significant for metastatic lung cancer, pulmonary embolism. Patient presented to the ED on 9/12 with complaint of progressive dysphagia to a point where it is difficult even to swallow the medications..  Also complained of wheezing, productive cough. Recent hospitalization 8/27-8/29 for acute pulm embolism and dysphagia.  On admission her heart rate was elevated to 116. Chest x-ray showed right lower lobe opacity, positive volume loss and mediastinal retraction to the right side. Admitted to hospitalist service for progressive dysphagia and respiratory failure GI and pulmonary consultations were obtained. See below for details.  Subjective: Patient was seen and examined this afternoon.  Pleasant middle-aged Caucasian female.  Sitting up in chair.  Not in distress.  On oxygen by nasal cannula.  Daughter at bedside.  Assessment/Plan: Esophageal stenosis -Patient presented with progressive dysphagia for several days  -Underwent EGD on 9/13, noted to have external compression from progressive lung cancer.  Unable to pass scope.   -She had repeat EGD and esophageal dilatation done on 9/15.  Esophageal biopsy showed fibrotic stroma and no intrinsic malignancy. -Patient was initially hesitant but has now agreed to PEG tube placement. -Continue pain management.  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  -CT chest 09/15 with tumor progression in the posterior right lower lobe  -Pulmonology following.   -Continue 2 L oxygen by nasal cannula. -Noted a plan to start radiation treatment today.  To avoid postradiation nausea vomiting,  patient was started on pretreatment with Decadron and Phenergan.  Hx of pulmonary embolism -Currently Eliquis is on hold because of high risk of hemoptysis with persistent cough.    Elevated blood pressure -Blood pressure elevated to 171/108 this morning.  I do not see any antihypertensive in her home med list.  Blood pressure elevation at this time probably related to pain. -IV hydralazine as needed. -Patient also had mild bilateral pedal edema and was given Lasix 1 dose 20 mg today.  As patient has poor oral intake and is at risk of dehydration, I would avoid IV Lasix at this time.  Severe calorie protein malnutrition -Nutrition consulted.  Plan for PEG tube placement.     Mobility: Encourage ambulation Code Status:   Code Status: Full Code  Nutritional status: Body mass index is 28.84 kg/m. Nutrition Problem: Severe Malnutrition Etiology: acute illness, dysphagia Signs/Symptoms: percent weight loss, energy intake < or equal to 50% for > or equal to 5 days Percent weight loss: 7 % Diet:  Diet Order             Diet NPO time specified  Diet effective midnight           Diet clear liquid Room service appropriate? Yes; Fluid consistency: Thin  Diet effective now                  DVT prophylaxis:  enoxaparin (LOVENOX) injection 40 mg Start: 08/23/21 1400 Place and maintain sequential compression device Start: 08/19/21 0918   Antimicrobials: None Fluid: D5 NS at 50 mill per hour Consultants: Pulmonology, oncology, radiation oncology Family Communication: Daughter at bedside  Status is: Inpatient  Remains inpatient appropriate because: Pending PEG tube placement, palliative radiation  Dispo: The patient is from: Home              Anticipated d/c is to: Home in 2 to 3 days              Patient currently is not medically stable to d/c.   Difficult to place patient No     Infusions:   dextrose 5 % and 0.9% NaCl 50 mL/hr at 08/21/21 1349   lactated ringers 0 mL  (08/15/21 1915)   promethazine (PHENERGAN) injection (IM or IVPB) 25 mg (08/21/21 1228)   [START ON 08/22/2021] vancomycin      Scheduled Meds:  dexamethasone (DECADRON) injection  4 mg Intravenous Q24H   [START ON 08/23/2021] enoxaparin (LOVENOX) injection  40 mg Subcutaneous Q24H   fentaNYL  1 patch Transdermal Q72H   HYDROmorphone   Intravenous Q4H   methylnaltrexone  12 mg Subcutaneous Q48H   pantoprazole (PROTONIX) IV  40 mg Intravenous Q12H   senna-docusate  1 tablet Oral QHS    Antimicrobials: Anti-infectives (From admission, onward)    Start     Dose/Rate Route Frequency Ordered Stop   08/22/21 1500  vancomycin (VANCOCIN) IVPB 1000 mg/200 mL premix        1,000 mg 200 mL/hr over 60 Minutes Intravenous To Radiology 08/21/21 1328 08/23/21 1500       PRN meds: acetaminophen, benzonatate, bisacodyl, cyclobenzaprine, diphenhydrAMINE **OR** diphenhydrAMINE, guaiFENesin, hydrALAZINE, ipratropium-albuterol, LORazepam, naloxone **AND** sodium chloride flush, ondansetron **OR** ondansetron (ZOFRAN) IV, promethazine (PHENERGAN) injection (IM or IVPB), promethazine-codeine, traZODone   Objective: Vitals:   08/21/21 1224 08/21/21 1554  BP: (!) 171/108   Pulse: (!) 108   Resp: 19 20  Temp: 98.3 F (36.8 C)   SpO2:  96%    Intake/Output Summary (Last 24 hours) at 08/21/2021 1635 Last data filed at 08/21/2021 1400 Gross per 24 hour  Intake 2258.45 ml  Output 600 ml  Net 1658.45 ml   Filed Weights   08/15/21 0406 08/15/21 1222  Weight: 78.6 kg 78.6 kg   Weight change:  Body mass index is 28.84 kg/m.   Physical Exam: General exam: Pleasant, middle-aged Caucasian female.  Currently pain controlled Skin: No rashes, lesions or ulcers. HEENT: Atraumatic, normocephalic, no obvious bleeding Lungs: Clear to auscultation bilaterally CVS: Regular rate and rhythm, no murmur GI/Abd soft, nontender, nondistended, bowel sound present CNS: Alert, awake, oriented x3 Psychiatry: Mood  appropriate Extremities: Trace bilateral pedal edema, no calf tenderness  Data Review: I have personally reviewed the laboratory data and studies available.  Recent Labs  Lab 08/17/21 0340 08/18/21 0337 08/18/21 0940 08/19/21 0346 08/20/21 0346  WBC 6.1 4.6 6.1 5.9 4.8  NEUTROABS  --   --  5.2  --   --   HGB 14.5 12.2 13.2 12.5 12.8  HCT 43.8 36.2 39.5 37.7 39.3  MCV 93.6 91.4 91.0 92.6 94.5  PLT 160 141* 154 158 124*   Recent Labs  Lab 08/16/21 0342 08/17/21 0340 08/18/21 0337 08/18/21 0940 08/19/21 0346 08/20/21 0346 08/21/21 0330  NA 142 139 136 141 138 141 141  K 4.5 3.5 3.1* 3.5 3.7 3.6 3.6  CL 107 103 104 104 104 107 105  CO2 28 27 26 27 26 26 29   GLUCOSE 120* 99 103* 100* 126* 111* 161*  BUN 5* <5* <5* <4* <5* <5* 5*  CREATININE 0.79 0.92 0.79 0.79 0.69 0.57 0.70  CALCIUM 9.5 9.0 8.4* 9.4 8.7* 8.6* 9.0  MG 2.2 1.9 1.7  --  1.9 1.8  --  F/u labs ordered Unresulted Labs (From admission, onward)     Start     Ordered   08/22/21 0500  CBC with Differential/Platelet  Tomorrow morning,   R       Question:  Specimen collection method  Answer:  Lab=Lab collect   08/21/21 1324   08/22/21 0500  Protime-INR  Tomorrow morning,   R       Question:  Specimen collection method  Answer:  Lab=Lab collect   08/21/21 1324   08/22/21 9450  Basic metabolic panel  Tomorrow morning,   R       Question:  Specimen collection method  Answer:  Lab=Lab collect   08/21/21 1634   08/22/21 0500  Phosphorus  Tomorrow morning,   R       Question:  Specimen collection method  Answer:  Lab=Lab collect   08/21/21 1634            Signed, Terrilee Croak, MD [facility] 08/21/2021

## 2021-08-21 NOTE — Plan of Care (Signed)
  Problem: Coping: Goal: Level of anxiety will decrease Outcome: Progressing   Problem: Pain Managment: Goal: General experience of comfort will improve Outcome: Progressing   

## 2021-08-21 NOTE — Progress Notes (Signed)
I followed up to see if blood test obtained on Friday was being processed. I checked [facility] patient portal and it is being processed at this time.

## 2021-08-21 NOTE — Plan of Care (Signed)
  Problem: Health Behavior/Discharge Planning: Goal: Ability to manage health-related needs will improve Outcome: Progressing   Problem: Clinical Measurements: Goal: Ability to maintain clinical measurements within normal limits will improve Outcome: Progressing Goal: Will remain free from infection Outcome: Progressing Note: No s/s of infection Goal: Diagnostic test results will improve Outcome: Progressing Goal: Respiratory complications will improve Outcome: Progressing Note: Pt remains SOB w/ exertion. Maintaining O2 sat on 2lnc.  Goal: Cardiovascular complication will be avoided Outcome: Progressing   Problem: Coping: Goal: Level of anxiety will decrease Outcome: Progressing   Problem: Elimination: Goal: Will not experience complications related to bowel motility Outcome: Progressing   Problem: Pain Managment: Goal: General experience of comfort will improve Outcome: Progressing Note: Pt reports effective pain management w/ dilaudid PCA.    Problem: Safety: Goal: Ability to remain free from injury will improve Outcome: Progressing   Problem: Skin Integrity: Goal: Risk for impaired skin integrity will decrease Outcome: Progressing

## 2021-08-21 NOTE — Progress Notes (Signed)
Patient ID: Brittany Mccarty, female   DOB: [birth year], 60 y.o.   MRN: [id number] Aware of request for gastrostomy tube placement in patient.  Case has been reviewed by Dr. Kathlene Cote.  He request follow-up CT abdomen without contrast prior to determining if patient candidate for G-tube.

## 2021-08-21 NOTE — Consult Note (Signed)
NAME:  Brittany Mccarty, MRN:  [id number], DOB:  08-01-1961, LOS: 6 ADMISSION DATE:  08/14/2021, CONSULTATION DATE:  08/21/21 REFERRING MD:  Dr Julien Nordmann, CHIEF COMPLAINT:  Progressive lung cancer   History of Present Illness:  60 year old woman, never smoker who has a diagnosis of stage IV adenocarcinoma (EGFR mutation) diagnosed 04/2020.  Initially was found to have a large right infrahilar mass with associated mediastinal adenopathy, mets to bone, liver, brain.  She underwent brain SRS (completed 05/2021), has been on Tagrisso maintenance.  Course complicated by pulmonary embolism on Eliquis as well as dysphagia, odynophagia, some cough and choking when swallowing. I saw her in the office on 08/11/2021 after her Tagrisso had to be held-she had right upper lobe and right middle lobe groundglass infiltrates that were concerning for either therapy induced pneumonitis or aspiration pneumonitis.  She was treated with corticosteroids as well as empiric antibiotics.  I had planned for a repeat CT scan of the chest to look for resolution of her infiltrates.  If they persisted we were going to discuss possible bronchoscopy. She has since been admitted 9/12 with significant progression of her dysphagia and odynophagia.   Her most recent CT chest was 08/16/2021, reviewed by me, shows the persistent dense right basilar medial opacity and right hilar opacity, certainly progressed compared with scans dating back to June, right pleural thickening with a small effusion, right middle lobe and upper lobe groundglass airspace disease.  No clear impact of malignancy on the esophagus although there is pooled material, significant rightward shift of the mediastinum  Pertinent  Medical History   Past Medical History:  Diagnosis Date   NSCL CA DX'D 04/07/20  PE on Eliquis   Significant Hospital Events: Including procedures, antibiotic start and stop dates in addition to other pertinent events     Interim History /  Subjective:  Continue dysphagia, planning for possible PEG placement Frequent cough Being evaluated for empiric palliative XRT to the right lower lobe mass  Objective   Blood pressure (!) 171/108, pulse (!) 108, temperature 98.3 F (36.8 C), temperature source Oral, resp. rate 20, height 5' 5" (1.651 m), weight 78.6 kg, SpO2 96 %.    FiO2 (%):  [0 %] 0 %   Intake/Output Summary (Last 24 hours) at 08/21/2021 1611 Last data filed at 08/21/2021 1400 Gross per 24 hour  Intake 2258.45 ml  Output 600 ml  Net 1658.45 ml   Filed Weights   08/15/21 0406 08/15/21 1222  Weight: 78.6 kg 78.6 kg    Examination: General: Ill-appearing woman, up to chair, coughing but no distress on 2 L/min HENT: Oropharynx somewhat dry, hoarse voice Lungs: Coarse bilaterally Cardiovascular: Regular, no murmur Abdomen: Nondistended Extremities: No edema Neuro: Awake, interacting appropriately, nonfocal  Resolved Hospital Problem list     Assessment & Plan:  Stage IV adenocarcinoma that appears to have locally progressed based on increased size of her right lower lobe opacity.  There are right upper lobe and right middle lobe groundglass opacities that could be consistent with lymphangitic spread as well.  Some question as to whether there would be benefit to repeat right lower lobe transbronchial biopsies to evaluate for malignancy, send material for more molecular markers and molecular testing to see if there may be additional targeted therapy available.  Discussed with the patient and with Dr. Julien Nordmann.  There would be some risk to performing bronchoscopy given her overall debilitated state but she is stable on supplemental oxygen currently and could probably tolerate.  I did  discuss the risk and benefits with her today.  She would be willing to consider if it was felt that the data would potentially allow additional targeted therapy.  I will work on setting up, possibly Wednesday, discuss further with her  tomorrow to solidify plan.    Labs   CBC: Recent Labs  Lab 08/17/21 0340 08/18/21 0337 08/18/21 0940 08/19/21 0346 08/20/21 0346  WBC 6.1 4.6 6.1 5.9 4.8  NEUTROABS  --   --  5.2  --   --   HGB 14.5 12.2 13.2 12.5 12.8  HCT 43.8 36.2 39.5 37.7 39.3  MCV 93.6 91.4 91.0 92.6 94.5  PLT 160 141* 154 158 124*    Basic Metabolic Panel: Recent Labs  Lab 08/16/21 0342 08/17/21 0340 08/18/21 0337 08/18/21 0940 08/19/21 0346 08/20/21 0346 08/21/21 0330  NA 142 139 136 141 138 141 141  K 4.5 3.5 3.1* 3.5 3.7 3.6 3.6  CL 107 103 104 104 104 107 105  CO2 28 27 26 27 26 26 29  GLUCOSE 120* 99 103* 100* 126* 111* 161*  BUN 5* <5* <5* <4* <5* <5* 5*  CREATININE 0.79 0.92 0.79 0.79 0.69 0.57 0.70  CALCIUM 9.5 9.0 8.4* 9.4 8.7* 8.6* 9.0  MG 2.2 1.9 1.7  --  1.9 1.8  --    GFR: Estimated Creatinine Clearance: 76.5 mL/min (by C-G formula based on SCr of 0.7 mg/dL). Recent Labs  Lab 08/17/21 0846 08/18/21 0337 08/18/21 0940 08/19/21 0346 08/20/21 0346  PROCALCITON <0.10  --   --   --   --   WBC  --  4.6 6.1 5.9 4.8    Liver Function Tests: Recent Labs  Lab 08/18/21 0940  AST 16  ALT 8  ALKPHOS 126  BILITOT 0.6  PROT 6.3*  ALBUMIN 3.1*   No results for input(s): LIPASE, AMYLASE in the last 168 hours. No results for input(s): AMMONIA in the last 168 hours.  ABG No results found for: PHART, PCO2ART, PO2ART, HCO3, TCO2, ACIDBASEDEF, O2SAT   Coagulation Profile: No results for input(s): INR, PROTIME in the last 168 hours.  Cardiac Enzymes: No results for input(s): CKTOTAL, CKMB, CKMBINDEX, TROPONINI in the last 168 hours.  HbA1C: No results found for: HGBA1C  CBG: Recent Labs  Lab 08/17/21 1649 08/18/21 1556 08/20/21 0915  GLUCAP 102* 104* 127*    Review of Systems:   Cough Dysphagia SOB  Past Medical History:  She,  has a past medical history of NSCL CA (DX'D 04/07/20).   Surgical History:   Past Surgical History:  Procedure Laterality Date    BIOPSY  08/15/2021   Procedure: BIOPSY;  Surgeon: Karki, Arya, MD;  Location: [facility];  Service: Gastroenterology;;   ESOPHAGOGASTRODUODENOSCOPY (EGD) WITH PROPOFOL N/A 08/15/2021   Procedure: ESOPHAGOGASTRODUODENOSCOPY (EGD) WITH PROPOFOL;  Surgeon: Karki, Arya, MD;  Location: [facility];  Service: Gastroenterology;  Laterality: N/A;   ESOPHAGOGASTRODUODENOSCOPY (EGD) WITH PROPOFOL N/A 08/17/2021   Procedure: ESOPHAGOGASTRODUODENOSCOPY (EGD) WITH PROPOFOL;  Surgeon: Karki, Arya, MD;  Location: [facility];  Service: Gastroenterology;  Laterality: N/A;   IR REMOVAL OF PLURAL CATH W/CUFF  04/24/2020     Social History:   reports that she has never smoked. She has never used smokeless tobacco. She reports that she does not currently use alcohol. She reports that she does not use drugs.   Family History:  Her family history is not on file.   Allergies Allergies  Allergen Reactions   Other     no blood   transfusions    Cefdinir Rash     Home Medications  Prior to Admission medications   Medication Sig Start Date End Date Taking? Authorizing Provider  apixaban (ELIQUIS) 5 MG TABS tablet Take 2 tablets (10 mg total) by mouth 2 (two) times daily for 7 days, THEN 1 tablet (5 mg total) 2 (two) times daily for 28 days. Patient taking differently: 32m orally daily currently 07/31/21 09/04/21 Yes Sheikh, Omair Latif, DO  benzonatate (TESSALON) 100 MG capsule Take 1 capsule (100 mg total) by mouth 3 (three) times daily as needed for cough. 07/28/21  Yes Heilingoetter, Cassandra L, PA-C  cyclobenzaprine (FLEXERIL) 10 MG tablet Take 1 tablet (10 mg total) by mouth 2 (two) times daily as needed for muscle spasms. 07/30/21  Yes McDonald, Mia A, PA-C  fentaNYL (DURAGESIC) 50 MCG/HR Place 1 patch onto the skin every 3 (three) days. 08/11/21  Yes Heilingoetter, Cassandra L, PA-C  omeprazole (PRILOSEC) 20 MG capsule Take 1 capsule (20 mg total) by mouth daily. 07/24/21  Yes Heilingoetter, Cassandra L, PA-C   ondansetron (ZOFRAN) 4 MG tablet Take 4 mg by mouth every 8 (eight) hours as needed for nausea or vomiting. 04/08/20  Yes [provider]  osimertinib mesylate (TAGRISSO) 80 MG tablet Take 1 tablet (80 mg total) by mouth daily. 06/08/21  Yes MCurt Bears MD  oxyCODONE (OXY IR/ROXICODONE) 5 MG immediate release tablet Take 1 tablet (5 mg total) by mouth 3 (three) times daily between meals as needed for severe pain. 07/31/21  Yes Heilingoetter, Cassandra L, PA-C  promethazine-codeine (PHENERGAN WITH CODEINE) 6.25-10 MG/5ML syrup Take 5-10 mLs by mouth every 6 (six) hours as needed for cough. 08/11/21  Yes BCollene Gobble MD  lidocaine (XYLOCAINE) 2 % solution Use as directed 15 mLs in the mouth or throat as needed for mouth pain. Patient not taking: No sig reported 07/30/21   McDonald, Mia A, PA-C  predniSONE (DELTASONE) 20 MG tablet Take 4 tablets (80 mg) daily for 1 week, followed by 2 tablets (40 mg) daily for 1 week, 1 tablet (20 mg) for 1 week. Patient not taking: Reported on 08/14/2021 07/24/21   Heilingoetter, CTobe Sos PA-C     Critical care time: NA     RBaltazar Apo MD, PhD 08/21/2021, 4:34 PM [facility] [phone] if no answer before 7:00PM call [phone] For any issues after 7:00PM please call eLink [phone]

## 2021-08-21 NOTE — Progress Notes (Signed)
[facility] Gastroenterology Progress Note  Brittany Mccarty 60 y.o. 1961/07/30  CC:  Dysphagia in patient with Stage IV non small cell lung cancer with metastatic disease.   Subjective: S/p EGD with dil to 15 mm 08/16/2021 without changes.  Had first radiation treatment 08/18/2021. She continues to be unable to drink fluids, states even with ice chips after a while will feel regurgitation of white foam, recent scant blood. Denies odynophagia. .  Patient with nausea this morning.  Patient denies fever, chills, abdominal pain.    ROS : Review of Systems  Constitutional:  Positive for malaise/fatigue. Negative for chills, fever and weight loss.  HENT:  Negative for sore throat.   Respiratory:  Positive for cough and shortness of breath.   Cardiovascular:  Negative for chest pain.  Gastrointestinal:  Positive for constipation, nausea and vomiting. Negative for abdominal pain, blood in stool, diarrhea, heartburn and melena.  Musculoskeletal:  Negative for falls.  Neurological:  Negative for focal weakness.  Psychiatric/Behavioral:  Negative for memory loss. The patient is nervous/anxious.    Endoscopy 08/15/2021 that showed benign-appearing esophageal stenosis which was biopsied pending, possible extrinsic compression. CT Chest 08/16/2021 likely tumor progression in the posterior right lower lobe explaining the current dysphagia, shortness of breath as well as the back pain. Endoscopy 08/16/2021 Balloon dilation performed with 12 mm, 13.5 mm, 15 mm balloon today.   Objective: Vital signs in last 24 hours: Vitals:   08/21/21 1218 08/21/21 1224  BP:  (!) 171/108  Pulse:  (!) 108  Resp: 19 19  Temp:  98.3 F (36.8 C)  SpO2: 97%     Physical Exam: Physical Exam Constitutional:      General: She is not in acute distress. Eyes:     Conjunctiva/sclera: Conjunctivae normal.     Pupils: Pupils are equal, round, and reactive to light.  Abdominal:     General: There is no distension.      Palpations: Abdomen is soft.     Tenderness: There is no abdominal tenderness. There is no guarding.  Musculoskeletal:        General: Normal range of motion.  Skin:    Coloration: Skin is not jaundiced.  Neurological:     General: No focal deficit present.     Mental Status: She is alert.     Lab Results: Recent Labs    08/19/21 0346 08/20/21 0346 08/21/21 0330  NA 138 141 141  K 3.7 3.6 3.6  CL 104 107 105  CO2 26 26 29   GLUCOSE 126* 111* 161*  BUN <5* <5* 5*  CREATININE 0.69 0.57 0.70  CALCIUM 8.7* 8.6* 9.0  MG 1.9 1.8  --     No results for input(s): AST, ALT, ALKPHOS, BILITOT, PROT, ALBUMIN in the last 72 hours.  Recent Labs    08/19/21 0346 08/20/21 0346  WBC 5.9 4.8  HGB 12.5 12.8  HCT 37.7 39.3  MCV 92.6 94.5  PLT 158 124*    No results for input(s): LABPROT, INR in the last 72 hours.  Lab Results: Results for orders placed or performed during the hospital encounter of 08/14/21 (from the past 48 hour(s))  Basic metabolic panel     Status: Abnormal   Collection Time: 08/20/21  3:46 AM  Result Value Ref Range   Sodium 141 135 - 145 mmol/L   Potassium 3.6 3.5 - 5.1 mmol/L   Chloride 107 98 - 111 mmol/L   CO2 26 22 - 32 mmol/L   Glucose, Bld 111 (  H) 70 - 99 mg/dL    Comment: Glucose reference range applies only to samples taken after fasting for at least 8 hours.   BUN <5 (L) 8 - 23 mg/dL   Creatinine, Ser 0.57 0.44 - 1.00 mg/dL   Calcium 8.6 (L) 8.9 - 10.3 mg/dL   GFR, Estimated >60 >60 mL/min    Comment: (NOTE) Calculated using the CKD-EPI Creatinine Equation (2021)    Anion gap 8 5 - 15    Comment: Performed at [facility], [place] [address] [place], [place] [address]  CBC     Status: Abnormal   Collection Time: 08/20/21  3:46 AM  Result Value Ref Range   WBC 4.8 4.0 - 10.5 K/uL   RBC 4.16 3.87 - 5.11 MIL/uL   Hemoglobin 12.8 12.0 - 15.0 g/dL   HCT 39.3 36.0 - 46.0 %   MCV 94.5 80.0 - 100.0 fL   MCH 30.8 26.0 - 34.0 pg    MCHC 32.6 30.0 - 36.0 g/dL   RDW 12.9 11.5 - 15.5 %   Platelets 124 (L) 150 - 400 K/uL   nRBC 0.0 0.0 - 0.2 %    Comment: Performed at [facility], [place] [address], [place], [place] [address]  Magnesium     Status: None   Collection Time: 08/20/21  3:46 AM  Result Value Ref Range   Magnesium 1.8 1.7 - 2.4 mg/dL    Comment: Performed at [facility], [place] [address], [place], [place] [address]  Glucose, capillary     Status: Abnormal   Collection Time: 08/20/21  9:15 AM  Result Value Ref Range   Glucose-Capillary 127 (H) 70 - 99 mg/dL    Comment: Glucose reference range applies only to samples taken after fasting for at least 8 hours.  Basic metabolic panel     Status: Abnormal   Collection Time: 08/21/21  3:30 AM  Result Value Ref Range   Sodium 141 135 - 145 mmol/L   Potassium 3.6 3.5 - 5.1 mmol/L   Chloride 105 98 - 111 mmol/L   CO2 29 22 - 32 mmol/L   Glucose, Bld 161 (H) 70 - 99 mg/dL    Comment: Glucose reference range applies only to samples taken after fasting for at least 8 hours.   BUN 5 (L) 8 - 23 mg/dL   Creatinine, Ser 0.70 0.44 - 1.00 mg/dL   Calcium 9.0 8.9 - 10.3 mg/dL   GFR, Estimated >60 >60 mL/min    Comment: (NOTE) Calculated using the CKD-EPI Creatinine Equation (2021)    Anion gap 7 5 - 15    Comment: Performed at [facility], [place] [address] [place],  [address]    Assessment/Plan: Dysphagia in patient with metastatic adenocarcinoma non-small cell lung cancer.   Normal barium swallow 07/31/2021. EGD 08/15/2021 benign-appearing esophageal stenosis showed possible extrinsic compression, biopsies from 08/15/2021 showed squamous mucosa with small amount of fibrotic submucosal stroma without evidence of dysplasia or malignancy, raising the possibility of radiation-induced esophageal stricture.  Repeat endoscopy with dilatation to 15 mm did not have any improvement in symptoms. Getting radiation  therapy for likely extrinsic compression. Could consider repeat endoscopy with dilatation if symptoms not improving with radiation.  Patient getting PEG placement GI with follow at a distance, please call with any questions or if we can help during this hospital stay.   Vladimir Crofts PA-C 08/21/2021, 12:30 PM  Contact #  [phone]

## 2021-08-21 NOTE — Progress Notes (Signed)
Brittany Mccarty had a 6 second run of SVT to 190's.  She states " something I was talking about stressed me out for a moment." She does states that she briefly felt her heart race. Currently denies any symptoms and HR is ST low 100's. Dr Pietro Cassis aware.

## 2021-08-22 ENCOUNTER — Ambulatory Visit
Admission: RE | Admit: 2021-08-22 | Discharge: 2021-08-22 | Disposition: A | Discharge status: Home / Self Care | Payer: 59 | Source: Ambulatory Visit | Attending: Radiation Oncology | Admitting: Radiation Oncology

## 2021-08-22 DIAGNOSIS — R1312 Dysphagia, oropharyngeal phase: Secondary | ICD-10-CM | POA: Diagnosis not present

## 2021-08-22 DIAGNOSIS — C3491 Malignant neoplasm of unspecified part of right bronchus or lung: Secondary | ICD-10-CM | POA: Diagnosis not present

## 2021-08-22 LAB — CBC WITH DIFFERENTIAL/PLATELET
Abs Immature Granulocytes: 0.02 10*3/uL (ref 0.00–0.07)
Basophils Absolute: 0 10*3/uL (ref 0.0–0.1)
Basophils Relative: 0 %
Eosinophils Absolute: 0 10*3/uL (ref 0.0–0.5)
Eosinophils Relative: 0 %
HCT: 35.7 % — ABNORMAL LOW (ref 36.0–46.0)
Hemoglobin: 11.8 g/dL — ABNORMAL LOW (ref 12.0–15.0)
Immature Granulocytes: 0 %
Lymphocytes Relative: 4 %
Lymphs Abs: 0.2 10*3/uL — ABNORMAL LOW (ref 0.7–4.0)
MCH: 30.6 pg (ref 26.0–34.0)
MCHC: 33.1 g/dL (ref 30.0–36.0)
MCV: 92.5 fL (ref 80.0–100.0)
Monocytes Absolute: 0.3 10*3/uL (ref 0.1–1.0)
Monocytes Relative: 5 %
Neutro Abs: 4.7 10*3/uL (ref 1.7–7.7)
Neutrophils Relative %: 91 %
Platelets: 147 10*3/uL — ABNORMAL LOW (ref 150–400)
RBC: 3.86 MIL/uL — ABNORMAL LOW (ref 3.87–5.11)
RDW: 12.8 % (ref 11.5–15.5)
WBC: 5.2 10*3/uL (ref 4.0–10.5)
nRBC: 0 % (ref 0.0–0.2)

## 2021-08-22 LAB — BASIC METABOLIC PANEL
Anion gap: 8 (ref 5–15)
BUN: 7 mg/dL — ABNORMAL LOW (ref 8–23)
CO2: 30 mmol/L (ref 22–32)
Calcium: 8.8 mg/dL — ABNORMAL LOW (ref 8.9–10.3)
Chloride: 102 mmol/L (ref 98–111)
Creatinine, Ser: 0.72 mg/dL (ref 0.44–1.00)
GFR, Estimated: >60 mL/min (ref >60)
Glucose, Bld: 127 mg/dL — ABNORMAL HIGH (ref 70–99)
Potassium: 3.2 mmol/L — ABNORMAL LOW (ref 3.5–5.1)
Sodium: 140 mmol/L (ref 135–145)

## 2021-08-22 LAB — PHOSPHORUS: Phosphorus: 3.4 mg/dL (ref 2.5–4.6)

## 2021-08-22 LAB — PROTIME-INR
INR: 1.2 (ref 0.8–1.2)
Prothrombin Time: 15.5 seconds — ABNORMAL HIGH (ref 11.4–15.2)

## 2021-08-22 MED ORDER — POTASSIUM CHLORIDE CRYS ER 20 MEQ PO TBCR: 40.0000 meq | EXTENDED_RELEASE_TABLET | Freq: Once | ORAL | Status: DC

## 2021-08-22 MED ORDER — THIAMINE HCL 100 MG/ML IJ SOLN
100.0000 mg | Freq: Every day | INTRAMUSCULAR | Status: DC
  Administered 2021-08-22 – 2021-08-29 (×8): 100 mg via INTRAVENOUS
  Filled 2021-08-22 (×8): qty 2

## 2021-08-22 MED ORDER — POTASSIUM CHLORIDE 10 MEQ/100ML IV SOLN
10.0000 meq | INTRAVENOUS | Status: AC
Start: 2021-08-22 — End: 2021-08-22
  Administered 2021-08-22 (×3): 10 meq via INTRAVENOUS
  Filled 2021-08-22 (×3): qty 100

## 2021-08-22 NOTE — Progress Notes (Signed)
PROGRESS NOTE  Brittany Mccarty  DOB: 08-03-1961  PCP: Kathyrn Lass ACZ:[id number]  DOA: 08/14/2021  LOS: 7 days  Hospital Day: 9   Chief Complaint  Patient presents with   Dysphagia    Brief narrative: Brittany Mccarty is a 60 y.o. female with PMH significant for metastatic lung cancer, pulmonary embolism. Patient presented to the ED on 9/12 with complaint of progressive dysphagia to a point where it is difficult even to swallow the medications..  Also complained of wheezing, productive cough. Recent hospitalization 8/27-8/29 for acute pulm embolism and dysphagia.  On admission her heart rate was elevated to 116. Chest x-ray showed right lower lobe opacity, positive volume loss and mediastinal retraction to the right side. Admitted to hospitalist service for progressive dysphagia and respiratory failure GI and pulmonary consultations were obtained. See below for details.  Subjective: Patient was seen and examined this afternoon. Sitting up in chair.  On 2 L oxygen by nasal cannula.  She states that she was able to tolerate some liquid diet last night.  Pending PEG tube placement today.  Assessment/Plan: Esophageal stenosis -Patient presented with progressive dysphagia for several days  -Underwent EGD on 9/13, noted to have external compression from progressive lung cancer.  Unable to pass scope.   -She had repeat EGD and esophageal dilatation done on 9/15.  Esophageal biopsy showed fibrotic stroma and no intrinsic malignancy. -Patient was initially hesitant but has now agreed to PEG tube placement.  Pending PEG tube placement today. -Continue pain management.  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  -CT chest 09/15 with tumor progression in the posterior right lower lobe  -Pulmonology following.  Noted a plan to do a bronchoscopy likely as an inpatient. -Continue 2  L oxygen by nasal cannula.  Encourage ambulation. -Patient also is getting radiation treatment.  Hx of pulmonary embolism -Currently Eliquis is on hold because of high risk of hemoptysis with persistent cough.    Elevated blood pressure -Blood pressure elevated to 160s and 150s.  I do not see any antihypertensive in her home med list.  Blood pressure elevation at this time probably related to pain. -IV hydralazine as needed. -Patient also had mild bilateral pedal edema and was given Lasix 1 dose 20 mg on 9/19.  IV fluids stopped.  Severe calorie protein malnutrition -Nutrition consulted.  Pending PEG tube placement.     Mobility: Encourage ambulation Code Status:   Code Status: Full Code  Nutritional status: Body mass index is 28.84 kg/m. Nutrition Problem: Severe Malnutrition Etiology: acute illness, dysphagia Signs/Symptoms: percent weight loss, energy intake < or equal to 50% for > or equal to 5 days Percent weight loss: 7 % Diet:  Diet Order             Diet NPO time specified  Diet effective midnight                  DVT prophylaxis:  enoxaparin (LOVENOX) injection 40 mg Start: 08/23/21 1400 Place and maintain sequential compression device Start: 08/19/21 1093   Antimicrobials: None Fluid: IV fluids stopped today Consultants: Pulmonology, oncology, radiation oncology Family Communication: None at bedside  Status is: Inpatient  Remains inpatient appropriate because: Pending PEG tube placement, palliative radiation  Dispo: The patient is from: Home              Anticipated d/c is to: Home next week after radiation is complete.  Patient currently is not medically stable to d/c.   Difficult to place patient No     Infusions:   lactated ringers 0 mL (08/15/21 1915)   promethazine (PHENERGAN) injection (IM or IVPB) 25 mg (08/22/21 0416)   vancomycin      Scheduled Meds:  dexamethasone (DECADRON) injection  4 mg Intravenous Q24H   [START ON  08/23/2021] enoxaparin (LOVENOX) injection  40 mg Subcutaneous Q24H   fentaNYL  1 patch Transdermal Q72H   HYDROmorphone   Intravenous Q4H   methylnaltrexone  12 mg Subcutaneous Q48H   pantoprazole (PROTONIX) IV  40 mg Intravenous Q12H   senna-docusate  1 tablet Oral QHS    Antimicrobials: Anti-infectives (From admission, onward)    Start     Dose/Rate Route Frequency Ordered Stop   08/22/21 1500  vancomycin (VANCOCIN) IVPB 1000 mg/200 mL premix        1,000 mg 200 mL/hr over 60 Minutes Intravenous To Radiology 08/21/21 1328 08/23/21 1500       PRN meds: acetaminophen, benzonatate, bisacodyl, cyclobenzaprine, diphenhydrAMINE **OR** diphenhydrAMINE, guaiFENesin, hydrALAZINE, ipratropium-albuterol, LORazepam, naloxone **AND** sodium chloride flush, ondansetron **OR** ondansetron (ZOFRAN) IV, promethazine (PHENERGAN) injection (IM or IVPB), promethazine-codeine, traZODone   Objective: Vitals:   08/22/21 1247 08/22/21 1334  BP:  (!) 151/103  Pulse:  (!) 101  Resp: 14 15  Temp:  97.6 F (36.4 C)  SpO2: 95% 100%    Intake/Output Summary (Last 24 hours) at 08/22/2021 1343 Last data filed at 08/22/2021 1020 Gross per 24 hour  Intake 1757.77 ml  Output 1150 ml  Net 607.77 ml    Filed Weights   08/15/21 0406 08/15/21 1222  Weight: 78.6 kg 78.6 kg   Weight change:  Body mass index is 28.84 kg/m.   Physical Exam: General exam: Pleasant, middle-aged Caucasian female.  Sitting up in chair.  Currently pain controlled.  On low-flow oxygen. Skin: No rashes, lesions or ulcers. HEENT: Atraumatic, normocephalic, no obvious bleeding Lungs: Clear to auscultation bilaterally CVS: Regular rate and rhythm, no murmur GI/Abd soft, nontender, nondistended, bowel sound present CNS: Alert, awake, oriented x3 Psychiatry: Mood appropriate Extremities: Trace bilateral pedal edema, no calf tenderness  Data Review: I have personally reviewed the laboratory data and studies available.  Recent  Labs  Lab 08/18/21 0337 08/18/21 0940 08/19/21 0346 08/20/21 0346 08/22/21 0358  WBC 4.6 6.1 5.9 4.8 5.2  NEUTROABS  --  5.2  --   --  4.7  HGB 12.2 13.2 12.5 12.8 11.8*  HCT 36.2 39.5 37.7 39.3 35.7*  MCV 91.4 91.0 92.6 94.5 92.5  PLT 141* 154 158 124* 147*    Recent Labs  Lab 08/16/21 0342 08/17/21 0340 08/18/21 0337 08/18/21 0940 08/19/21 0346 08/20/21 0346 08/21/21 0330 08/22/21 0358  NA 142 139 136 141 138 141 141 140  K 4.5 3.5 3.1* 3.5 3.7 3.6 3.6 3.2*  CL 107 103 104 104 104 107 105 102  CO2 28 27 26 27 26 26 29 30   GLUCOSE 120* 99 103* 100* 126* 111* 161* 127*  BUN 5* <5* <5* <4* <5* <5* 5* 7*  CREATININE 0.79 0.92 0.79 0.79 0.69 0.57 0.70 0.72  CALCIUM 9.5 9.0 8.4* 9.4 8.7* 8.6* 9.0 8.8*  MG 2.2 1.9 1.7  --  1.9 1.8  --   --   PHOS  --   --   --   --   --   --   --  3.4     F/u labs ordered Unresulted Labs (  From admission, onward)    None       Signed, Terrilee Croak, MD [facility] 08/22/2021

## 2021-08-22 NOTE — Progress Notes (Signed)
Pt O2 sat 94% on RA at rest. Pt ambulated from bed to room doorway on RA, sat dropped to 86%. Placed back on 2lnc with increase in sat to 95%.

## 2021-08-22 NOTE — Progress Notes (Addendum)
Palliative Care Progress Note  Events from weekend reviewed. Worsening nausea and vomiting s/p radiation. Pain is under much better control. Issues with SVT and hypertension.Worsening edema.  Goals of Care  Brittany Mccarty and her husband are increasingly frustrated with her cancer progression and continue to seek answers and information on her current status, prognosis and plan of care. 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 The plan is for her to get a PEG tube for supplemental nutrition while getting radiation. I provided information of prior imaging at their request and tried to answer questions about the timeline of her cancer progression. I went over her CT scans done in June, August and the most recent one done this admission on 9/14. They feel like they are uncertain about what the etiology of the changes are ie. infection, inflammatory or tumor.  She has been highly functional up until this point on targeted therapy and they are concerned about delays in treatment.   Given her current status and disease progression in setting of hospitalization, I discussed "Code Status" and DNR with both patient and her husband. I made a strong recommendation for no intubation or CPR in the even of cardiac arrest or respiratory failure based on her diagnosis of terminal lung cancer. I also advocated for ongoing full scope treatment to get her both quality and length of life if possible-we are all hopeful for this.She is going to think about Code status and discuss with her family in more detail. I will provide Hard Choices book.  2. Nausea Vomiting CT today prior to PEG placement. Pre-medicate before radiation with Decadron 4mg  IV and Phenergan. Improved after starting steroids yesterday.  3. Pain- Maintain PCA- will begin to transition her to a fentanyl patch and PO breakthrough once PEG is in place. Can also use a liquid.  4. Constipation Continue Relistor QOD  5. HTN, SVT,  Tachycardia She appears to be volume over loaded-probably low albumin state contributing but with HTN will diurese X1 with Lasix. She will benefit from Labetalol or clonidine for HTN and the tachycardias- probably all involved with mediastium and vagal stimulation from extrinsic tumor compression.   Lane Hacker, DO Palliative Medicine   Time:35 min Greater than 50%  of this time was spent counseling and coordinating care related to the above assessment and plan.

## 2021-08-22 NOTE — Progress Notes (Signed)
NAME:  Brittany Mccarty, MRN:  [id number], DOB:  January 21, 1961, LOS: 7 ADMISSION DATE:  08/14/2021, CONSULTATION DATE:  08/21/21 REFERRING MD:  Dr Julien Nordmann, CHIEF COMPLAINT:  Progressive lung cancer   History of Present Illness:  60 year old woman, never smoker who has a diagnosis of stage IV adenocarcinoma (EGFR mutation) diagnosed 04/2020.  Initially was found to have a large right infrahilar mass with associated mediastinal adenopathy, mets to bone, liver, brain.  She underwent brain SRS (completed 05/2021), has been on Tagrisso maintenance.  Course complicated by pulmonary embolism on Eliquis as well as dysphagia, odynophagia, some cough and choking when swallowing. I saw her in the office on 08/11/2021 after her Tagrisso had to be held- she had right upper lobe and right middle lobe groundglass infiltrates that were concerning for either therapy-induced pneumonitis or aspiration pneumonitis.  She was treated with corticosteroids as well as empiric antibiotics.  I had planned for a repeat CT scan of the chest to look for resolution of her infiltrates.  If they persisted we were going to discuss possible bronchoscopy. She has since been admitted 9/12 with significant progression of her dysphagia and odynophagia.   Her most recent CT chest was 08/16/2021, reviewed by me, shows the persistent dense right basilar medial opacity and right hilar opacity, certainly progressed compared with scans dating back to June, right pleural thickening with a small effusion, right middle lobe and upper lobe groundglass airspace disease.  No clear impact of malignancy on the esophagus although there is pooled material, significant rightward shift of the mediastinum  Pertinent  Medical History   Past Medical History:  Diagnosis Date   NSCL CA DX'D 04/07/20  PE on Eliquis   Significant Hospital Events: Including procedures, antibiotic start and stop dates in addition to other pertinent events     Interim History /  Subjective:   She underwent XRT 9/19 Was able to take some water p.o. that was swallowed effectively, then had nausea last night, responded to [facility] for PEG placement today 9/20  Objective   Blood pressure (!) 163/103, pulse (!) 110, temperature 98.6 F (37 C), resp. rate 15, height 5' 5"  (1.651 m), weight 78.6 kg, SpO2 100 %.        Intake/Output Summary (Last 24 hours) at 08/22/2021 0827 Last data filed at 08/22/2021 2841 Gross per 24 hour  Intake 803.79 ml  Output 1150 ml  Net -346.21 ml   Filed Weights   08/15/21 0406 08/15/21 1222  Weight: 78.6 kg 78.6 kg    Examination: General: Chronically ill-appearing woman, laying in bed in no distress HENT: Oropharynx moist, voice more clear today Lungs: Coarse bilaterally, no wheezes Cardiovascular: Regular, distant, no murmur Abdomen: Nondistended Extremities: No edema Neuro: Awake, oriented, nonfocal  Resolved Hospital Problem list     Assessment & Plan:  Stage IV adenocarcinoma that appears to have locally progressed based on increased size of her right lower lobe opacity.  There are right upper lobe and right middle lobe groundglass opacities that could be consistent with lymphangitic spread as well (vs pneumonitis vs infectious).  Discussed with Dr. Julien Nordmann and there probably is some value in repeating her bronchoscopy, performing right lower lobe transbronchial biopsies to look for malignancy, facilitate molecular testing.  Patient understands the rationale for this, in general is agreeable to proceed if all believe that the procedure would potentially provide an avenue to useful targeted therapy.  Initially considered setting up bronchoscopy for Wednesday 9/21 but patient is concerned that she will be  up to anesthesia, procedure, since she is having PEG placed today.  She wants to hold off, revisit the timing.  I will continue to see her, discuss with her.  Would be most straightforward to get it done this week  while she is admitted.  Alternatively we could defer to the outpatient setting   Labs   CBC: Recent Labs  Lab 08/18/21 0337 08/18/21 0940 08/19/21 0346 08/20/21 0346 08/22/21 0358  WBC 4.6 6.1 5.9 4.8 5.2  NEUTROABS  --  5.2  --   --  4.7  HGB 12.2 13.2 12.5 12.8 11.8*  HCT 36.2 39.5 37.7 39.3 35.7*  MCV 91.4 91.0 92.6 94.5 92.5  PLT 141* 154 158 124* 147*    Basic Metabolic Panel: Recent Labs  Lab 08/16/21 0342 08/17/21 0340 08/18/21 0337 08/18/21 0940 08/19/21 0346 08/20/21 0346 08/21/21 0330 08/22/21 0358  NA 142 139 136 141 138 141 141 140  K 4.5 3.5 3.1* 3.5 3.7 3.6 3.6 3.2*  CL 107 103 104 104 104 107 105 102  CO2 28 27 26 27 26 26 29 30   GLUCOSE 120* 99 103* 100* 126* 111* 161* 127*  BUN 5* <5* <5* <4* <5* <5* 5* 7*  CREATININE 0.79 0.92 0.79 0.79 0.69 0.57 0.70 0.72  CALCIUM 9.5 9.0 8.4* 9.4 8.7* 8.6* 9.0 8.8*  MG 2.2 1.9 1.7  --  1.9 1.8  --   --   PHOS  --   --   --   --   --   --   --  3.4   GFR: Estimated Creatinine Clearance: 76.5 mL/min (by C-G formula based on SCr of 0.72 mg/dL). Recent Labs  Lab 08/17/21 0846 08/18/21 0337 08/18/21 0940 08/19/21 0346 08/20/21 0346 08/22/21 0358  PROCALCITON <0.10  --   --   --   --   --   WBC  --    < > 6.1 5.9 4.8 5.2   < > = values in this interval not displayed.    Liver Function Tests: Recent Labs  Lab 08/18/21 0940  AST 16  ALT 8  ALKPHOS 126  BILITOT 0.6  PROT 6.3*  ALBUMIN 3.1*   No results for input(s): LIPASE, AMYLASE in the last 168 hours. No results for input(s): AMMONIA in the last 168 hours.  ABG No results found for: PHART, PCO2ART, PO2ART, HCO3, TCO2, ACIDBASEDEF, O2SAT   Coagulation Profile: Recent Labs  Lab 08/22/21 0358  INR 1.2     Critical care time: NA     Baltazar Apo, MD, PhD 08/22/2021, 8:27 AM [facility] [phone] or if no answer before 7:00PM call [phone] For any issues after 7:00PM please call eLink [phone]

## 2021-08-22 NOTE — Progress Notes (Signed)
Dilaudid PCA syringe changed w/ Otila Back, RN. 2.21ml wasted from near empty syringe, witnessed by Otila Back, RN.

## 2021-08-22 NOTE — Progress Notes (Signed)
Nutrition Follow-up  DOCUMENTATION CODES:   Severe malnutrition in context of acute illness/injury  INTERVENTION:  - diet advancement as medically feasible. - weigh patient today.   - 24 hours after PEG placement, recommend Osmolite 1.5 @ 20 ml/hr to advance by 10 ml every 24 hours to reach goal rate of 60 ml/hr with 45 ml Prosource TF TID and 100 ml free water every 4 hours. - at goal rate, this regimen will provide 2280 kcal, 123 grams protein, and 1697 ml free water.   - recommend 100 mg IV thiamine/day.  - will order 1 tablet multivitamin with minerals/day via PEG once placed.   - can consider transition to bolus TF or nocturnal/intermittent once tolerance of goal rate is established.   Monitor magnesium, potassium, and phosphorus daily for at least 3 days, MD to replete as needed, as pt is at risk for refeeding syndrome given severe malnutrition, inadequate nutrition intake for ~2 months.    NUTRITION DIAGNOSIS:   Severe Malnutrition related to acute illness, dysphagia as evidenced by percent weight loss, energy intake < or equal to 50% for > or equal to 5 days. -ongoing  GOAL:   Patient will meet greater than or equal to 90% of their needs -unmet/unable to meet   MONITOR:   Diet advancement, TF tolerance, Labs, Weight trends, I & O's  ASSESSMENT:   60 y.o. female with medical history of stage 4 metastatic lung cancer on chemo, PE on Eliquis, dysphagia, and recent hospitalization from 8/27-8/29 for acute PE and dysphagia. She presented to the ED due to progressive dysphagia. GI consulted.  Patient has been NPO or on CLD since admission on 9/12. She had an EGD on 9/13 which was unable to be completed d/t inability to adequately pass scope. Repeat EGD and esophageal dilatation done on 9/15.   She is pending PEG placement either later today vs 9/21.   Patient sitting up in the chair at the time of the visit. Talked with her about PEG placement, timeline for starting and  advancing TF once tube in place. She denies any questions or concerns at this time. She was able to drink 1/2 cup of water last night and feels that radiation (which started on 9/16) has been helpful in allowing her to move toward her goal of being able to eat and drink again.   She has not been weighed since 9/13. Non-pitting edema to BLE documented in the edema section of flow sheet. She is noted to be +12.18 L since admission.   Palliative Care is following and last saw patient on 9/19. Patient remains Full Code at this time.     Labs reviewed; CBG: 127 mg/dl, K: 3.2 mmol/l, BUN: 7 mg/dl, Ca: 8.8 mg/dl.  Medications reviewed; 40 mg IV protonix BID, 10 mEq IV KCl x3 runs 9/20, 1 tablet senokot/day.   Diet Order:   Diet Order             Diet NPO time specified  Diet effective midnight                   EDUCATION NEEDS:   Not appropriate for education at this time  Skin:  Skin Assessment: Reviewed RN Assessment  Last BM:  PTA/unknown  Height:   Ht Readings from Last 1 Encounters:  08/15/21 5\' 5"  (1.651 m)    Weight:   Wt Readings from Last 1 Encounters:  08/15/21 78.6 kg     Estimated Nutritional Needs:  Kcal:  0102-7253  kcal Protein:  110-130 grams Fluid:  >/= 2.3 L/day      Jarome Matin, MS, RD, LDN, CNSC Inpatient Clinical Dietitian RD pager # available in [facility]  After hours/weekend pager # available in [facility]

## 2021-08-22 NOTE — Progress Notes (Signed)
Subjective: The patient is seen and examined today.  She continues to have back pain as well as shortness of breath with exertion and dysphagia.  She is tolerating her palliative radiotherapy fairly well with mild improvement in the dysphagia.  She was seen by Dr. Lamonte Sakai and has a discussion about repeat bronchoscopy but she would like to hold on this procedure until after the molecular study from [facility] becomes available.  For pain management she continues on her treatment with fentanyl patch and Dilaudid.  She is now more acceptable for the PEG tube placement.  Objective: Vital signs in last 24 hours: Temp:  [97.9 F (36.6 C)-99 F (37.2 C)] 98.4 F (36.9 C) (09/20 0910) Pulse Rate:  [67-118] 118 (09/20 0910) Resp:  [14-20] 14 (09/20 1247) BP: (138-167)/(91-111) 167/111 (09/20 0910) SpO2:  [93 %-100 %] 95 % (09/20 1247)  Intake/Output from previous day: 09/19 0701 - 09/20 0700 In: 803.8 [I.V.:753.8; IV Piggyback:50] Out: 900 [Urine:900] Intake/Output this shift: Total I/O In: 1004 [I.V.:904; IV Piggyback:100] Out: 250 [Urine:250]  General appearance: alert, cooperative, fatigued, and no distress Resp: diminished breath sounds RLL and dullness to percussion RLL Cardio: regular rate and rhythm, S1, S2 normal, no murmur, click, rub or gallop GI: soft, non-tender; bowel sounds normal; no masses,  no organomegaly Extremities: extremities normal, atraumatic, no cyanosis or edema  Lab Results:  Recent Labs    08/20/21 0346 08/22/21 0358  WBC 4.8 5.2  HGB 12.8 11.8*  HCT 39.3 35.7*  PLT 124* 147*   BMET Recent Labs    08/21/21 0330 08/22/21 0358  NA 141 140  K 3.6 3.2*  CL 105 102  CO2 29 30  GLUCOSE 161* 127*  BUN 5* 7*  CREATININE 0.70 0.72  CALCIUM 9.0 8.8*    Studies/Results: CT ABDOMEN WO CONTRAST  Result Date: 08/21/2021 CLINICAL DATA:  60 year old female referred for possible gastrostomy tube placement EXAM: CT ABDOMEN WITHOUT CONTRAST TECHNIQUE:  Multidetector CT imaging of the abdomen was performed following the standard protocol without IV contrast. COMPARISON:  Chest CT 08/16/2021, abdominal CT 07/19/2021 FINDINGS: Lower chest: Small left-sided pleural effusion and trace right-sided pleural effusion. Consolidative airspace disease at the base of the right lung, with volume loss of the visualized right middle lobe, right lower lobe. Pattern lymphatic involvement includes interlobular septal thickening, thickening of the visualized fissure. Ground-glass opacity within the residually aerated lung at the right lung base. Debris within the bronchi of the medial right lower lobe. Calcifications in the right infrahilar region. The appearance is similar to chest CT performed 08/16/2021, however, there has been progression of volume loss of the right middle lobe. Hepatobiliary: Hypodensity within segment 4B adjacent to the falciform ligament, appears increased in size from the prior comparison CTs, now 2.9 cm on image 31 of series 2 high density material within the gallbladder. No inflammatory changes. Pancreas: Unremarkable Spleen: Multiple punctate calcifications within spleen parenchyma, compatible with granulomatous infection. Adrenals/Urinary Tract: - Right adrenal gland:  Unremarkable - Left adrenal gland: Unremarkable. - Right kidney: No hydronephrosis, nephrolithiasis, inflammation, or ureteral dilation. No focal lesion. - Left Kidney: No hydronephrosis, nephrolithiasis, inflammation, or ureteral dilation. No focal lesion. Stomach/Bowel: Visualized stomach unremarkable. Visualized small bowel decompressed with no focal inflammatory changes. Visualized colon unremarkable. Vascular/Lymphatic: No significant vascular calcifications. Edema within the leaves of the retroperitoneum, as well as edema within the body wall and the mesenteric fat most likely representing positive fluid balance. Trace fluid adjacent to the lesser curvature of the stomach. Other: None  Musculoskeletal: No acute displaced fracture.  Degenerative changes. IMPRESSION: No acute finding within the abdomen. There is questionable enlargement of focal hypodensity in segment 4B of the liver adjacent to the falciform ligament, poorly characterized on the noncontrast CT. While this may represent expansion of focal fatty change, a metastatic lesion can not be excluded. Further evaluation with imaging could be performed with either abdominal ultrasound and/or outpatient MRI (outpatient to maximize sensitivity/specificity). Similar appearance airspace and lymphatic disease of the right lower lobe, with increasing volume loss of the right middle lobe. As was previously stated, differential includes infection, recurrence/progressed malignancy, as well as background post treatment changes. Malignancy is of specific concern given the lymphatic disease of the right lower lobe and appearance of carcinomatosis. Small left pleural effusion with mesenteric and body wall edema, most compatible with positive fluid balance Additional ancillary findings as above. Electronically Signed   By: Corrie Mckusick D.O.   On: 08/21/2021 12:10   DG CHEST PORT 1 VIEW  Result Date: 08/20/2021 CLINICAL DATA:  Lung cancer.  Short of breath. EXAM: PORTABLE CHEST 1 VIEW COMPARISON:  CT 08/16/2021, chest radiograph 08/14/2021 FINDINGS: Now near complete opacification of the RIGHT hemithorax increased significantly comparison chest x-ray 6 days prior. Volume loss in the RIGHT hemithorax. LEFT mediastinal border normal. LEFT lung clear. IMPRESSION: Increasing opacification of the RIGHT hemithorax with near complete opacification increased significantly from radiograph and CT several days prior. Electronically Signed   By: Suzy Bouchard M.D.   On: 08/20/2021 17:45    Medications: I have reviewed the patient's current medications.   Assessment/Plan: This is a very pleasant 60 years old white female diagnosed with stage IV (T3, N2, M1  C) non-small cell lung cancer, adenocarcinoma with positive EGFR mutation with deletion in exon 19 in May 2021.  The patient was also found to have progressive disease to the brain in June 2022 status post SRS to metastatic brain lesions completed 05/24/2021. She has been on treatment with Tagrisso 80 mg p.o. daily since Apr 21, 2020 and has been tolerating her treatment fairly well. Over the last few weeks the patient has progressive shortness of breath as well as dysphagia and back pain. Her shortness of breath was initially thought to be related to [place] induced pneumonitis and the patient was treated with a tapered dose of prednisone with no improvement. Unfortunately repeat CT scan of the chest performed yesterday and in comparison to her previous scan in June 2022 shown above it showed significant consolidation and likely tumor progression in the posterior right lower lobe explaining the current dysphagia, shortness of breath as well as the back pain. The patient is currently undergoing palliative radiotherapy to the consolidative mass and the right lower lobe and tolerating her treatment well.  She has mild improvement in the dysphagia. We sent blood test to [facility] for molecular studies and to rule out the development of any resistant mutation to [place]. Another option would be consideration of repeat bronchoscopy and biopsy of the consolidated right lower lobe mass for tissue diagnosis and also to have more tissue for molecular studies at the blood test showed no new resistant mutation.  In the rare situation there could be also development of a small cell lung cancer as a resistant mutation in a patient with positive EGFR mutation treated with tyrosine kinase inhibitor and tissue biopsy will be the only option to confirm this diagnosis . Regarding the dysphagia, the patient is now more acceptable for the option of a PEG  tube placement for nutrition. For pain management she will continue  on fentanyl patch and Dilaudid. Thank you for taking good care of Mrs. Dumas.  I will continue to follow-up the patient with you and assist in her management on as-needed basis.  LOS: 7 days    Eilleen Kempf 08/22/2021

## 2021-08-23 ENCOUNTER — Inpatient Hospital Stay (HOSPITAL_COMMUNITY): Payer: 59

## 2021-08-23 ENCOUNTER — Ambulatory Visit
Admission: RE | Admit: 2021-08-23 | Discharge: 2021-08-23 | Disposition: A | Discharge status: Home / Self Care | Payer: 59 | Source: Ambulatory Visit | Attending: Radiation Oncology | Admitting: Radiation Oncology

## 2021-08-23 DIAGNOSIS — R1312 Dysphagia, oropharyngeal phase: Secondary | ICD-10-CM | POA: Diagnosis not present

## 2021-08-23 HISTORY — PX: IR GASTROSTOMY TUBE MOD SED: IMG625

## 2021-08-23 LAB — COMPREHENSIVE METABOLIC PANEL
ALT: 14 U/L (ref 0–44)
AST: 31 U/L (ref 15–41)
Albumin: 3.1 g/dL — ABNORMAL LOW (ref 3.5–5.0)
Alkaline Phosphatase: 167 U/L — ABNORMAL HIGH (ref 38–126)
Anion gap: 13 (ref 5–15)
BUN: 9 mg/dL (ref 8–23)
CO2: 26 mmol/L (ref 22–32)
Calcium: 9 mg/dL (ref 8.9–10.3)
Chloride: 99 mmol/L (ref 98–111)
Creatinine, Ser: 0.76 mg/dL (ref 0.44–1.00)
GFR, Estimated: >60 mL/min (ref >60)
Glucose, Bld: 91 mg/dL (ref 70–99)
Potassium: 3.8 mmol/L (ref 3.5–5.1)
Sodium: 138 mmol/L (ref 135–145)
Total Bilirubin: 0.9 mg/dL (ref 0.3–1.2)
Total Protein: 6.1 g/dL — ABNORMAL LOW (ref 6.5–8.1)

## 2021-08-23 LAB — TSH: TSH: 3.766 u[IU]/mL (ref 0.350–4.500)

## 2021-08-23 LAB — MAGNESIUM: Magnesium: 1.7 mg/dL (ref 1.7–2.4)

## 2021-08-23 MED ORDER — LIDOCAINE HCL 1 % IJ SOLN
INTRAMUSCULAR | Status: AC
  Filled 2021-08-23: qty 20

## 2021-08-23 MED ORDER — FENTANYL CITRATE (PF) 100 MCG/2ML IJ SOLN
INTRAMUSCULAR | Status: DC | PRN
  Administered 2021-08-23: 50 ug via INTRAVENOUS

## 2021-08-23 MED ORDER — LIDOCAINE VISCOUS HCL 2 % MT SOLN
OROMUCOSAL | Status: AC
  Filled 2021-08-23: qty 15

## 2021-08-23 MED ORDER — CARVEDILOL 3.125 MG PO TABS: 3.1250 mg | ORAL_TABLET | Freq: Two times a day (BID) | ORAL | Status: DC

## 2021-08-23 MED ORDER — MAGNESIUM SULFATE 2 GM/50ML IV SOLN
2.0000 g | Freq: Once | INTRAVENOUS | Status: AC
  Administered 2021-08-23: 2 g via INTRAVENOUS
  Filled 2021-08-23: qty 50

## 2021-08-23 MED ORDER — LABETALOL HCL 5 MG/ML IV SOLN
10.0000 mg | Freq: Once | INTRAVENOUS | Status: AC
  Administered 2021-08-23: 10 mg via INTRAVENOUS

## 2021-08-23 MED ORDER — FUROSEMIDE 10 MG/ML IJ SOLN
40.0000 mg | Freq: Once | INTRAMUSCULAR | Status: AC
  Administered 2021-08-23: 40 mg via INTRAVENOUS
  Filled 2021-08-23: qty 4

## 2021-08-23 MED ORDER — MIDAZOLAM HCL 2 MG/2ML IJ SOLN
INTRAMUSCULAR | Status: DC | PRN
  Administered 2021-08-23: 1 mg via INTRAVENOUS

## 2021-08-23 MED ORDER — CARVEDILOL 3.125 MG PO TABS
3.1250 mg | ORAL_TABLET | Freq: Two times a day (BID) | ORAL | Status: DC
  Administered 2021-08-24 (×2): 3.125 mg
  Filled 2021-08-23: qty 1

## 2021-08-23 MED ORDER — IOHEXOL 300 MG/ML  SOLN
20.0000 mL | Freq: Once | INTRAMUSCULAR | Status: AC | PRN
  Administered 2021-08-23: 10 mL

## 2021-08-23 MED ORDER — POTASSIUM CHLORIDE 10 MEQ/100ML IV SOLN
10.0000 meq | INTRAVENOUS | Status: AC
  Administered 2021-08-23 (×2): 10 meq via INTRAVENOUS
  Filled 2021-08-23 (×2): qty 100

## 2021-08-23 MED ORDER — DEXTROSE IN LACTATED RINGERS 5 % IV SOLN: INTRAVENOUS | Status: DC

## 2021-08-23 MED ORDER — FENTANYL 100 MCG/HR TD PT72
1.0000 | MEDICATED_PATCH | TRANSDERMAL | Status: DC
  Administered 2021-08-23: 1 via TRANSDERMAL
  Filled 2021-08-23: qty 1

## 2021-08-23 MED ORDER — LABETALOL HCL 5 MG/ML IV SOLN
10.0000 mg | Freq: Four times a day (QID) | INTRAVENOUS | Status: DC
  Administered 2021-08-23 (×2): 10 mg via INTRAVENOUS
  Filled 2021-08-23 (×2): qty 4

## 2021-08-23 MED ORDER — FENTANYL CITRATE (PF) 100 MCG/2ML IJ SOLN
INTRAMUSCULAR | Status: AC
  Filled 2021-08-23: qty 2

## 2021-08-23 MED ORDER — MIDAZOLAM HCL 2 MG/2ML IJ SOLN
INTRAMUSCULAR | Status: AC
  Filled 2021-08-23: qty 4

## 2021-08-23 MED ORDER — GLUCAGON HCL RDNA (DIAGNOSTIC) 1 MG IJ SOLR
INTRAMUSCULAR | Status: AC
  Filled 2021-08-23: qty 1

## 2021-08-23 MED ORDER — LABETALOL HCL 5 MG/ML IV SOLN
20.0000 mg | Freq: Four times a day (QID) | INTRAVENOUS | Status: DC
  Administered 2021-08-24 (×2): 20 mg via INTRAVENOUS
  Filled 2021-08-23 (×2): qty 4

## 2021-08-23 NOTE — Progress Notes (Signed)
Patient is weaker today. Reports not being able to walk to bathroom due to weakness and desaturations. She is increasingly frustrated. Her PEG tube was delayed yesterday and it is scheduled for today.She is starting to develop edema from likely low albumin. She is continuing to use hydromorphone PCA at consistently high rate.  Increase Fentanyl to 124mcg based on Hydromorphone PCA use-will reduce basal rate in 12 hours, will leave on demand dose. Continue Daily Decadron 4mg  IV Started scheduled Labetalol for control of significant HTN and Tachycardia. Encouraged use of Ativan for anxiety- discussed with RN giving pre dose for radiation and procedure today. Reviewed CT AbdPelvis- will defer discussion to oncology. No labs for review-but has been hypokalemia and hypomagnesemic- will order repletion of both and check those labs including a TSH, and also check LFTs . Added low rate of D5+LR to avoid hypoglacemia and replete K since she has received lasix. It is urgent that she get PEG placed ASAP so we can begin nutrition and try to restore her strength and QOL while she receives radiation and hopefully can improve esophageal obstruction.   Lane Hacker, DO Palliative Medicine  Time: 35 min Greater than 50%  of this time was spent counseling and coordinating care related to the above assessment and plan.

## 2021-08-23 NOTE — Progress Notes (Signed)
Pt returned from IR post PEG tube placement. Pt drowsy, easily arousable. Dressing C/D/I to mid abdomen. VS as charted. BP elevated. Dr Hilma Favors Dr Dahal aware. Orders received and implemented. Will recheck BP in an hour. Pt resting comfortably at present. [place] in reach.

## 2021-08-23 NOTE — Procedures (Signed)
Interventional Radiology Procedure Note  Procedure: Placement of percutaneous 66F balloon retention gastrostomy tube.  Complications: None  Recommendations: - NPO except for sips and chips remainder of today and overnight - Maintain G-tube to LWS until tomorrow morning  - May advance diet as tolerated and begin using tube tomorrow morning   Ruthann Cancer, MD

## 2021-08-23 NOTE — Progress Notes (Signed)
Patient BP 188/111. On call provider made aware. No new orders placed. Will continue to monitor closely.

## 2021-08-23 NOTE — Plan of Care (Signed)
  Problem: Health Behavior/Discharge Planning: Goal: Ability to manage health-related needs will improve Outcome: Progressing   Problem: Clinical Measurements: Goal: Ability to maintain clinical measurements within normal limits will improve Outcome: Progressing Goal: Will remain free from infection Outcome: Progressing Goal: Diagnostic test results will improve Outcome: Progressing Goal: Respiratory complications will improve Outcome: Progressing Goal: Cardiovascular complication will be avoided Outcome: Progressing   Problem: Coping: Goal: Level of anxiety will decrease Outcome: Progressing   Problem: Pain Managment: Goal: General experience of comfort will improve Outcome: Progressing Note: Fentanyl patch increased from 10mcg to 151mcg.    Problem: Safety: Goal: Ability to remain free from injury will improve Outcome: Progressing   Problem: Skin Integrity: Goal: Risk for impaired skin integrity will decrease Outcome: Progressing

## 2021-08-23 NOTE — Plan of Care (Signed)
  Problem: Clinical Measurements: Goal: Will remain free from infection Outcome: Progressing Goal: Diagnostic test results will improve Outcome: Progressing Goal: Respiratory complications will improve Outcome: Progressing   Problem: Coping: Goal: Level of anxiety will decrease Outcome: Progressing   Problem: Elimination: Goal: Will not experience complications related to bowel motility Outcome: Progressing   Problem: Safety: Goal: Ability to remain free from injury will improve Outcome: Progressing

## 2021-08-23 NOTE — Progress Notes (Signed)
Dilaudid PCA syringe changed, 57ml wasted. 50mcg Fentanyl patch removed from patient left chest wall. Both wastes witnessed by D. Enzo Montgomery, RN

## 2021-08-23 NOTE — Progress Notes (Signed)
PROGRESS NOTE  Brittany Mccarty  DOB: December 13, 1960  PCP: Kathyrn Lass CZY:[id number]  DOA: 08/14/2021  LOS: 8 days  Hospital Day: 10   Chief Complaint  Patient presents with   Dysphagia    Brief narrative: Brittany Mccarty is a 60 y.o. female with PMH significant for metastatic lung cancer, pulmonary embolism. Patient presented to the ED on 9/12 with complaint of progressive dysphagia to a point where it is difficult even to swallow the medications..  Also complained of wheezing, productive cough. Recent hospitalization 8/27-8/29 for acute pulm embolism and dysphagia.  On admission her heart rate was elevated to 116. Chest x-ray showed right lower lobe opacity, positive volume loss and mediastinal retraction to the right side. Admitted to hospitalist service for progressive dysphagia and respiratory failure GI and pulmonary consultations were obtained. See below for details.  Subjective: Patient was seen and examined this morning.  Lying down in bed.  On low-flow oxygen. Yesterday, she tried to ambulate and her oxygen saturation dropped.   Could not get PEG tube placed yesterday because IR got busy with an emergency procedure.  PEG tube planned for this afternoon. Last night her blood pressure was up to 180s.  Persistently tachycardic close to 110 bpm. Somewhat tearful and overwhelmed this morning.  Assessment/Plan: Esophageal stenosis -Patient presented with progressive dysphagia for several days  -Underwent EGD on 9/13, noted to have external compression from progressive lung cancer.  Unable to pass scope.   -She had repeat EGD and esophageal dilatation done on 9/15.  Esophageal biopsy showed fibrotic stroma and no intrinsic malignancy. -Patient was initially hesitant but has now agreed to PEG tube placement.  PEG tube planned for today. -Continue pain management.  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  -CT chest 09/15 with tumor progression in the posterior right lower lobe  -Pulmonology following.  Noted a plan to do a bronchoscopy likely as an inpatient. -Continue 2 L oxygen by nasal cannula.  Encourage ambulation. -Patient also is getting radiation treatment.  Hx of pulmonary embolism -Currently Eliquis is on hold because of high risk of hemoptysis with persistent cough.    Elevated blood pressure -Blood pressure remains elevated to 160s and 150s.  Last night blood pressure was as high as 180s.  Because of generalized anasarca, I decided to give her Lasix 40 mg IV 1 dose today.  Once PEG tube is placed, I will also put her on Coreg 3.125 milligrams twice daily.   Acute respiratory failure with hypoxia -Currently requiring 2 L oxygen nasal cannula.  Desatted on ambulation.  Expect improvement with IV Lasix.  Continue to monitor.  Wean down as tolerated.  Severe calorie protein malnutrition -Nutrition consult appreciated. Pending PEG tube placement.     Mobility: Encourage ambulation Code Status:   Code Status: Full Code  Nutritional status: Body mass index is 30.12 kg/m. Nutrition Problem: Severe Malnutrition Etiology: acute illness, dysphagia Signs/Symptoms: percent weight loss, energy intake < or equal to 50% for > or equal to 5 days Percent weight loss: 7 % Diet:  Diet Order             Diet NPO time specified  Diet effective midnight                  DVT prophylaxis:  enoxaparin (LOVENOX) injection 40 mg Start: 08/23/21 1400 Place and maintain sequential compression device Start: 08/19/21 0918   Antimicrobials: None Fluid:  Not on IV fluid Consultants: Pulmonology, oncology, radiation oncology Family Communication: None at bedside  Status is: Inpatient  Remains inpatient appropriate because: Pending PEG tube placement, palliative radiation  Dispo: The patient is from: Home              Anticipated d/c is to: Home next  week after radiation is complete.              Patient currently is not medically stable to d/c.   Difficult to place patient No     Infusions:   dextrose 5% lactated ringers     lactated ringers 0 mL (08/15/21 1915)   magnesium sulfate bolus IVPB     potassium chloride     promethazine (PHENERGAN) injection (IM or IVPB) 25 mg (08/23/21 0337)   vancomycin      Scheduled Meds:  [START ON 08/24/2021] carvedilol  3.125 mg Per Tube BID WC   dexamethasone (DECADRON) injection  4 mg Intravenous Q24H   enoxaparin (LOVENOX) injection  40 mg Subcutaneous Q24H   fentaNYL  1 patch Transdermal Q72H   HYDROmorphone   Intravenous Q4H   labetalol  10 mg Intravenous Q6H   methylnaltrexone  12 mg Subcutaneous Q48H   pantoprazole (PROTONIX) IV  40 mg Intravenous Q12H   senna-docusate  1 tablet Oral QHS   thiamine injection  100 mg Intravenous Daily    Antimicrobials: Anti-infectives (From admission, onward)    Start     Dose/Rate Route Frequency Ordered Stop   08/22/21 1500  vancomycin (VANCOCIN) IVPB 1000 mg/200 mL premix        1,000 mg 200 mL/hr over 60 Minutes Intravenous To Radiology 08/21/21 1328 08/23/21 1500       PRN meds: acetaminophen, benzonatate, bisacodyl, cyclobenzaprine, diphenhydrAMINE **OR** diphenhydrAMINE, guaiFENesin, hydrALAZINE, ipratropium-albuterol, LORazepam, naloxone **AND** sodium chloride flush, ondansetron **OR** ondansetron (ZOFRAN) IV, promethazine (PHENERGAN) injection (IM or IVPB), promethazine-codeine, traZODone   Objective: Vitals:   08/23/21 0846 08/23/21 1224  BP: (!) 175/118 (!) 162/111  Pulse: (!) 109 94  Resp:  17  Temp: 98.4 F (36.9 C) 97.9 F (36.6 C)  SpO2: 100% 96%    Intake/Output Summary (Last 24 hours) at 08/23/2021 1247 Last data filed at 08/23/2021 1228 Gross per 24 hour  Intake 400 ml  Output 2750 ml  Net -2350 ml    Filed Weights   08/15/21 0406 08/15/21 1222 08/22/21 1606  Weight: 78.6 kg 78.6 kg 82.1 kg   Weight  change:  Body mass index is 30.12 kg/m.   Physical Exam: General exam: Pleasant, middle-aged Caucasian female.  Sitting up in chair.  Currently pain controlled.  On low-flow oxygen. Skin: No rashes, lesions or ulcers. HEENT: Atraumatic, normocephalic, no obvious bleeding Lungs: Clear to auscultation bilaterally CVS: Regular rate and rhythm, no murmur GI/Abd soft, nontender, nondistended, bowel sound present CNS: Alert, awake, oriented x3 Psychiatry: Depressed look Extremities: Trace bilateral pedal edema, no calf tenderness  Data Review: I have personally reviewed the laboratory data and studies available.  Recent Labs  Lab 08/18/21 0337 08/18/21 0940 08/19/21 0346 08/20/21 0346 08/22/21 0358  WBC 4.6 6.1 5.9 4.8 5.2  NEUTROABS  --  5.2  --   --  4.7  HGB 12.2 13.2 12.5 12.8 11.8*  HCT 36.2 39.5 37.7 39.3 35.7*  MCV 91.4 91.0 92.6 94.5 92.5  PLT 141* 154 158 124* 147*    Recent Labs  Lab 08/17/21 0340 08/18/21 0337 08/18/21 0940 08/19/21 0346 08/20/21 0346 08/21/21 0330 08/22/21 0358 08/23/21  1005  NA 139 136 141 138 141 141 140  --   K 3.5 3.1* 3.5 3.7 3.6 3.6 3.2*  --   CL 103 104 104 104 107 105 102  --   CO2 27 26 27 26 26 29 30   --   GLUCOSE 99 103* 100* 126* 111* 161* 127*  --   BUN <5* <5* <4* <5* <5* 5* 7*  --   CREATININE 0.92 0.79 0.79 0.69 0.57 0.70 0.72  --   CALCIUM 9.0 8.4* 9.4 8.7* 8.6* 9.0 8.8*  --   MG 1.9 1.7  --  1.9 1.8  --   --  1.7  PHOS  --   --   --   --   --   --  3.4  --      F/u labs ordered Unresulted Labs (From admission, onward)     Start     Ordered   08/23/21 1244  Comprehensive metabolic panel  Once,   R       Question:  Specimen collection method  Answer:  Lab=Lab collect   08/23/21 1243   08/23/21 1244  TSH  Once,   R       Question:  Specimen collection method  Answer:  Lab=Lab collect   08/23/21 1243            Signed, Terrilee Croak, MD [facility] 08/23/2021

## 2021-08-24 ENCOUNTER — Ambulatory Visit
Admission: RE | Admit: 2021-08-24 | Discharge: 2021-08-24 | Disposition: A | Discharge status: Home / Self Care | Payer: 59 | Source: Ambulatory Visit | Attending: Radiation Oncology | Admitting: Radiation Oncology

## 2021-08-24 DIAGNOSIS — R1312 Dysphagia, oropharyngeal phase: Secondary | ICD-10-CM | POA: Diagnosis not present

## 2021-08-24 DIAGNOSIS — C3491 Malignant neoplasm of unspecified part of right bronchus or lung: Secondary | ICD-10-CM | POA: Diagnosis not present

## 2021-08-24 LAB — MAGNESIUM
Magnesium: 2.1 mg/dL (ref 1.7–2.4)
Magnesium: 2.1 mg/dL (ref 1.7–2.4)

## 2021-08-24 LAB — PHOSPHORUS
Phosphorus: 3.1 mg/dL (ref 2.5–4.6)
Phosphorus: 3.1 mg/dL (ref 2.5–4.6)

## 2021-08-24 LAB — GLUCOSE, CAPILLARY: Glucose-Capillary: 115 mg/dL — ABNORMAL HIGH (ref 70–99)

## 2021-08-24 MED ORDER — MAGIC MOUTHWASH W/LIDOCAINE
5.0000 mL | Freq: Three times a day (TID) | ORAL | Status: DC | PRN
  Filled 2021-08-24: qty 5

## 2021-08-24 MED ORDER — FREE WATER
100.0000 mL | Status: DC
  Administered 2021-08-24 – 2021-08-31 (×43): 100 mL

## 2021-08-24 MED ORDER — VITAL HIGH PROTEIN PO LIQD: 1000.0000 mL | ORAL | Status: DC

## 2021-08-24 MED ORDER — TRAZODONE HCL 50 MG PO TABS
50.0000 mg | ORAL_TABLET | Freq: Every day | ORAL | Status: DC
  Administered 2021-08-24 – 2021-08-30 (×7): 50 mg
  Filled 2021-08-24 (×7): qty 1

## 2021-08-24 MED ORDER — CARVEDILOL 12.5 MG PO TABS
12.5000 mg | ORAL_TABLET | Freq: Two times a day (BID) | ORAL | Status: DC
  Administered 2021-08-25 – 2021-08-31 (×13): 12.5 mg
  Filled 2021-08-24 (×14): qty 1

## 2021-08-24 MED ORDER — PROSOURCE TF PO LIQD
45.0000 mL | Freq: Three times a day (TID) | ORAL | Status: DC
  Administered 2021-08-24 – 2021-08-31 (×20): 45 mL
  Filled 2021-08-24 (×22): qty 45

## 2021-08-24 MED ORDER — LABETALOL HCL 5 MG/ML IV SOLN: 10.0000 mg | INTRAVENOUS | Status: DC | PRN

## 2021-08-24 MED ORDER — HYDROMORPHONE 1 MG/ML IV SOLN
INTRAVENOUS | Status: DC
  Administered 2021-08-24: 0 mg via INTRAVENOUS
  Administered 2021-08-24: 1.6 mg via INTRAVENOUS
  Administered 2021-08-25: 1.5 mg via INTRAVENOUS
  Administered 2021-08-25: 4.5 mg via INTRAVENOUS
  Administered 2021-08-25: 2 mg via INTRAVENOUS
  Administered 2021-08-25: 1 mg via INTRAVENOUS
  Administered 2021-08-25: 3.5 mg via INTRAVENOUS
  Administered 2021-08-25: 2 mg via INTRAVENOUS
  Administered 2021-08-26: 4.5 mg via INTRAVENOUS
  Administered 2021-08-26: 5.5 mg via INTRAVENOUS
  Administered 2021-08-26 (×2): 3.5 mg via INTRAVENOUS
  Administered 2021-08-26: 1 mg via INTRAVENOUS
  Administered 2021-08-27: 2.5 mg via INTRAVENOUS
  Administered 2021-08-27: 30 mg via INTRAVENOUS
  Administered 2021-08-27: 2 mg via INTRAVENOUS
  Filled 2021-08-24 (×3): qty 30

## 2021-08-24 MED ORDER — OSMOLITE 1.5 CAL PO LIQD
1000.0000 mL | ORAL | Status: DC
  Administered 2021-08-24 – 2021-08-27 (×4): 1000 mL
  Filled 2021-08-24 (×6): qty 1000

## 2021-08-24 NOTE — Progress Notes (Signed)
NAME:  Brittany Mccarty, MRN:  [id number], DOB:  January 24, 1961, LOS: 9 ADMISSION DATE:  08/14/2021, CONSULTATION DATE:  08/21/21 REFERRING MD:  Dr Julien Nordmann, CHIEF COMPLAINT:  Progressive lung cancer   History of Present Illness:  60 year old woman, never smoker who has a diagnosis of stage IV adenocarcinoma (EGFR mutation) diagnosed 04/2020.  Initially was found to have a large right infrahilar mass with associated mediastinal adenopathy, mets to bone, liver, brain.  She underwent brain SRS (completed 05/2021), has been on Tagrisso maintenance.  Course complicated by pulmonary embolism on Eliquis as well as dysphagia, odynophagia, some cough and choking when swallowing. I saw her in the office on 08/11/2021 after her Tagrisso had to be held- she had right upper lobe and right middle lobe groundglass infiltrates that were concerning for either therapy-induced pneumonitis or aspiration pneumonitis.  She was treated with corticosteroids as well as empiric antibiotics.  I had planned for a repeat CT scan of the chest to look for resolution of her infiltrates.  If they persisted we were going to discuss possible bronchoscopy. She has since been admitted 9/12 with significant progression of her dysphagia and odynophagia.   Her most recent CT chest was 08/16/2021, reviewed by me, shows the persistent dense right basilar medial opacity and right hilar opacity, certainly progressed compared with scans dating back to June, right pleural thickening with a small effusion, right middle lobe and upper lobe groundglass airspace disease.  No clear impact of malignancy on the esophagus although there is pooled material, significant rightward shift of the mediastinum  Pertinent  Medical History   Past Medical History:  Diagnosis Date   NSCL CA DX'D 04/07/20  PE on Eliquis   Significant Hospital Events: Including procedures, antibiotic start and stop dates in addition to other pertinent events   Receiving palliative XRT to  area of presumed esophageal external compression PEG placed 9/21  Interim History / Subjective:   Started to feel little bit better, able to better take ice chips, sips of water PEG placed yesterday 9/21   Objective   Blood pressure (!) 160/112, pulse 93, temperature 97.7 F (36.5 C), temperature source Oral, resp. rate 17, height 5' 5"  (1.651 m), weight 82.1 kg, SpO2 100 %.        Intake/Output Summary (Last 24 hours) at 08/24/2021 1011 Last data filed at 08/24/2021 0914 Gross per 24 hour  Intake 1028.3 ml  Output 3625 ml  Net -2596.7 ml   Filed Weights   08/15/21 0406 08/15/21 1222 08/22/21 1606  Weight: 78.6 kg 78.6 kg 82.1 kg    Examination: General: Chronically ill-appearing woman, laying in bed, comfortable HENT: Strong voice, oropharynx clear Lungs: Coarse bilaterally, no wheezing Cardiovascular: Distant, regular, no murmur Abdomen: Nondistended.  New PEG tube in place with site clean.  Positive bowel sounds Extremities: No edema Neuro: Awake, alert, nonfocal  Resolved Hospital Problem list     Assessment & Plan:  Stage IV adenocarcinoma that appears to have locally progressed based on increased size of her right lower lobe opacity, pleural disease.  There are right upper lobe and right middle lobe groundglass opacities that could be consistent with lymphangitic spread as well.    Have been discussing bronchoscopy with her and also Dr. Julien Nordmann.  She has already had blood draw for Gardant molecular testing.  There would probably be some value in repeat right lower lobe transbronchial biopsies for more molecular studies and to rule out a transformation from adenocarcinoma to a new tissue type such as  small cell.  At this time she still wants to hold off, wait for the serological molecular testing to come back to see if it guide therapy.  She is not completely closed to a possible bronchoscopy at some point but she does not want to arrange at this time.  I can revisit with  her in the outpatient setting whenever she and Dr. Julien Nordmann agree that it is indicated.  Labs   CBC: Recent Labs  Lab 08/18/21 0337 08/18/21 0940 08/19/21 0346 08/20/21 0346 08/22/21 0358  WBC 4.6 6.1 5.9 4.8 5.2  NEUTROABS  --  5.2  --   --  4.7  HGB 12.2 13.2 12.5 12.8 11.8*  HCT 36.2 39.5 37.7 39.3 35.7*  MCV 91.4 91.0 92.6 94.5 92.5  PLT 141* 154 158 124* 147*    Basic Metabolic Panel: Recent Labs  Lab 08/18/21 0337 08/18/21 0940 08/19/21 0346 08/20/21 0346 08/21/21 0330 08/22/21 0358 08/23/21 1005 08/23/21 1253  NA 136   < > 138 141 141 140  --  138  K 3.1*   < > 3.7 3.6 3.6 3.2*  --  3.8  CL 104   < > 104 107 105 102  --  99  CO2 26   < > 26 26 29 30   --  26  GLUCOSE 103*   < > 126* 111* 161* 127*  --  91  BUN <5*   < > <5* <5* 5* 7*  --  9  CREATININE 0.79   < > 0.69 0.57 0.70 0.72  --  0.76  CALCIUM 8.4*   < > 8.7* 8.6* 9.0 8.8*  --  9.0  MG 1.7  --  1.9 1.8  --   --  1.7  --   PHOS  --   --   --   --   --  3.4  --   --    < > = values in this interval not displayed.   GFR: Estimated Creatinine Clearance: 78.1 mL/min (by C-G formula based on SCr of 0.76 mg/dL). Recent Labs  Lab 08/18/21 0940 08/19/21 0346 08/20/21 0346 08/22/21 0358  WBC 6.1 5.9 4.8 5.2    Liver Function Tests: Recent Labs  Lab 08/18/21 0940 08/23/21 1253  AST 16 31  ALT 8 14  ALKPHOS 126 167*  BILITOT 0.6 0.9  PROT 6.3* 6.1*  ALBUMIN 3.1* 3.1*   No results for input(s): LIPASE, AMYLASE in the last 168 hours. No results for input(s): AMMONIA in the last 168 hours.  ABG No results found for: PHART, PCO2ART, PO2ART, HCO3, TCO2, ACIDBASEDEF, O2SAT   Coagulation Profile: Recent Labs  Lab 08/22/21 0358  INR 1.2     Critical care time: NA     Baltazar Apo, MD, PhD 08/24/2021, 10:11 AM [facility] [phone] or if no answer before 7:00PM call [phone] For any issues after 7:00PM please call eLink [phone]

## 2021-08-24 NOTE — Progress Notes (Signed)
Referring Physician(s): * No referring provider recorded for this case *  Supervising Physician: Jacqulynn Cadet  Patient Status:  Brittany Mccarty - In-pt  Chief Complaint:   1 day status post placement of percutaneous 80F balloon retention gastrostomy tube  Subjective:  [name] was laying in bed and appearing comfortable on exam.  Incision   Allergies: Other and Cefdinir  Medications: Prior to Admission medications   Medication Sig Start Date End Date Taking? Authorizing Provider  apixaban (ELIQUIS) 5 MG TABS tablet Take 2 tablets (10 mg total) by mouth 2 (two) times daily for 7 days, THEN 1 tablet (5 mg total) 2 (two) times daily for 28 days. Patient taking differently: 10mg  orally daily currently 07/31/21 09/04/21 Yes Sheikh, Omair Latif, DO  benzonatate (TESSALON) 100 MG capsule Take 1 capsule (100 mg total) by mouth 3 (three) times daily as needed for cough. 07/28/21  Yes Heilingoetter, Cassandra L, PA-C  cyclobenzaprine (FLEXERIL) 10 MG tablet Take 1 tablet (10 mg total) by mouth 2 (two) times daily as needed for muscle spasms. 07/30/21  Yes McDonald, Mia A, PA-C  fentaNYL (DURAGESIC) 50 MCG/HR Place 1 patch onto the skin every 3 (three) days. 08/11/21  Yes Heilingoetter, Cassandra L, PA-C  omeprazole (PRILOSEC) 20 MG capsule Take 1 capsule (20 mg total) by mouth daily. 07/24/21  Yes Heilingoetter, Cassandra L, PA-C  ondansetron (ZOFRAN) 4 MG tablet Take 4 mg by mouth every 8 (eight) hours as needed for nausea or vomiting. 04/08/20  Yes [provider]  osimertinib mesylate (TAGRISSO) 80 MG tablet Take 1 tablet (80 mg total) by mouth daily. 06/08/21  Yes Curt Bears, MD  oxyCODONE (OXY IR/ROXICODONE) 5 MG immediate release tablet Take 1 tablet (5 mg total) by mouth 3 (three) times daily between meals as needed for severe pain. 07/31/21  Yes Heilingoetter, Cassandra L, PA-C  promethazine-codeine (PHENERGAN WITH CODEINE) 6.25-10 MG/5ML syrup Take 5-10 mLs by mouth every 6 (six)  hours as needed for cough. 08/11/21  Yes Collene Gobble, MD  lidocaine (XYLOCAINE) 2 % solution Use as directed 15 mLs in the mouth or throat as needed for mouth pain. Patient not taking: No sig reported 07/30/21   McDonald, Mia A, PA-C  predniSONE (DELTASONE) 20 MG tablet Take 4 tablets (80 mg) daily for 1 week, followed by 2 tablets (40 mg) daily for 1 week, 1 tablet (20 mg) for 1 week. Patient not taking: Reported on 08/14/2021 07/24/21   Heilingoetter, Cassandra L, PA-C     Vital Signs: BP (!) 160/112 (BP Location: Right Arm)   Pulse 93   Temp 97.7 F (36.5 C) (Oral)   Resp 20   Ht 5\' 5"  (1.651 m)   Wt 181 lb (82.1 kg)   SpO2 100%   BMI 30.12 kg/m   Physical Exam Vitals reviewed.  Skin:    Comments: G-tube with bile in the tubing.  Bandage in place, clean, and dry.  No evidence of erythema or other concerns of the skin at the incision site.      Imaging: CT ABDOMEN WO CONTRAST  Result Date: 08/21/2021 CLINICAL DATA:  60 year old female referred for possible gastrostomy tube placement EXAM: CT ABDOMEN WITHOUT CONTRAST TECHNIQUE: Multidetector CT imaging of the abdomen was performed following the standard protocol without IV contrast. COMPARISON:  Chest CT 08/16/2021, abdominal CT 07/19/2021 FINDINGS: Lower chest: Small left-sided pleural effusion and trace right-sided pleural effusion. Consolidative airspace disease at the base of the right lung, with volume loss of the visualized right  middle lobe, right lower lobe. Pattern lymphatic involvement includes interlobular septal thickening, thickening of the visualized fissure. Ground-glass opacity within the residually aerated lung at the right lung base. Debris within the bronchi of the medial right lower lobe. Calcifications in the right infrahilar region. The appearance is similar to chest CT performed 08/16/2021, however, there has been progression of volume loss of the right middle lobe. Hepatobiliary: Hypodensity within segment 4B  adjacent to the falciform ligament, appears increased in size from the prior comparison CTs, now 2.9 cm on image 31 of series 2 high density material within the gallbladder. No inflammatory changes. Pancreas: Unremarkable Spleen: Multiple punctate calcifications within spleen parenchyma, compatible with granulomatous infection. Adrenals/Urinary Tract: - Right adrenal gland:  Unremarkable - Left adrenal gland: Unremarkable. - Right kidney: No hydronephrosis, nephrolithiasis, inflammation, or ureteral dilation. No focal lesion. - Left Kidney: No hydronephrosis, nephrolithiasis, inflammation, or ureteral dilation. No focal lesion. Stomach/Bowel: Visualized stomach unremarkable. Visualized small bowel decompressed with no focal inflammatory changes. Visualized colon unremarkable. Vascular/Lymphatic: No significant vascular calcifications. Edema within the leaves of the retroperitoneum, as well as edema within the body wall and the mesenteric fat most likely representing positive fluid balance. Trace fluid adjacent to the lesser curvature of the stomach. Other: None Musculoskeletal: No acute displaced fracture.  Degenerative changes. IMPRESSION: No acute finding within the abdomen. There is questionable enlargement of focal hypodensity in segment 4B of the liver adjacent to the falciform ligament, poorly characterized on the noncontrast CT. While this may represent expansion of focal fatty change, a metastatic lesion can not be excluded. Further evaluation with imaging could be performed with either abdominal ultrasound and/or outpatient MRI (outpatient to maximize sensitivity/specificity). Similar appearance airspace and lymphatic disease of the right lower lobe, with increasing volume loss of the right middle lobe. As was previously stated, differential includes infection, recurrence/progressed malignancy, as well as background post treatment changes. Malignancy is of specific concern given the lymphatic disease of the  right lower lobe and appearance of carcinomatosis. Small left pleural effusion with mesenteric and body wall edema, most compatible with positive fluid balance Additional ancillary findings as above. Electronically Signed   By: Corrie Mckusick D.O.   On: 08/21/2021 12:10   IR GASTROSTOMY TUBE MOD SED  Result Date: 08/24/2021 INDICATION: 60 year old female with history of severe dysphagia requiring percutaneous enteric access for supplemental nutrition. EXAM: PERC PLACEMENT GASTROSTOMY MEDICATIONS: Vancomycin 1 gm IV; Antibiotics were administered within 1 hour of the procedure. ANESTHESIA/SEDATION: Versed 2 mg IV; Fentanyl 100 mcg IV Moderate Sedation Time:  11 The patient was continuously monitored during the procedure by the interventional radiology nurse under my direct supervision. CONTRAST:  45mL OMNIPAQUE IOHEXOL 300 MG/ML SOLN - administered into the gastric lumen. FLUOROSCOPY TIME:  Fluoroscopy Time: 0 minutes 36 seconds (1 mGy). COMPLICATIONS: None immediate. PROCEDURE: Informed written consent was obtained from the patient after a thorough discussion of the procedural risks, benefits and alternatives. All questions were addressed. Maximal Sterile barrier Technique was utilized including caps, mask, sterile gowns, sterile gloves, sterile drape, hand hygiene and skin antiseptic. A timeout was performed prior to the initiation of the procedure. The patient was placed on the procedure table in the supine position. Pre-procedure abdominal film confirmed visualization of the transverse colon. An angled 5-French catheter was passed through the nares into the stomach. The patient was prepped and draped in usual sterile fashion. The stomach was insufflated with air via the indwelling nasogastric tube. Under fluoroscopy, a puncture site was selected and local analgesia  achieved with 1% lidocaine infiltrated subcutaneously. Under fluoroscopic guidance, a gastropexy needle was passed into the stomach and the T-bar  suture was released. Entry into the stomach was confirmed with fluoroscopy, aspiration of air, and injection of contrast material. This was repeated with an additional gastropexy suture (for a total of 2 fasteners). At the Mccarty of these gastropexy sutures, a dermatotomy was performed. An 18 gauge needle was passed into the stomach at the site of this dermatotomy, and position within the gastric lumen again confirmed under fluoroscopy using aspiration of air and contrast injection. An Amplatz guidewire was passed through this needle and intraluminal placement within the stomach was confirmed by fluoroscopy. The needle was removed. Over the guidewire, the percutaneous tract was dilated using a 10 mm non-compliant balloon. The balloon was deflated, then pushed into the gastric lumen followed in concert by the 20 Fr gastrostomy tube. The retention balloon of the percutaneous gastrostomy tube was inflated with 20 mL of sterile water. The tube was withdrawn until the retention balloon was at the edge of the gastric lumen. The external bumper was brought to the abdominal wall. Contrast was injected through the gastrostomy tube, confirming intraluminal positioning. The patient tolerated the procedure well without any immediate post-procedural complications. IMPRESSION: Technically successful placement of 20 Fr gastrostomy tube. Ruthann Cancer, MD [facility] Electronically Signed   By: Ruthann Cancer M.D.   On: 08/24/2021 08:37   DG CHEST PORT 1 VIEW  Result Date: 08/20/2021 CLINICAL DATA:  Lung cancer.  Short of breath. EXAM: PORTABLE CHEST 1 VIEW COMPARISON:  CT 08/16/2021, chest radiograph 08/14/2021 FINDINGS: Now near complete opacification of the RIGHT hemithorax increased significantly comparison chest x-ray 6 days prior. Volume loss in the RIGHT hemithorax. LEFT mediastinal border normal. LEFT lung clear. IMPRESSION: Increasing opacification of the RIGHT  hemithorax with near complete opacification increased significantly from radiograph and CT several days prior. Electronically Signed   By: Suzy Bouchard M.D.   On: 08/20/2021 17:45    Labs:  CBC: Recent Labs    08/18/21 0940 08/19/21 0346 08/20/21 0346 08/22/21 0358  WBC 6.1 5.9 4.8 5.2  HGB 13.2 12.5 12.8 11.8*  HCT 39.5 37.7 39.3 35.7*  PLT 154 158 124* 147*    COAGS: Recent Labs    08/22/21 0358  INR 1.2    BMP: Recent Labs    08/20/21 0346 08/21/21 0330 08/22/21 0358 08/23/21 1253  NA 141 141 140 138  K 3.6 3.6 3.2* 3.8  CL 107 105 102 99  CO2 26 29 30 26   GLUCOSE 111* 161* 127* 91  BUN <5* 5* 7* 9  CALCIUM 8.6* 9.0 8.8* 9.0  CREATININE 0.57 0.70 0.72 0.76  GFRNONAA >60 >60 >60 >60    LIVER FUNCTION TESTS: Recent Labs    07/29/21 2320 08/14/21 1407 08/18/21 0940 08/23/21 1253  BILITOT 0.5 1.0 0.6 0.9  AST 21 26 16 31   ALT 19 16 8 14   ALKPHOS 70 98 126 167*  PROT 6.7 7.1 6.3* 6.1*  ALBUMIN 3.8 3.9 3.1* 3.1*    Assessment and Plan:  1 day status post percutaneous balloon retention g-tube, doing well.  Will start feeds today.  Electronically Signed: Pasty Spillers, PA 08/24/2021, 1:44 PM   I spent a total of  10  at the the patient's bedside AND on the patient's hospital floor or unit, greater than 50% of which was counseling/coordinating care of her g-tube.

## 2021-08-24 NOTE — Progress Notes (Signed)
PROGRESS NOTE  Brittany Mccarty  DOB: 02-03-1961  PCP: Kathyrn Lass NWG:[id number]  DOA: 08/14/2021  LOS: 9 days  Hospital Day: 88   Chief Complaint  Patient presents with   Dysphagia    Brief narrative: Brittany Mccarty is a 60 y.o. female with PMH significant for metastatic lung cancer, pulmonary embolism. Patient presented to the ED on 9/12 with complaint of progressive dysphagia to a point where it is difficult even to swallow the medications..  Also complained of wheezing, productive cough. Recent hospitalization 8/27-8/29 for acute pulm embolism and dysphagia.  On admission her heart rate was elevated to 116. Chest x-ray showed right lower lobe opacity, positive volume loss and mediastinal retraction to the right side. Admitted to hospitalist service for progressive dysphagia and respiratory failure GI and pulmonary consultations were obtained. See below for details.  Subjective: Patient was seen and examined this morning.   Lying on bed.  On low-flow oxygen.  Not in distress. Pain controlled on Dilaudid PCA. She got PEG tube placed yesterday.  Dietitian consulted. Overnight, heart rate in 90s, blood pressure in 130s to 160s  Assessment/Plan: Esophageal stenosis -Patient presented with progressive dysphagia for several days  -Underwent EGD on 9/13, noted to have external compression from progressive lung cancer.  Unable to pass scope.   -She had repeat EGD and esophageal dilatation done on 9/15.  Esophageal biopsy showed fibrotic stroma and no intrinsic malignancy. -PEG tube placed on 9/21. -Nutrition consult pending.  Currently on IV hydration with dextrose.  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  -CT chest 09/15 with tumor progression in the posterior right lower lobe  -Patient also is getting radiation treatment.  Noted a plan to continue till next  week. -Pulmonology following.  Noted a plan to do a bronchoscopy for sampling and molecular testing.  Patient prefers to do it outpatient.. -Continue 2 L oxygen by nasal cannula.  Encourage ambulation.  Hx of pulmonary embolism -Currently Eliquis is on hold because of high risk of hemoptysis with persistent cough.  Patient's cough has improved.  I would reinitiate Eliquis at this time.  Elevated blood pressure -Patient getting IV Lasix intermittently.  She was also started on IV labetalol scheduled last night.  With insertion of PEG tube, I would use Coreg 3.125 mg twice daily with as needed labetalol and as needed Lasix.  Continue to monitor.  Acute respiratory failure with hypoxia -Currently requiring 2 L oxygen nasal cannula.  Oxygen saturation dropped on ambulation.  Expect improvement with IV Lasix.  Continue to monitor.  Wean down as tolerated.  Severe calorie protein malnutrition -Nutrition consult appreciated.  Underwent PEG tube placement yesterday.  Pending tube feeding initiation.   Mobility: Encourage ambulation Code Status:   Code Status: Full Code  Nutritional status: Body mass index is 30.12 kg/m. Nutrition Problem: Severe Malnutrition Etiology: acute illness, dysphagia Signs/Symptoms: percent weight loss, energy intake < or equal to 50% for > or equal to 5 days Percent weight loss: 7 % Diet:  Diet Order             Diet NPO time specified  Diet effective midnight                  DVT prophylaxis:  enoxaparin (LOVENOX) injection 40 mg Start: 08/23/21 1400 Place and maintain sequential compression device Start: 08/19/21 0918   Antimicrobials: None Fluid: Currently on D5 LR at 50 mill per hour  Consultants: Pulmonology, oncology, radiation oncology Family Communication: None at bedside  Status is: Inpatient  Remains inpatient appropriate because: To feeding to be initiated, palliative radiation to continue  Dispo: The patient is from: Home               Anticipated d/c is to: Home next week after radiation is complete.              Patient currently is not medically stable to d/c.   Difficult to place patient No     Infusions:   dextrose 5% lactated ringers 50 mL/hr at 08/23/21 1246   feeding supplement (OSMOLITE 1.5 CAL)     lactated ringers 0 mL (08/15/21 1915)   promethazine (PHENERGAN) injection (IM or IVPB) 25 mg (08/23/21 2040)    Scheduled Meds:  carvedilol  3.125 mg Per Tube BID WC   dexamethasone (DECADRON) injection  4 mg Intravenous Q24H   enoxaparin (LOVENOX) injection  40 mg Subcutaneous Q24H   feeding supplement (PROSource TF)  45 mL Per Tube TID   fentaNYL  1 patch Transdermal Q72H   free water  100 mL Per Tube Q4H   HYDROmorphone   Intravenous Q4H   methylnaltrexone  12 mg Subcutaneous Q48H   pantoprazole (PROTONIX) IV  40 mg Intravenous Q12H   senna-docusate  1 tablet Oral QHS   thiamine injection  100 mg Intravenous Daily    Antimicrobials: Anti-infectives (From admission, onward)    Start     Dose/Rate Route Frequency Ordered Stop   08/22/21 1500  vancomycin (VANCOCIN) IVPB 1000 mg/200 mL premix        1,000 mg 200 mL/hr over 60 Minutes Intravenous To Radiology 08/21/21 1328 08/23/21 1735       PRN meds: acetaminophen, benzonatate, bisacodyl, cyclobenzaprine, diphenhydrAMINE **OR** diphenhydrAMINE, fentaNYL, fentaNYL, guaiFENesin, hydrALAZINE, ipratropium-albuterol, labetalol, LORazepam, midazolam, midazolam, naloxone **AND** sodium chloride flush, ondansetron **OR** ondansetron (ZOFRAN) IV, promethazine (PHENERGAN) injection (IM or IVPB), promethazine-codeine, traZODone   Objective: Vitals:   08/24/21 0851 08/24/21 1310  BP:    Pulse:    Resp: 17 20  Temp:    SpO2:      Intake/Output Summary (Last 24 hours) at 08/24/2021 1337 Last data filed at 08/24/2021 0914 Gross per 24 hour  Intake 1028.3 ml  Output 1125 ml  Net -96.7 ml    Filed Weights   08/15/21 0406 08/15/21 1222 08/22/21 1606   Weight: 78.6 kg 78.6 kg 82.1 kg   Weight change:  Body mass index is 30.12 kg/m.   Physical Exam: General exam: Pleasant, middle-aged Caucasian female.  Sitting up in chair.  Currently on PCA pump.  On low-flow oxygen  skin: No rashes, lesions or ulcers. HEENT: Atraumatic, normocephalic, no obvious bleeding Lungs: Clear to auscultation bilaterally CVS: Regular rate and rhythm, no murmur GI/Abd soft, nontender, nondistended, bowel sound present CNS: Alert, awake, oriented x3 Psychiatry: Depressed look Extremities: Trace bilateral pedal edema, no calf tenderness  Data Review: I have personally reviewed the laboratory data and studies available.  Recent Labs  Lab 08/18/21 0337 08/18/21 0940 08/19/21 0346 08/20/21 0346 08/22/21 0358  WBC 4.6 6.1 5.9 4.8 5.2  NEUTROABS  --  5.2  --   --  4.7  HGB 12.2 13.2 12.5 12.8 11.8*  HCT 36.2 39.5 37.7 39.3 35.7*  MCV 91.4 91.0 92.6 94.5 92.5  PLT 141* 154 158 124* 147*    Recent Labs  Lab 08/18/21 0337 08/18/21 0940 08/19/21 0346 08/20/21 0346 08/21/21 0330 08/22/21 0358 08/23/21 1005  08/23/21 1253 08/24/21 1229  NA 136   < > 138 141 141 140  --  138  --   K 3.1*   < > 3.7 3.6 3.6 3.2*  --  3.8  --   CL 104   < > 104 107 105 102  --  99  --   CO2 26   < > 26 26 29 30   --  26  --   GLUCOSE 103*   < > 126* 111* 161* 127*  --  91  --   BUN <5*   < > <5* <5* 5* 7*  --  9  --   CREATININE 0.79   < > 0.69 0.57 0.70 0.72  --  0.76  --   CALCIUM 8.4*   < > 8.7* 8.6* 9.0 8.8*  --  9.0  --   MG 1.7  --  1.9 1.8  --   --  1.7  --  2.1  PHOS  --   --   --   --   --  3.4  --   --  3.1   < > = values in this interval not displayed.     F/u labs ordered Unresulted Labs (From admission, onward)     Start     Ordered   08/24/21 1202  Magnesium  (ICU Tube Feeding: PEPuP )  5A & 5P,   R (with TIMED occurrences)     Question:  Specimen collection method  Answer:  Lab=Lab collect   08/24/21 1201   08/24/21 1202  Phosphorus  (ICU Tube  Feeding: PEPuP )  5A & 5P,   R (with TIMED occurrences)     Question:  Specimen collection method  Answer:  Lab=Lab collect   08/24/21 1201            Signed, Terrilee Croak, MD [facility] 08/24/2021

## 2021-08-24 NOTE — Progress Notes (Signed)
Initial Nutrition Assessment  DOCUMENTATION CODES:   Severe malnutrition in context of acute illness/injury  INTERVENTION:  - will order Osmolite 1.5 @ 20 ml/hr to advance by 10 ml every 24 hours to reach goal rate of 60 ml/hr with 45 ml Prosource TF TID and 100 ml free water every 4 hours. - at goal rate, this regimen will provide 2280 kcal, 123 grams protein, and 1697 ml free water.  Monitor magnesium, potassium, and phosphorus daily for at least 3 days, MD to replete as needed, as pt is at risk for refeeding syndrome given severe malnutrition.    NUTRITION DIAGNOSIS:   Severe Malnutrition related to acute illness, dysphagia as evidenced by percent weight loss, energy intake < or equal to 50% for > or equal to 5 days. -ongoing  GOAL:   Patient will meet greater than or equal to 90% of their needs -unable to meet at this time  MONITOR:   Diet advancement, TF tolerance, Labs, Weight trends, I & O's  REASON FOR ASSESSMENT:   Consult Enteral/tube feeding initiation and management  ASSESSMENT:   60 y.o. female with medical history of stage 4 metastatic lung cancer on chemo, PE on Eliquis, dysphagia, and recent hospitalization from 8/27-8/29 for acute PE and dysphagia. She presented to the ED due to progressive dysphagia. GI consulted.  She remains NPO since 9/20. PEG was able to be placed by IR yesterday ~1700.   Patient laying in bed with no family or visitors present. She was very drowsy during visit. She reports some soreness to PEG site but it is improved from yesterday. No overt/sharp pain or nausea. She has been able to sip on water and take in ice chips this AM without issue.   Discussed plan with her about plan to initiate low-rate/trickle rate TF this evening and to advance by 10 ml every 24 hours, at this time, to goal rate of 60 ml/hr. Also discussed plan to transition to bolus feeds prior to d/c.   Answered all questions about what to expect with TF initiation and  advancement.   She has only been weighed on 9/13 and 9/20 this admission. Non-pitting edema to RUE and BLE documented in the edema section of flow sheet.     Labs reviewed; alk phos elevated.  Medications reviewed; 2 g IV Mg sulfate x1 run 9/21, 40 mg IV protonix BID, 10 mEq IV KCl x2 runs 9/21, 1 tablet senokot/day, 100 mg IV thiamine/day started 9/20. IVF; D5-LR @ 50 ml/hr (204 kcal/24 hrs).    Diet Order:   Diet Order             Diet NPO time specified  Diet effective midnight                   EDUCATION NEEDS:   Not appropriate for education at this time  Skin:  Skin Assessment: Reviewed RN Assessment  Last BM:  PTA/unknown  Height:   Ht Readings from Last 1 Encounters:  08/15/21 5' 5"  (1.651 m)    Weight:   Wt Readings from Last 1 Encounters:  08/22/21 82.1 kg     Estimated Nutritional Needs:  Kcal:  2200-2440 kcal Protein:  110-130 grams Fluid:  >/= 2.3 L/day     Jarome Matin, MS, RD, LDN, CNSC Inpatient Clinical Dietitian RD pager # available in [place]  After hours/weekend pager # available in [facility]

## 2021-08-25 ENCOUNTER — Ambulatory Visit
Admission: RE | Admit: 2021-08-25 | Discharge: 2021-08-25 | Disposition: A | Discharge status: Home / Self Care | Payer: 59 | Source: Ambulatory Visit | Attending: Radiation Oncology | Admitting: Radiation Oncology

## 2021-08-25 DIAGNOSIS — R1312 Dysphagia, oropharyngeal phase: Secondary | ICD-10-CM | POA: Diagnosis not present

## 2021-08-25 DIAGNOSIS — C3491 Malignant neoplasm of unspecified part of right bronchus or lung: Secondary | ICD-10-CM | POA: Diagnosis not present

## 2021-08-25 LAB — PHOSPHORUS
Phosphorus: 3.2 mg/dL (ref 2.5–4.6)
Phosphorus: 3.4 mg/dL (ref 2.5–4.6)

## 2021-08-25 LAB — GLUCOSE, CAPILLARY
Glucose-Capillary: 103 mg/dL — ABNORMAL HIGH (ref 70–99)
Glucose-Capillary: 121 mg/dL — ABNORMAL HIGH (ref 70–99)
Glucose-Capillary: 128 mg/dL — ABNORMAL HIGH (ref 70–99)
Glucose-Capillary: 130 mg/dL — ABNORMAL HIGH (ref 70–99)
Glucose-Capillary: 132 mg/dL — ABNORMAL HIGH (ref 70–99)
Glucose-Capillary: 92 mg/dL (ref 70–99)

## 2021-08-25 LAB — MAGNESIUM
Magnesium: 2.2 mg/dL (ref 1.7–2.4)
Magnesium: 2 mg/dL (ref 1.7–2.4)

## 2021-08-25 MED ORDER — FENTANYL 75 MCG/HR TD PT72
1.0000 | MEDICATED_PATCH | TRANSDERMAL | Status: DC
  Filled 2021-08-25: qty 1

## 2021-08-25 MED ORDER — APIXABAN 5 MG PO TABS
5.0000 mg | ORAL_TABLET | Freq: Two times a day (BID) | ORAL | Status: DC
  Administered 2021-08-25 – 2021-08-28 (×7): 5 mg via ORAL
  Filled 2021-08-25 (×7): qty 1

## 2021-08-25 MED ORDER — FENTANYL 50 MCG/HR TD PT72
3.0000 | MEDICATED_PATCH | TRANSDERMAL | Status: DC
  Administered 2021-08-25 – 2021-08-28 (×2): 3 via TRANSDERMAL
  Filled 2021-08-25 (×2): qty 3

## 2021-08-25 MED ORDER — FENTANYL 75 MCG/HR TD PT72
1.0000 | MEDICATED_PATCH | TRANSDERMAL | Status: DC
Start: 2021-08-25 — End: 2021-08-25
  Filled 2021-08-25: qty 1

## 2021-08-25 MED ORDER — FUROSEMIDE 10 MG/ML IJ SOLN
20.0000 mg | Freq: Every day | INTRAMUSCULAR | Status: DC
  Administered 2021-08-25 – 2021-08-26 (×2): 20 mg via INTRAVENOUS
  Filled 2021-08-25 (×2): qty 2

## 2021-08-25 NOTE — Progress Notes (Signed)
Subjective: The patient is seen and examined today.  Her daughter and and son were at the bedside.  The patient is feeling a little bit better today.  She continues to have pain and she is currently on Dilaudid drip.  She had a PEG tube placed yesterday and started nutrition through the PEG tube.  She is able to swallow few sips of coffee in the last few days after starting radiation.  She continues to have shortness of breath at baseline increased with exertion.  She has no fever or chills.  Objective: Vital signs in last 24 hours: Temp:  [97.6 F (36.4 C)-97.7 F (36.5 C)] 97.6 F (36.4 C) (09/23 1257) Pulse Rate:  [75-99] 75 (09/23 1257) Resp:  [15-26] 20 (09/23 1958) BP: (122-167)/(85-112) 130/87 (09/23 1257) SpO2:  [95 %-100 %] 95 % (09/23 1958) Weight:  [181 lb 3.5 oz (82.2 kg)] 181 lb 3.5 oz (82.2 kg) (09/23 0359)  Intake/Output from previous day: 09/22 0701 - 09/23 0700 In: 1796.7 [I.V.:1329.7; NG/GT:357; IV Piggyback:50] Out: 610 [Urine:600; Drains:10] Intake/Output this shift: No intake/output data recorded.  General appearance: alert, cooperative, fatigued, and no distress Resp: diminished breath sounds RLL, dullness to percussion RLL, and rales RLL Cardio: regular rate and rhythm, S1, S2 normal, no murmur, click, rub or gallop GI: soft, non-tender; bowel sounds normal; no masses,  no organomegaly Extremities: extremities normal, atraumatic, no cyanosis or edema  Lab Results:  No results for input(s): WBC, HGB, HCT, PLT in the last 72 hours. BMET Recent Labs    08/23/21 1253  NA 138  K 3.8  CL 99  CO2 26  GLUCOSE 91  BUN 9  CREATININE 0.76  CALCIUM 9.0    Studies/Results: No results found.  Medications: I have reviewed the patient's current medications.  CODE STATUS: Full code  Assessment/Plan:    LOS: 10 days  This is a very pleasant 60 years old white female diagnosed with stage IV (T3, N2, M1 C) non-small cell lung cancer, adenocarcinoma with  positive EGFR mutation with deletion in exon 19 in May 2021.  The patient was also found to have progressive disease to the brain in June 2022 status post SRS to metastatic brain lesions completed 05/24/2021. She has been on treatment with Tagrisso 80 mg p.o. daily since Apr 21, 2020 and has been tolerating her treatment fairly well. Over the last few weeks the patient has progressive shortness of breath as well as dysphagia and back pain. Her shortness of breath was initially thought to be related to [place] induced pneumonitis and the patient was treated with a tapered dose of prednisone with no improvement. Unfortunately repeat CT scan of the chest performed yesterday and in comparison to her previous scan in June 2022 shown above it showed significant consolidation and likely tumor progression in the posterior right lower lobe explaining the current dysphagia, shortness of breath as well as the back pain. The patient is currently undergoing palliative radiotherapy to the consolidative mass and the right lower lobe and tolerating her treatment well.  She has mild improvement in the dysphagia. She had Guardant 360 blood test for molecular studies performed last week and the results became available today.  It showed persistent of the EGFR exon 19 deletion but there was development of new EGFR mutation that is resistant to [place] but may be responsive to treatment with first-generation EGFR tyrosine kinase inhibitor like erlotinib plus Cyramza.  I had a lengthy discussion with the patient and her family today about her  current condition and the treatment options.  I still like the patient to have a bronchoscopy for confirmation of the disease progression and also to have more tissue for confirmation of the molecular abnormalities and also to rule out with the escape mechanism of developing small cell lung cancer. The patient would like some time to think about this option and also needs some time to  recover from the other recent procedures. I will arrange for the patient a follow-up appointment with me after discharge for more detailed discussion of her treatment options based on the new findings. For the dysphagia, she will continue her current nutrition the the PEG tube. For pain management, it may be advisable to have palliative care team on board to switch her from the IV medication to oral medication before discharge. Thank you for taking good care of Ms. Barfoot, I will continue to follow observation with you and assist in her management.  If you need any help during the weekend please contact the oncologist on call.  Eilleen Kempf 08/25/2021

## 2021-08-25 NOTE — Progress Notes (Signed)
PROGRESS NOTE  Brittany Mccarty  DOB: Aug 05, 1961  PCP: Kathyrn Lass POI:[id number]  DOA: 08/14/2021  LOS: 10 days  Hospital Day: 12   Chief Complaint  Patient presents with   Dysphagia    Brief narrative: Brittany Mccarty is a 60 y.o. female with PMH significant for metastatic lung cancer, pulmonary embolism. Patient presented to the ED on 9/12 with complaint of progressive dysphagia to a point where it is difficult even to swallow the medications..  Also complained of wheezing, productive cough. Recent hospitalization 8/27-8/29 for acute pulm embolism and dysphagia.  On admission her heart rate was elevated to 116. Chest x-ray showed right lower lobe opacity, positive volume loss and mediastinal retraction to the right side. Admitted to hospitalist service for progressive dysphagia and respiratory failure GI and pulmonary consultations were obtained. See below for details.  Subjective: Patient was seen and examined this morning.   Sitting up in chair.  Not in distress.  No new symptoms.  Pain controlled.  Not requiring oxygen at rest.  Daughter and son at bedside. Currently on Dilaudid PCA pump.  PEG tube was placed on 9/21.  Tube feeding started yesterday 9/22. Blood pressure in 150s last night.  Assessment/Plan: Esophageal stenosis -Patient presented with progressive dysphagia for several days  -Underwent EGD on 9/13, noted to have external compression from progressive lung cancer.  Unable to pass scope.   -She had repeat EGD and esophageal dilatation done on 9/15.  Esophageal biopsy showed fibrotic stroma and no intrinsic malignancy. -PEG tube placed on 9/21.  Tube feeding started on 9/22.  Nutrition consult appreciated..  Stop IV fluid.  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  -CT chest 09/15 with tumor progression in the posterior right lower lobe  -Patient  also is getting radiation treatment.  Noted a plan to continue radiation till next week. -Pulmonology offered bronchoscopy for sampling and molecular testing.  Patient prefers to do it outpatient.. -Currently not on supplemental oxygen.  Monitor oxygen level on ambulation.  Hx of pulmonary embolism -Currently Eliquis is on hold because of high risk of hemoptysis with persistent cough.  Cough improved.  I will reinitiate Eliquis at this time.  Elevated blood pressure -On Coreg 12.5 mg twice daily and IV Lasix.  Acute respiratory failure with hypoxia -Currently on room air at rest.  Monitor oxygen requirement on ambulation.  Expect improvement with IV Lasix.  Continue to monitor.  Wean down as tolerated.  Severe calorie protein malnutrition -Nutrition consult appreciated.  Currently has tube feeding running through PEG tube.  Mobility: Encourage ambulation Code Status:   Code Status: Full Code  Nutritional status: Body mass index is 30.16 kg/m. Nutrition Problem: Severe Malnutrition Etiology: acute illness, dysphagia Signs/Symptoms: percent weight loss, energy intake < or equal to 50% for > or equal to 5 days Percent weight loss: 7 % Diet:  Diet Order             Diet clear liquid Room service appropriate? Yes; Fluid consistency: Thin  Diet effective now                  DVT prophylaxis:  Place and maintain sequential compression device Start: 08/19/21 0918 apixaban (ELIQUIS) tablet 5 mg   Antimicrobials: None Fluid: Stop fluid today. Consultants: Pulmonology, oncology, radiation oncology Family Communication: Daughter and son at bedside  Status is: Inpatient  Remains inpatient appropriate because: Palliative radiation till next week. Dispo: The  patient is from: Home              Anticipated d/c is to: Home next week after radiation is complete.              Patient currently is not medically stable to d/c.   Difficult to place patient No     Infusions:    feeding supplement (OSMOLITE 1.5 CAL) 30 mL/hr at 08/25/21 0700   lactated ringers 0 mL (08/15/21 1915)   promethazine (PHENERGAN) injection (IM or IVPB) Stopped (08/24/21 1430)    Scheduled Meds:  apixaban  5 mg Oral BID   carvedilol  12.5 mg Per Tube BID WC   dexamethasone (DECADRON) injection  4 mg Intravenous Q24H   feeding supplement (PROSource TF)  45 mL Per Tube TID   fentaNYL  1 patch Transdermal Q72H   free water  100 mL Per Tube Q4H   furosemide  20 mg Intravenous Daily   HYDROmorphone   Intravenous Q4H   methylnaltrexone  12 mg Subcutaneous Q48H   pantoprazole (PROTONIX) IV  40 mg Intravenous Q12H   senna-docusate  1 tablet Oral QHS   thiamine injection  100 mg Intravenous Daily   traZODone  50 mg Per Tube QHS    Antimicrobials: Anti-infectives (From admission, onward)    Start     Dose/Rate Route Frequency Ordered Stop   08/22/21 1500  vancomycin (VANCOCIN) IVPB 1000 mg/200 mL premix        1,000 mg 200 mL/hr over 60 Minutes Intravenous To Radiology 08/21/21 1328 08/23/21 1735       PRN meds: acetaminophen, benzonatate, bisacodyl, cyclobenzaprine, diphenhydrAMINE **OR** diphenhydrAMINE, fentaNYL, fentaNYL, guaiFENesin, hydrALAZINE, ipratropium-albuterol, labetalol, LORazepam, magic mouthwash w/lidocaine, naloxone **AND** sodium chloride flush, ondansetron **OR** ondansetron (ZOFRAN) IV, promethazine (PHENERGAN) injection (IM or IVPB), promethazine-codeine   Objective: Vitals:   08/25/21 1223 08/25/21 1257  BP: 122/85 130/87  Pulse: 84 75  Resp: (!) 22 18  Temp:  97.6 F (36.4 C)  SpO2: 98% 98%    Intake/Output Summary (Last 24 hours) at 08/25/2021 1329 Last data filed at 08/25/2021 1100 Gross per 24 hour  Intake 2312.94 ml  Output 610 ml  Net 1702.94 ml   Filed Weights   08/15/21 1222 08/22/21 1606 08/25/21 0359  Weight: 78.6 kg 82.1 kg 82.2 kg   Weight change:  Body mass index is 30.16 kg/m.   Physical Exam: General exam: Pleasant, middle-aged  Caucasian female.  Sitting up in chair.  Currently on PCA pump.  Not requiring supplemental oxygen at rest skin: No rashes, lesions or ulcers. HEENT: Atraumatic, normocephalic, no obvious bleeding Lungs: Clear to auscultation bilaterally CVS: Regular rate and rhythm, no murmur GI/Abd soft, nontender, nondistended, bowel sound present CNS: Alert, awake, oriented x3 Psychiatry: Depressed look Extremities: Improving bilateral pedal edema, no calf tenderness  Data Review: I have personally reviewed the laboratory data and studies available.  Recent Labs  Lab 08/19/21 0346 08/20/21 0346 08/22/21 0358  WBC 5.9 4.8 5.2  NEUTROABS  --   --  4.7  HGB 12.5 12.8 11.8*  HCT 37.7 39.3 35.7*  MCV 92.6 94.5 92.5  PLT 158 124* 147*   Recent Labs  Lab 08/19/21 0346 08/20/21 0346 08/21/21 0330 08/22/21 0358 08/23/21 1005 08/23/21 1253 08/24/21 1229 08/24/21 1705 08/25/21 0350  NA 138 141 141 140  --  138  --   --   --   K 3.7 3.6 3.6 3.2*  --  3.8  --   --   --  CL 104 107 105 102  --  99  --   --   --   CO2 26 26 29 30   --  26  --   --   --   GLUCOSE 126* 111* 161* 127*  --  91  --   --   --   BUN <5* <5* 5* 7*  --  9  --   --   --   CREATININE 0.69 0.57 0.70 0.72  --  0.76  --   --   --   CALCIUM 8.7* 8.6* 9.0 8.8*  --  9.0  --   --   --   MG 1.9 1.8  --   --  1.7  --  2.1 2.1 2.2  PHOS  --   --   --  3.4  --   --  3.1 3.1 3.2    F/u labs ordered Unresulted Labs (From admission, onward)     Start     Ordered   08/24/21 1202  Magnesium  (ICU Tube Feeding: PEPuP )  5A & 5P,   R     Question:  Specimen collection method  Answer:  Lab=Lab collect   08/24/21 1201   08/24/21 1202  Phosphorus  (ICU Tube Feeding: PEPuP )  5A & 5P,   R     Question:  Specimen collection method  Answer:  Lab=Lab collect   08/24/21 1201            Signed, Terrilee Croak, MD [facility] 08/25/2021

## 2021-08-25 NOTE — Progress Notes (Signed)
NUTRITION NOTE  Last full follow-up note written on 9/21. Patient had radiation this AM. Able to visit with patient and her daughter after patient returned to her room.  She is currently receiving Osmolite 1.5 @ 30 ml/hr with 45 ml Prosource TF TID and 100 ml free water every 4 hours.   Osmolite 1.5 is being advanced by 10 ml every 24 hours to reach goal rate of 60 ml/hr.   Patient states that radiation was less uncomfortable today than it has any previous day. She denies discomfort around PEG site and reports that nursing staff is staying ahead of nausea with medication which has been helpful.   Explained rationale for slow advancement to daughter and discussed plan for transition from continuous TF to bolus TF once d/c plan is established.   Daughter asks about juicing and providing juice via PEG in addition to TF formula. Able to talk through this and recommendations related to this.   No other questions or concerns at this time. Will continue to follow per protocol.   Labs reviewed; CBGs: 128, 132, 103 mg/dl, Phos and Mg WDL. Full BMP note done today.      Jarome Matin, MS, RD, LDN, CNSC Inpatient Clinical Dietitian RD pager # available in [place]  After hours/weekend pager # available in [facility]

## 2021-08-26 DIAGNOSIS — C3491 Malignant neoplasm of unspecified part of right bronchus or lung: Secondary | ICD-10-CM | POA: Diagnosis not present

## 2021-08-26 DIAGNOSIS — G893 Neoplasm related pain (acute) (chronic): Secondary | ICD-10-CM

## 2021-08-26 DIAGNOSIS — R1319 Other dysphagia: Secondary | ICD-10-CM | POA: Diagnosis not present

## 2021-08-26 DIAGNOSIS — Z515 Encounter for palliative care: Secondary | ICD-10-CM | POA: Diagnosis not present

## 2021-08-26 LAB — GLUCOSE, CAPILLARY
Glucose-Capillary: 109 mg/dL — ABNORMAL HIGH (ref 70–99)
Glucose-Capillary: 110 mg/dL — ABNORMAL HIGH (ref 70–99)
Glucose-Capillary: 127 mg/dL — ABNORMAL HIGH (ref 70–99)
Glucose-Capillary: 128 mg/dL — ABNORMAL HIGH (ref 70–99)
Glucose-Capillary: 138 mg/dL — ABNORMAL HIGH (ref 70–99)
Glucose-Capillary: 92 mg/dL (ref 70–99)

## 2021-08-26 NOTE — Progress Notes (Signed)
Daily Progress Note   Patient Name: Brittany Mccarty       Date: 08/26/2021 DOB: 12-09-1960  Age: 60 y.o. MRN#: [id number] Attending Physician: Terrilee Croak, MD Primary Care Physician: Pcp, No Admit Date: 08/14/2021  Reason for Consultation/Follow-up: Pain control  Subjective: I saw and examined Brittany Mccarty today.  She was lying in bed in no distress at time of my encounter.  Her husband, Ed, was also present at the bedside.  She reports that she feels her pain has been improving since increasing fentanyl patch.  She is still using PCA regularly and we discussed eventual goal to transition off of PCA to enteral rescue medication (administered via PEG).  She, Ed, and I had a long discussion regarding usage of pain medication for cancer-related pain.  We discussed difference between long and short acting medications and utilizing these to control pain around the clock to improve functional status and minimize side effects as much as possible.  We discussed the difference in administration between enteral, transdermal, and IV medications.  We also discussed side effects of medications including constipation.  I queried her pump which revealed:  24 hours 16.5 mg of Dilaudid 12 hours 8 mg of Dilaudid 8 hours 4 mg of Dilaudid 4 hours 2.5 mg of Dilaudid 2 hours 1 mg of Dilaudid  We discussed that her fentanyl patch was increased last night around I would anticipate her overall needs to continue to drop.  It appears they have been of the last several hours.  We therefore discussed plan to continue with current regimen and reassess tomorrow for beginning to transition to oral rescue medication.  Length of Stay: 11  Current Medications: Scheduled Meds:  . apixaban  5 mg Oral BID  .  carvedilol  12.5 mg Per Tube BID WC  . dexamethasone (DECADRON) injection  4 mg Intravenous Q24H  . feeding supplement (PROSource TF)  45 mL Per Tube TID  . fentaNYL  3 patch Transdermal Q72H  . free water  100 mL Per Tube Q4H  . furosemide  20 mg Intravenous Daily  . HYDROmorphone   Intravenous Q4H  . methylnaltrexone  12 mg Subcutaneous Q48H  . pantoprazole (PROTONIX) IV  40 mg Intravenous Q12H  . senna-docusate  1 tablet Oral QHS  . thiamine injection  100 mg Intravenous Daily  .  traZODone  50 mg Per Tube QHS    Continuous Infusions: . feeding supplement (OSMOLITE 1.5 CAL) 40 mL/hr at 08/26/21 0000  . lactated ringers 0 mL (08/15/21 1915)  . promethazine (PHENERGAN) injection (IM or IVPB) Stopped (08/24/21 1430)    PRN Meds: acetaminophen, benzonatate, bisacodyl, cyclobenzaprine, diphenhydrAMINE **OR** diphenhydrAMINE, guaiFENesin, hydrALAZINE, ipratropium-albuterol, labetalol, LORazepam, magic mouthwash w/lidocaine, naloxone **AND** sodium chloride flush, ondansetron **OR** ondansetron (ZOFRAN) IV, promethazine (PHENERGAN) injection (IM or IVPB), promethazine-codeine  Physical Exam        General: Alert, awake, in no acute distress.   HEENT: No bruits, no goiter, no JVD Heart: Regular rate and rhythm. No murmur appreciated. Lungs: Good air movement, clear Abdomen: Soft, nontender, nondistended, positive bowel sounds.   Ext: No significant edema Skin: Warm and dry Neuro: Grossly intact, nonfocal.    Vital Signs: BP (!) 136/97 (BP Location: Right Arm)   Pulse 81   Temp 97.8 F (36.6 C) (Oral)   Resp (!) 21   Ht 5\' 5"  (1.651 m)   Wt 83.7 kg   SpO2 97%   BMI 30.71 kg/m  SpO2: SpO2: 97 % O2 Device: O2 Device: Room Air O2 Flow Rate: O2 Flow Rate (L/min): 0 L/min  Intake/output summary:  Intake/Output Summary (Last 24 hours) at 08/26/2021 1719 Last data filed at 08/26/2021 1411 Gross per 24 hour  Intake 580 ml  Output 650 ml  Net -70 ml   LBM: Last BM Date:  08/20/21 Baseline Weight: Weight: 78.6 kg Most recent weight: Weight: 83.7 kg       Palliative Assessment/Data:      Patient Active Problem List   Diagnosis Date Noted  . Palliative care by specialist   . Protein-calorie malnutrition, severe 08/16/2021  . Chest pain 07/30/2021  . Pneumonitis 07/30/2021  . Dysphagia 07/30/2021  . Bilateral headaches 07/30/2021  . Visual changes 07/30/2021  . Hyponatremia 07/30/2021  . Overweight 07/30/2021  . Cough 07/24/2021  . Hyperkalemia 10/13/2020  . Brain metastases ([place]) 05/13/2020  . Encounter for antineoplastic chemotherapy 05/03/2020  . Goals of care, counseling/discussion 05/03/2020  . Complication of chest tube 04/24/2020  . Adenocarcinoma of lung, right ([place]) 04/24/2020  . Pulmonary embolism ([place]) 04/24/2020  . Pleuritic chest pain 04/24/2020  . Metastatic cancer ([place]) 04/24/2020    Palliative Care Assessment & Plan   Patient Profile: 60 year old female with metastatic lung cancer, including brain mets, and locally advanced disease that is caused compression of external esophagus requiring PEG placement.  She is currently receiving radiation and genotype testing is pending.  Palliative seeing today for pain management.  Recommendations/Plan: Pain, cancer related: Currently on PCA.  Her needs appear that they may be dropping over the last couple of hours since increasing of her fentanyl patch to 150 mcg/h last evening.  Discussed continuing PCA for today and reassessing for potential transition off of PCA to enteral rescue medication (via tube) tomorrow.  She is agreeable to this. Constipation, opioid related: Continue to monitor.  Reports she "feels something is coming."    Goals of Care and Additional Recommendations: Limitations on Scope of Treatment: Full Scope Treatment  Code Status:    Code Status Orders  (From admission, onward)           Start     Ordered   08/14/21 1712  Full code  Continuous         08/14/21 1712           Code Status History     Date Active  Date Inactive Code Status Order ID Comments User Context   07/30/2021 0823 07/31/2021 2146 Full Code [id number]  Charlann Lange, MD ED   07/30/2021 [phone] 07/30/2021 0823 Full Code [id number]  Charlann Lange, MD ED   04/24/2020 0311 04/26/2020 1711 Full Code [id number]  Arlan Organ, DO ED      Advance Directive Documentation    Flowsheet Row Most Recent Value  Type of Advance Directive Healthcare Power of Attorney  Pre-existing out of facility DNR order (yellow form or pink MOST form) --  "MOST" Form in Place? --       Prognosis:  Unable to determine  Discharge Planning: To Be Determined  Care plan was discussed with patient, husband  Thank you for allowing the Palliative Medicine Team to assist in the care of this patient.   Total Time 50 Prolonged Time Billed No      Greater than 50%  of this time was spent counseling and coordinating care related to the above assessment and plan.  Micheline Rough, MD  Please contact Palliative Medicine Team phone at [phone] for questions and concerns.

## 2021-08-26 NOTE — Progress Notes (Signed)
Palliative Care Progress Note  Brittany Mccarty continues to be on Hydromorphone PCA for pain control. Her basal rate was discontinued yesterday but she continues to be using around 10-12mg  of bolus med in a 24 hour period.Her mental status seems to be improving-yesterday family expressed concerns about some confusion and hallucinations. Today she is clear and mental status is good. There is no myoclonus and she is not overly sedated. She is however increasingly dyspneic with talking and exertion. She reports being able to walk to bathroom but needs assistance. Prior to admission she was completely independent.  Metastatic Lung Cancer, brain mets tx w/radiation, no know bone involvement but locally advanced disease especially within the mediastinum. This has caused esophageal external compression requiring placement of a PEG for supplemental nutrition. Currently receiving radiation to this area. Oral chemo has been discontinued. Genotype testing is pending-treatment per Dr. Earlie Server.  Cancer related pain. Anticipatory anxiety re: pain control. Fentanyl increased to 123mcg q72hours Hydromorphone PCA, no basal now. Hopefully bolus use will decrease. Working towards getting her off PCA and on oxycodone for breakthrough pain. Decadron 4mg  IV daily, will begin weaning her off this as tolerated. Can consider use of additional adjuvant medications to control her pain. Nausea-multifactorial, worse following radiation. Continue to pre treat w/ steroid and phenergan. Patient has been hesitant to use benzodiazepines. I briefly re-addressed her goals of care and encouraged her to think about our conversation about ACP and code status form a few days ago. She is considering her options but not up for making any decisions today.   Brittany Hacker, DO Palliative Medicine  35 min  Greater than 50%  of this time was spent counseling and coordinating care related to the above assessment and plan.

## 2021-08-26 NOTE — Progress Notes (Signed)
PROGRESS NOTE  Brittany Mccarty  DOB: 1961/11/12  PCP: Kathyrn Lass XBD:[id number]  DOA: 08/14/2021  LOS: 11 days  Hospital Day: 44   Chief Complaint  Patient presents with   Dysphagia    Brief narrative: Brittany Mccarty is a 60 y.o. female with PMH significant for metastatic lung cancer, pulmonary embolism. Patient presented to the ED on 9/12 with complaint of progressive dysphagia to a point where it is difficult even to swallow the medications..  Also complained of wheezing, productive cough. Recent hospitalization 8/27-8/29 for acute pulm embolism and dysphagia.  On admission her heart rate was elevated to 116. Chest x-ray showed right lower lobe opacity, positive volume loss and mediastinal retraction to the right side. Admitted to hospitalist service for progressive dysphagia and respiratory failure GI and pulmonary consultations were obtained. See below for details.  Subjective: Patient was seen and examined this morning.   Sitting up in chair.  Not in distress.  Husband at bedside.  Feels better today.  She has been able to tolerate liquid diet and would like to advance to soft diet. Heart rate and blood pressure improving.  Assessment/Plan: Esophageal stenosis -Patient presented with progressive dysphagia for several days  -Underwent EGD on 9/13, noted to have external compression from progressive lung cancer.  Unable to pass scope.   -She had repeat EGD and esophageal dilatation done on 9/15.  Esophageal biopsy showed fibrotic stroma and no intrinsic malignancy. -PEG tube placed on 9/21.  Tube feeding started on 9/22.  Nutrition consult appreciated. -With relief of esophageal obstruction, patient has been able to tolerate liquid diet.  She wants to advance to soft diet.  We will order for lunch.  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  -CT chest 09/15 with  tumor progression in the posterior right lower lobe  -Patient also is getting radiation treatment.  Noted a plan to continue radiation till Thursday next week.  Per radiation oncology, it can be done as an outpatient as well if patient is otherwise stable for discharge. -Pulmonology offered bronchoscopy for sampling and molecular testing.  Patient prefers to do it outpatient.. -Currently not on supplemental oxygen.  Monitor oxygen level on ambulation.  Cancer related pain -Highly appreciate palliative care involvement in her pain control. -Currently on fentanyl patch 150 mcg every 72 hours. -On Dilaudid PCA.  Plan is to get her off the PCA and oxycodone for breakthrough pain. -Also on Decadron 4 mg IV daily, plan is to gradually taper it off.  Hx of pulmonary embolism -On Eliquis.  Tachycardia/hypertension -On Coreg 12.5 mg twice daily and Lasix 20 mg IV daily. -Heart rate and blood pressure improving last 24 hours.  Acute respiratory failure with hypoxia -Currently on room air at rest.  Monitor oxygen requirement on ambulation.  Expect improvement with IV Lasix.  Continue to monitor.  Wean down as tolerated.  Severe calorie protein malnutrition -Nutrition consult appreciated.  Currently has tube feeding running through PEG tube.  Mobility: Encourage ambulation Code Status:   Code Status: Full Code  Nutritional status: Body mass index is 30.71 kg/m. Nutrition Problem: Severe Malnutrition Etiology: acute illness, dysphagia Signs/Symptoms: percent weight loss, energy intake < or equal to 50% for > or equal to 5 days Percent weight loss: 7 % Diet:  Diet Order             DIET SOFT Room service appropriate? Yes; Fluid consistency: Thin  Diet effective  now                  DVT prophylaxis:  Place and maintain sequential compression device Start: 08/19/21 0918 apixaban (ELIQUIS) tablet 5 mg   Antimicrobials: None Fluid: Not on IV fluid Consultants: Pulmonology, oncology,  radiation oncology Family Communication: Husband at bedside this morning.  Status is: Inpatient  Remains inpatient appropriate because: Palliative radiation till Thursday next week. Dispo: The patient is from: Home              Anticipated d/c is to: Home in next few days              Patient currently is not medically stable to d/c.   Difficult to place patient No     Infusions:   feeding supplement (OSMOLITE 1.5 CAL) 40 mL/hr at 08/26/21 0000   lactated ringers 0 mL (08/15/21 1915)   promethazine (PHENERGAN) injection (IM or IVPB) Stopped (08/24/21 1430)    Scheduled Meds:  apixaban  5 mg Oral BID   carvedilol  12.5 mg Per Tube BID WC   dexamethasone (DECADRON) injection  4 mg Intravenous Q24H   feeding supplement (PROSource TF)  45 mL Per Tube TID   fentaNYL  3 patch Transdermal Q72H   free water  100 mL Per Tube Q4H   furosemide  20 mg Intravenous Daily   HYDROmorphone   Intravenous Q4H   methylnaltrexone  12 mg Subcutaneous Q48H   pantoprazole (PROTONIX) IV  40 mg Intravenous Q12H   senna-docusate  1 tablet Oral QHS   thiamine injection  100 mg Intravenous Daily   traZODone  50 mg Per Tube QHS    Antimicrobials: Anti-infectives (From admission, onward)    Start     Dose/Rate Route Frequency Ordered Stop   08/22/21 1500  vancomycin (VANCOCIN) IVPB 1000 mg/200 mL premix        1,000 mg 200 mL/hr over 60 Minutes Intravenous To Radiology 08/21/21 1328 08/23/21 1735       PRN meds: acetaminophen, benzonatate, bisacodyl, cyclobenzaprine, diphenhydrAMINE **OR** diphenhydrAMINE, guaiFENesin, hydrALAZINE, ipratropium-albuterol, labetalol, LORazepam, magic mouthwash w/lidocaine, naloxone **AND** sodium chloride flush, ondansetron **OR** ondansetron (ZOFRAN) IV, promethazine (PHENERGAN) injection (IM or IVPB), promethazine-codeine   Objective: Vitals:   08/26/21 0403 08/26/21 0831  BP: (!) 143/105   Pulse: 75   Resp: 20 (!) 21  Temp: 97.7 F (36.5 C)   SpO2: 94%  100%    Intake/Output Summary (Last 24 hours) at 08/26/2021 1031 Last data filed at 08/25/2021 2100 Gross per 24 hour  Intake 379.97 ml  Output 100 ml  Net 279.97 ml   Filed Weights   08/22/21 1606 08/25/21 0359 08/26/21 0500  Weight: 82.1 kg 82.2 kg 83.7 kg   Weight change: 1.5 kg Body mass index is 30.71 kg/m.   Physical Exam: General exam: Pleasant, middle-aged Caucasian female.  Sitting up in chair.  Not in distress.  Currently on PCA pump.  Not requiring supplemental oxygen at rest skin: No rashes, lesions or ulcers. HEENT: Atraumatic, normocephalic, no obvious bleeding Lungs: Clear to auscultation bilaterally CVS: Tachycardia improving, no murmur.   GI/Abd soft, nontender, nondistended, bowel sound present CNS: Alert, awake, oriented x3 Psychiatry: Cheerful Extremities: Improving bilateral pedal edema, no calf tenderness  Data Review: I have personally reviewed the laboratory data and studies available.  Recent Labs  Lab 08/20/21 0346 08/22/21 0358  WBC 4.8 5.2  NEUTROABS  --  4.7  HGB 12.8 11.8*  HCT 39.3 35.7*  MCV 94.5  92.5  PLT 124* 147*   Recent Labs  Lab 08/20/21 0346 08/21/21 0330 08/22/21 0358 08/23/21 1005 08/23/21 1253 08/24/21 1229 08/24/21 1705 08/25/21 0350 08/25/21 1546  NA 141 141 140  --  138  --   --   --   --   K 3.6 3.6 3.2*  --  3.8  --   --   --   --   CL 107 105 102  --  99  --   --   --   --   CO2 26 29 30   --  26  --   --   --   --   GLUCOSE 111* 161* 127*  --  91  --   --   --   --   BUN <5* 5* 7*  --  9  --   --   --   --   CREATININE 0.57 0.70 0.72  --  0.76  --   --   --   --   CALCIUM 8.6* 9.0 8.8*  --  9.0  --   --   --   --   MG 1.8  --   --  1.7  --  2.1 2.1 2.2 2.0  PHOS  --   --  3.4  --   --  3.1 3.1 3.2 3.4    F/u labs ordered Unresulted Labs (From admission, onward)     Start     Ordered   08/27/21 0500  CBC with Differential/Platelet  Daily,   R     Question:  Specimen collection method  Answer:  Lab=Lab  collect   08/26/21 1031   08/27/21 6283  Basic metabolic panel  Daily,   R     Question:  Specimen collection method  Answer:  Lab=Lab collect   08/26/21 1031   08/27/21 0500  Magnesium  Tomorrow morning,   STAT       Question:  Specimen collection method  Answer:  Lab=Lab collect   08/26/21 1031   08/27/21 0500  Phosphorus  Tomorrow morning,   R       Question:  Specimen collection method  Answer:  Lab=Lab collect   08/26/21 1031            Signed, Terrilee Croak, MD [facility] 08/26/2021

## 2021-08-27 DIAGNOSIS — C3491 Malignant neoplasm of unspecified part of right bronchus or lung: Secondary | ICD-10-CM | POA: Diagnosis not present

## 2021-08-27 DIAGNOSIS — R1319 Other dysphagia: Secondary | ICD-10-CM | POA: Diagnosis not present

## 2021-08-27 DIAGNOSIS — G893 Neoplasm related pain (acute) (chronic): Secondary | ICD-10-CM | POA: Diagnosis not present

## 2021-08-27 DIAGNOSIS — Z515 Encounter for palliative care: Secondary | ICD-10-CM | POA: Diagnosis not present

## 2021-08-27 LAB — BASIC METABOLIC PANEL
Anion gap: 10 (ref 5–15)
BUN: 24 mg/dL — ABNORMAL HIGH (ref 8–23)
CO2: 32 mmol/L (ref 22–32)
Calcium: 8.8 mg/dL — ABNORMAL LOW (ref 8.9–10.3)
Chloride: 97 mmol/L — ABNORMAL LOW (ref 98–111)
Creatinine, Ser: 0.74 mg/dL (ref 0.44–1.00)
GFR, Estimated: >60 mL/min (ref >60)
Glucose, Bld: 152 mg/dL — ABNORMAL HIGH (ref 70–99)
Potassium: 3.8 mmol/L (ref 3.5–5.1)
Sodium: 139 mmol/L (ref 135–145)

## 2021-08-27 LAB — CBC WITH DIFFERENTIAL/PLATELET
Abs Immature Granulocytes: 0.06 10*3/uL (ref 0.00–0.07)
Basophils Absolute: 0 10*3/uL (ref 0.0–0.1)
Basophils Relative: 0 %
Eosinophils Absolute: 0 10*3/uL (ref 0.0–0.5)
Eosinophils Relative: 0 %
HCT: 40.1 % (ref 36.0–46.0)
Hemoglobin: 13.3 g/dL (ref 12.0–15.0)
Immature Granulocytes: 1 %
Lymphocytes Relative: 3 %
Lymphs Abs: 0.2 10*3/uL — ABNORMAL LOW (ref 0.7–4.0)
MCH: 30.7 pg (ref 26.0–34.0)
MCHC: 33.2 g/dL (ref 30.0–36.0)
MCV: 92.6 fL (ref 80.0–100.0)
Monocytes Absolute: 0.3 10*3/uL (ref 0.1–1.0)
Monocytes Relative: 6 %
Neutro Abs: 4.9 10*3/uL (ref 1.7–7.7)
Neutrophils Relative %: 90 %
Platelets: 188 10*3/uL (ref 150–400)
RBC: 4.33 MIL/uL (ref 3.87–5.11)
RDW: 12.9 % (ref 11.5–15.5)
WBC: 5.4 10*3/uL (ref 4.0–10.5)
nRBC: 0 % (ref 0.0–0.2)

## 2021-08-27 LAB — MAGNESIUM: Magnesium: 2.2 mg/dL (ref 1.7–2.4)

## 2021-08-27 LAB — GLUCOSE, CAPILLARY
Glucose-Capillary: 120 mg/dL — ABNORMAL HIGH (ref 70–99)
Glucose-Capillary: 122 mg/dL — ABNORMAL HIGH (ref 70–99)
Glucose-Capillary: 133 mg/dL — ABNORMAL HIGH (ref 70–99)
Glucose-Capillary: 144 mg/dL — ABNORMAL HIGH (ref 70–99)
Glucose-Capillary: 159 mg/dL — ABNORMAL HIGH (ref 70–99)
Glucose-Capillary: 163 mg/dL — ABNORMAL HIGH (ref 70–99)
Glucose-Capillary: 94 mg/dL (ref 70–99)

## 2021-08-27 LAB — PHOSPHORUS: Phosphorus: 3.9 mg/dL (ref 2.5–4.6)

## 2021-08-27 MED ORDER — PANTOPRAZOLE SODIUM 40 MG PO TBEC: 40.0000 mg | DELAYED_RELEASE_TABLET | Freq: Every day | ORAL | Status: DC

## 2021-08-27 MED ORDER — PANTOPRAZOLE 2 MG/ML SUSPENSION
40.0000 mg | Freq: Every day | ORAL | Status: DC
  Administered 2021-08-27 – 2021-08-28 (×2): 40 mg via ORAL
  Filled 2021-08-27 (×2): qty 20

## 2021-08-27 MED ORDER — HYDROMORPHONE 1 MG/ML IV SOLN
INTRAVENOUS | Status: DC
  Administered 2021-08-27: 3.5 mg via INTRAVENOUS
  Administered 2021-08-27: 2 mg via INTRAVENOUS
  Administered 2021-08-28: 0.9 mg via INTRAVENOUS
  Administered 2021-08-28: 1.8 mg via INTRAVENOUS
  Administered 2021-08-28: 1.2 mg via INTRAVENOUS

## 2021-08-27 NOTE — Progress Notes (Signed)
Daily Progress Note   Patient Name: Brittany Mccarty       Date: 08/27/2021 DOB: 18-Dec-1960  Age: 60 y.o. MRN#: [id number] Attending Physician: Terrilee Croak, MD Primary Care Physician: Pcp, No Admit Date: 08/14/2021  Reason for Consultation/Follow-up: Pain control  Subjective: I saw and examined Brittany Mccarty today.  She was sitting in bedside chair at time of my encounter.  She was sleepy but appropriate in conversation.  No family present at the bedside.  I queried her pump which revealed:  24 hours 19 mg of Dilaudid 12 hours 8 mg of Dilaudid 8 hours 4.5 mg of Dilaudid 4 hours 2 mg of Dilaudid 2 hours 0.5 mg of Dilaudid  We discussed continued goal of working to get off of PCA and transition to oral medications, however, at this point in time her needs actually increased over the past 24 hours.  I talked with her about working to taper off of PCA by decreasing demand dose to see if this decreases her needs overall.  She tells me that she is very anxious about stopping PCA and having enough pain medication available.  She finds comfort in having button available and knowing she can push it again as soon as it turns green.  Length of Stay: 12  Current Medications: Scheduled Meds:  . apixaban  5 mg Oral BID  . carvedilol  12.5 mg Per Tube BID WC  . dexamethasone (DECADRON) injection  4 mg Intravenous Q24H  . feeding supplement (PROSource TF)  45 mL Per Tube TID  . fentaNYL  3 patch Transdermal Q72H  . free water  100 mL Per Tube Q4H  . HYDROmorphone   Intravenous Q4H  . methylnaltrexone  12 mg Subcutaneous Q48H  . pantoprazole sodium  40 mg Oral Daily  . senna-docusate  1 tablet Oral QHS  . thiamine injection  100 mg Intravenous Daily  . traZODone  50 mg Per Tube QHS     Continuous Infusions: . feeding supplement (OSMOLITE 1.5 CAL) 1,000 mL (08/27/21 1216)  . lactated ringers 0 mL (08/15/21 1915)  . promethazine (PHENERGAN) injection (IM or IVPB) Stopped (08/24/21 1430)    PRN Meds: acetaminophen, benzonatate, bisacodyl, cyclobenzaprine, diphenhydrAMINE **OR** diphenhydrAMINE, guaiFENesin, hydrALAZINE, ipratropium-albuterol, labetalol, LORazepam, magic mouthwash w/lidocaine, naloxone **AND** sodium chloride flush, ondansetron **OR** ondansetron (ZOFRAN) IV, promethazine (PHENERGAN) injection (  IM or IVPB), promethazine-codeine  Physical Exam        General: Alert, awake, in no acute distress.   HEENT: No bruits, no goiter, no JVD Heart: Regular rate and rhythm. No murmur appreciated. Lungs: Good air movement, clear Abdomen: Soft, nontender, nondistended, positive bowel sounds.   Ext: No significant edema Skin: Warm and dry Neuro: Grossly intact, nonfocal.    Vital Signs: BP 123/80 (BP Location: Right Arm)   Pulse 71   Temp 97.7 F (36.5 C) (Oral)   Resp (!) 22   Ht 5\' 5"  (1.651 m)   Wt 82.9 kg   SpO2 94%   BMI 30.41 kg/m  SpO2: SpO2: 94 % O2 Device: O2 Device: Room Air O2 Flow Rate: O2 Flow Rate (L/min): 0 L/min  Intake/output summary:  Intake/Output Summary (Last 24 hours) at 08/27/2021 1223 Last data filed at 08/27/2021 0000 Gross per 24 hour  Intake 1870.83 ml  Output 450 ml  Net 1420.83 ml    LBM: Last BM Date: 08/20/21 Baseline Weight: Weight: 78.6 kg Most recent weight: Weight: 82.9 kg       Palliative Assessment/Data:      Patient Active Problem List   Diagnosis Date Noted  . Palliative care by specialist   . Protein-calorie malnutrition, severe 08/16/2021  . Chest pain 07/30/2021  . Pneumonitis 07/30/2021  . Dysphagia 07/30/2021  . Bilateral headaches 07/30/2021  . Visual changes 07/30/2021  . Hyponatremia 07/30/2021  . Overweight 07/30/2021  . Cough 07/24/2021  . Hyperkalemia 10/13/2020  . Brain  metastases ([place]) 05/13/2020  . Encounter for antineoplastic chemotherapy 05/03/2020  . Goals of care, counseling/discussion 05/03/2020  . Complication of chest tube 04/24/2020  . Adenocarcinoma of lung, right ([place]) 04/24/2020  . Pulmonary embolism ([place]) 04/24/2020  . Pleuritic chest pain 04/24/2020  . Metastatic cancer ([place]) 04/24/2020    Palliative Care Assessment & Plan   Patient Profile: 60 year old female with metastatic lung cancer, including brain mets, and locally advanced disease that is caused compression of external esophagus requiring PEG placement.  She is currently receiving radiation and genotype testing is pending.  Palliative seeing today for pain management.  Recommendations/Plan: Pain, cancer related: Currently on PCA.  Her needs have not decreased despite increase in fentanyl patch.  In talking with her, I do think there is a component of fear of pain returning that is driving her use of the PCA.  We therefore will continue with PCA today but I am going to decrease the bolus dose to see if her needs decrease with decrease in bolus dose.  Change PCA to 0.3 mg bolus with a 15-minute lockout.   Constipation, opioid related: Continue bowel regimen.  Goals of Care and Additional Recommendations: Limitations on Scope of Treatment: Full Scope Treatment  Code Status:    Code Status Orders  (From admission, onward)           Start     Ordered   08/14/21 1712  Full code  Continuous        08/14/21 1712           Code Status History     Date Active Date Inactive Code Status Order ID Comments User Context   07/30/2021 0823 07/31/2021 2146 Full Code [id number]  Charlann Lange, MD ED   07/30/2021 0738 07/30/2021 0823 Full Code [id number]  Charlann Lange, MD ED   04/24/2020 0311 04/26/2020 1711 Full Code [id number]  Arlan Organ, DO ED      Advance Directive Documentation  Flowsheet Row Most Recent Value  Type of Advance Directive Healthcare Power of Attorney  Pre-existing out of  facility DNR order (yellow form or pink MOST form) --  "MOST" Form in Place? --       Prognosis:  Unable to determine  Discharge Planning: To Be Determined  Care plan was discussed with patient, husband  Thank you for allowing the Palliative Medicine Team to assist in the care of this patient.   Total Time 45 Prolonged Time Billed No   Greater than 50%  of this time was spent counseling and coordinating care related to the above assessment and plan.   Micheline Rough, MD  Please contact Palliative Medicine Team phone at [phone] for questions and concerns.

## 2021-08-27 NOTE — Progress Notes (Signed)
PROGRESS NOTE  Rya Rausch  DOB: [birth year]  PCP: Kathyrn Lass VEL:[id number]  DOA: 08/14/2021  LOS: 12 days  Hospital Day: 77   Chief Complaint  Patient presents with   Dysphagia    Brief narrative: Brittany Mccarty is a 60 y.o. female with PMH significant for metastatic lung cancer, pulmonary embolism. Patient presented to the ED on 9/12 with complaint of progressive dysphagia to a point where it is difficult even to swallow the medications..  Also complained of wheezing, productive cough. Recent hospitalization 8/27-8/29 for acute pulm embolism and dysphagia.  On admission her heart rate was elevated to 116. Chest x-ray showed right lower lobe opacity, positive volume loss and mediastinal retraction to the right side. Admitted to hospitalist service for progressive dysphagia and respiratory failure GI and pulmonary consultations were obtained. See below for details.  Subjective: Patient was seen and examined this morning.  Patient was having an acute episode of vomiting at the time of my evaluation.  States that it was the first time this morning. Patient wanted to advance diet to soft yesterday but apparently she did not tolerate.   Assessment/Plan: Esophageal stenosis -Patient presented with progressive dysphagia for several days  -Underwent EGD on 9/13, noted to have external compression from progressive lung cancer.  Unable to pass scope.   -She had repeat EGD and esophageal dilatation done on 9/15.  Esophageal biopsy showed fibrotic stroma and no intrinsic malignancy. -PEG tube placed on 9/21.  Tube feeding started on 9/22.  Nutrition consult appreciated. -With relief of esophageal obstruction, patient has been able to tolerate liquid diet.  She wanted to advance to soft diet yesterday.  Not tolerating.  We will switch back to full liquid diet. -Also because of nausea and vomiting, I will reduce tube feeding to 30 mill per hour.  Stage IV adenocarcinoma of lung with  brain mets -Outpatient palliative treatment with Tagriso (started 04/2020) and brain radiation (completed 06/22).  -CT chest 09/15 with tumor progression in the posterior right lower lobe  -Patient also is getting radiation treatment.  Noted a plan to continue radiation till Thursday next week.  Per radiation oncology, it can be done as an outpatient as well if patient is otherwise stable for discharge. -Pulmonology offered bronchoscopy for sampling and molecular testing.  Patient prefers to do it outpatient.. -Currently not on supplemental oxygen.  Monitor oxygen level on ambulation.  Cancer related pain -Highly appreciate palliative care involvement in her pain control. -Currently on fentanyl patch 150 mcg every 72 hours, Dilaudid PCA.  Defer to palliative care for pain medicine at this point. -Also on Decadron 4 mg IV daily, plan is to gradually taper it off.  Hx of pulmonary embolism -On Eliquis.  Tachycardia/hypertension -On Coreg 12.5 mg twice daily and Lasix 20 mg IV daily. -Heart rate and blood pressure improving last 24 hours.  Acute respiratory failure with hypoxia -Currently on room air at rest.  Monitor oxygen requirement on ambulation. Continue to monitor.  Wean down as tolerated.  Severe calorie protein malnutrition -Nutrition consult appreciated.  Currently has tube feeding running through PEG tube.  We will reduce to 30 mill per hour to minimize the chance of vomiting.  Mobility: Encourage ambulation Code Status:   Code Status: Full Code  Nutritional status: Body mass index is 30.41 kg/m. Nutrition Problem: Severe Malnutrition Etiology: acute illness, dysphagia Signs/Symptoms: percent weight loss, energy intake < or equal to 50% for > or equal to 5 days Percent weight loss: 7 % Diet:  Diet Order             Diet full liquid Room service appropriate? Yes; Fluid consistency: Thin  Diet effective now                  DVT prophylaxis:  Place and maintain  sequential compression device Start: 08/19/21 0918 apixaban (ELIQUIS) tablet 5 mg   Antimicrobials: None Fluid: Not on IV fluid Consultants: Pulmonology, oncology, radiation oncology Family Communication: Husband at bedside this morning.  Status is: Inpatient  Remains inpatient appropriate because: Palliative radiation till Thursday next week. Dispo: The patient is from: Home              Anticipated d/c is to: Home in next few days              Patient currently is not medically stable to d/c.   Difficult to place patient No     Infusions:   feeding supplement (OSMOLITE 1.5 CAL) 1,000 mL (08/27/21 1216)   lactated ringers 0 mL (08/15/21 1915)   promethazine (PHENERGAN) injection (IM or IVPB) Stopped (08/24/21 1430)    Scheduled Meds:  apixaban  5 mg Oral BID   carvedilol  12.5 mg Per Tube BID WC   dexamethasone (DECADRON) injection  4 mg Intravenous Q24H   feeding supplement (PROSource TF)  45 mL Per Tube TID   fentaNYL  3 patch Transdermal Q72H   free water  100 mL Per Tube Q4H   HYDROmorphone   Intravenous Q4H   methylnaltrexone  12 mg Subcutaneous Q48H   pantoprazole sodium  40 mg Oral Daily   senna-docusate  1 tablet Oral QHS   thiamine injection  100 mg Intravenous Daily   traZODone  50 mg Per Tube QHS    Antimicrobials: Anti-infectives (From admission, onward)    Start     Dose/Rate Route Frequency Ordered Stop   08/22/21 1500  vancomycin (VANCOCIN) IVPB 1000 mg/200 mL premix        1,000 mg 200 mL/hr over 60 Minutes Intravenous To Radiology 08/21/21 1328 08/23/21 1735       PRN meds: acetaminophen, benzonatate, bisacodyl, cyclobenzaprine, diphenhydrAMINE **OR** diphenhydrAMINE, guaiFENesin, hydrALAZINE, ipratropium-albuterol, labetalol, LORazepam, magic mouthwash w/lidocaine, naloxone **AND** sodium chloride flush, ondansetron **OR** ondansetron (ZOFRAN) IV, promethazine (PHENERGAN) injection (IM or IVPB), promethazine-codeine   Objective: Vitals:    08/27/21 1121 08/27/21 1228  BP:  127/85  Pulse:  79  Resp: (!) 22 20  Temp:  98.2 F (36.8 C)  SpO2: 94% 91%    Intake/Output Summary (Last 24 hours) at 08/27/2021 1429 Last data filed at 08/27/2021 0000 Gross per 24 hour  Intake 1630.83 ml  Output --  Net 1630.83 ml   Filed Weights   08/25/21 0359 08/26/21 0500 08/27/21 0457  Weight: 82.2 kg 83.7 kg 82.9 kg   Weight change: -0.8 kg Body mass index is 30.41 kg/m.   Physical Exam: General exam: Pleasant, middle-aged Caucasian female.  Sitting up in chair.  Actively throwing up this morning.  Currently on PCA pump.  Not requiring supplemental oxygen at rest skin: No rashes, lesions or ulcers. HEENT: Atraumatic, normocephalic, no obvious bleeding Lungs: Clear to auscultation bilaterally CVS: Tachycardia improving, no murmur.   GI/Abd soft, nontender, nondistended, bowel sound present CNS: Alert, awake, oriented x3 Psychiatry: Mood appropriate Extremities: Improving bilateral pedal edema, no calf tenderness  Data Review: I have personally reviewed the laboratory data and studies available.  Recent Labs  Lab 08/22/21 0358 08/27/21 0322  WBC 5.2  5.4  NEUTROABS 4.7 4.9  HGB 11.8* 13.3  HCT 35.7* 40.1  MCV 92.5 92.6  PLT 147* 188   Recent Labs  Lab  0000 08/21/21 0330 08/22/21 0358 08/23/21 1005 08/23/21 1253 08/24/21 1229 08/24/21 1705 08/25/21 0350 08/25/21 1546 08/27/21 0322  NA  --  141 140  --  138  --   --   --   --  139  K  --  3.6 3.2*  --  3.8  --   --   --   --  3.8  CL  --  105 102  --  99  --   --   --   --  97*  CO2  --  29 30  --  26  --   --   --   --  32  GLUCOSE  --  161* 127*  --  91  --   --   --   --  152*  BUN  --  5* 7*  --  9  --   --   --   --  24*  CREATININE  --  0.70 0.72  --  0.76  --   --   --   --  0.74  CALCIUM  --  9.0 8.8*  --  9.0  --   --   --   --  8.8*  MG  --   --   --    < >  --  2.1 2.1 2.2 2.0 2.2  PHOS   < >  --  3.4  --   --  3.1 3.1 3.2 3.4 3.9   < > = values in  this interval not displayed.    F/u labs ordered Unresulted Labs (From admission, onward)     Start     Ordered   08/27/21 0500  CBC with Differential/Platelet  Daily,   R     Question:  Specimen collection method  Answer:  Lab=Lab collect   08/26/21 1031   08/27/21 5009  Basic metabolic panel  Daily,   R     Question:  Specimen collection method  Answer:  Lab=Lab collect   08/26/21 1031            Signed, Terrilee Croak, MD [facility] 08/27/2021

## 2021-08-28 ENCOUNTER — Ambulatory Visit
Admission: RE | Admit: 2021-08-28 | Discharge: 2021-08-28 | Disposition: A | Discharge status: Home / Self Care | Payer: 59 | Source: Ambulatory Visit | Attending: Radiation Oncology | Admitting: Radiation Oncology

## 2021-08-28 LAB — CBC WITH DIFFERENTIAL/PLATELET
Abs Immature Granulocytes: 0.06 10*3/uL (ref 0.00–0.07)
Basophils Absolute: 0 10*3/uL (ref 0.0–0.1)
Basophils Relative: 0 %
Eosinophils Absolute: 0 10*3/uL (ref 0.0–0.5)
Eosinophils Relative: 0 %
HCT: 40.9 % (ref 36.0–46.0)
Hemoglobin: 13.6 g/dL (ref 12.0–15.0)
Immature Granulocytes: 1 %
Lymphocytes Relative: 2 %
Lymphs Abs: 0.2 10*3/uL — ABNORMAL LOW (ref 0.7–4.0)
MCH: 30.3 pg (ref 26.0–34.0)
MCHC: 33.3 g/dL (ref 30.0–36.0)
MCV: 91.1 fL (ref 80.0–100.0)
Monocytes Absolute: 0.2 10*3/uL (ref 0.1–1.0)
Monocytes Relative: 3 %
Neutro Abs: 7.5 10*3/uL (ref 1.7–7.7)
Neutrophils Relative %: 94 %
Platelets: 193 10*3/uL (ref 150–400)
RBC: 4.49 MIL/uL (ref 3.87–5.11)
RDW: 13.1 % (ref 11.5–15.5)
WBC: 8 10*3/uL (ref 4.0–10.5)
nRBC: 0 % (ref 0.0–0.2)

## 2021-08-28 LAB — GLUCOSE, CAPILLARY
Glucose-Capillary: 105 mg/dL — ABNORMAL HIGH (ref 70–99)
Glucose-Capillary: 136 mg/dL — ABNORMAL HIGH (ref 70–99)
Glucose-Capillary: 137 mg/dL — ABNORMAL HIGH (ref 70–99)
Glucose-Capillary: 140 mg/dL — ABNORMAL HIGH (ref 70–99)
Glucose-Capillary: 70 mg/dL (ref 70–99)
Glucose-Capillary: 97 mg/dL (ref 70–99)
Glucose-Capillary: 99 mg/dL (ref 70–99)

## 2021-08-28 LAB — BASIC METABOLIC PANEL
Anion gap: 11 (ref 5–15)
BUN: 26 mg/dL — ABNORMAL HIGH (ref 8–23)
CO2: 28 mmol/L (ref 22–32)
Calcium: 8.5 mg/dL — ABNORMAL LOW (ref 8.9–10.3)
Chloride: 93 mmol/L — ABNORMAL LOW (ref 98–111)
Creatinine, Ser: 0.72 mg/dL (ref 0.44–1.00)
GFR, Estimated: >60 mL/min (ref >60)
Glucose, Bld: 139 mg/dL — ABNORMAL HIGH (ref 70–99)
Potassium: 3.8 mmol/L (ref 3.5–5.1)
Sodium: 132 mmol/L — ABNORMAL LOW (ref 135–145)

## 2021-08-28 MED ORDER — POLYETHYLENE GLYCOL 3350 17 G PO PACK: 17.0000 g | PACK | Freq: Every day | ORAL | Status: DC | PRN

## 2021-08-28 MED ORDER — ONDANSETRON HCL 4 MG PO TABS
4.0000 mg | ORAL_TABLET | Freq: Four times a day (QID) | ORAL | Status: DC | PRN
  Administered 2021-08-31: 4 mg
  Filled 2021-08-28: qty 1

## 2021-08-28 MED ORDER — METOCLOPRAMIDE HCL 5 MG/ML IJ SOLN
10.0000 mg | Freq: Three times a day (TID) | INTRAMUSCULAR | Status: DC
  Administered 2021-08-28 – 2021-08-31 (×10): 10 mg via INTRAVENOUS
  Filled 2021-08-28 (×10): qty 2

## 2021-08-28 MED ORDER — CYCLOBENZAPRINE HCL 10 MG PO TABS: 10.0000 mg | ORAL_TABLET | Freq: Two times a day (BID) | ORAL | Status: DC | PRN

## 2021-08-28 MED ORDER — SENNOSIDES-DOCUSATE SODIUM 8.6-50 MG PO TABS
1.0000 | ORAL_TABLET | Freq: Every day | ORAL | Status: DC
  Administered 2021-08-28 – 2021-08-30 (×3): 1
  Filled 2021-08-28 (×3): qty 1

## 2021-08-28 MED ORDER — APIXABAN 5 MG PO TABS
5.0000 mg | ORAL_TABLET | Freq: Two times a day (BID) | ORAL | Status: DC
  Administered 2021-08-28 – 2021-08-31 (×6): 5 mg
  Filled 2021-08-28 (×6): qty 1

## 2021-08-28 MED ORDER — OXYCODONE HCL 5 MG PO TABS
15.0000 mg | ORAL_TABLET | ORAL | Status: DC | PRN
  Administered 2021-08-30 – 2021-08-31 (×5): 15 mg
  Filled 2021-08-28 (×5): qty 3

## 2021-08-28 MED ORDER — GUAIFENESIN 100 MG/5ML PO SOLN: 5.0000 mL | ORAL | Status: DC | PRN

## 2021-08-28 MED ORDER — OXYCODONE HCL 5 MG PO TABS: 15.0000 mg | ORAL_TABLET | ORAL | Status: DC | PRN

## 2021-08-28 MED ORDER — ACETAMINOPHEN 160 MG/5ML PO SOLN: 650.0000 mg | Freq: Four times a day (QID) | ORAL | Status: DC | PRN

## 2021-08-28 MED ORDER — ONDANSETRON HCL 4 MG/2ML IJ SOLN: 4.0000 mg | Freq: Four times a day (QID) | INTRAMUSCULAR | Status: DC | PRN

## 2021-08-28 MED ORDER — ONDANSETRON HCL 4 MG/5ML PO SOLN
4.0000 mg | Freq: Four times a day (QID) | ORAL | Status: DC
  Administered 2021-08-28 – 2021-08-31 (×12): 4 mg
  Filled 2021-08-28 (×15): qty 5

## 2021-08-28 MED ORDER — BISACODYL 10 MG RE SUPP
10.0000 mg | Freq: Once | RECTAL | Status: DC
  Filled 2021-08-28: qty 1

## 2021-08-28 MED ORDER — OSMOLITE 1.5 CAL PO LIQD
1000.0000 mL | ORAL | Status: DC
  Administered 2021-08-28: 1000 mL
  Filled 2021-08-28 (×2): qty 1000

## 2021-08-28 MED ORDER — OXYCODONE HCL 5 MG PO TABS
10.0000 mg | ORAL_TABLET | Freq: Four times a day (QID) | ORAL | Status: DC
  Administered 2021-08-28: 10 mg via ORAL
  Filled 2021-08-28: qty 2

## 2021-08-28 MED ORDER — PANTOPRAZOLE 2 MG/ML SUSPENSION
40.0000 mg | Freq: Every day | ORAL | Status: DC
  Administered 2021-08-29 – 2021-08-31 (×3): 40 mg
  Filled 2021-08-28 (×3): qty 20

## 2021-08-28 MED ORDER — OXYCODONE HCL 5 MG PO TABS
10.0000 mg | ORAL_TABLET | Freq: Four times a day (QID) | ORAL | Status: DC
Start: 2021-08-29 — End: 2021-08-31
  Administered 2021-08-29 – 2021-08-31 (×11): 10 mg
  Filled 2021-08-28 (×11): qty 2

## 2021-08-28 NOTE — Progress Notes (Signed)
Nutrition Follow-up  DOCUMENTATION CODES:   Severe malnutrition in context of acute illness/injury  INTERVENTION:  - Osmolite 1.5 @ 40 ml/hr with 45 ml Prosource TF TID and 100 ml water every 4 hours. - will monitor for ability to continue advancing to goal of 60 ml/hr and then transition to bolus feeds.    NUTRITION DIAGNOSIS:   Severe Malnutrition related to acute illness, dysphagia as evidenced by percent weight loss, energy intake < or equal to 50% for > or equal to 5 days. -ongoing  GOAL:   Patient will meet greater than or equal to 90% of their needs -unmet with current TF rate  MONITOR:   Diet advancement, TF tolerance, Labs, Weight trends, I & O's  ASSESSMENT:   60 y.o. female with medical history of stage 4 metastatic lung cancer on chemo, PE on Eliquis, dysphagia, and recent hospitalization from 8/27-8/29 for acute PE and dysphagia. She presented to the ED due to progressive dysphagia. GI consulted.  Diet advanced from CLD to Soft on 9/24 at 1032. She consumed 100% of lunch and dinner that day. No intakes documented from yesterday. Diet downgraded to FLD yesterday at 1430.  Notes indicate that patient was feeling nauseated and had 1 episode of emesis yesterday. Confirmed by patient and daughter, who is currently at bedside. She has only had ice chips today.  PEG remains in place and functioning well. Osmolite 1.5 had reached 50 ml/hr, but yesterday was decreased to 30 ml/hr after emesis. Remains at this rate currently.   Patient and daughter report that patient usually has a BM daily but that she has not had one since 9/18 when she was given a suppository which produced a large BM.   She was nauseated earlier today, but it was improved/relieved by zofran. Patient has had issues with nausea since cancer dx in 2021. Talked with patient and daughter and about continuing PRN anti-nausea medication vs trial of scheduled anti-nausea medication. They are about interested in  trial of scheduled. Daughter asks about reglan, deferred to MD about which agent to prescribe.   Discussed plan to increase TF to 40 ml/hr once orders in place to aid patient in having a BM and to, hopefully, better control nausea moving forward.   Able to talk with RN prior to entering the room and at bedside when discussing anti-nausea medication orders currently in place.    Weight yesterday was consistent with weights on 9/20 and 9/23.   Labs reviewed; CBGs: 137, 99, 97, 70 mg/dl, Na: 132 mmol/l, Cl: 93 mmol/l, BUN: 26 mg/dl, Ca: 8.5 mg/dl. K, Mg, and Phos currently WDL. Medications reviewed; 10 mg rectal dulcolax x1 dose today, 10 mg IV reglan TID started this afternoon, 40 mg oral protonix/day, 1 tablet senokot/day, 100 mg IV thiamine/day.     Diet Order:   Diet Order             Diet full liquid Room service appropriate? Yes; Fluid consistency: Thin  Diet effective now                   EDUCATION NEEDS:   Not appropriate for education at this time  Skin:  Skin Assessment: Reviewed RN Assessment  Last BM:  9/18 (type 6)  Height:   Ht Readings from Last 1 Encounters:  08/15/21 5\' 5"  (1.651 m)    Weight:   Wt Readings from Last 1 Encounters:  08/27/21 82.9 kg     Estimated Nutritional Needs:  Kcal:  2200-2440 kcal  Protein:  110-130 grams Fluid:  >/= 2.3 L/day     Jarome Matin, MS, RD, LDN, CNSC Inpatient Clinical Dietitian RD pager # available in [facility]  After hours/weekend pager # available in [facility]

## 2021-08-28 NOTE — Progress Notes (Signed)
PROGRESS NOTE  Brittany Mccarty  DOB: 02/03/1961  PCP: Brittany Mccarty UEA:[id number]  DOA: 08/14/2021  LOS: 13 days  Hospital Day: 68   Chief Complaint  Patient presents with   Dysphagia    Brief narrative: Brittany Mccarty is a 60 y.o. female with PMH significant for metastatic lung cancer, pulmonary embolism. Patient presented to the ED on 9/12 with complaint of progressive dysphagia to a point where it is difficult even to swallow the medications..  Also complained of wheezing, productive cough. Recent hospitalization 8/27-8/29 for acute pulm embolism and dysphagia.  On admission her heart rate was elevated to 116. Chest x-ray showed right lower lobe opacity, positive volume loss and mediastinal retraction to the right side. Admitted to hospitalist service for progressive dysphagia and respiratory failure GI and pulmonary consultations were obtained.  During the course of hospitalization, patient got an EGD done with release of esophageal obstruction.  However with poor appetite and recurrent feeling of dysphagia, she needed a PEG tube placement.  She has also been started on radiation treatment which will continue till 9/29. Palliative care consultation following for pain management.  Currently on PCA pump. See below for details  Subjective: Patient was seen and examined this morning.  Lying down on bed.  Says ' I do not feel good today.'  She has a setback after she started vomiting yesterday.  Currently tube feeding is running at a lower rate of 30 ml/hr. she is on full liquid diet.  Assessment/Plan: Esophageal stenosis -Patient presented with progressive dysphagia for several days  -Underwent EGD on 9/13, noted to have external compression from progressive lung cancer.  Unable to pass scope.   -She had repeat EGD and esophageal dilatation done on 9/15.  Esophageal biopsy showed fibrotic stroma and no intrinsic malignancy. -PEG tube placed on 9/21.  Tube feeding started on  9/22.  Nutrition consult appreciated. -After she started vomiting yesterday, currently tube feeding is running at a lower rate of 30 ml/hr. she is on full liquid diet. -Electrolyte levels being monitored. Recent Labs  Lab 08/22/21 0358 08/23/21 1005 08/23/21 1253 08/24/21 1229 08/24/21 1705 08/25/21 0350 08/25/21 1546 08/27/21 0322 08/28/21 0333  K 3.2*  --  3.8  --   --   --   --  3.8 3.8  MG  --    < >  --  2.1 2.1 2.2 2.0 2.2  --   PHOS 3.4  --   --  3.1 3.1 3.2 3.4 3.9  --    < > = values in this interval not displayed.   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  -CT chest 09/15 with tumor progression in the posterior right lower lobe  -Patient also is getting radiation treatment.  Noted a plan to continue radiation till Thursday next week.  Per radiation oncology, it can be done as an outpatient as well if patient is otherwise stable for discharge. -Pulmonology offered bronchoscopy for sampling and molecular testing.  Patient prefers to do it outpatient.. -Currently not on supplemental oxygen.  Monitor oxygen level on ambulation.  Cancer related pain -Highly appreciate palliative care involvement in her pain control. -Currently on fentanyl patch 150 mcg every 72 hours, Dilaudid PCA.  Defer to palliative care for pain medicine at this point. -Also on Decadron 4 mg IV daily, plan is to gradually taper it off.  Hx of pulmonary embolism -On Eliquis.  Essential hypertension -Heart rate  and blood pressure stable on Coreg 12.5 mg twice daily.  Acute respiratory failure with hypoxia -Currently on room air at rest.  Monitor oxygen requirement on ambulation. Continue to monitor.  Wean down as tolerated.  Severe calorie protein malnutrition -Nutrition consult appreciated.  Currently has tube feeding running through PEG tube.    Mobility: Encourage ambulation Code Status:    Code Status: Full Code  Nutritional status: Body mass index is 30.41 kg/m. Nutrition Problem: Severe Malnutrition Etiology: acute illness, dysphagia Signs/Symptoms: percent weight loss, energy intake < or equal to 50% for > or equal to 5 days Percent weight loss: 7 % Diet:  Diet Order             Diet full liquid Room service appropriate? Yes; Fluid consistency: Thin  Diet effective now                  DVT prophylaxis:  Place and maintain sequential compression device Start: 08/19/21 0918 apixaban (ELIQUIS) tablet 5 mg   Antimicrobials: None Fluid: Not on IV fluid Consultants: Pulmonology, oncology, radiation oncology Family Communication: No one at bedside this morning.  Status is: Inpatient  Remains inpatient appropriate because: Palliative radiation till Thursday next week. Dispo: The patient is from: Home              Anticipated d/c is to: Home in next few days              Patient currently is not medically stable to d/c.   Difficult to place patient No     Infusions:   feeding supplement (OSMOLITE 1.5 CAL) 1,000 mL (08/27/21 1216)   lactated ringers 0 mL (08/15/21 1915)   promethazine (PHENERGAN) injection (IM or IVPB) Stopped (08/24/21 1430)    Scheduled Meds:  apixaban  5 mg Per Tube BID   carvedilol  12.5 mg Per Tube BID WC   dexamethasone (DECADRON) injection  4 mg Intravenous Q24H   feeding supplement (PROSource TF)  45 mL Per Tube TID   fentaNYL  3 patch Transdermal Q72H   free water  100 mL Per Tube Q4H   HYDROmorphone   Intravenous Q4H   methylnaltrexone  12 mg Subcutaneous Q48H   pantoprazole sodium  40 mg Oral Daily   senna-docusate  1 tablet Oral QHS   thiamine injection  100 mg Intravenous Daily   traZODone  50 mg Per Tube QHS    Antimicrobials: Anti-infectives (From admission, onward)    Start     Dose/Rate Route Frequency Ordered Stop   08/22/21 1500  vancomycin (VANCOCIN) IVPB 1000 mg/200 mL premix        1,000 mg 200 mL/hr  over 60 Minutes Intravenous To Radiology 08/21/21 1328 08/23/21 1735       PRN meds: acetaminophen, benzonatate, bisacodyl, cyclobenzaprine, diphenhydrAMINE **OR** diphenhydrAMINE, guaiFENesin, hydrALAZINE, ipratropium-albuterol, labetalol, LORazepam, magic mouthwash w/lidocaine, naloxone **AND** sodium chloride flush, ondansetron **OR** ondansetron (ZOFRAN) IV, promethazine (PHENERGAN) injection (IM or IVPB), promethazine-codeine   Objective: Vitals:   08/28/21 0900 08/28/21 0913  BP:  128/83  Pulse:  89  Resp: (!) 23   Temp:    SpO2: 94% 94%    Intake/Output Summary (Last 24 hours) at 08/28/2021 1031 Last data filed at 08/28/2021 0700 Gross per 24 hour  Intake 875.33 ml  Output --  Net 875.33 ml    Filed Weights   08/25/21 0359 08/26/21 0500 08/27/21 0457  Weight: 82.2 kg 83.7 kg 82.9 kg   Weight change:  Body mass index  is 30.41 kg/m.   Physical Exam: General exam: Pleasant, middle-aged Caucasian female.  Lying down in bed.  Looks tired today. skin: No rashes, lesions or ulcers. HEENT: Atraumatic, normocephalic, no obvious bleeding Lungs: Clear to auscultation bilaterally CVS: Tachycardia improving, no murmur.   GI/Abd soft, nontender, nondistended, bowel sound present CNS: Alert, awake, oriented x3 Psychiatry: Depressed look Extremities: Improving bilateral pedal edema, no calf tenderness  Data Review: I have personally reviewed the laboratory data and studies available.  Recent Labs  Lab 08/22/21 0358 08/27/21 0322 08/28/21 0333  WBC 5.2 5.4 8.0  NEUTROABS 4.7 4.9 7.5  HGB 11.8* 13.3 13.6  HCT 35.7* 40.1 40.9  MCV 92.5 92.6 91.1  PLT 147* 188 193    Recent Labs  Lab 08/22/21 0358 08/23/21 1005 08/23/21 1253 08/24/21 1229 08/24/21 1705 08/25/21 0350 08/25/21 1546 08/27/21 0322 08/28/21 0333  NA 140  --  138  --   --   --   --  139 132*  K 3.2*  --  3.8  --   --   --   --  3.8 3.8  CL 102  --  99  --   --   --   --  97* 93*  CO2 30  --  26   --   --   --   --  32 28  GLUCOSE 127*  --  91  --   --   --   --  152* 139*  BUN 7*  --  9  --   --   --   --  24* 26*  CREATININE 0.72  --  0.76  --   --   --   --  0.74 0.72  CALCIUM 8.8*  --  9.0  --   --   --   --  8.8* 8.5*  MG  --    < >  --  2.1 2.1 2.2 2.0 2.2  --   PHOS 3.4  --   --  3.1 3.1 3.2 3.4 3.9  --    < > = values in this interval not displayed.     F/u labs ordered Unresulted Labs (From admission, onward)     Start     Ordered   08/27/21 0500  CBC with Differential/Platelet  Daily,   R     Question:  Specimen collection method  Answer:  Lab=Lab collect   08/26/21 1031   08/27/21 4503  Basic metabolic panel  Daily,   R     Question:  Specimen collection method  Answer:  Lab=Lab collect   08/26/21 1031            Signed, Terrilee Croak, MD [facility] 08/28/2021

## 2021-08-29 ENCOUNTER — Telehealth: Payer: Self-pay | Admitting: Medical Oncology

## 2021-08-29 ENCOUNTER — Ambulatory Visit
Admission: RE | Admit: 2021-08-29 | Discharge: 2021-08-29 | Disposition: A | Discharge status: Home / Self Care | Payer: 59 | Source: Ambulatory Visit | Attending: Radiation Oncology | Admitting: Radiation Oncology

## 2021-08-29 LAB — CBC WITH DIFFERENTIAL/PLATELET
Abs Immature Granulocytes: 0.1 10*3/uL — ABNORMAL HIGH (ref 0.00–0.07)
Basophils Absolute: 0 10*3/uL (ref 0.0–0.1)
Basophils Relative: 0 %
Eosinophils Absolute: 0 10*3/uL (ref 0.0–0.5)
Eosinophils Relative: 0 %
HCT: 36.1 % (ref 36.0–46.0)
Hemoglobin: 12.1 g/dL (ref 12.0–15.0)
Immature Granulocytes: 1 %
Lymphocytes Relative: 2 %
Lymphs Abs: 0.2 10*3/uL — ABNORMAL LOW (ref 0.7–4.0)
MCH: 30.6 pg (ref 26.0–34.0)
MCHC: 33.5 g/dL (ref 30.0–36.0)
MCV: 91.4 fL (ref 80.0–100.0)
Monocytes Absolute: 0.5 10*3/uL (ref 0.1–1.0)
Monocytes Relative: 5 %
Neutro Abs: 8.7 10*3/uL — ABNORMAL HIGH (ref 1.7–7.7)
Neutrophils Relative %: 92 %
Platelets: 183 10*3/uL (ref 150–400)
RBC: 3.95 MIL/uL (ref 3.87–5.11)
RDW: 13 % (ref 11.5–15.5)
WBC: 9.5 10*3/uL (ref 4.0–10.5)
nRBC: 0 % (ref 0.0–0.2)

## 2021-08-29 LAB — BASIC METABOLIC PANEL
Anion gap: 7 (ref 5–15)
BUN: 27 mg/dL — ABNORMAL HIGH (ref 8–23)
CO2: 33 mmol/L — ABNORMAL HIGH (ref 22–32)
Calcium: 8.4 mg/dL — ABNORMAL LOW (ref 8.9–10.3)
Chloride: 93 mmol/L — ABNORMAL LOW (ref 98–111)
Creatinine, Ser: 0.86 mg/dL (ref 0.44–1.00)
GFR, Estimated: >60 mL/min (ref >60)
Glucose, Bld: 116 mg/dL — ABNORMAL HIGH (ref 70–99)
Potassium: 4.2 mmol/L (ref 3.5–5.1)
Sodium: 133 mmol/L — ABNORMAL LOW (ref 135–145)

## 2021-08-29 LAB — GLUCOSE, CAPILLARY
Glucose-Capillary: 128 mg/dL — ABNORMAL HIGH (ref 70–99)
Glucose-Capillary: 113 mg/dL — ABNORMAL HIGH (ref 70–99)
Glucose-Capillary: 101 mg/dL — ABNORMAL HIGH (ref 70–99)
Glucose-Capillary: 131 mg/dL — ABNORMAL HIGH (ref 70–99)
Glucose-Capillary: 142 mg/dL — ABNORMAL HIGH (ref 70–99)

## 2021-08-29 MED ORDER — THIAMINE HCL 100 MG PO TABS
100.0000 mg | ORAL_TABLET | Freq: Every day | ORAL | Status: DC
  Administered 2021-08-30 – 2021-08-31 (×2): 100 mg
  Filled 2021-08-29 (×2): qty 1

## 2021-08-29 MED ORDER — OSMOLITE 1.5 CAL PO LIQD
1000.0000 mL | ORAL | Status: DC
  Administered 2021-08-29 (×2): 1000 mL
  Filled 2021-08-29 (×2): qty 1000

## 2021-08-29 NOTE — Plan of Care (Signed)
  Problem: Coping: Goal: Level of anxiety will decrease Outcome: Progressing   Problem: Pain Managment: Goal: General experience of comfort will improve Outcome: Progressing   Problem: Safety: Goal: Ability to remain free from injury will improve Outcome: Progressing   Problem: Skin Integrity: Goal: Risk for impaired skin integrity will decrease Outcome: Progressing   

## 2021-08-29 NOTE — Progress Notes (Signed)
CHART NOTE  Daughter Vicente Males called the office and asked if her Mom can start the new regimen of treatment while she is in the hospital. I saw the patient this afternoon and had a lengthy discussion with her. Her condition is still inappropriate to start the new therapy with ramucirumab and Erlotinib.  She still has significant dysphagia and she is dependent on PEG tube feeding.  She is able to swallow some liquid but she may not be able to swallow Erlotinib tablet.  The cost of ramucirumab is so prohibitive to start this treatment during hospitalization. She will also need to complete the course of palliative radiotherapy before starting the new treatment. Other treatment option will be switching her targeted therapy to afatinib based on the effect of this drug in patient with the uncommon mutations. The patient has full understanding of the reason for not starting her treatment during her hospitalization and she is in agreement with the current plan. Once the patient is discharged from the hospital, I will arrange for her follow-up visit for more detailed discussion of these options and also to consider her for treatment with one of the 2 options.

## 2021-08-29 NOTE — Progress Notes (Signed)
SATURATION QUALIFICATIONS: (This note is used to comply with regulatory documentation for home oxygen)  Patient Saturations on Room Air at Rest = 88%  Patient Saturations on Room Air while Ambulating = 83%  Patient Saturations on 2 Liters of oxygen while Ambulating = 94%  Please briefly explain why patient needs home oxygen: patient has lung cancer and recent history of PE

## 2021-08-29 NOTE — Telephone Encounter (Signed)
New Treatment  Dtr , Brittany Mccarty ,called and said Brittany Mccarty wants to start the ramucirumab and osimertininb ASAP.   "What is reason for not starting tx in the  hospital"?  I told dtr  that Dr Julien Nordmann  wants Brittany Mccarty to -recover from the hospital events  -evaluate her as an outpt   -complete radiation  first  -insurance has to [employer] may not cover it because she was admitted for a new problem.  She was not satisfied with these reasons . "Can Brittany Mccarty see her this week to discuss the plan"?

## 2021-08-29 NOTE — Progress Notes (Signed)
Daily Progress Note   Patient Name: Brittany Mccarty       Date: 08/29/2021 DOB: [birth year]  Age: 60 y.o. MRN#: [id number] Attending Physician: Terrilee Croak, MD Primary Care Physician: Pcp, No Admit Date: 08/14/2021  Reason for Consultation/Follow-up: Pain control  I saw and examined Brittany Mccarty today.  She continues to weak and has difficulty focusing on conversation. Episodes of severe nausea and vomiting this AM. Tube feeding was reduced.  I queried her pump which revealed:  24 hours 10.6mg  of Dilaudid (57 demands) 12 hours 6 mg of Dilaudid 8 hours 2 mg of Dilaudid 4 hours 2 mg of Dilaudid 2 hours 0.6mg  of Dilaudid  She denies having pain. We discussed how she is using the button.  Length of Stay: 14  Current Medications: Scheduled Meds:  . apixaban  5 mg Per Tube BID  . bisacodyl  10 mg Rectal Once  . carvedilol  12.5 mg Per Tube BID WC  . dexamethasone (DECADRON) injection  4 mg Intravenous Q24H  . feeding supplement (OSMOLITE 1.5 CAL)  1,000 mL Per Tube Q24H  . feeding supplement (PROSource TF)  45 mL Per Tube TID  . fentaNYL  3 patch Transdermal Q72H  . free water  100 mL Per Tube Q4H  . methylnaltrexone  12 mg Subcutaneous Q48H  . metoCLOPramide (REGLAN) injection  10 mg Intravenous Q8H  . ondansetron  4 mg Per Tube Q6H  . oxyCODONE  10 mg Per Tube Q6H  . pantoprazole sodium  40 mg Per Tube Daily  . senna-docusate  1 tablet Per Tube QHS  . thiamine injection  100 mg Intravenous Daily  . traZODone  50 mg Per Tube QHS    Continuous Infusions: . lactated ringers 0 mL (08/15/21 1915)    PRN Meds: acetaminophen (TYLENOL) oral liquid 160 mg/5 mL, benzonatate, bisacodyl, cyclobenzaprine, guaiFENesin, hydrALAZINE, ipratropium-albuterol, labetalol, LORazepam, magic  mouthwash w/lidocaine, ondansetron **OR** ondansetron (ZOFRAN) IV, oxyCODONE, polyethylene glycol  Physical Exam        General: Alert, awake, in no acute distress.   HEENT: No bruits, no goiter, no JVD Heart: Regular rate and rhythm. No murmur appreciated. Lungs: Good air movement, clear Abdomen: Soft, nontender, nondistended, positive bowel sounds.   Ext: No significant edema Skin: Warm and dry Neuro: Grossly intact, nonfocal.    Vital Signs: BP  126/79 (BP Location: Left Arm)   Pulse 78   Temp 97.7 F (36.5 C) (Oral)   Resp 20   Ht 5\' 5"  (1.651 m)   Wt 85.8 kg   SpO2 98%   BMI 31.48 kg/m  SpO2: SpO2: 98 % O2 Device: O2 Device: Nasal Cannula O2 Flow Rate: O2 Flow Rate (L/min): 2 L/min  Intake/output summary:  Intake/Output Summary (Last 24 hours) at 08/29/2021 0548 Last data filed at 08/29/2021 0220 Gross per 24 hour  Intake 1892.34 ml  Output 800 ml  Net 1092.34 ml    LBM: Last BM Date: 08/28/21 Baseline Weight: Weight: 78.6 kg Most recent weight: Weight: 85.8 kg    Patient Active Problem List   Diagnosis Date Noted  . Palliative care by specialist   . Protein-calorie malnutrition, severe 08/16/2021  . Chest pain 07/30/2021  . Pneumonitis 07/30/2021  . Dysphagia 07/30/2021  . Bilateral headaches 07/30/2021  . Visual changes 07/30/2021  . Hyponatremia 07/30/2021  . Overweight 07/30/2021  . Cough 07/24/2021  . Hyperkalemia 10/13/2020  . Brain metastases ([place]) 05/13/2020  . Encounter for antineoplastic chemotherapy 05/03/2020  . Goals of care, counseling/discussion 05/03/2020  . Complication of chest tube 04/24/2020  . Adenocarcinoma of lung, right ([place]) 04/24/2020  . Pulmonary embolism ([place]) 04/24/2020  . Pleuritic chest pain 04/24/2020  . Metastatic cancer ([place]) 04/24/2020    Palliative Care Assessment & Plan   Patient Profile: 60 year old female with metastatic lung cancer, including brain mets, and locally advanced disease that is caused  compression of external esophagus requiring PEG placement.  She is currently receiving radiation to be complete on Thursday and genotype testing indicates treatment options and those have been discussed with Dr. Earlie Server.  Palliative seeing for pain management.  Recommendations/Plan: Pain, cancer related: Currently on PCA.  Her needs have not decreased despite increase in fentanyl patch. Much less PCA use today, but still significant use totals. Increasing concern for neurotoxicity associated with ongoing high dose of hydromorphone and inconsistent use hx.Will discontinue PCA, add oxycodone per tube and see if her mental status clears. If her confusion and disorientation persists will need to consider re-scan of her brain.  Nausea/Vomiting: multifactorial. Scheduled zofran and reglan. Constipation, opioid related: Continue bowel regimen. Pre-treat for radiation with oxycodone, decadron and zofran.  Family Updated: I spoke with her daughter Brittany Mccarty at length about the plan above and also discussed the need for [place] to talk more about her goals of care and engage in advance care planning. I provided Hard Choices Book and MOST form. Daughter agrees this is an important conversation and one that has been difficult for her mother.   Goals of Care and Additional Recommendations: Limitations on Scope of Treatment: Full Scope Treatment  Code Status:    Code Status Orders  (From admission, onward)           Start     Ordered   08/14/21 1712  Full code  Continuous        08/14/21 1712           Code Status History     Date Active Date Inactive Code Status Order ID Comments User Context   07/30/2021 0823 07/31/2021 2146 Full Code [id number]  Charlann Lange, MD ED   07/30/2021 0738 07/30/2021 0823 Full Code [id number]  Charlann Lange, MD ED   04/24/2020 0311 04/26/2020 1711 Full Code [id number]  Arlan Organ, DO ED      Advance Directive Documentation    Flowsheet Row Most Recent  Value  Type of Advance  Directive Healthcare Power of Attorney  Pre-existing out of facility DNR order (yellow form or pink MOST form) --  "MOST" Form in Place? --       Prognosis:  Unable to determine  Discharge Planning: To Be Determined  Care plan was discussed with patient, husband  Thank you for allowing the Palliative Medicine Team to assist in the care of this patient.   Total Time 35 Prolonged Time Billed No   Greater than 50%  of this time was spent counseling and coordinating care related to the above assessment and plan.   Lane Hacker, DO  Please contact Palliative Medicine Team phone at [phone] for questions and concerns.

## 2021-08-29 NOTE — Progress Notes (Signed)
PROGRESS NOTE  Brittany Mccarty  DOB: 10-23-1961  PCP: Kathyrn Lass ZOX:[id number]  DOA: 08/14/2021  LOS: 14 days  Hospital Day: 77   Chief Complaint  Patient presents with   Dysphagia    Brief narrative: Brittany Mccarty is a 60 y.o. female with PMH significant for metastatic lung cancer, pulmonary embolism. Patient presented to the ED on 9/12 with complaint of progressive dysphagia to a point where it is difficult even to swallow the medications..  Also complained of wheezing, productive cough. Recent hospitalization 8/27-8/29 for acute pulm embolism and dysphagia.  On admission her heart rate was elevated to 116. Chest x-ray showed right lower lobe opacity, positive volume loss and mediastinal retraction to the right side. Admitted to hospitalist service for progressive dysphagia and respiratory failure GI and pulmonary consultations were obtained.  During the course of hospitalization, patient got an EGD done with release of esophageal obstruction.  However with poor appetite and recurrent feeling of dysphagia, she needed a PEG tube placement.  She has also been started on radiation treatment which will continue till 9/29. Palliative care consultation following for pain management.  She had to be on PCA pump for a few days, was taken off yesterday 9/26.  Currently mentating better on oxycodone.  Subjective: Patient was seen and examined this morning.  Propped up in bed.  Not in distress.  Feels better than yesterday.  Husband at bedside.  I saw the patient together with palliative Dr. Hilma Mccarty this morning.  Assessment/Plan: Esophageal stenosis -Patient presented with progressive dysphagia for several days  -Underwent EGD on 9/13, noted to have external compression from progressive lung cancer.  Was not able to pass scope.   -She had repeat EGD and esophageal dilatation done on 9/15.  Esophageal biopsy showed fibrotic stroma and no intrinsic malignancy. -PEG tube placed on 9/21.   Tube feeding was started.  Nutrition consult appreciated.  Currently on 40 mill per hour of tube feeding.  Liquid diet allowed.  Advance as tolerated. 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  -CT chest 09/15 with tumor progression in the posterior right lower lobe  -Patient also is getting radiation treatment.  Noted a plan to continue radiation till Thursday next week.  Per radiation oncology, it can be done as an outpatient as well if patient is otherwise stable for discharge. -Pulmonology offered bronchoscopy for sampling and molecular testing.  Patient prefers to do it outpatient.. -Currently not on supplemental oxygen.  Monitor oxygen level on ambulation.  Cancer related pain -Highly appreciate palliative care involvement in her pain control. -Currently on fentanyl patch 150 mcg every 72 hours, oral oxycodone.  Dilaudid PCA pump was stopped yesterday.  Defer to palliative care for pain medicine at this point. -Also on Decadron 4 mg IV daily, plan is to gradually taper it off.  Hx of pulmonary embolism -On Eliquis.  Essential hypertension -Heart rate and blood pressure stable on Coreg 12.5 mg twice daily.  Acute respiratory failure with hypoxia -Currently on room air at  rest.  Monitor oxygen requirement on ambulation. Continue to monitor.  Wean down as tolerated.  Severe calorie protein malnutrition -Nutrition consult appreciated.  Currently has tube feeding running through PEG tube.    Mobility: Encourage ambulation.  Advised nurse to take her for a walk today on the hallway.  Check oxygen protocol. Code Status:    Code Status: Full Code  Nutritional status: Body mass index is 31.48 kg/m. Nutrition Problem: Severe Malnutrition Etiology: acute illness, dysphagia Signs/Symptoms: percent weight loss, energy intake < or equal to 50% for > or equal to 5 days Percent weight loss: 7 % Diet:  Diet Order             Diet full liquid Room service appropriate? Yes; Fluid consistency: Thin  Diet effective now                  DVT prophylaxis:  Place and maintain sequential compression device Start: 08/19/21 0918 apixaban (ELIQUIS) tablet 5 mg   Antimicrobials: None Fluid: Not on IV fluid Consultants: Pulmonology, oncology, radiation oncology Family Communication: Husband at bedside today.  Status is: Inpatient  Remains inpatient appropriate because: Palliative radiation till Thursday next week.  Plan is to get her ready by then for discharge to home. Dispo: The patient is from: Home              Anticipated d/c is to: Home in next few days              Patient currently is not medically stable to d/c.   Difficult to place patient No     Infusions:   feeding supplement (OSMOLITE 1.5 CAL) 1,000 mL (08/29/21 1229)   lactated ringers 0 mL (08/15/21 1915)    Scheduled Meds:  apixaban  5 mg Per Tube BID   bisacodyl  10 mg Rectal Once   carvedilol  12.5 mg Per Tube BID WC   dexamethasone (DECADRON) injection  4 mg Intravenous Q24H   feeding supplement (PROSource TF)  45 mL Per Tube TID   fentaNYL  3 patch Transdermal Q72H   free water  100 mL Per Tube Q4H   methylnaltrexone  12 mg Subcutaneous Q48H   metoCLOPramide (REGLAN) injection  10 mg Intravenous Q8H   ondansetron  4 mg Per Tube Q6H   oxyCODONE  10 mg Per Tube Q6H   pantoprazole sodium  40 mg Per Tube Daily   senna-docusate  1 tablet Per Tube QHS   [START ON 08/30/2021] thiamine  100 mg Per Tube Daily   traZODone  50 mg Per Tube QHS    Antimicrobials: Anti-infectives (From admission, onward)    Start     Dose/Rate Route  Frequency Ordered Stop   08/22/21 1500  vancomycin (VANCOCIN) IVPB 1000 mg/200 mL premix        1,000 mg 200 mL/hr over 60 Minutes Intravenous To Radiology 08/21/21 1328 08/23/21 1735       PRN meds: acetaminophen (TYLENOL) oral liquid 160 mg/5 mL, benzonatate, bisacodyl, cyclobenzaprine, guaiFENesin, hydrALAZINE, ipratropium-albuterol, labetalol, LORazepam, magic mouthwash w/lidocaine, ondansetron **OR** ondansetron (ZOFRAN) IV, oxyCODONE, polyethylene glycol   Objective: Vitals:   08/29/21 0858 08/29/21 1530  BP: 117/84 (!) 127/93  Pulse: 84 100  Resp: 18 18  Temp: (!) 97.4 F (36.3 C) 97.6 F (36.4 C)  SpO2: 99% 94%    Intake/Output Summary (Last 24 hours) at 08/29/2021 1619 Last data filed at 08/29/2021 1115 Gross per 24 hour  Intake 1692.67 ml  Output 1300 ml  Net 392.67 ml   Filed Weights   08/26/21 0500 08/27/21 0457 08/29/21 0412  Weight: 83.7 kg 82.9 kg 85.8 kg   Weight change:  Body mass index is 31.48 kg/m.   Physical Exam: General exam: Pleasant, middle-aged Caucasian female.  Lying down in bed.  Feels better.  Looks better today.   Skin: No rashes, lesions or ulcers. HEENT: Atraumatic, normocephalic, no obvious bleeding Lungs: Clear to auscultation bilaterally CVS: Tachycardia improving, no murmur.   GI/Abd: soft, nontender, nondistended, bowel sound present CNS: Alert, awake, oriented x3 Psychiatry: Depressed look Extremities: Improving bilateral pedal edema, no calf tenderness  Data Review: I have personally reviewed the laboratory data and studies available.  Recent Labs  Lab 08/27/21 0322 08/28/21 0333 08/29/21 0424  WBC 5.4 8.0 9.5  NEUTROABS 4.9 7.5 8.7*  HGB 13.3 13.6 12.1  HCT 40.1 40.9 36.1  MCV 92.6 91.1 91.4  PLT 188 193 183   Recent Labs  Lab 08/23/21 1253 08/24/21 1229 08/24/21 1705 08/25/21 0350 08/25/21 1546 08/27/21 0322 08/28/21 0333 08/29/21 0424  NA 138  --   --   --   --  139 132* 133*  K 3.8  --   --   --   --   3.8 3.8 4.2  CL 99  --   --   --   --  97* 93* 93*  CO2 26  --   --   --   --  32 28 33*  GLUCOSE 91  --   --   --   --  152* 139* 116*  BUN 9  --   --   --   --  24* 26* 27*  CREATININE 0.76  --   --   --   --  0.74 0.72 0.86  CALCIUM 9.0  --   --   --   --  8.8* 8.5* 8.4*  MG  --  2.1 2.1 2.2 2.0 2.2  --   --   PHOS  --  3.1 3.1 3.2 3.4 3.9  --   --     F/u labs ordered Unresulted Labs (From admission, onward)     Start     Ordered   08/30/21 0500  CBC with Differential/Platelet  Daily,   R     Question:  Specimen collection method  Answer:  Lab=Lab collect   08/29/21 1619   08/30/21 5809  Basic metabolic panel  Daily,   R     Question:  Specimen collection method  Answer:  Lab=Lab collect   08/29/21 1619            Signed, Terrilee Croak, MD [facility] 08/29/2021

## 2021-08-29 NOTE — Progress Notes (Signed)
NUTRITION NOTE  Patient seen yesterday and full follow-up note written at that time. Patient now has scheduled IV reglan ordered and received a suppository yesterday d/t no BM since 9/18.  She had a large type 6 BM yesterday afternoon and had a BM shortly before RD visit this AM.   She denies any nausea since IV reglan order placed.   She is currently receiving Osmolite 1.5 @ 40 ml/hr with 45 ml Prosource TF TID and 100 ml free water every 4 hours. Patient agreeable to increase Osmolite 1.5 to 50 ml/hr (goal: 60 ml/hr) today.       Jarome Matin, MS, RD, LDN, CNSC Inpatient Clinical Dietitian RD pager # available in [facility]  After hours/weekend pager # available in [facility]

## 2021-08-30 ENCOUNTER — Encounter: Payer: Self-pay | Admitting: Radiation Oncology

## 2021-08-30 ENCOUNTER — Ambulatory Visit
Admission: RE | Admit: 2021-08-30 | Discharge: 2021-08-30 | Disposition: A | Discharge status: Home / Self Care | Payer: 59 | Source: Ambulatory Visit | Attending: Radiation Oncology | Admitting: Radiation Oncology

## 2021-08-30 ENCOUNTER — Institutional Professional Consult (permissible substitution): Payer: 59 | Admitting: Pulmonary Disease

## 2021-08-30 ENCOUNTER — Inpatient Hospital Stay: Admission: RE | Admit: 2021-08-30 | Payer: 59 | Source: Ambulatory Visit

## 2021-08-30 LAB — CBC WITH DIFFERENTIAL/PLATELET
Abs Immature Granulocytes: 0.09 10*3/uL — ABNORMAL HIGH (ref 0.00–0.07)
Basophils Absolute: 0 10*3/uL (ref 0.0–0.1)
Basophils Relative: 0 %
Eosinophils Absolute: 0 10*3/uL (ref 0.0–0.5)
Eosinophils Relative: 0 %
HCT: 36.8 % (ref 36.0–46.0)
Hemoglobin: 12.3 g/dL (ref 12.0–15.0)
Immature Granulocytes: 1 %
Lymphocytes Relative: 2 %
Lymphs Abs: 0.2 10*3/uL — ABNORMAL LOW (ref 0.7–4.0)
MCH: 30.7 pg (ref 26.0–34.0)
MCHC: 33.4 g/dL (ref 30.0–36.0)
MCV: 91.8 fL (ref 80.0–100.0)
Monocytes Absolute: 0.3 10*3/uL (ref 0.1–1.0)
Monocytes Relative: 4 %
Neutro Abs: 8.7 10*3/uL — ABNORMAL HIGH (ref 1.7–7.7)
Neutrophils Relative %: 93 %
Platelets: 172 10*3/uL (ref 150–400)
RBC: 4.01 MIL/uL (ref 3.87–5.11)
RDW: 13.1 % (ref 11.5–15.5)
WBC: 9.3 10*3/uL (ref 4.0–10.5)
nRBC: 0 % (ref 0.0–0.2)

## 2021-08-30 LAB — GLUCOSE, CAPILLARY
Glucose-Capillary: 160 mg/dL — ABNORMAL HIGH (ref 70–99)
Glucose-Capillary: 122 mg/dL — ABNORMAL HIGH (ref 70–99)
Glucose-Capillary: 135 mg/dL — ABNORMAL HIGH (ref 70–99)
Glucose-Capillary: 135 mg/dL — ABNORMAL HIGH (ref 70–99)
Glucose-Capillary: 116 mg/dL — ABNORMAL HIGH (ref 70–99)
Glucose-Capillary: 121 mg/dL — ABNORMAL HIGH (ref 70–99)

## 2021-08-30 LAB — BASIC METABOLIC PANEL
Anion gap: 9 (ref 5–15)
BUN: 26 mg/dL — ABNORMAL HIGH (ref 8–23)
CO2: 32 mmol/L (ref 22–32)
Calcium: 9.2 mg/dL (ref 8.9–10.3)
Chloride: 97 mmol/L — ABNORMAL LOW (ref 98–111)
Creatinine, Ser: 0.84 mg/dL (ref 0.44–1.00)
GFR, Estimated: >60 mL/min (ref >60)
Glucose, Bld: 146 mg/dL — ABNORMAL HIGH (ref 70–99)
Potassium: 4.4 mmol/L (ref 3.5–5.1)
Sodium: 138 mmol/L (ref 135–145)

## 2021-08-30 MED ORDER — FENTANYL CITRATE PF 50 MCG/ML IJ SOSY
50.0000 ug | PREFILLED_SYRINGE | INTRAMUSCULAR | Status: AC
Start: 2021-08-30 — End: 2021-08-30
  Administered 2021-08-30: 50 ug via INTRAVENOUS
  Filled 2021-08-30: qty 1

## 2021-08-30 MED ORDER — CLONAZEPAM 0.5 MG PO TABS
0.2500 mg | ORAL_TABLET | Freq: Two times a day (BID) | ORAL | Status: DC
  Administered 2021-08-30 – 2021-08-31 (×2): 0.25 mg via ORAL
  Filled 2021-08-30 (×3): qty 1

## 2021-08-30 MED ORDER — FENTANYL 50 MCG/HR TD PT72
3.0000 | MEDICATED_PATCH | TRANSDERMAL | Status: DC
  Administered 2021-08-30: 3 via TRANSDERMAL
  Filled 2021-08-30: qty 2
  Filled 2021-08-30: qty 1

## 2021-08-30 MED ORDER — OSMOLITE 1.5 CAL PO LIQD
1000.0000 mL | ORAL | Status: DC
  Administered 2021-08-30: 1000 mL
  Filled 2021-08-30 (×2): qty 1000

## 2021-08-30 NOTE — Progress Notes (Signed)
PT Cancellation Note  Patient Details Name: Brittany Mccarty MRN: [id number] DOB: 1960-12-25   Cancelled Treatment:    Reason Eval/Treat Not Completed: Pain limiting ability to participate, noted has been ambulating  with nursing. Will follow tomorrow.   Claretha Cooper 08/30/2021, 3:55 PM [place] Pager [phone] Office [phone]

## 2021-08-30 NOTE — Progress Notes (Addendum)
PROGRESS NOTE  Brittany Mccarty  DOB: 01/10/1961  PCP: Kathyrn Lass YQI:[id number]  DOA: 08/14/2021  LOS: 15 days  Hospital Day: 63   Chief Complaint  Patient presents with   Dysphagia    Brief narrative: Brittany Mccarty is a 60 y.o. female with PMH significant for metastatic lung cancer, pulmonary embolism. Patient presented to the ED on 9/12 with complaint of progressive dysphagia to a point where it is difficult even to swallow the medications..  Also complained of wheezing, productive cough. Recent hospitalization 8/27-8/29 for acute pulm embolism and dysphagia.  On admission her heart rate was elevated to 116. Chest x-ray showed right lower lobe opacity, positive volume loss and mediastinal retraction to the right side. Admitted to hospitalist service for progressive dysphagia and respiratory failure GI and pulmonary consultations were obtained.  During the course of hospitalization, patient got an EGD done with release of esophageal obstruction.  However with poor appetite and recurrent feeling of dysphagia, she needed a PEG tube placement.  She has also been started on radiation treatment which will continue till 9/29. Palliative care consultation following for pain management.  She had to be on PCA pump for a few days, was taken off yesterday 9/26.  Currently mentating better on oxycodone.  Subjective: Patient was seen and examined this morning.  Lying down in bed.  Feels better today.  Tube feeding going at 50 mill per hour.  Off PCA pump for last 2 days.  On oxycodone.   Daughter at bedside.    Assessment/Plan: Esophageal stenosis -Patient presented with progressive dysphagia for several days  -Underwent EGD on 9/13, noted to have external compression from progressive lung cancer.  Was not able to pass scope.   -She had repeat EGD and esophageal dilatation done on 9/15.  Esophageal biopsy showed fibrotic stroma and no intrinsic malignancy. -PEG tube placed on 9/21.   Tube feeding was started.  Nutrition consult appreciated.  Currently on 50 mill per hour of tube feeding.  Currently on full liquid diet..  Advance as tolerated.  Patient would like to try some applesauce today. Recent Labs  Lab 08/24/21 1229 08/24/21 1705 08/25/21 0350 08/25/21 1546 08/27/21 0322 08/28/21 0333 08/29/21 0424 08/30/21 0425  K  --   --   --   --  3.8 3.8 4.2 4.4  MG 2.1 2.1 2.2 2.0 2.2  --   --   --   PHOS 3.1 3.1 3.2 3.4 3.9  --   --   --     Acute respiratory failure with hypoxia -Currently on room air at rest.  Ambulatory oxygen requirement check yesterday.  Qualifies for home oxygen.  Order placed.    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  -CT chest 09/15 with tumor progression in the posterior right lower lobe  -Patient also is getting radiation treatment.  Noted a plan to continue radiation till Thursday next week.  Per radiation oncology, it can be done as an outpatient as well if patient is otherwise stable for discharge. -Pulmonology offered bronchoscopy for sampling and molecular testing.  Patient prefers to do it outpatient.. -Currently not on supplemental oxygen.  Monitor oxygen level on ambulation.  Cancer related pain -Highly appreciate palliative care involvement in her pain control. -Currently on fentanyl patch 150 mcg every 72 hours, oral oxycodone.  Dilaudid PCA pump was stopped 2 days ago.  Defer to palliative care for pain medicine at this  point. -Also on Decadron 4 mg IV daily, plan is to gradually taper it off.  Hx of pulmonary embolism -On Eliquis.  Essential hypertension -Heart rate and blood pressure stable on Coreg 12.5 mg twice daily.    Severe calorie protein malnutrition -Nutrition consult appreciated.  Currently has tube feeding running through PEG tube.    Mobility: Encourage ambulation.  Advised nurse to take her for a  walk today on the hallway.  Check oxygen protocol. Code Status:   Code Status: Full Code  Nutritional status: Body mass index is 31.66 kg/m. Nutrition Problem: Severe Malnutrition Etiology: acute illness, dysphagia Signs/Symptoms: percent weight loss, energy intake < or equal to 50% for > or equal to 5 days Percent weight loss: 7 % Diet:  Diet Order             Diet full liquid Room service appropriate? Yes; Fluid consistency: Thin  Diet effective now                  DVT prophylaxis:  Place and maintain sequential compression device Start: 08/19/21 0918 apixaban (ELIQUIS) tablet 5 mg   Antimicrobials: None Fluid: Not on IV fluid Consultants: Pulmonology, oncology, radiation oncology Family Communication: Husband at bedside today.  Status is: Inpatient  Remains inpatient appropriate because: Palliative radiation till Thursday 9/29.  Plan is to discharge her home tomorrow.  She has an appointment with Dr. Julien Nordmann on Friday 9/30. Dispo: The patient is from: Home              Anticipated d/c is to: Home likely tomorrow              Patient currently is not medically stable to d/c.   Difficult to place patient No     Infusions:   feeding supplement (OSMOLITE 1.5 CAL) 1,000 mL (08/30/21 1308)   lactated ringers 0 mL (08/15/21 1915)    Scheduled Meds:  apixaban  5 mg Per Tube BID   bisacodyl  10 mg Rectal Once   carvedilol  12.5 mg Per Tube BID WC   dexamethasone (DECADRON) injection  4 mg Intravenous Q24H   feeding supplement (PROSource TF)  45 mL Per Tube TID   fentaNYL  3 patch Transdermal Q48H   fentaNYL (SUBLIMAZE) injection  50 mcg Intravenous NOW   free water  100 mL Per Tube Q4H   methylnaltrexone  12 mg Subcutaneous Q48H   metoCLOPramide (REGLAN) injection  10 mg Intravenous Q8H   ondansetron  4 mg Per Tube Q6H   oxyCODONE  10 mg Per Tube Q6H   pantoprazole sodium  40 mg Per Tube Daily   senna-docusate  1 tablet Per Tube QHS   thiamine  100 mg Per Tube  Daily   traZODone  50 mg Per Tube QHS    Antimicrobials: Anti-infectives (From admission, onward)    Start     Dose/Rate Route Frequency Ordered Stop   08/22/21 1500  vancomycin (VANCOCIN) IVPB 1000 mg/200 mL premix        1,000 mg 200 mL/hr over 60 Minutes Intravenous To Radiology 08/21/21 1328 08/23/21 1735       PRN meds: acetaminophen (TYLENOL) oral liquid 160 mg/5 mL, benzonatate, bisacodyl, cyclobenzaprine, guaiFENesin, hydrALAZINE, ipratropium-albuterol, labetalol, LORazepam, magic mouthwash w/lidocaine, ondansetron **OR** ondansetron (ZOFRAN) IV, oxyCODONE, polyethylene glycol   Objective: Vitals:   08/30/21 0201 08/30/21 0538  BP:  122/89  Pulse:  83  Resp:  12  Temp:  98.2 F (36.8 C)  SpO2: 96% 98%  Intake/Output Summary (Last 24 hours) at 08/30/2021 1525 Last data filed at 08/29/2021 1800 Gross per 24 hour  Intake 835.16 ml  Output --  Net 835.16 ml    Filed Weights   08/27/21 0457 08/29/21 0412 08/30/21 0500  Weight: 82.9 kg 85.8 kg 86.3 kg   Weight change: 0.5 kg Body mass index is 31.66 kg/m.   Physical Exam: General exam: Pleasant, middle-aged Caucasian female.  Lying down in bed.  Feels better.   Skin: No rashes, lesions or ulcers. HEENT: Atraumatic, normocephalic, no obvious bleeding Lungs: Clear to auscultation bilaterally CVS: Tachycardia improving, no murmur.   GI/Abd: soft, nontender, nondistended, bowel sound present CNS: Alert, awake, oriented x3 Psychiatry: Mood appropriate today Extremities: Bilateral pedal edema improved, no calf tenderness  Data Review: I have personally reviewed the laboratory data and studies available.  Recent Labs  Lab 08/27/21 0322 08/28/21 0333 08/29/21 0424 08/30/21 0425  WBC 5.4 8.0 9.5 9.3  NEUTROABS 4.9 7.5 8.7* 8.7*  HGB 13.3 13.6 12.1 12.3  HCT 40.1 40.9 36.1 36.8  MCV 92.6 91.1 91.4 91.8  PLT 188 193 183 172    Recent Labs  Lab 08/24/21 1229 08/24/21 1705 08/25/21 0350 08/25/21 1546  08/27/21 0322 08/28/21 0333 08/29/21 0424 08/30/21 0425  NA  --   --   --   --  139 132* 133* 138  K  --   --   --   --  3.8 3.8 4.2 4.4  CL  --   --   --   --  97* 93* 93* 97*  CO2  --   --   --   --  32 28 33* 32  GLUCOSE  --   --   --   --  152* 139* 116* 146*  BUN  --   --   --   --  24* 26* 27* 26*  CREATININE  --   --   --   --  0.74 0.72 0.86 0.84  CALCIUM  --   --   --   --  8.8* 8.5* 8.4* 9.2  MG 2.1 2.1 2.2 2.0 2.2  --   --   --   PHOS 3.1 3.1 3.2 3.4 3.9  --   --   --      F/u labs ordered Unresulted Labs (From admission, onward)     Start     Ordered   08/30/21 0500  CBC with Differential/Platelet  Daily,   R     Question:  Specimen collection method  Answer:  Lab=Lab collect   08/29/21 1619   08/30/21 2130  Basic metabolic panel  Daily,   R     Question:  Specimen collection method  Answer:  Lab=Lab collect   08/29/21 1619   08/29/21 2217  Urine Culture  Once,   R       Question:  Indication  Answer:  Urgency/frequency   08/29/21 2217            Signed, Terrilee Croak, MD [facility] 08/30/2021

## 2021-08-30 NOTE — Progress Notes (Signed)
SATURATION QUALIFICATIONS: (This note is used to comply with regulatory documentation for home oxygen)  Patient Saturations on Room Air at Rest = 90%  Patient Saturations on Room Air while Ambulating = 84%  Patient Saturations on 2 Liters of oxygen while Ambulating = 94%  Please briefly explain why patient needs home oxygen: The patient ambulated from her bed into the restroom and oxygen saturation assessed 84 % ra 2 lt applied [place], o2 sat increased 94 %.

## 2021-08-30 NOTE — Progress Notes (Signed)
Nutrition Follow-up  DOCUMENTATION CODES:   Severe malnutrition in context of acute illness/injury  INTERVENTION:  - increase Osmolite 1.5 to goal rate of 60 ml/hr with 45 ml Prosource TF TID and 100 ml free water every 4 hours. - this regimen will provide 2280 kcal, 123 grams protein, and 1697 ml free water.   - if tolerating goal rate will transition to bolus TF regimen 9/29. - will communicate with [facility] RDs concerning patient.   NUTRITION DIAGNOSIS:   Severe Malnutrition related to acute illness, dysphagia as evidenced by percent weight loss, energy intake < or equal to 50% for > or equal to 5 days. -ongoing  GOAL:   Patient will meet greater than or equal to 90% of their needs -will be met with TF regimen at goal rate.  MONITOR:   TF tolerance, PO intake, Diet advancement, Labs, Weight trends, I & O's  ASSESSMENT:   60 y.o. female with medical history of stage 4 metastatic lung cancer on chemo, PE on Eliquis, dysphagia, and recent hospitalization from 8/27-8/29 for acute PE and dysphagia. She presented to the ED due to progressive dysphagia. GI consulted.  Patient sitting in the chair with husband and daughter-in-law in the room. Patient ate part of a yogurt last night and feels that she ate it too quickly as she felt very mucousy after. She has been taking slow, small bites of a yogurt this AM and reports this is going well.   She states that she had some slight nausea last night but feels this was related to suspicion that UTI is beginning as urine has been foul smelling and it burns when she urinates. She also reports that pain is much better controlled than it has been.   She was able to dress herself and brush her hair this morning which has been encouraging to her.   She is currently receiving Osmolite 1.5 @ 50 ml/hr with 45 ml Prosource TF TID and 100 ml free water every 4 hours. She is receptive to increasing to goal rate of 60 ml/hr today with plan for  transition to bolus regimen 9/29 if goal rate is well tolerated.  Briefly discussed with patient and family what transition to bolus will look like, volume of TF needed/day, and that RD will communicate with RDs at the [facility].  Weight has steadily increased during admission. She is noted to have non-pitting edema to all extremities. She is noted to be +14.3 L since admission.   Labs reviewed; CBGs: 160, 122, 135 mg/dl, Cl: 97 mmol/l, BUN: 26 mg/dl. K today: 4.4 mmol/l; Mg on 9/25: 2.2 mg/dl, Phos on 9/25: 3.9 mg/dl.  Medications reviewed; 10 mg IV reglan TID, 40 mg protonix per PEG/day, 1 tablet senokot per PEG/day, 100 mg thiamine per PEG/day.     Diet Order:   Diet Order             Diet full liquid Room service appropriate? Yes; Fluid consistency: Thin  Diet effective now                   EDUCATION NEEDS:   Not appropriate for education at this time  Skin:  Skin Assessment: Reviewed RN Assessment  Last BM:  9/27 (type 6 x1)  Height:   Ht Readings from Last 1 Encounters:  08/15/21 5' 5"  (1.651 m)    Weight:   Wt Readings from Last 1 Encounters:  08/30/21 86.3 kg     Estimated Nutritional Needs:  Kcal:  2200-2440 kcal Protein:  110-130 grams Fluid:  >/= 2.3 L/day     Jarome Matin, MS, RD, LDN, CNSC Inpatient Clinical Dietitian RD pager # available in [facility]  After hours/weekend pager # available in [facility]

## 2021-08-30 NOTE — Progress Notes (Signed)
Call received from patient's husband stating that she was in severe pain after radiation. I evaluated her at bedside. Moserate distress. +Nausea, Vomiting and now has diarrhea. Patient also appears very anxious. She is worried about whether the radiation is working or not. Provided information and acknowledged difficulty of uncertainty.  Recommendations:  Change Fentanyl patch to q48 hours Give 1X dose of IV Fentanyl 86mcg She will benefit from Klonopin low dose 0.25 BID per tube  Planning on discharge home tomorrow- she may elect to not have her next radiation tx.  Lane Hacker, DO Palliative Medicine

## 2021-08-31 ENCOUNTER — Ambulatory Visit: Admission: RE | Admit: 2021-08-31 | Payer: 59 | Source: Ambulatory Visit

## 2021-08-31 ENCOUNTER — Telehealth: Payer: Self-pay | Admitting: Internal Medicine

## 2021-08-31 ENCOUNTER — Other Ambulatory Visit (HOSPITAL_COMMUNITY): Payer: Self-pay

## 2021-08-31 LAB — CBC WITH DIFFERENTIAL/PLATELET
Abs Immature Granulocytes: 0.07 10*3/uL (ref 0.00–0.07)
Basophils Absolute: 0 10*3/uL (ref 0.0–0.1)
Basophils Relative: 0 %
Eosinophils Absolute: 0 10*3/uL (ref 0.0–0.5)
Eosinophils Relative: 0 %
HCT: 34.3 % — ABNORMAL LOW (ref 36.0–46.0)
Hemoglobin: 11.6 g/dL — ABNORMAL LOW (ref 12.0–15.0)
Immature Granulocytes: 1 %
Lymphocytes Relative: 2 %
Lymphs Abs: 0.1 10*3/uL — ABNORMAL LOW (ref 0.7–4.0)
MCH: 30.8 pg (ref 26.0–34.0)
MCHC: 33.8 g/dL (ref 30.0–36.0)
MCV: 91 fL (ref 80.0–100.0)
Monocytes Absolute: 0.4 10*3/uL (ref 0.1–1.0)
Monocytes Relative: 5 %
Neutro Abs: 6.9 10*3/uL (ref 1.7–7.7)
Neutrophils Relative %: 92 %
Platelets: 176 10*3/uL (ref 150–400)
RBC: 3.77 MIL/uL — ABNORMAL LOW (ref 3.87–5.11)
RDW: 13.2 % (ref 11.5–15.5)
WBC: 7.4 10*3/uL (ref 4.0–10.5)
nRBC: 0 % (ref 0.0–0.2)

## 2021-08-31 LAB — BASIC METABOLIC PANEL
Anion gap: 6 (ref 5–15)
BUN: 24 mg/dL — ABNORMAL HIGH (ref 8–23)
CO2: 30 mmol/L (ref 22–32)
Calcium: 8.5 mg/dL — ABNORMAL LOW (ref 8.9–10.3)
Chloride: 97 mmol/L — ABNORMAL LOW (ref 98–111)
Creatinine, Ser: 0.89 mg/dL (ref 0.44–1.00)
GFR, Estimated: >60 mL/min (ref >60)
Glucose, Bld: 134 mg/dL — ABNORMAL HIGH (ref 70–99)
Potassium: 4.6 mmol/L (ref 3.5–5.1)
Sodium: 133 mmol/L — ABNORMAL LOW (ref 135–145)

## 2021-08-31 LAB — URINE CULTURE: Culture: >=100000 — AB

## 2021-08-31 LAB — GLUCOSE, CAPILLARY
Glucose-Capillary: 128 mg/dL — ABNORMAL HIGH (ref 70–99)
Glucose-Capillary: 117 mg/dL — ABNORMAL HIGH (ref 70–99)
Glucose-Capillary: 108 mg/dL — ABNORMAL HIGH (ref 70–99)

## 2021-08-31 MED ORDER — CARVEDILOL 12.5 MG PO TABS
12.5000 mg | ORAL_TABLET | Freq: Two times a day (BID) | ORAL | 0 refills | Status: AC
  Filled 2021-08-31: qty 60, 30d supply, fill #0

## 2021-08-31 MED ORDER — PANTOPRAZOLE SODIUM 40 MG PO TBEC
40.0000 mg | DELAYED_RELEASE_TABLET | Freq: Every day | ORAL | 1 refills | Status: DC
  Filled 2021-08-31: qty 30, 30d supply, fill #0

## 2021-08-31 MED ORDER — FENTANYL 50 MCG/HR TD PT72
3.0000 | MEDICATED_PATCH | TRANSDERMAL | 0 refills | Status: AC
  Filled 2021-08-31: qty 10, 30d supply, fill #0

## 2021-08-31 MED ORDER — OSMOLITE 1.5 CAL PO LIQD
60.0000 mL | Freq: Every day | ORAL | Status: DC
  Filled 2021-08-31 (×9): qty 237

## 2021-08-31 MED ORDER — OMEPRAZOLE 2 MG/ML ORAL SUSPENSION
40.0000 mg | Freq: Every day | ORAL | 0 refills | Status: AC
  Filled 2021-08-31 – 2021-09-01 (×2): qty 600, 30d supply, fill #0

## 2021-08-31 MED ORDER — CIPROFLOXACIN HCL 500 MG PO TABS
500.0000 mg | ORAL_TABLET | Freq: Two times a day (BID) | ORAL | Status: DC
  Administered 2021-08-31: 500 mg via ORAL
  Filled 2021-08-31: qty 1

## 2021-08-31 MED ORDER — OMEPRAZOLE 2 MG/ML ORAL SUSPENSION
40.0000 mg | Freq: Every day | ORAL | 0 refills | Status: DC
  Filled 2021-08-31: qty 600, 30d supply, fill #0

## 2021-08-31 MED ORDER — APIXABAN 5 MG PO TABS: 5.0000 mg | ORAL_TABLET | Freq: Two times a day (BID) | ORAL | 0 refills | Status: AC

## 2021-08-31 MED ORDER — THIAMINE HCL 100 MG PO TABS
100.0000 mg | ORAL_TABLET | Freq: Every day | ORAL | 1 refills | Status: AC
  Filled 2021-08-31: qty 30, 30d supply, fill #0

## 2021-08-31 MED ORDER — NALOXEGOL OXALATE 12.5 MG PO TABS
12.5000 mg | ORAL_TABLET | Freq: Every day | ORAL | 3 refills | Status: AC
  Filled 2021-08-31: qty 30, 30d supply, fill #0

## 2021-08-31 MED ORDER — BISACODYL 10 MG RE SUPP
10.0000 mg | Freq: Every day | RECTAL | 0 refills | Status: AC | PRN
  Filled 2021-08-31: qty 12, 12d supply, fill #0

## 2021-08-31 MED ORDER — CLONAZEPAM 0.5 MG PO TABS
0.2500 mg | ORAL_TABLET | Freq: Two times a day (BID) | ORAL | 0 refills | Status: AC
  Filled 2021-08-31: qty 30, 30d supply, fill #0

## 2021-08-31 MED ORDER — FREE WATER: 100.0000 mL | Freq: Four times a day (QID) | 2 refills | Status: DC

## 2021-08-31 MED ORDER — PROSOURCE TF PO LIQD: 45.0000 mL | Freq: Two times a day (BID) | ORAL | 1 refills | Status: AC

## 2021-08-31 MED ORDER — FREE WATER: 120.0000 mL | Freq: Every day | Status: DC

## 2021-08-31 MED ORDER — TRAZODONE HCL 50 MG PO TABS
50.0000 mg | ORAL_TABLET | Freq: Every day | ORAL | 3 refills | Status: AC
  Filled 2021-08-31: qty 30, 30d supply, fill #0

## 2021-08-31 MED ORDER — OXYCODONE HCL 5 MG/5ML PO SOLN
10.0000 mg | ORAL | 0 refills | Status: AC | PRN
  Filled 2021-08-31: qty 900, 15d supply, fill #0

## 2021-08-31 MED ORDER — ONDANSETRON HCL 4 MG/5ML PO SOLN
4.0000 mg | Freq: Four times a day (QID) | ORAL | 0 refills | Status: DC
  Filled 2021-08-31: qty 600, 30d supply, fill #0

## 2021-08-31 MED ORDER — ACETAMINOPHEN 160 MG/5ML PO SOLN
650.0000 mg | Freq: Four times a day (QID) | ORAL | 0 refills | Status: AC | PRN
  Filled 2021-08-31: qty 120, 2d supply, fill #0

## 2021-08-31 MED ORDER — CIPROFLOXACIN HCL 500 MG PO TABS
500.0000 mg | ORAL_TABLET | Freq: Two times a day (BID) | ORAL | 0 refills | Status: AC
  Filled 2021-08-31: qty 10, 5d supply, fill #0

## 2021-08-31 MED ORDER — ONDANSETRON 4 MG PO TBDP
4.0000 mg | ORAL_TABLET | Freq: Four times a day (QID) | ORAL | 0 refills | Status: AC
  Filled 2021-08-31: qty 18, 4d supply, fill #0

## 2021-08-31 MED ORDER — DEXAMETHASONE 1 MG PO TABS
1.0000 mg | ORAL_TABLET | Freq: Every day | ORAL | 0 refills | Status: AC
  Filled 2021-08-31: qty 30, 30d supply, fill #0

## 2021-08-31 MED ORDER — CYCLOBENZAPRINE HCL 10 MG PO TABS
10.0000 mg | ORAL_TABLET | Freq: Two times a day (BID) | ORAL | 0 refills | Status: AC | PRN
  Filled 2021-08-31: qty 30, 15d supply, fill #0

## 2021-08-31 MED ORDER — FREE WATER: 120.0000 mL | Freq: Every day | 1 refills | Status: AC

## 2021-08-31 MED ORDER — OSMOLITE 1.5 CAL PO LIQD: 237.0000 mL | Freq: Every day | ORAL | 1 refills | Status: AC

## 2021-08-31 MED ORDER — OXYCODONE HCL 20 MG/ML PO CONC
10.0000 mg | ORAL | 0 refills | Status: AC | PRN
  Filled 2021-08-31: qty 90, 15d supply, fill #0

## 2021-08-31 MED ORDER — POLYETHYLENE GLYCOL 3350 17 GM/SCOOP PO POWD
17.0000 g | Freq: Every day | ORAL | 1 refills | Status: AC
  Filled 2021-08-31: qty 238, 14d supply, fill #0

## 2021-08-31 MED ORDER — SENNOSIDES-DOCUSATE SODIUM 8.6-50 MG PO TABS
2.0000 | ORAL_TABLET | Freq: Every day | ORAL | 1 refills | Status: AC
  Filled 2021-08-31: qty 60, 30d supply, fill #0

## 2021-08-31 MED ORDER — PROSOURCE TF PO LIQD
45.0000 mL | Freq: Two times a day (BID) | ORAL | Status: DC
  Filled 2021-08-31: qty 45

## 2021-08-31 MED ORDER — ALBUTEROL SULFATE HFA 108 (90 BASE) MCG/ACT IN AERS
2.0000 | INHALATION_SPRAY | Freq: Four times a day (QID) | RESPIRATORY_TRACT | 2 refills | Status: AC | PRN
  Filled 2021-08-31: qty 8.5, 25d supply, fill #0

## 2021-08-31 NOTE — Evaluation (Signed)
Physical Therapy Evaluation Patient Details Name: Brittany Mccarty MRN: [id number] DOB: [birth year] Today's Date: 08/31/2021  History of Present Illness  Brittany Mccarty is a 60 y.o. female with PMH significant for metastatic lung cancer, pulmonary embolism.  Patient presented to the ED on 9/12 with complaint of progressive dysphagia to a point where it is difficult even to swallow the medications..  Also complained of wheezing, productive cough.  Recent hospitalization 8/27-8/29 for acute pulm embolism and dysphagia, S/P PEG  Clinical Impression   See saturation walk test note. Patient ambulates well with rollator, Patient's spouse and daughter  in room. Recommended to pace activity and  ambulate . Recommended a shower seat once able to shower(Has a PEG). Patient has a pulse oximeter at home. No further PT in acute acute care. PT will sign off. Plans Dc today.      Recommendations for follow up therapy are one component of a multi-disciplinary discharge planning process, led by the attending physician.  Recommendations may be updated based on patient status, additional functional criteria and insurance authorization.  Follow Up Recommendations No PT follow up    Equipment Recommendations   (4 wheeled RW.)    Recommendations for Other Services       Precautions / Restrictions Precautions Precautions: Fall Precaution Comments: monitor sats PEG     Mobility  Bed Mobility               General bed mobility comments: in recliner    Transfers   Equipment used: 4-wheeled walker                Ambulation/Gait Ambulation/Gait assistance: Supervision Gait Distance (Feet): 20 Feet (then 100') Assistive device: 4-wheeled walker Gait Pattern/deviations: Step-through pattern     General Gait Details: gait steady and slow  Stairs            Wheelchair Mobility    Modified Rankin (Stroke Patients Only)       Balance     Sitting balance-Leahy Scale:  Good     Standing balance support: No upper extremity supported;During functional activity Standing balance-Leahy Scale: Fair                               Pertinent Vitals/Pain Pain Assessment: No/denies pain    Home Living Family/patient expects to be discharged to:: Private residence   Available Help at Discharge: Family;Available 24 hours/day Type of Home: House Home Access: Stairs to enter   [employer] of Steps: stoop to enter   Home Equipment: None      Prior Function Level of Independence: Independent               Hand Dominance   Dominant Hand: Right    Extremity/Trunk Assessment   Upper Extremity Assessment Upper Extremity Assessment: Overall WFL for tasks assessed    Lower Extremity Assessment Lower Extremity Assessment: Generalized weakness    Cervical / Trunk Assessment Cervical / Trunk Assessment: Normal  Communication   Communication: No difficulties  Cognition Arousal/Alertness: Awake/alert Behavior During Therapy: WFL for tasks assessed/performed Overall Cognitive Status: Within Functional Limits for tasks assessed                                        General Comments      Exercises     Assessment/Plan    PT Assessment  All further PT needs can be met in the next venue of care  PT Problem List Cardiopulmonary status limiting activity;Decreased activity tolerance       PT Treatment Interventions      PT Goals (Current goals can be found in the Care Plan section)  Acute Rehab PT Goals Patient Stated Goal: to go home today PT Goal Formulation: All assessment and education complete, DC therapy    Frequency     Barriers to discharge        Co-evaluation               AM-PAC PT "6 Clicks" Mobility  Outcome Measure Help needed turning from your back to your side while in a flat bed without using bedrails?: None Help needed moving from lying on your back to sitting on the  side of a flat bed without using bedrails?: None Help needed moving to and from a bed to a chair (including a wheelchair)?: None Help needed standing up from a chair using your arms (e.g., wheelchair or bedside chair)?: None Help needed to walk in hospital room?: A Little Help needed climbing 3-5 steps with a railing? : A Little 6 Click Score: 22    End of Session Equipment Utilized During Treatment: Oxygen Activity Tolerance: Patient tolerated treatment well Patient left: in chair;with call bell/phone within reach;with family/visitor present Nurse Communication: Mobility status PT Visit Diagnosis: Muscle weakness (generalized) (M62.81)    Time: 3893-7342 PT Time Calculation (min) (ACUTE ONLY): 53 min   Charges:   PT Evaluation $PT Eval Low Complexity: 1 Low PT Treatments $Gait Training: 8-22 mins $Self Care/Home Management: [place] Pager [phone] Office [phone]   Claretha Cooper 08/31/2021, 1:21 PM

## 2021-08-31 NOTE — Progress Notes (Signed)
NUTRITION NOTE  Patient is currently receiving Osmolite 1.5 at goal rate of 60 ml/hr with 45 ml Prosource TF TID and 100 ml free water every 4 hours. This regimen is providing 2280 kcal, 123 grams protein, and 1697 ml free water.  Patient had radiation yesterday late afternoon. Following radiation she experienced severe nausea, multiple episodes of vomiting, had an episode of diarrhea, and reports that she had severe pain every 2 hours throughout the night which impacted ability to sleep.  She is feeling well this AM. Patient is planned to go home today. Orders placed for bolus TF regimen to start prior to discharge to ensure tolerance:  60 ml Osmolite 1.5 x6/day with 45 ml Prosource TF BID and 60 ml free water before and 60 ml free water after each TF bolus.    Once home, recommend the following regimen:  6AM: 60 ml free water, 60 ml Osmolite 1.5, 60 ml free water  9AM: 60 ml free water, 60 ml Osmolite 1.5, 30 ml Prosource Plus (or equivalent, mixed with 30 ml water), 60 ml free water  12PM: 60 ml free water, 60 ml Osmolite 1.5, 60 ml free water  3PM: 60 ml free water, 60 ml Osmolite 1.5, 60 ml free water  6PM: 60 ml free water, 60 ml Osmolite 1.5, 30 ml Prosource Plus (or equivalent, mixed with 30 ml water), 60 ml free water  9PM: 60 ml free water, 60 ml Osmolite 1.5, 60 ml free water   Goal will be for 237 ml (8 oz, 1 carton) Osmolite 1.5 at each of these feedings. Volume of Osmolite 1.5 given at each feeding to be increased as patient feels ready/is able to tolerate.   Goal regimen is for Osmolite 1.5 is 1422 ml (48 oz, 6 cartons) per day with 30 ml Prosource Plus (or equivalent) BID and 60 ml free water before and 60 ml free water after each TF bolus.  Goal regimen will provide 2330 kcal, 119 grams protein, and 1806 ml free water.   Estimated Nutritional Needs:  Kcal:  2200-2440 kcal Protein:  110-130 grams Fluid:  >/= 2.3 L/day    Jarome Matin, MS, RD, LDN,  CNSC Inpatient Clinical Dietitian RD pager # available in [facility]  After hours/weekend pager # available in [facility]

## 2021-08-31 NOTE — Progress Notes (Signed)
SATURATION QUALIFICATIONS: (This note is used to comply with regulatory documentation for home oxygen)  Patient Saturations on Room Air at Rest = 96%  Patient Saturations on Room Air while Ambulating = 83%  Patient Saturations on 2 Liters of oxygen while Ambulating = 96%  Please briefly explain why patient needs home oxygen:to maintain oxygen saturation  during ambulation. Tresa Endo PT Acute Rehabilitation Services Pager [phone] Office [phone]

## 2021-08-31 NOTE — Telephone Encounter (Signed)
Scheduled per sch msg. Called and spoke with patient. Confirmed appt  

## 2021-08-31 NOTE — TOC Progression Note (Signed)
Transition of Care [facility]) - Progression Note    Patient Details  Name: Brittany Mccarty MRN: [id number] Date of Birth: 28-Aug-1961  [facility] CM/SW Contact  Purcell Mouton, RN Phone Number: 08/31/2021, 12:38 PM  Clinical Narrative:    Spoke with pt concerning discharge home with Home Health and DME. Referral given to in house rep with [employer] for DME. [employer] request pt discharge with 3 days of tube feed formula. RN is aware. Referral for Home Health given to [place]  in house rep.    Expected Discharge Plan: [place] Barriers to Discharge: No Barriers Identified  Expected Discharge Plan and Services Expected Discharge Plan: [place] arrangements for the past 2 months: Single Family Home Expected Discharge Date: 08/31/21               DME Arranged: Gilford Rile rolling with seat, Tube feeding DME Agency: [employer] Date DME Agency Contacted: 08/31/21 Time DME Agency Contacted: [id number] Representative spoke with at DME Agency: Thedore Mins HH Arranged: RN, PT [place] Agency: [facility] (now [facility]) Date [place]: 08/31/21 Time [place]: [id number] Representative spoke with at [place]: [place] ([place]) Interventions    Readmission Risk Interventions No flowsheet data found.

## 2021-08-31 NOTE — Progress Notes (Signed)
Brittany Mccarty to be D/C'd Home with home health per MD order.  Discussed with the patient and all questions fully answered.  IV catheter discontinued intact. Site without signs and symptoms of complications. Dressing and pressure applied.  An After Visit Summary was printed and given to the patient. Patient prescriptions sent to pharmacy. Three day supply of tube feed and supplies sent with patient.  D/c education completed with patient/family including follow up instructions, medication list, d/c activities limitations if indicated, with other d/c instructions as indicated by MD - patient able to verbalize understanding, all questions fully answered.   Patient instructed to return to ED, call 911, or call MD for any changes in condition.   Patient escorted via [place], and D/C home via private auto.  Manuella Ghazi 08/31/2021 5:17 PM

## 2021-09-01 ENCOUNTER — Other Ambulatory Visit (HOSPITAL_COMMUNITY): Payer: Self-pay

## 2021-09-01 ENCOUNTER — Telehealth: Payer: Self-pay

## 2021-09-01 NOTE — Discharge Summary (Signed)
[facility]  Physician Discharge Summary   Patient ID: Brittany Mccarty MRN: [id number] DOB/AGE: 03-26-1961 61 y.o.  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 Palliative care to arrange follow-up with palliative care in the outpatient setting   Home Health: None Equipment/Devices: Home oxygen  CODE STATUS: Full code  DISCHARGE CONDITION: fair  Diet recommendation: Currently on tube feedings  INITIAL HISTORY: 60 y.o. female with PMH significant for metastatic lung cancer, pulmonary embolism. Patient presented to the ED on 9/12 with complaint of progressive dysphagia to a point where it is difficult even to swallow the medications..  Also complained of wheezing, productive cough. Recent hospitalization 8/27-8/29 for acute pulm embolism and dysphagia. On admission her heart rate was elevated to 116. Chest x-ray showed right lower lobe opacity, positive volume loss and mediastinal retraction to the right side. Admitted to hospitalist service for progressive dysphagia and respiratory failure GI and pulmonary consultations were obtained. During the course of hospitalization, patient got an EGD done with release of esophageal obstruction.  However with poor appetite and recurrent feeling of dysphagia, she needed a PEG tube placement.  She has also been started on radiation treatment which will continue till 9/29. Palliative care consultation following for pain management.  She had to be on PCA pump for a few days, was taken off yesterday 9/26.   Currently mentating better on oxycodone.  Consultations: Pulmonology, oncology, radiation oncology  Procedures:  EGD - Normal upper third of esophagus. - Benign-appearing esophageal stenosis. Dilated. - Z-line regular, 35 cm from the incisors. - Normal stomach. - Normal examined duodenum. - No specimens collected.  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  Was not able to pass scope.   -She had repeat EGD and esophageal dilatation done on 9/15.  Esophageal biopsy showed fibrotic stroma and no intrinsic malignancy. -PEG tube placed on 9/21.  Tube feeding was started.  Initially given continuous feedings.  To be transition to bolus feeding on discharge.     Acute respiratory failure with hypoxia Requiring oxygen especially with exertion when she drops her saturations into the mid 80s.   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  -CT chest 09/15 with tumor progression in the posterior right lower lobe  Patient was getting radiation treatment here in the hospital.  She apparently has been finding it very difficult to tolerate these treatments with development of nausea and vomiting.  Declined for loss treatment. -Pulmonology offered bronchoscopy for sampling and molecular testing.  Patient prefers to do it outpatient.. Seen by her medical oncologist as well.  Further discussions regarding treatment options to be pursued in the outpatient setting.   Cancer related pain Seen by palliative care for pain control issues.  Currently on fentanyl patch as and oxycodone.  Was on Dilaudid PCA pump for a few days while in the hospital.  Was also on dexamethasone which is also  being  continued per palliative care at discharge.   Hx of pulmonary embolism -On Eliquis.   Essential hypertension Stable.  Continue home medications   Severe calorie protein malnutrition Nutrition Problem: Severe Malnutrition Etiology: acute illness, dysphagia  Signs/Symptoms: percent weight loss, energy intake < or equal to 50% for > or equal to 5 days Percent weight loss: 7 %  Interventions: Tube feeding, Prostat   Patient wants to go home today.  Discussed with palliative care who will prescribe her pain medications.  Okay for discharge otherwise.  Home oxygen has been ordered as well.  PERTINENT LABS:  The results of significant diagnostics from this hospitalization (including imaging, microbiology, ancillary and laboratory) are listed below for reference.    Microbiology: Recent Results (from the past 240 hour(s))  Urine Culture     Status: Abnormal   Collection Time: 08/29/21 11:40 PM   Specimen: Urine, Clean Catch  Result Value Ref Range Status   Specimen Description   Final    URINE, CLEAN CATCH Performed at [facility], [place] [address], [place], [place] [address]    Special Requests   Final    NONE Performed at [facility], [place] [address], [place],  [address]    Culture >=100,000 COLONIES/mL PROTEUS MIRABILIS (A)  Final   Report Status 08/31/2021 FINAL  Final   Organism ID, Bacteria PROTEUS MIRABILIS (A)  Final      Susceptibility   Proteus mirabilis - MIC*    AMPICILLIN <=2 SENSITIVE Sensitive     CEFAZOLIN 8 SENSITIVE Sensitive     CEFEPIME <=0.12 SENSITIVE Sensitive     CEFTRIAXONE <=0.25 SENSITIVE Sensitive     CIPROFLOXACIN <=0.25 SENSITIVE Sensitive     GENTAMICIN <=1 SENSITIVE Sensitive     IMIPENEM 4 SENSITIVE Sensitive     NITROFURANTOIN RESISTANT Resistant     TRIMETH/SULFA <=20 SENSITIVE Sensitive     AMPICILLIN/SULBACTAM <=2 SENSITIVE Sensitive     PIP/TAZO <=4 SENSITIVE Sensitive     * >=100,000  COLONIES/mL PROTEUS MIRABILIS     Labs:  COVID-19 Labs   Lab Results  Component Value Date   SARSCOV2NAA NEGATIVE 08/14/2021   [place] NEGATIVE 07/30/2021   [place] NEGATIVE 04/24/2020      Basic Metabolic Panel: Recent Labs  Lab 08/25/21 1546 08/27/21 0322 08/28/21 0333 08/29/21 0424 08/30/21 0425 08/31/21 0350  NA  --  139 132* 133* 138 133*  K  --  3.8 3.8 4.2 4.4 4.6  CL  --  97* 93* 93* 97* 97*  CO2  --  32 28 33* 32 30  GLUCOSE  --  152* 139* 116* 146* 134*  BUN  --  24* 26* 27* 26* 24*  CREATININE  --  0.74 0.72 0.86 0.84 0.89  CALCIUM  --  8.8* 8.5* 8.4* 9.2 8.5*  MG 2.0 2.2  --   --   --   --   PHOS 3.4 3.9  --   --   --   --     CBC: Recent Labs  Lab 08/27/21 0322 08/28/21 0333 08/29/21 0424 08/30/21 0425 08/31/21 0350  WBC 5.4 8.0 9.5 9.3 7.4  NEUTROABS 4.9 7.5 8.7* 8.7* 6.9  HGB 13.3 13.6 12.1 12.3 11.6*  HCT 40.1 40.9 36.1 36.8 34.3*  MCV 92.6 91.1 91.4 91.8 91.0  PLT 188 193 183 172 176    BNP: BNP (last 3 results) Recent Labs    08/17/21 0846  BNP 83.1    CBG: Recent Labs  Lab 08/30/21 1632 08/30/21 2030 08/31/21 0444 08/31/21 0728 08/31/21 1117  GLUCAP 116* 121* 128* 117* 108*     IMAGING STUDIES CT ABDOMEN WO CONTRAST  Result Date: 08/21/2021 CLINICAL DATA:  60 year old female referred for possible gastrostomy tube placement EXAM: CT ABDOMEN WITHOUT CONTRAST TECHNIQUE: Multidetector CT imaging of the abdomen was performed following the standard protocol without IV contrast. COMPARISON:  Chest CT 08/16/2021, abdominal CT 07/19/2021 FINDINGS: Lower chest: Small left-sided pleural effusion and trace right-sided pleural effusion. Consolidative airspace disease at the base of the right lung, with volume loss of the visualized right middle lobe, right lower lobe. Pattern lymphatic involvement includes interlobular septal thickening, thickening of the visualized fissure. Ground-glass opacity within the residually aerated  lung at the right lung base. Debris within the bronchi of the medial right lower lobe. Calcifications in the right infrahilar region. The appearance is similar to chest CT performed 08/16/2021, however, there has been progression of volume loss of the right middle lobe. Hepatobiliary: Hypodensity within segment 4B adjacent to the falciform ligament, appears increased in size from the prior comparison CTs, now 2.9 cm on image 31 of series 2 high density material within the gallbladder. No inflammatory changes. Pancreas: Unremarkable Spleen: Multiple punctate calcifications within spleen parenchyma, compatible with granulomatous infection. Adrenals/Urinary Tract: - Right adrenal gland:  Unremarkable - Left adrenal gland: Unremarkable. - Right kidney: No hydronephrosis, nephrolithiasis, inflammation, or ureteral dilation. No focal lesion. - Left Kidney: No hydronephrosis, nephrolithiasis, inflammation, or ureteral dilation. No focal lesion. Stomach/Bowel: Visualized stomach unremarkable. Visualized small bowel decompressed with no focal inflammatory changes. Visualized colon unremarkable. Vascular/Lymphatic: No significant vascular calcifications. Edema within the leaves of the retroperitoneum, as well as edema within the body wall and the mesenteric fat most likely representing positive fluid balance. Trace fluid adjacent to the lesser curvature of the stomach. Other: None Musculoskeletal: No acute displaced fracture.  Degenerative changes. IMPRESSION: No acute finding within the abdomen. There is questionable enlargement of focal hypodensity in segment 4B of the liver adjacent to the falciform ligament, poorly characterized on the noncontrast CT. While this may represent expansion of focal fatty change, a metastatic lesion can not be excluded. Further evaluation with imaging could be performed with either abdominal ultrasound and/or outpatient MRI (outpatient to maximize sensitivity/specificity). Similar appearance  airspace and lymphatic disease of the right lower lobe, with increasing volume loss of the right middle lobe. As was previously stated, differential includes infection, recurrence/progressed malignancy, as well as background post treatment changes. Malignancy is of specific concern given the lymphatic disease of the right lower lobe and appearance of carcinomatosis. Small left pleural effusion with mesenteric and body wall edema, most compatible with positive fluid balance Additional ancillary findings as above. Electronically Signed   By: Corrie Mckusick D.O.   On: 08/21/2021 12:10   DG Chest 2 View  Result Date: 08/14/2021 CLINICAL DATA:  Cough, dysphagia, shortness of breath EXAM: CHEST - 2 VIEW COMPARISON:  07/29/2021 FINDINGS: The heart size and mediastinal contours are within normal limits. Unchanged post treatment appearance of the right chest with volume loss of the right hemithorax and a small right pleural effusion. No new airspace opacity. The visualized skeletal structures are unremarkable. IMPRESSION: Unchanged post treatment appearance of the right chest with volume loss of the right hemithorax and a small right pleural effusion. No new airspace opacity. Electronically Signed   By: Eddie Candle M.D.   On: 08/14/2021 12:52   DG Abd 1 View  Result Date: 08/19/2021 CLINICAL DATA:  Constipation, abdominal pain EXAM: ABDOMEN - 1 VIEW COMPARISON:  CT abdomen/pelvis 07/19/2021 FINDINGS: There is a nonobstructive bowel gas pattern. There is a mild stool burden in the rectum and right colon. There is no gross organomegaly or abnormal soft tissue calcification. There is no acute osseous abnormality. IMPRESSION: Nonobstructive bowel gas pattern; mild stool burden as above. Electronically Signed   By: Valetta Mole M.D.   On: 08/19/2021 11:39   CT CHEST W CONTRAST  Result Date: 08/16/2021 CLINICAL DATA:  Cough, persistent lung mass.  Dysphagia.  A EXAM: CT CHEST WITH CONTRAST TECHNIQUE: Multidetector CT  imaging of the chest was performed during intravenous contrast administration. CONTRAST:  43mL OMNIPAQUE IOHEXOL 350 MG/ML SOLN COMPARISON:  CT angiogram chest 07/30/2021. FINDINGS: Cardiovascular: Heart size is within normal limits. The aorta is normal in size. There is no pericardial effusion. Right lower lobe pulmonary arteries diminutive, unchanged. Mediastinum/Nodes: The visualized thyroid gland is within normal limits. 2.3 cm partially calcified right paratracheal lymph node appears unchanged from the prior examination. The esophagus is nondilated. Lungs/Pleura: Again seen is ill-defined right lower lobe parenchymal opacity/mass extending to the right hilum measuring 5.7 x 4.0 cm image 2/86 which appears similar to the prior examination. There is cut off of rhonchi at this level, unchanged. There is prominence of right hilar soft tissue, ill-defined, unchanged from the prior examination. There is new right middle lobe airspace disease and right upper lobe airspace disease extending to the right hilum containing air bronchograms. Additionally, there is increasing ground-glass and airspace disease throughout the right lung apex. There continues to be volume loss on the right. There is a small right pleural effusion which is new from the prior examination. There is likely mild diffuse pleural thickening there is mild diffuse pleural thickening on the right, unchanged. Scattered parenchymal calcifications are seen in the right lung. Focal ground-glass/nodular density in the left upper lobe measuring 7 mm has minimally increased when compared to the prior study. There are scattered calcified granulomas throughout the left lung. Left lung is otherwise clear. Upper Abdomen: There are calcified granulomas in the spleen. Visualized upper abdomen is otherwise within normal limits. Musculoskeletal: Degenerative changes affect the spine. IMPRESSION: 1. Dense parenchymal opacity in the right lower lobe extending to the  right hilum appears unchanged from the prior examination (07/30/2021. Findings may related to post radiation changes, but superimposed infection or neoplasm not excluded. 2. There is new airspace disease in the right middle lobe and right upper lobe worrisome for infection. 3. There is a new small right pleural effusion. 4. Right pleural thickening is stable. 5. Ground-glass/nodular density in the left upper lobe measuring 7 mm has slightly increased in size. Follow-up non-contrast CT recommended at 3-6 months to confirm persistence. If unchanged, and solid component remains <6 mm, annual CT is recommended until 5 years of stability has been established. If persistent these nodules should be considered highly suspicious if the solid component of the nodule is 6 mm or greater in size and enlarging. This recommendation follows the consensus statement: Guidelines for Management of Incidental Pulmonary Nodules Detected on CT Images: From the Fleischner Society 2017; Radiology 2017; 284:228-243. Electronically Signed   By: Ronney Asters M.D.   On: 08/16/2021 19:03   MR THORACIC SPINE W WO CONTRAST  Result Date: 08/11/2021 CLINICAL DATA:  Back pain with cancer suspected EXAM: MRI THORACIC AND LUMBAR SPINE WITHOUT AND WITH CONTRAST TECHNIQUE: Multiplanar and multiecho pulse sequences of the thoracic and lumbar spine were obtained without and  with intravenous contrast. CONTRAST:  58mL GADAVIST GADOBUTROL 1 MMOL/ML IV SOLN COMPARISON:  None. FINDINGS: MRI THORACIC SPINE FINDINGS Alignment:  Slight C7-T1 to T2-3 anterolisthesis Vertebrae: No fracture, evidence of discitis, or bone lesion. Cord:  Normal signal and morphology. Paraspinal and other soft tissues: Post treatment changes in the right lung and mediastinum with volume loss. There has been recent staging CT. Small gallstones. No perispinal mass or inflammation. Disc levels: Small disc protrusions at T2-3 and T7-8, without neural compression. MRI LUMBAR SPINE  FINDINGS Segmentation:  5 lumbar type vertebrae Alignment:  Scoliosis and L5-S1 anterolisthesis. Vertebrae: Chronic right pars defect at L5. No acute fracture, discitis, or aggressive bone lesion Conus medullaris: Extends to the L1 level and appears normal. Paraspinal and other soft tissues: Negative for perispinal mass or inflammation. Partially covered fibroids. Disc levels: T12- L1: Small upward pointing disc protrusion L1-L2: Unremarkable. L2-L3: Disc narrowing and rightward bulging.  Negative facets L3-L4: Disc narrowing and bulging eccentric to the left. Asymmetric left facet spurring. L4-L5: Disc narrowing and bulging. Degenerative facet spurring on both sides. L5-S1:Chronic right L5 pars defect with anterolisthesis. The disc is mildly narrowed with right sided fissuring. Asymmetric right facet spurring with moderate right foraminal narrowing. IMPRESSION: 1. No evidence of metastatic disease to the thoracic or lumbar spine. 2. Lumbar scoliosis and degeneration with L5 chronic right pars defect and L5-S1 anterolisthesis. 3. L5-S1 moderate right foraminal stenosis. Electronically Signed   By: Jorje Guild M.D.   On: 08/11/2021 20:11   MR Lumbar Spine W Wo Contrast  Result Date: 08/11/2021 CLINICAL DATA:  Back pain with cancer suspected EXAM: MRI THORACIC AND LUMBAR SPINE WITHOUT AND WITH CONTRAST TECHNIQUE: Multiplanar and multiecho pulse sequences of the thoracic and lumbar spine were obtained without and with intravenous contrast. CONTRAST:  50mL GADAVIST GADOBUTROL 1 MMOL/ML IV SOLN COMPARISON:  None. FINDINGS: MRI THORACIC SPINE FINDINGS Alignment:  Slight C7-T1 to T2-3 anterolisthesis Vertebrae: No fracture, evidence of discitis, or bone lesion. Cord:  Normal signal and morphology. Paraspinal and other soft tissues: Post treatment changes in the right lung and mediastinum with volume loss. There has been recent staging CT. Small gallstones. No perispinal mass or inflammation. Disc levels: Small disc  protrusions at T2-3 and T7-8, without neural compression. MRI LUMBAR SPINE FINDINGS Segmentation:  5 lumbar type vertebrae Alignment:  Scoliosis and L5-S1 anterolisthesis. Vertebrae: Chronic right pars defect at L5. No acute fracture, discitis, or aggressive bone lesion Conus medullaris: Extends to the L1 level and appears normal. Paraspinal and other soft tissues: Negative for perispinal mass or inflammation. Partially covered fibroids. Disc levels: T12- L1: Small upward pointing disc protrusion L1-L2: Unremarkable. L2-L3: Disc narrowing and rightward bulging.  Negative facets L3-L4: Disc narrowing and bulging eccentric to the left. Asymmetric left facet spurring. L4-L5: Disc narrowing and bulging. Degenerative facet spurring on both sides. L5-S1:Chronic right L5 pars defect with anterolisthesis. The disc is mildly narrowed with right sided fissuring. Asymmetric right facet spurring with moderate right foraminal narrowing. IMPRESSION: 1. No evidence of metastatic disease to the thoracic or lumbar spine. 2. Lumbar scoliosis and degeneration with L5 chronic right pars defect and L5-S1 anterolisthesis. 3. L5-S1 moderate right foraminal stenosis. Electronically Signed   By: Jorje Guild M.D.   On: 08/11/2021 20:11   IR GASTROSTOMY TUBE MOD SED  Result Date: 08/24/2021 INDICATION: 60 year old female with history of severe dysphagia requiring percutaneous enteric access for supplemental nutrition. EXAM: PERC PLACEMENT GASTROSTOMY MEDICATIONS: Vancomycin 1 gm IV; Antibiotics were administered within 1 hour of  the procedure. ANESTHESIA/SEDATION: Versed 2 mg IV; Fentanyl 100 mcg IV Moderate Sedation Time:  11 The patient was continuously monitored during the procedure by the interventional radiology nurse under my direct supervision. CONTRAST:  23mL OMNIPAQUE IOHEXOL 300 MG/ML SOLN - administered into the gastric lumen. FLUOROSCOPY TIME:  Fluoroscopy Time: 0 minutes 36 seconds (1 mGy). COMPLICATIONS: None immediate.  PROCEDURE: Informed written consent was obtained from the patient after a thorough discussion of the procedural risks, benefits and alternatives. All questions were addressed. Maximal Sterile barrier Technique was utilized including caps, mask, sterile gowns, sterile gloves, sterile drape, hand hygiene and skin antiseptic. A timeout was performed prior to the initiation of the procedure. The patient was placed on the procedure table in the supine position. Pre-procedure abdominal film confirmed visualization of the transverse colon. An angled 5-French catheter was passed through the nares into the stomach. The patient was prepped and draped in usual sterile fashion. The stomach was insufflated with air via the indwelling nasogastric tube. Under fluoroscopy, a puncture site was selected and local analgesia achieved with 1% lidocaine infiltrated subcutaneously. Under fluoroscopic guidance, a gastropexy needle was passed into the stomach and the T-bar suture was released. Entry into the stomach was confirmed with fluoroscopy, aspiration of air, and injection of contrast material. This was repeated with an additional gastropexy suture (for a total of 2 fasteners). At the center of these gastropexy sutures, a dermatotomy was performed. An 18 gauge needle was passed into the stomach at the site of this dermatotomy, and position within the gastric lumen again confirmed under fluoroscopy using aspiration of air and contrast injection. An Amplatz guidewire was passed through this needle and intraluminal placement within the stomach was confirmed by fluoroscopy. The needle was removed. Over the guidewire, the percutaneous tract was dilated using a 10 mm non-compliant balloon. The balloon was deflated, then pushed into the gastric lumen followed in concert by the 20 Fr gastrostomy tube. The retention balloon of the percutaneous gastrostomy tube was inflated with 20 mL of sterile water. The tube was withdrawn until the  retention balloon was at the edge of the gastric lumen. The external bumper was brought to the abdominal wall. Contrast was injected through the gastrostomy tube, confirming intraluminal positioning. The patient tolerated the procedure well without any immediate post-procedural complications. IMPRESSION: Technically successful placement of 20 Fr gastrostomy tube. Ruthann Cancer, MD [facility] Radiology Electronically Signed   By: Ruthann Cancer M.D.   On: 08/24/2021 08:37   DG CHEST PORT 1 VIEW  Result Date: 08/20/2021 CLINICAL DATA:  Lung cancer.  Short of breath. EXAM: PORTABLE CHEST 1 VIEW COMPARISON:  CT 08/16/2021, chest radiograph 08/14/2021 FINDINGS: Now near complete opacification of the RIGHT hemithorax increased significantly comparison chest x-ray 6 days prior. Volume loss in the RIGHT hemithorax. LEFT mediastinal border normal. LEFT lung clear. IMPRESSION: Increasing opacification of the RIGHT hemithorax with near complete opacification increased significantly from radiograph and CT several days prior. Electronically Signed   By: Suzy Bouchard M.D.   On: 08/20/2021 17:45    DISCHARGE EXAMINATION: Vitals:   08/30/21 2037 08/31/21 0235 08/31/21 0450 08/31/21 0500  BP: (!) 132/99  (!) 142/91   Pulse: 90  92   Resp: 16  18   Temp: (!) 97.5 F (36.4 C)  98.1 F (36.7 C)   TempSrc: Oral  Oral   SpO2: 96% 97% 97%   Weight:    83.4 kg  Height:  General appearance: Awake alert.  In no distress Resp: Tachypneic at rest.  No use of accessory muscles.  Diminished air entry on the right.  Few crackles at left base. Cardio: S1-S2 is normal regular.  No S3-S4.  No rubs murmurs or bruit GI: Abdomen is soft.  Nontender nondistended.  Bowel sounds are present normal.  No masses organomegaly    DISPOSITION: Home  Discharge Instructions     Call MD for:  difficulty breathing, headache or visual disturbances   Complete by: As directed     Call MD for:  extreme fatigue   Complete by: As directed    Call MD for:  persistant dizziness or light-headedness   Complete by: As directed    Call MD for:  persistant nausea and vomiting   Complete by: As directed    Call MD for:  severe uncontrolled pain   Complete by: As directed    Call MD for:  temperature >100.4   Complete by: As directed    Discharge instructions   Complete by: As directed    Please be sure to follow-up with your oncologist for further management of your lung cancer.  Take your medications as prescribed.  You were cared for by a hospitalist during your hospital stay. If you have any questions about your discharge medications or the care you received while you were in the hospital after you are discharged, you can call the unit and asked to speak with the hospitalist on call if the hospitalist that took care of you is not available. Once you are discharged, your primary care physician will handle any further medical issues. Please note that NO REFILLS for any discharge medications will be authorized once you are discharged, as it is imperative that you return to your primary care physician (or establish a relationship with a primary care physician if you do not have one) for your aftercare needs so that they can reassess your need for medications and monitor your lab values. If you do not have a primary care physician, you can call [phone] for a physician referral.   Increase activity slowly   Complete by: As directed           Allergies as of 08/31/2021       Reactions   Other    no blood transfusions    Cefdinir Rash        Medication List     STOP taking these medications    lidocaine 2 % solution Commonly known as: XYLOCAINE   omeprazole 20 MG capsule Commonly known as: PRILOSEC Replaced by: omeprazole 2 mg/mL Susp oral suspension   ondansetron 4 MG tablet Commonly known as: ZOFRAN   osimertinib mesylate 80 MG tablet Commonly known as:  Tagrisso   oxyCODONE 5 MG immediate release tablet Commonly known as: Oxy IR/ROXICODONE Replaced by: oxyCODONE 20 MG/ML concentrated solution   predniSONE 20 MG tablet Commonly known as: DELTASONE   promethazine-codeine 6.25-10 MG/5ML syrup Commonly known as: PHENERGAN with CODEINE       TAKE these medications    acetaminophen 160 MG/5ML solution Commonly known as: TYLENOL Place 20.3 mLs (650 mg total) into feeding tube every 6 (six) hours as needed for mild pain, fever or headache.   albuterol 108 (90 Base) MCG/ACT inhaler Commonly known as: VENTOLIN HFA Inhale 2 puffs into the lungs every 6 (six) hours as needed for wheezing or shortness of breath.   apixaban 5 MG Tabs tablet Commonly known as: ELIQUIS Place 1  tablet (5 mg total) into feeding tube 2 (two) times daily. What changed: See the new instructions.   benzonatate 100 MG capsule Commonly known as: TESSALON Take 1 capsule (100 mg total) by mouth 3 (three) times daily as needed for cough.   bisacodyl 10 MG suppository Commonly known as: DULCOLAX Insert 1 suppository (10 mg total) rectally daily as needed for moderate constipation.   carvedilol 12.5 MG tablet Commonly known as: COREG Crush and place 1 tablet (12.5 mg total) into feeding tube 2 (two) times daily with a meal as directed.   ciprofloxacin 500 MG tablet Commonly known as: CIPRO Crush and insert 1 tablet (500 mg total) in tube  2 (two) times daily for 5 days.   clonazePAM 0.5 MG tablet Commonly known as: KLONOPIN Crush and place 1/2 tablet (0.25 mg total) in tube 2 (two) times daily.   cyclobenzaprine 10 MG tablet Commonly known as: FLEXERIL Crush and place 1 tablet (10 mg total) into feeding tube 2 (two) times daily as needed for muscle spasms. What changed: how to take this   dexamethasone 1 MG tablet Commonly known as: DECADRON Crush and place 1 tablet (1 mg total) in tube daily with breakfast.   feeding supplement (OSMOLITE 1.5 CAL)  Liqd Place 237 mLs into feeding tube 6 (six) times daily.   feeding supplement (PROSource TF) liquid Place 45 mLs into feeding tube 2 (two) times daily.   fentaNYL 50 MCG/HR Commonly known as: Whiteside 3 patches onto the skin every other day for 15 days. What changed:  how much to take when to take this   free water Soln Place 120 mLs into feeding tube 6 (six) times daily.   naloxegol oxalate 12.5 MG Tabs tablet Commonly known as: MOVANTIK Take 1 tablet (12.5 mg total) by mouth daily. For Constipation   omeprazole 2 mg/mL Susp oral suspension Commonly known as: FIRST-Omeprazole Place 20 mLs (40 mg total) into feeding tube daily. Replaces: omeprazole 20 MG capsule   ondansetron 4 MG disintegrating tablet Commonly known as: ZOFRAN-ODT Dissolve 1 tablet (4 mg total) in mouth every 6 (six) hours.   oxyCODONE 20 MG/ML concentrated solution Commonly known as: ROXICODONE INTENSOL Place 0.5-1 mLs (10-20 mg total) into feeding tube every 4 (four) hours as needed for up to 15 days for severe pain. Replaces: oxyCODONE 5 MG immediate release tablet   oxyCODONE 5 MG/5ML solution Commonly known as: ROXICODONE Take 10 mLs (10 mg total) by mouth every 4 (four) hours as needed for up to 15 days for severe pain or breakthrough pain.   polyethylene glycol powder 17 GM/SCOOP powder Commonly known as: GLYCOLAX/MIRALAX Mix 1 capful (17 grams) with water as directed and place into feeding tube daily.   senna-docusate 8.6-50 MG tablet Commonly known as: Senokot-S Place 2 tablets into feeding tube at bedtime.   thiamine 100 MG tablet Place 1 tablet (100 mg total) into feeding tube daily.   traZODone 50 MG tablet Commonly known as: DESYREL Crush and place 1 tablet (50 mg total) into feeding tube once a day at bedtime.          Follow-up Information     Curt Bears, MD Follow up in 1 week(s).   Specialty: Oncology Contact information: [place] [address] [phone]         [facility] Follow up.   Why: also known as [employer], your equipement for home is from here. Please call [employer] if you have any problems.  Contact information: [address] [place] [address] [phone]         [facility] Follow up.   Specialty: Home Health Services Why: [facility] is from this company. Please call them if you have any problems or concerns. Contact information: [address] [place] [address] [phone]                 TOTAL DISCHARGE TIME: 35 minutes  [facility] Pager on [email or web address]  09/01/2021, 12:34 PM

## 2021-09-01 NOTE — Telephone Encounter (Signed)
Patient notified of  completion of FMLA paperwork for family member health condition  as requested by Patient's Son.

## 2021-09-04 ENCOUNTER — Telehealth: Payer: Self-pay | Admitting: Medical Oncology

## 2021-09-04 ENCOUNTER — Other Ambulatory Visit (HOSPITAL_COMMUNITY): Payer: Self-pay

## 2021-09-04 ENCOUNTER — Telehealth: Payer: Self-pay | Admitting: Emergency Medicine

## 2021-09-04 NOTE — Telephone Encounter (Signed)
I left a message for the patient to call the office and see about changing her appointment due to CT not completed.  Office number given.

## 2021-09-04 NOTE — Progress Notes (Signed)
  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 She received a total dose of 28 Gy, but her prescription was for 31 Gy. She received 4 Gy on her first day as a mark and start approach, and the remainder of her treatments were 3 Gy per fraction.  Narrative: The patient tolerated radiation treatment relatively well but she declined to complete her last therapy.   Plan: The patient will receive a call in about one month from the radiation oncology department. She will continue follow up with Dr. Julien Nordmann as well.      Carola Rhine, PAC

## 2021-09-04 NOTE — Telephone Encounter (Signed)
HH referral for skilled nursing for PEG tube care and feedings.  I talked to [place] at [facility] and she said pt daughter  and dtr in law are RN and will help with administering PEG feeding.  I told Ok Edwards that pt is scheduled to see Dr Julien Nordmann tomorrow and he will address the referral.

## 2021-09-04 NOTE — Telephone Encounter (Signed)
She does not need to cancel her appointment.

## 2021-09-05 ENCOUNTER — Inpatient Hospital Stay: Payer: 59 | Admitting: Internal Medicine

## 2021-09-05 ENCOUNTER — Ambulatory Visit: Payer: 59 | Admitting: Emergency Medicine

## 2021-09-05 ENCOUNTER — Telehealth: Payer: Self-pay

## 2021-09-06 ENCOUNTER — Telehealth: Payer: Self-pay | Admitting: Medical Oncology

## 2021-09-06 NOTE — Telephone Encounter (Signed)
Pt died yesterday.

## 2021-09-07 ENCOUNTER — Other Ambulatory Visit (HOSPITAL_COMMUNITY): Payer: Self-pay

## 2021-09-14 LAB — GUARDANT 360

## 2021-10-03 NOTE — Telephone Encounter (Signed)
Pts husband LM stating pt won't be rescheduling her appts because they feel she is passing soon.

## 2021-10-03 DEATH — deceased

## 2022-05-07 IMAGING — CT CT CHEST W/ CM
2 of 4 series · 12 of 36 positions shown, 15 images · IV contrast (omnipaque)
Comparison: February 18, 2009.

CLINICAL DATA: Right PleurX drain in place. Swelling about the
PleurX catheter. Increasing shortness of breath. On chemotherapy.

EXAM:
CT CHEST, ABDOMEN, AND PELVIS WITH CONTRAST
TECHNIQUE: Multidetector CT imaging of the chest, abdomen and pelvis was
performed following the standard protocol during bolus
administration of intravenous contrast.
CONTRAST:  100mL OMNIPAQUE IOHEXOL 300 MG/ML  SOLN

[Series 2: cap with · axial · 0.78mm/px · z∈[-616,-86]mm · 9 of 128 slices shown, 12 images]
[im 11/128  mediastinal]
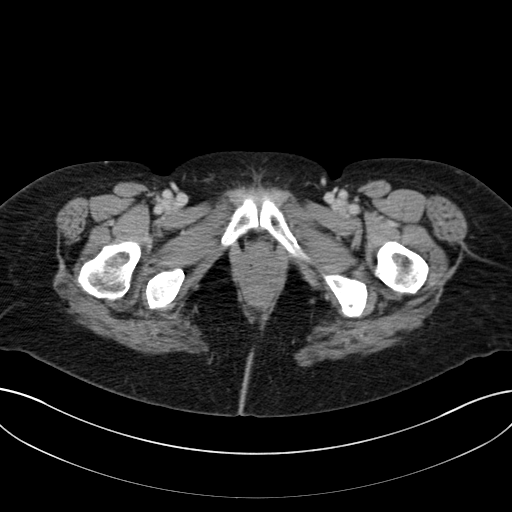
[im 11/128  lung]
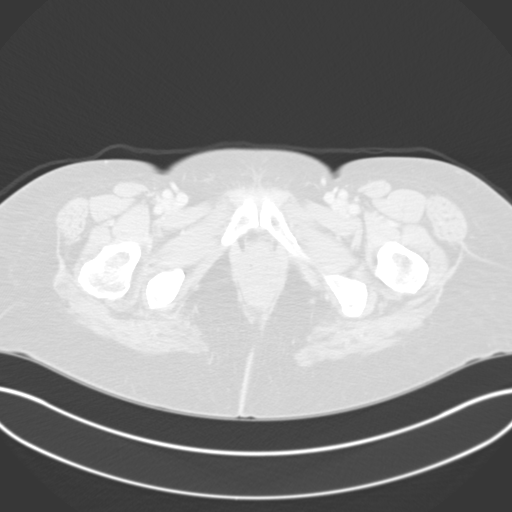
[im 22/128  lung]
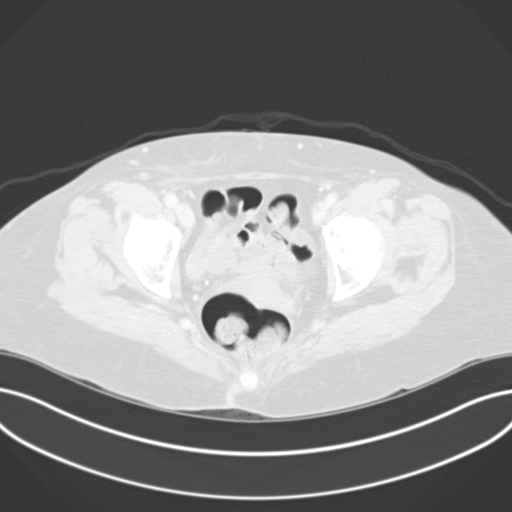
[im 43/128  lung]
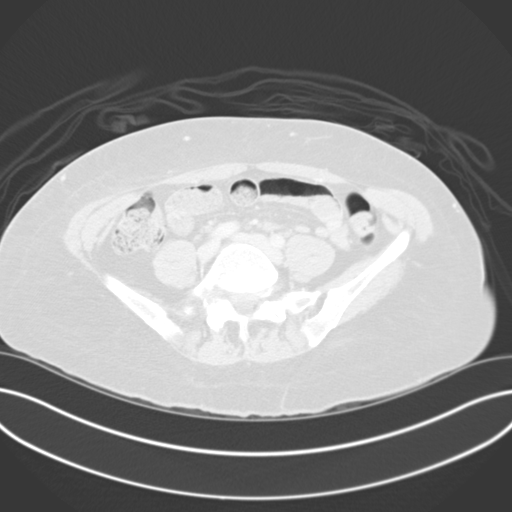
[im 53/128  lung]
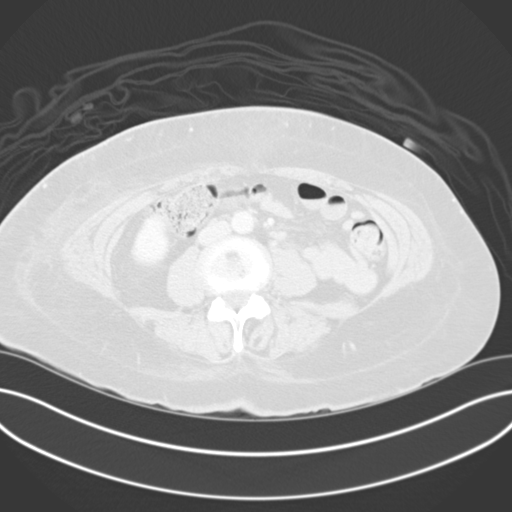
[im 64/128  mediastinal]
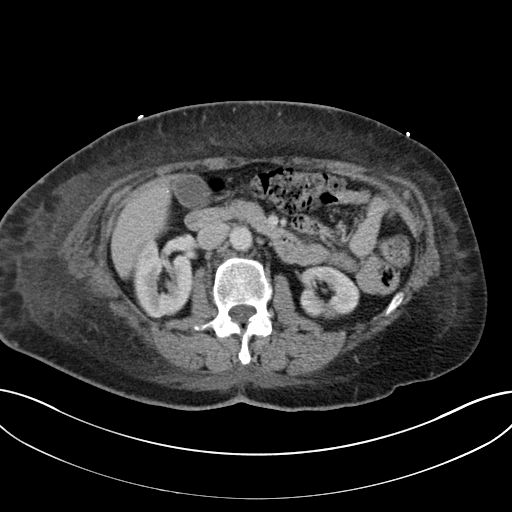
[im 64/128  lung]
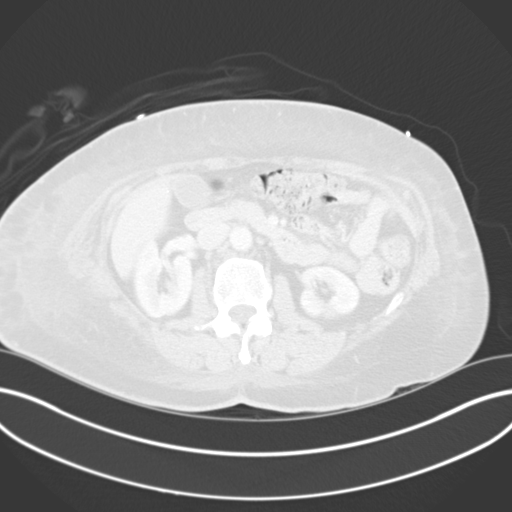
[im 75/128  lung]
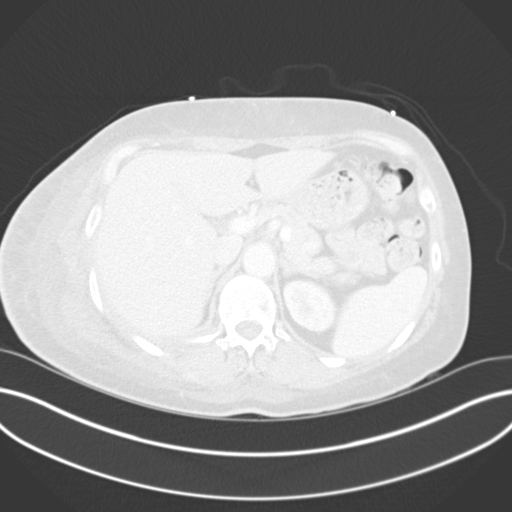
[im 85/128  lung]
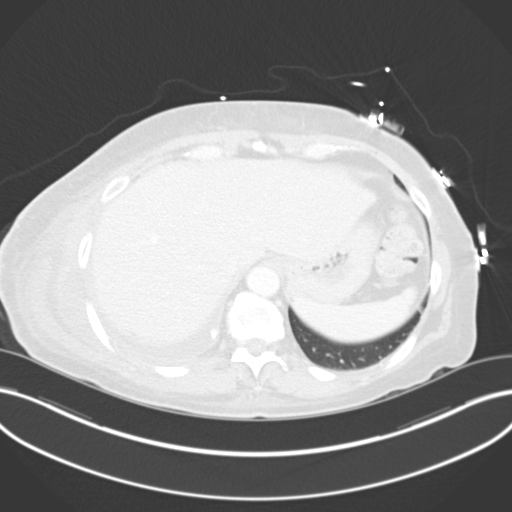
[im 106/128  lung]
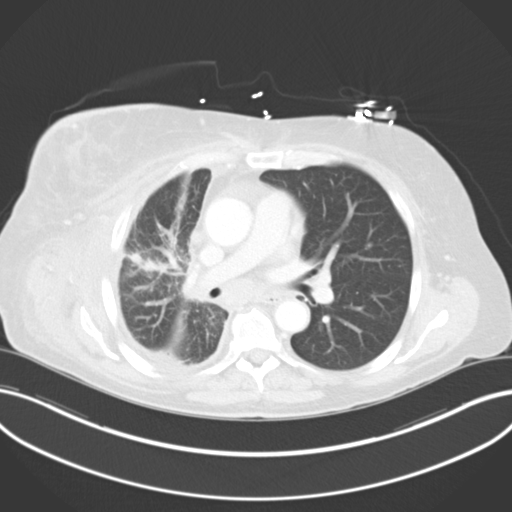
[im 117/128  mediastinal]
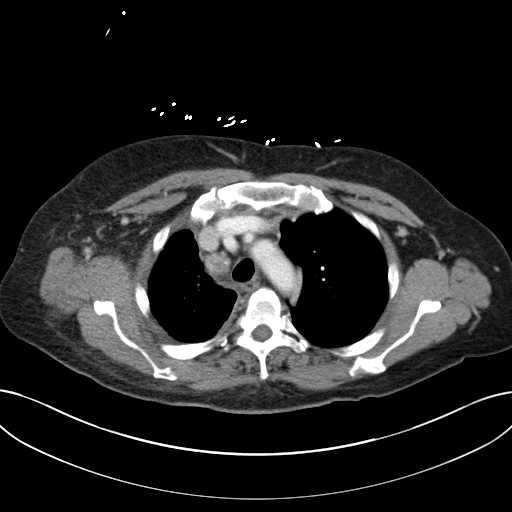
[im 117/128  lung]
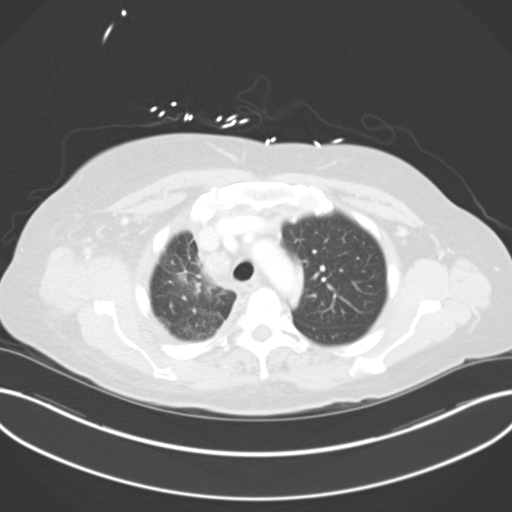

[Series 5: coronals · coronal · 0.82mm/px · 3 of 133 slices shown]
[im 27/133  lung]
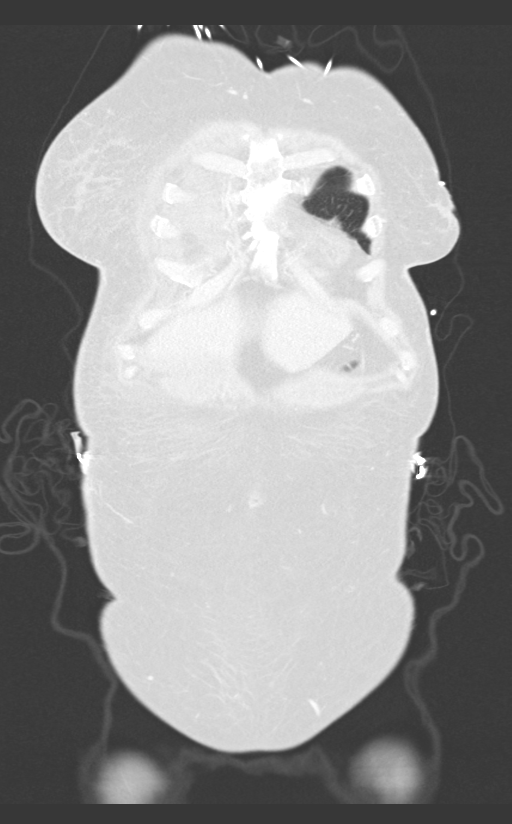
[im 53/133  lung]
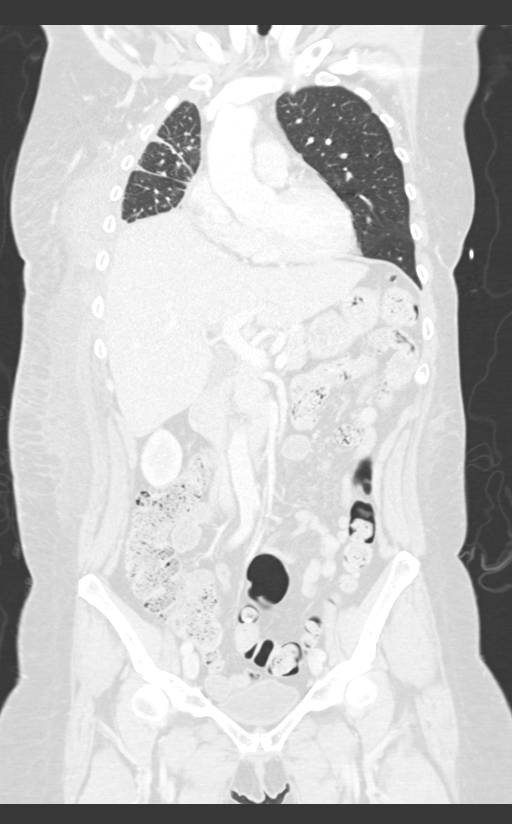
[im 80/133  lung]
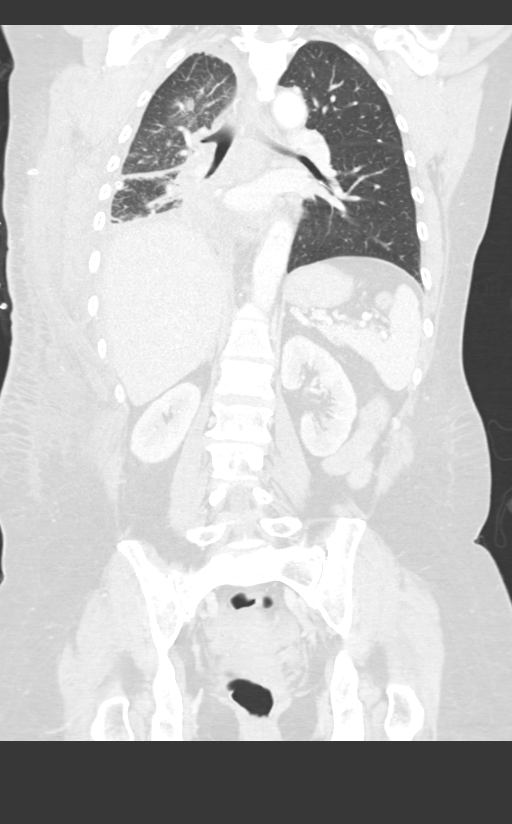

[12 of 36 positions shown; findings below may reference images not displayed]

FINDINGS: CT CHEST FINDINGS

Cardiovascular: The heart size is normal. There is a trace
pericardial effusion. There is no evidence for a thoracic aortic
dissection or aneurysm. No evidence for large centrally located
pulmonary embolism.

Mediastinum/Nodes:

--there is a centrally necrotic, peripherally calcified mass in the
mediastinum measuring approximately 2.6 cm. Has significantly
decreased in size since prior study in [phone]. There is an enlarged
subcarinal lymph node measuring approximately 1.7 cm.

--No axillary lymphadenopathy.

--No supraclavicular lymphadenopathy.

--Normal thyroid gland.

--The esophagus is unremarkable

Lungs/Pleura: There is an ill-defined right infrahilar mass
measuring approximately 5.5 x 4.5 cm (axial series 2, image 28).
There are small areas of consolidation and ground-glass airspace
opacities involving the bilateral upper lobes, right lower lobe, and
right middle lobe. There are pulmonary nodules in the left lower
lobe measuring up to approximately 7 mm (axial series 4, image 79).
There is some interlobular septal thickening involving the right
upper lobe. There is a small right-sided pleural effusion. There is
a right-sided PleurX catheter in place. One of the sideholes of the
PleurX catheter is located in the patient's chest wall. There is
fluid and gas within the patient's right chest wall without evidence
for a definite well-formed drainable collection or abscess there is
no right-sided pneumothorax.

Musculoskeletal: There is a small sclerotic lesion in the T7
vertebral body. There is no acute displaced fracture. There is
asymmetric right sided skin thickening involving the breast. This is
felt to be reactive.

Review of the MIP images confirms the above findings.

CT ABDOMEN PELVIS FINDINGS

Hepatobiliary: There is a small ill-defined hypodensity in hepatic
segment 4A/B (axial series 2, image 48). Normal gallbladder.There is
no biliary ductal dilation.

Pancreas: Normal contours without ductal dilatation. No
peripancreatic fluid collection.

Spleen: There is a hypoattenuating lesion in the spleen measuring
approximately 1.2 cm. This is indeterminate on this exam.

Adrenals/Urinary Tract:

--Adrenal glands: Unremarkable.

--Right kidney/ureter: No hydronephrosis or radiopaque kidney
stones.

--Left kidney/ureter: No hydronephrosis or radiopaque kidney stones.

--Urinary bladder: Unremarkable.

Stomach/Bowel:

--Stomach/Duodenum: No hiatal hernia or other gastric abnormality.
Normal duodenal course and caliber.

--Small bowel: Unremarkable.

--Colon: Unremarkable.

--Appendix: Normal.

Vascular/Lymphatic: Normal course and caliber of the major abdominal
vessels.

--No retroperitoneal lymphadenopathy.

--No mesenteric lymphadenopathy.

--No pelvic or inguinal lymphadenopathy.

Reproductive: Unremarkable

Other: No ascites or free air. Mild body wall edema.

Musculoskeletal. There is an area of sclerosis involving the L1
vertebral body, concerning for a metastatic lesion. There are
sclerotic lesions in the right iliac bone and right hemi sacrum
concerning for metastatic disease.
IMPRESSION: 1. Malpositioned right-sided PleurX catheter. One of the sideholes
is located within the patient's chest wall. There is free fluid
pockets of gas within the patient's right chest wall and right
flank. At this time, there is no well-formed drainable fluid
collection or abscess. While the fluid in pockets of gas may be
secondary to the malpositioned PleurX catheter, a developing
infectious process is within the differential diagnosis.
2. Small right-sided pleural effusion.  No pneumothorax.
3. Findings consistent with metastatic lung cancer including a right
infrahilar mass, multiple contralateral pulmonary nodules in the
left lung in addition to sclerotic osseous lesions as detailed
above. Correlation with the patient's prior outside imaging is
recommended. There are ill-defined lesions in the liver and spleen
which should be correlated with the patient's prior outside imaging.
4. Ground-glass airspace opacities with areas of consolidation are
noted in the right upper lobe and right middle lobe. These may
represent post treatment changes or developing pneumonia in the
appropriate clinical setting.

## 2022-05-08 IMAGING — DX DG CHEST 1V PORT
1 series · 1 of 1 positions shown · non-contrast
Comparison: 04/24/2020

CLINICAL DATA: Right pleural effusion

EXAM:
PORTABLE CHEST 1 VIEW

[chest ap]
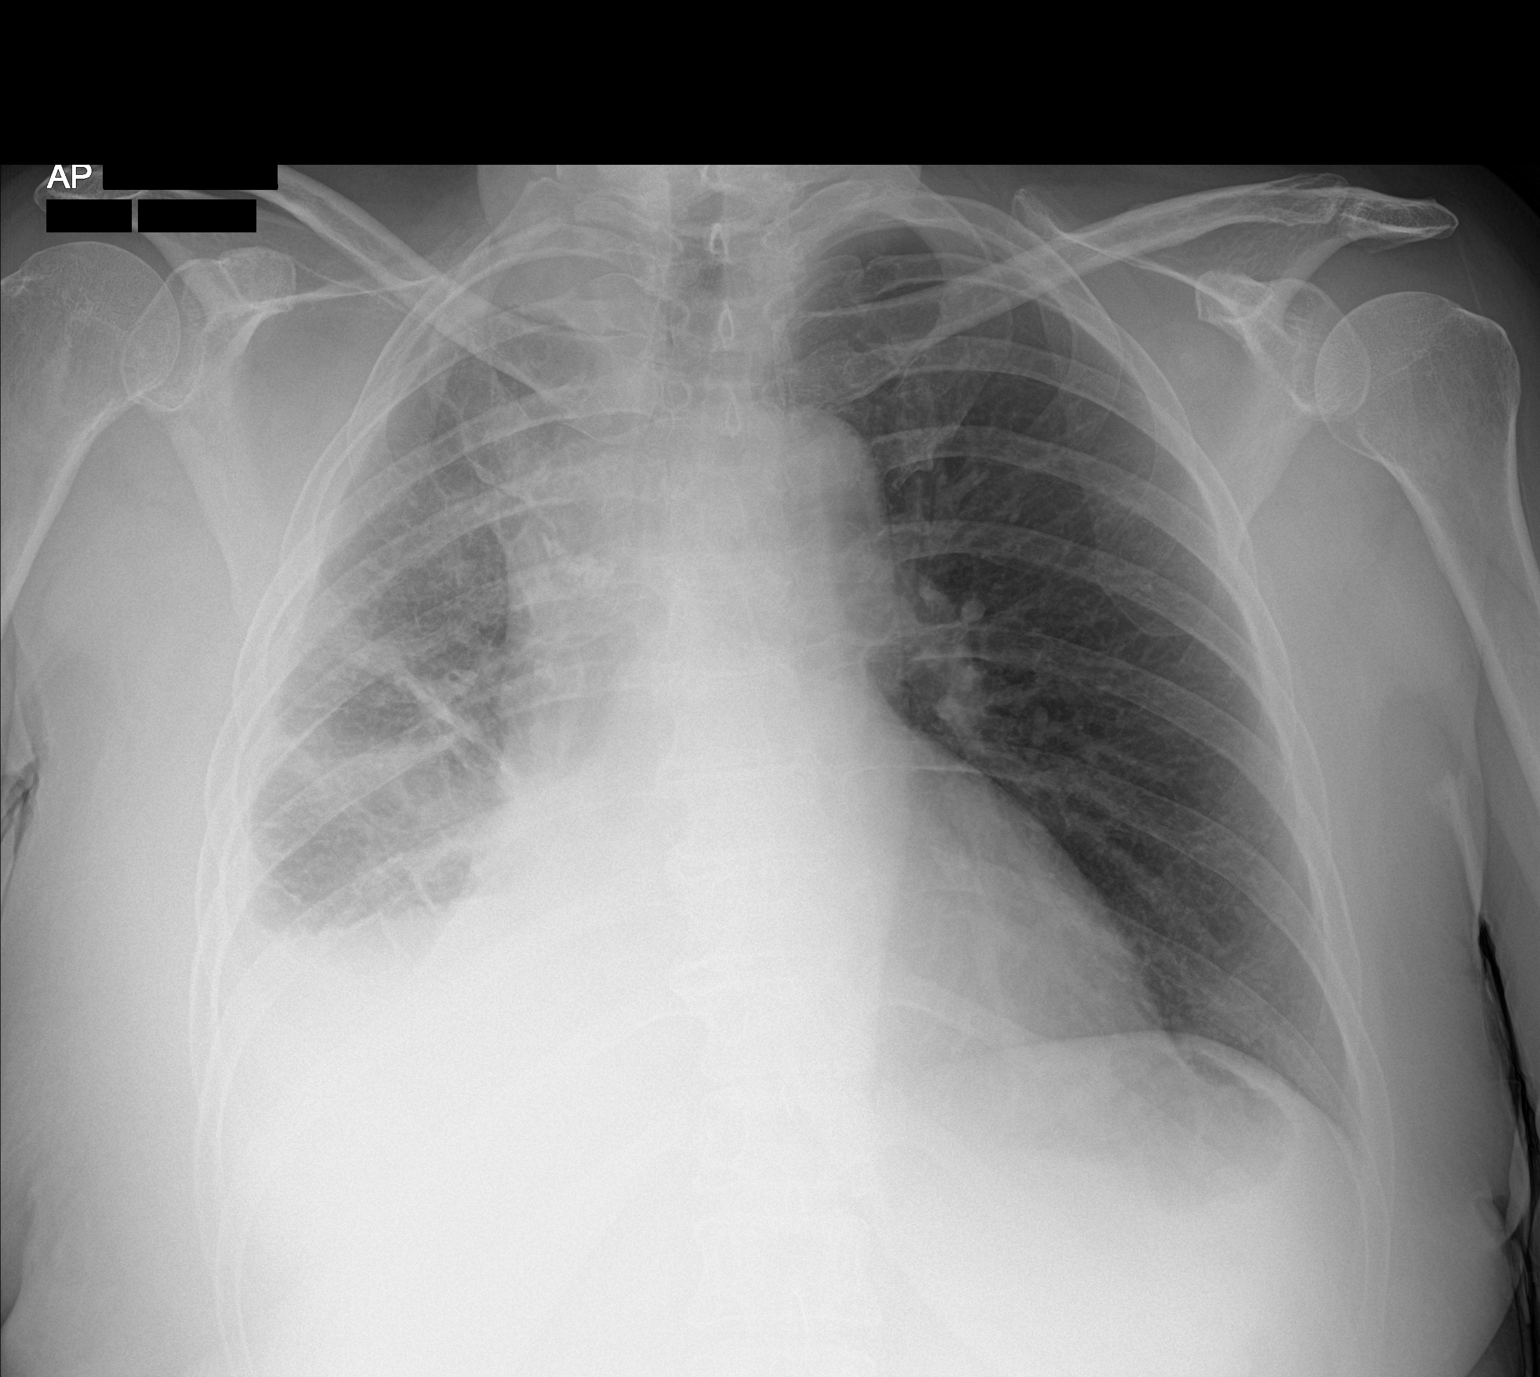

[1 of 1 positions shown; findings below may reference images not displayed]

FINDINGS: Cardiomegaly. Small right pleural effusion. Right lung airspace
disease again noted, unchanged. Left lung clear. No acute bony
abnormality.
IMPRESSION: Stable small right pleural effusion with right lung airspace
disease.

## 2023-06-25 ENCOUNTER — Other Ambulatory Visit (HOSPITAL_COMMUNITY): Payer: Self-pay

## 2023-09-12 ENCOUNTER — Other Ambulatory Visit (HOSPITAL_COMMUNITY): Payer: Self-pay
